# Patient Record
Sex: Female | Born: 1960 | Race: Black or African American | Hispanic: No | Marital: Married | Smoking: Never smoker
Health system: Southern US, Community
[De-identification: ages and names within clinical notes are randomized; demographics above are authoritative.]

## PROBLEM LIST (undated history)

## (undated) ENCOUNTER — Emergency Department (HOSPITAL_COMMUNITY): Disposition: A | Discharge status: Home / Self Care | Payer: 59

## (undated) DIAGNOSIS — I2699 Other pulmonary embolism without acute cor pulmonale: Secondary | ICD-10-CM

## (undated) DIAGNOSIS — I208 Other forms of angina pectoris: Secondary | ICD-10-CM

## (undated) DIAGNOSIS — R569 Unspecified convulsions: Secondary | ICD-10-CM

## (undated) DIAGNOSIS — K630 Abscess of intestine: Secondary | ICD-10-CM

## (undated) DIAGNOSIS — M5136 Other intervertebral disc degeneration, lumbar region: Secondary | ICD-10-CM

## (undated) DIAGNOSIS — Z531 Procedure and treatment not carried out because of patient's decision for reasons of belief and group pressure: Secondary | ICD-10-CM

## (undated) DIAGNOSIS — M4800 Spinal stenosis, site unspecified: Secondary | ICD-10-CM

## (undated) DIAGNOSIS — R319 Hematuria, unspecified: Secondary | ICD-10-CM

## (undated) DIAGNOSIS — R519 Headache, unspecified: Secondary | ICD-10-CM

## (undated) DIAGNOSIS — Z8719 Personal history of other diseases of the digestive system: Secondary | ICD-10-CM

## (undated) DIAGNOSIS — F419 Anxiety disorder, unspecified: Secondary | ICD-10-CM

## (undated) DIAGNOSIS — M533 Sacrococcygeal disorders, not elsewhere classified: Secondary | ICD-10-CM

## (undated) DIAGNOSIS — A4902 Methicillin resistant Staphylococcus aureus infection, unspecified site: Secondary | ICD-10-CM

## (undated) DIAGNOSIS — E119 Type 2 diabetes mellitus without complications: Secondary | ICD-10-CM

## (undated) DIAGNOSIS — I10 Essential (primary) hypertension: Secondary | ICD-10-CM

## (undated) DIAGNOSIS — R079 Chest pain, unspecified: Secondary | ICD-10-CM

## (undated) DIAGNOSIS — L4050 Arthropathic psoriasis, unspecified: Secondary | ICD-10-CM

## (undated) DIAGNOSIS — I722 Aneurysm of renal artery: Secondary | ICD-10-CM

## (undated) DIAGNOSIS — I2089 Other forms of angina pectoris: Secondary | ICD-10-CM

## (undated) DIAGNOSIS — R7303 Prediabetes: Secondary | ICD-10-CM

## (undated) DIAGNOSIS — K2970 Gastritis, unspecified, without bleeding: Secondary | ICD-10-CM

## (undated) DIAGNOSIS — M51369 Other intervertebral disc degeneration, lumbar region without mention of lumbar back pain or lower extremity pain: Secondary | ICD-10-CM

## (undated) DIAGNOSIS — R072 Precordial pain: Secondary | ICD-10-CM

## (undated) DIAGNOSIS — D649 Anemia, unspecified: Secondary | ICD-10-CM

## (undated) DIAGNOSIS — IMO0001 Reserved for inherently not codable concepts without codable children: Secondary | ICD-10-CM

## (undated) DIAGNOSIS — E041 Nontoxic single thyroid nodule: Secondary | ICD-10-CM

## (undated) HISTORY — DX: Other intervertebral disc degeneration, lumbar region without mention of lumbar back pain or lower extremity pain: M51.369

## (undated) HISTORY — PX: CARDIAC CATHETERIZATION: SHX172

## (undated) HISTORY — DX: Other forms of angina pectoris: I20.8

## (undated) HISTORY — DX: Other pulmonary embolism without acute cor pulmonale: I26.99

## (undated) HISTORY — DX: Other forms of angina pectoris: I20.89

## (undated) HISTORY — DX: Other intervertebral disc degeneration, lumbar region: M51.36

## (undated) HISTORY — DX: Spinal stenosis, site unspecified: M48.00

## (undated) HISTORY — DX: Aneurysm of renal artery: I72.2

## (undated) HISTORY — DX: Abscess of intestine: K63.0

## (undated) HISTORY — PX: JOINT REPLACEMENT: SHX530

## (undated) HISTORY — PX: TOTAL SHOULDER ARTHROPLASTY: SHX126

## (undated) HISTORY — PX: LAPAROSCOPIC OOPHERECTOMY: SHX6507

## (undated) HISTORY — PX: TUBAL LIGATION: SHX77

## (undated) HISTORY — PX: ABDOMINAL HYSTERECTOMY: SHX81

## (undated) HISTORY — PX: CHOLECYSTECTOMY: SHX55

---

## 2009-04-27 HISTORY — PX: LAPAROSCOPIC ENDOMETRIOSIS FULGURATION: SUR769

## 2012-04-27 HISTORY — PX: LAPAROSCOPIC HYSTERECTOMY: SHX1926

## 2016-04-27 HISTORY — PX: LAPAROSCOPIC GASTRIC SLEEVE RESECTION: SHX5895

## 2017-07-26 HISTORY — PX: CYSTECTOMY: SUR359

## 2017-07-27 ENCOUNTER — Encounter: Payer: Self-pay | Admitting: Rheumatology

## 2017-08-19 ENCOUNTER — Encounter: Payer: Self-pay | Admitting: Rheumatology

## 2017-10-01 DIAGNOSIS — E6609 Other obesity due to excess calories: Secondary | ICD-10-CM | POA: Diagnosis not present

## 2017-10-01 DIAGNOSIS — Z6832 Body mass index (BMI) 32.0-32.9, adult: Secondary | ICD-10-CM | POA: Diagnosis not present

## 2017-10-01 DIAGNOSIS — D473 Essential (hemorrhagic) thrombocythemia: Secondary | ICD-10-CM | POA: Diagnosis not present

## 2017-10-01 DIAGNOSIS — Z96652 Presence of left artificial knee joint: Secondary | ICD-10-CM | POA: Diagnosis not present

## 2017-10-01 DIAGNOSIS — Z13220 Encounter for screening for lipoid disorders: Secondary | ICD-10-CM | POA: Diagnosis not present

## 2017-10-01 DIAGNOSIS — L4050 Arthropathic psoriasis, unspecified: Secondary | ICD-10-CM | POA: Diagnosis not present

## 2017-10-01 DIAGNOSIS — Z9884 Bariatric surgery status: Secondary | ICD-10-CM | POA: Diagnosis not present

## 2017-10-01 DIAGNOSIS — G8929 Other chronic pain: Secondary | ICD-10-CM | POA: Diagnosis not present

## 2017-10-01 DIAGNOSIS — M25562 Pain in left knee: Secondary | ICD-10-CM | POA: Diagnosis not present

## 2017-10-13 DIAGNOSIS — I619 Nontraumatic intracerebral hemorrhage, unspecified: Secondary | ICD-10-CM | POA: Diagnosis not present

## 2017-10-13 DIAGNOSIS — R109 Unspecified abdominal pain: Secondary | ICD-10-CM | POA: Diagnosis not present

## 2017-10-13 DIAGNOSIS — R110 Nausea: Secondary | ICD-10-CM | POA: Diagnosis not present

## 2017-10-13 DIAGNOSIS — Y92410 Unspecified street and highway as the place of occurrence of the external cause: Secondary | ICD-10-CM | POA: Diagnosis not present

## 2017-10-13 DIAGNOSIS — S0083XA Contusion of other part of head, initial encounter: Secondary | ICD-10-CM | POA: Diagnosis not present

## 2017-10-13 DIAGNOSIS — Y999 Unspecified external cause status: Secondary | ICD-10-CM | POA: Diagnosis not present

## 2017-10-13 DIAGNOSIS — M25561 Pain in right knee: Secondary | ICD-10-CM | POA: Diagnosis not present

## 2017-10-13 DIAGNOSIS — R1010 Upper abdominal pain, unspecified: Secondary | ICD-10-CM | POA: Diagnosis not present

## 2017-10-13 DIAGNOSIS — R1084 Generalized abdominal pain: Secondary | ICD-10-CM | POA: Diagnosis not present

## 2017-10-13 DIAGNOSIS — R51 Headache: Secondary | ICD-10-CM | POA: Diagnosis not present

## 2017-10-13 DIAGNOSIS — R1030 Lower abdominal pain, unspecified: Secondary | ICD-10-CM | POA: Diagnosis not present

## 2017-10-13 DIAGNOSIS — I700 Atherosclerosis of aorta: Secondary | ICD-10-CM | POA: Diagnosis not present

## 2017-10-13 DIAGNOSIS — M545 Low back pain: Secondary | ICD-10-CM | POA: Diagnosis not present

## 2017-10-16 ENCOUNTER — Other Ambulatory Visit: Payer: Self-pay

## 2017-10-16 ENCOUNTER — Emergency Department (HOSPITAL_COMMUNITY): Payer: Self-pay

## 2017-10-16 ENCOUNTER — Emergency Department (HOSPITAL_COMMUNITY)
Admission: EM | Admit: 2017-10-16 | Discharge: 2017-10-16 | Disposition: A | Discharge status: Home / Self Care | Payer: Self-pay | Attending: Emergency Medicine | Admitting: Emergency Medicine

## 2017-10-16 ENCOUNTER — Encounter (HOSPITAL_COMMUNITY): Payer: Self-pay

## 2017-10-16 DIAGNOSIS — R0789 Other chest pain: Secondary | ICD-10-CM | POA: Diagnosis not present

## 2017-10-16 DIAGNOSIS — R079 Chest pain, unspecified: Secondary | ICD-10-CM | POA: Diagnosis not present

## 2017-10-16 HISTORY — DX: Arthropathic psoriasis, unspecified: L40.50

## 2017-10-16 LAB — BASIC METABOLIC PANEL
Anion gap: 7 (ref 5–15)
BUN: 13 mg/dL (ref 6–20)
CALCIUM: 9.6 mg/dL (ref 8.9–10.3)
CO2: 28 mmol/L (ref 22–32)
CREATININE: 0.76 mg/dL (ref 0.44–1.00)
Chloride: 110 mmol/L (ref 101–111)
GFR calc non Af Amer: >60 mL/min (ref >60)
Glucose, Bld: 119 mg/dL — ABNORMAL HIGH (ref 65–99)
Potassium: 4.2 mmol/L (ref 3.5–5.1)
SODIUM: 145 mmol/L (ref 135–145)

## 2017-10-16 LAB — CBC
HCT: 39.5 % (ref 36.0–46.0)
Hemoglobin: 12.2 g/dL (ref 12.0–15.0)
MCH: 28.9 pg (ref 26.0–34.0)
MCHC: 30.9 g/dL (ref 30.0–36.0)
MCV: 93.6 fL (ref 78.0–100.0)
Platelets: 320 10*3/uL (ref 150–400)
RBC: 4.22 MIL/uL (ref 3.87–5.11)
RDW: 12.9 % (ref 11.5–15.5)
WBC: 6.6 10*3/uL (ref 4.0–10.5)

## 2017-10-16 LAB — D-DIMER, QUANTITATIVE: D-Dimer, Quant: 0.79 ug/mL-FEU — ABNORMAL HIGH (ref 0.00–0.50)

## 2017-10-16 LAB — I-STAT TROPONIN, ED: TROPONIN I, POC: 0.01 ng/mL (ref 0.00–0.08)

## 2017-10-16 LAB — TROPONIN I

## 2017-10-16 IMAGING — CT CT ANGIO CHEST
3 of 9 series · 18 of 36 positions shown · IV contrast (OMNI)
Comparison: Chest x-ray [DATE], CT [DATE]

CLINICAL DATA: Left-sided chest pain with headache and dizziness

EXAM:
CT ANGIOGRAPHY CHEST WITH CONTRAST
TECHNIQUE: Multidetector CT imaging of the chest was performed using the
standard protocol during bolus administration of intravenous
contrast. Multiplanar CT image reconstructions and MIPs were
obtained to evaluate the vascular anatomy.
CONTRAST:  100mL [9J] IOPAMIDOL ([9J]) INJECTION 76%

[Series 6: thins · axial · 0.74mm/px · z∈[+1191,+1433]mm · 14 of 280 slices shown]
[im 19/280  lung]
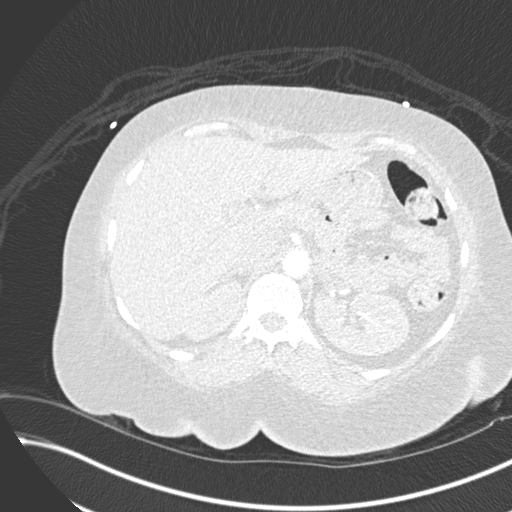
[im 38/280  mediastinal]
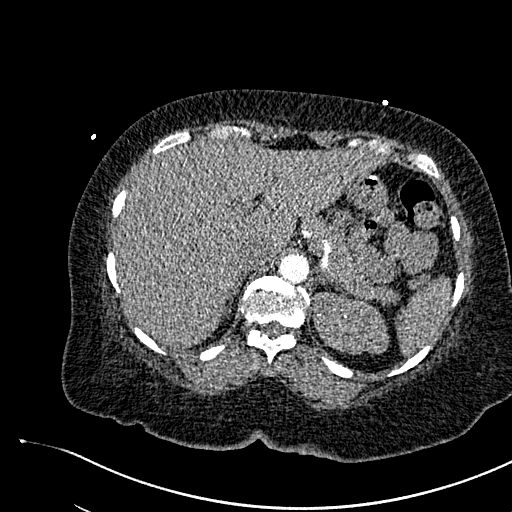
[im 56/280  lung]
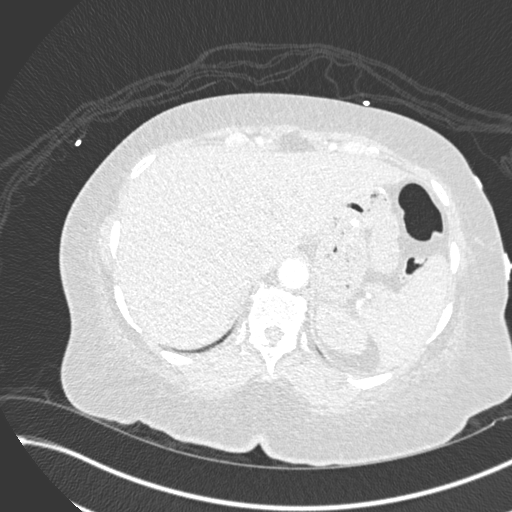
[im 75/280  mediastinal]
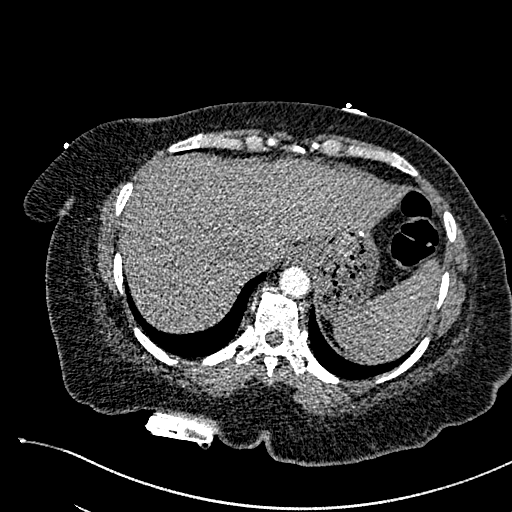
[im 94/280  lung]
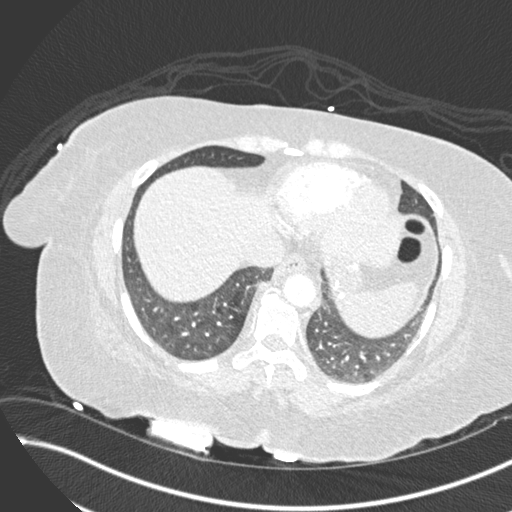
[im 112/280  mediastinal]
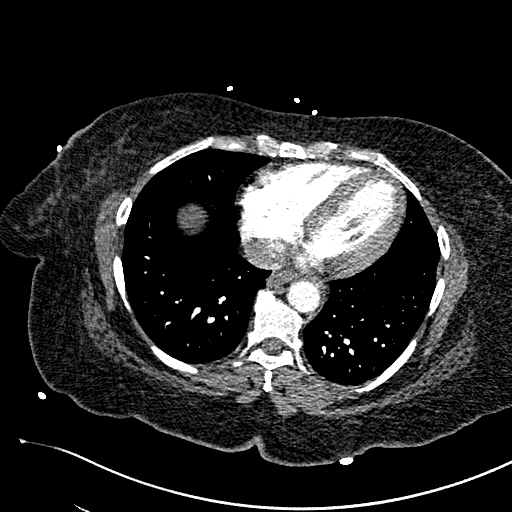
[im 131/280  lung]
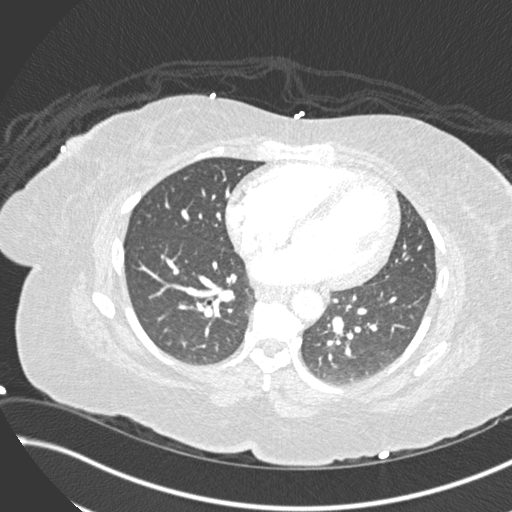
[im 149/280  mediastinal]
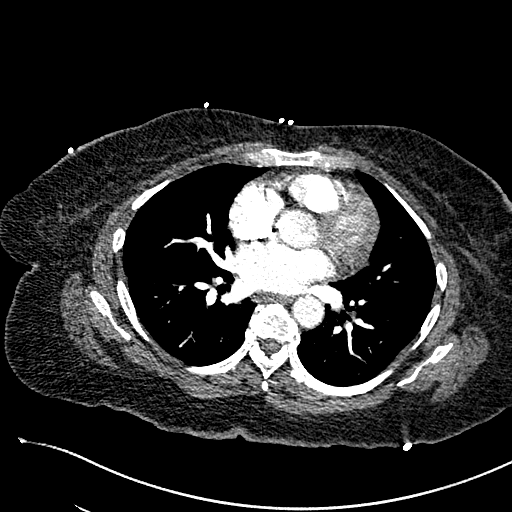
[im 168/280  lung]
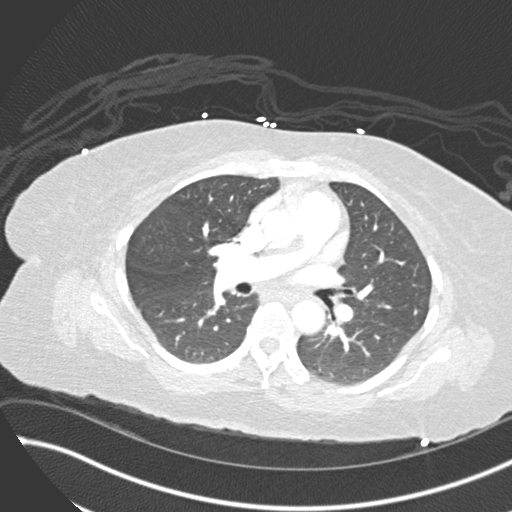
[im 187/280  mediastinal]
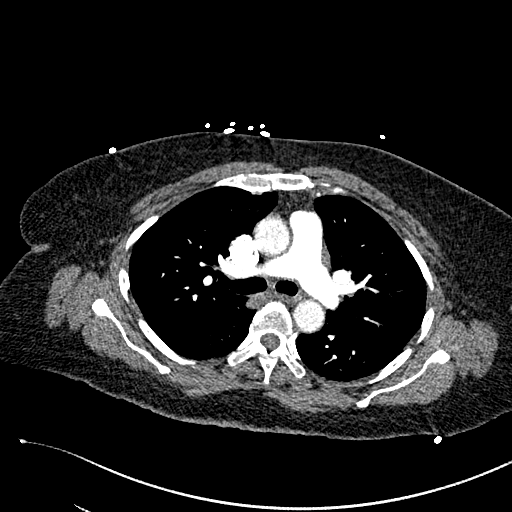
[im 205/280  lung]
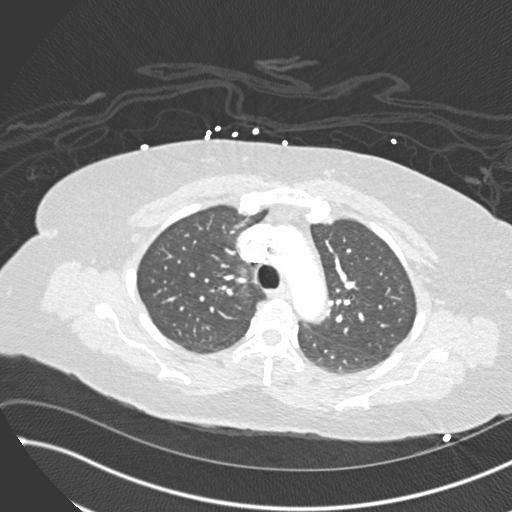
[im 224/280  mediastinal]
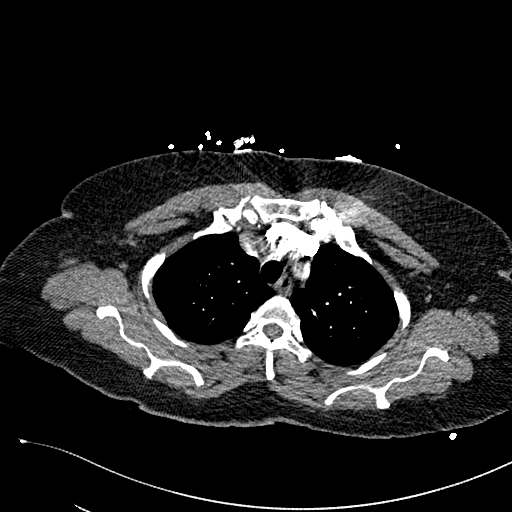
[im 242/280  lung]
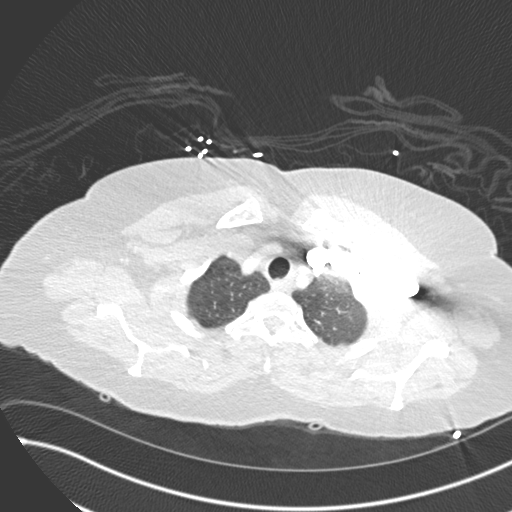
[im 261/280  mediastinal]
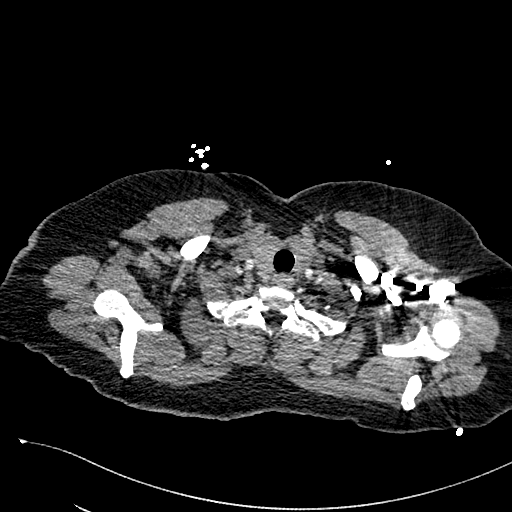

[Series 7: lung · axial · 0.74mm/px · z∈[+1254,+1386]mm · 3 of 88 slices shown]
[im 22/88  mediastinal]
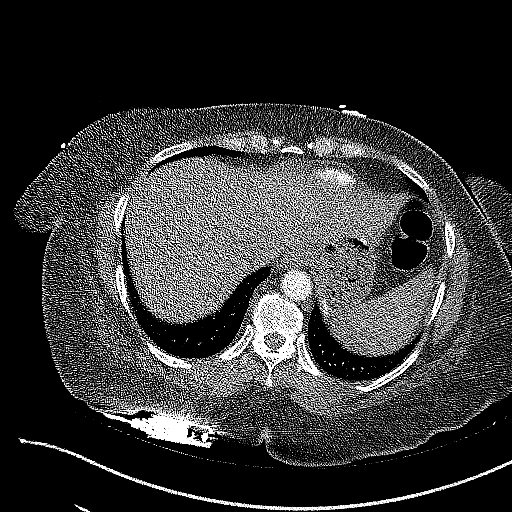
[im 44/88  mediastinal]
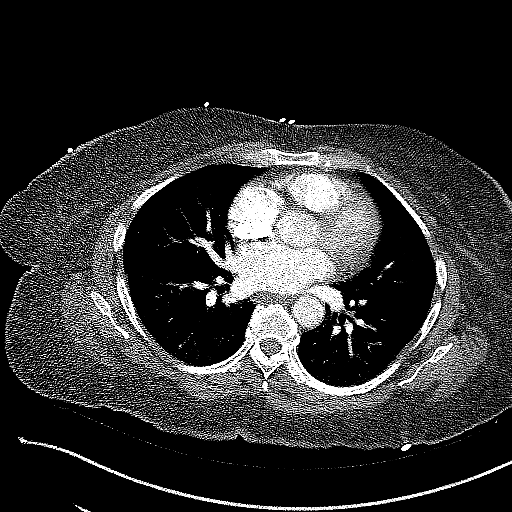
[im 66/88  mediastinal]
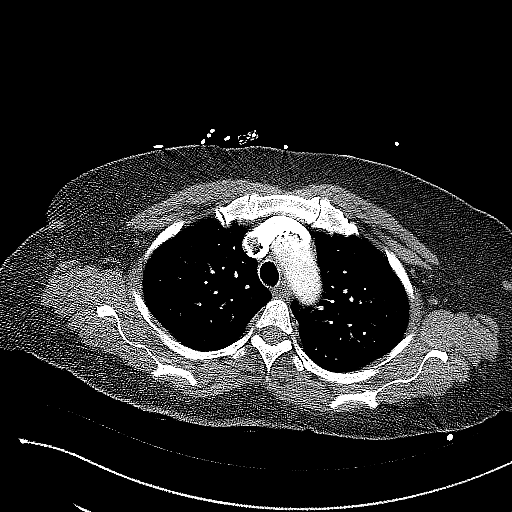

[Series 8: coronal mpr · coronal · 0.59mm/px · 1 of 146 slices shown]
[im 73/146  mediastinal]
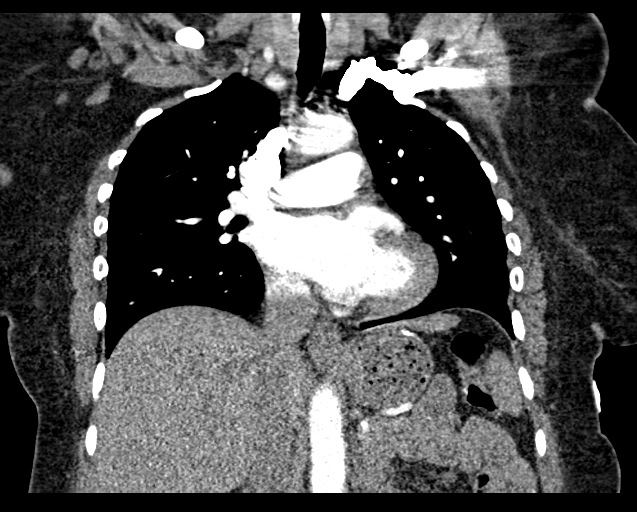

[18 of 36 positions shown; findings below may reference images not displayed]

FINDINGS: Cardiovascular: Satisfactory opacification of the pulmonary arteries
to the segmental level. No evidence of pulmonary embolism. Normal
heart size. No pericardial effusion. Nonaneurysmal aorta. No
dissection is seen.

Mediastinum/Nodes: No enlarged mediastinal, hilar, or axillary lymph
nodes. Thyroid gland, trachea, and esophagus demonstrate no
significant findings.

Lungs/Pleura: Lungs are clear. No pleural effusion or pneumothorax.
Small cyst or bleb in the left lower lobe

Upper Abdomen: Postsurgical changes of the stomach. No acute
abnormality.

Musculoskeletal: No chest wall abnormality. No acute or significant
osseous findings.

Review of the MIP images confirms the above findings.
IMPRESSION: 1. Negative for acute pulmonary embolism or aortic dissection.
2. Clear lung fields

## 2017-10-16 MED ORDER — FENTANYL CITRATE (PF) 100 MCG/2ML IJ SOLN
50.0000 ug | Freq: Once | INTRAMUSCULAR | Status: AC
  Administered 2017-10-16: 50 ug via INTRAVENOUS
  Filled 2017-10-16: qty 2

## 2017-10-16 MED ORDER — IOPAMIDOL (ISOVUE-370) INJECTION 76%
100.0000 mL | Freq: Once | INTRAVENOUS | Status: AC | PRN
  Administered 2017-10-16: 100 mL via INTRAVENOUS

## 2017-10-16 MED ORDER — KETOROLAC TROMETHAMINE 30 MG/ML IJ SOLN
15.0000 mg | Freq: Once | INTRAMUSCULAR | Status: AC
  Administered 2017-10-16: 15 mg via INTRAVENOUS
  Filled 2017-10-16: qty 1

## 2017-10-16 MED ORDER — IOHEXOL 300 MG/ML  SOLN: 100.0000 mL | Freq: Once | INTRAMUSCULAR | Status: DC | PRN

## 2017-10-16 MED ORDER — LACTATED RINGERS IV BOLUS
1000.0000 mL | Freq: Once | INTRAVENOUS | Status: AC
  Administered 2017-10-16: 1000 mL via INTRAVENOUS

## 2017-10-16 MED ORDER — IOPAMIDOL (ISOVUE-370) INJECTION 76%: 100.0000 mL | Freq: Once | INTRAVENOUS | Status: DC | PRN
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
## 2017-10-16 NOTE — ED Notes (Signed)
Patient walked to bathroom. Urine sample given.
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
## 2017-10-23 DIAGNOSIS — M47817 Spondylosis without myelopathy or radiculopathy, lumbosacral region: Secondary | ICD-10-CM | POA: Diagnosis not present

## 2017-10-23 DIAGNOSIS — M5127 Other intervertebral disc displacement, lumbosacral region: Secondary | ICD-10-CM | POA: Diagnosis not present

## 2017-10-23 DIAGNOSIS — M47816 Spondylosis without myelopathy or radiculopathy, lumbar region: Secondary | ICD-10-CM | POA: Diagnosis not present
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
## 2017-12-01 ENCOUNTER — Ambulatory Visit (INDEPENDENT_AMBULATORY_CARE_PROVIDER_SITE_OTHER): Payer: Self-pay

## 2017-12-01 ENCOUNTER — Ambulatory Visit (INDEPENDENT_AMBULATORY_CARE_PROVIDER_SITE_OTHER): Payer: Medicare HMO

## 2017-12-01 ENCOUNTER — Ambulatory Visit (INDEPENDENT_AMBULATORY_CARE_PROVIDER_SITE_OTHER): Payer: Medicare HMO | Admitting: Rheumatology

## 2017-12-01 ENCOUNTER — Encounter: Payer: Self-pay | Admitting: Rheumatology

## 2017-12-01 VITALS — BP 131/79 | HR 68 | Resp 15 | Ht 65.0 in | Wt 191.0 lb

## 2017-12-01 DIAGNOSIS — M79641 Pain in right hand: Secondary | ICD-10-CM

## 2017-12-01 DIAGNOSIS — M79672 Pain in left foot: Secondary | ICD-10-CM

## 2017-12-01 DIAGNOSIS — D75839 Thrombocytosis, unspecified: Secondary | ICD-10-CM | POA: Insufficient documentation

## 2017-12-01 DIAGNOSIS — M5136 Other intervertebral disc degeneration, lumbar region: Secondary | ICD-10-CM

## 2017-12-01 DIAGNOSIS — M79671 Pain in right foot: Secondary | ICD-10-CM

## 2017-12-01 DIAGNOSIS — Z96652 Presence of left artificial knee joint: Secondary | ICD-10-CM | POA: Insufficient documentation

## 2017-12-01 DIAGNOSIS — M79642 Pain in left hand: Secondary | ICD-10-CM

## 2017-12-01 DIAGNOSIS — L409 Psoriasis, unspecified: Secondary | ICD-10-CM | POA: Diagnosis not present

## 2017-12-01 DIAGNOSIS — L4050 Arthropathic psoriasis, unspecified: Secondary | ICD-10-CM | POA: Diagnosis not present

## 2017-12-01 DIAGNOSIS — Z79899 Other long term (current) drug therapy: Secondary | ICD-10-CM

## 2017-12-01 DIAGNOSIS — D473 Essential (hemorrhagic) thrombocythemia: Secondary | ICD-10-CM | POA: Insufficient documentation

## 2017-12-01 DIAGNOSIS — R5383 Other fatigue: Secondary | ICD-10-CM

## 2017-12-01 DIAGNOSIS — Z9884 Bariatric surgery status: Secondary | ICD-10-CM

## 2017-12-01 NOTE — Patient Instructions (Signed)
Methotrexate tablets What is this medicine? METHOTREXATE (METH oh TREX ate) is a chemotherapy drug used to treat cancer including breast cancer, leukemia, and lymphoma. This medicine can also be used to treat psoriasis and certain kinds of arthritis. This medicine may be used for other purposes; ask your health care provider or pharmacist if you have questions. COMMON BRAND NAME(S): Rheumatrex, Trexall What should I tell my health care provider before I take this medicine? They need to know if you have any of these conditions: -fluid in the stomach area or lungs -if you often drink alcohol -infection or immune system problems -kidney disease or on hemodialysis -liver disease -low blood counts, like low white cell, platelet, or red cell counts -lung disease -radiation therapy -stomach ulcers -ulcerative colitis -an unusual or allergic reaction to methotrexate, other medicines, foods, dyes, or preservatives -pregnant or trying to get pregnant -breast-feeding How should I use this medicine? Take this medicine by mouth with a glass of water. Follow the directions on the prescription label. Take your medicine at regular intervals. Do not take it more often than directed. Do not stop taking except on your doctor's advice. Make sure you know why you are taking this medicine and how often you should take it. If this medicine is used for a condition that is not cancer, like arthritis or psoriasis, it should be taken weekly, NOT daily. Taking this medicine more often than directed can cause serious side effects, even death. Talk to your healthcare provider about safe handling and disposal of this medicine. You may need to take special precautions. Talk to your pediatrician regarding the use of this medicine in children. While this drug may be prescribed for selected conditions, precautions do apply. Overdosage: If you think you have taken too much of this medicine contact a poison control center or  emergency room at once. NOTE: This medicine is only for you. Do not share this medicine with others. What if I miss a dose? If you miss a dose, talk with your doctor or health care professional. Do not take double or extra doses. What may interact with this medicine? This medicine may interact with the following medication: -acitretin -aspirin and aspirin-like medicines including salicylates -azathioprine -certain antibiotics like penicillins, tetracycline, and chloramphenicol -cyclosporine -gold -hydroxychloroquine -live virus vaccines -NSAIDs, medicines for pain and inflammation, like ibuprofen or naproxen -other cytotoxic agents -penicillamine -phenylbutazone -phenytoin -probenecid -retinoids such as isotretinoin and tretinoin -steroid medicines like prednisone or cortisone -sulfonamides like sulfasalazine and trimethoprim/sulfamethoxazole -theophylline This list may not describe all possible interactions. Give your health care provider a list of all the medicines, herbs, non-prescription drugs, or dietary supplements you use. Also tell them if you smoke, drink alcohol, or use illegal drugs. Some items may interact with your medicine. What should I watch for while using this medicine? Avoid alcoholic drinks. This medicine can make you more sensitive to the sun. Keep out of the sun. If you cannot avoid being in the sun, wear protective clothing and use sunscreen. Do not use sun lamps or tanning beds/booths. You may need blood work done while you are taking this medicine. Call your doctor or health care professional for advice if you get a fever, chills or sore throat, or other symptoms of a cold or flu. Do not treat yourself. This drug decreases your body's ability to fight infections. Try to avoid being around people who are sick. This medicine may increase your risk to bruise or bleed. Call your doctor or health care professional   if you notice any unusual bleeding. Check with your  doctor or health care professional if you get an attack of severe diarrhea, nausea and vomiting, or if you sweat a lot. The loss of too much body fluid can make it dangerous for you to take this medicine. Talk to your doctor about your risk of cancer. You may be more at risk for certain types of cancers if you take this medicine. Both men and women must use effective birth control with this medicine. Do not become pregnant while taking this medicine or until at least 1 normal menstrual cycle has occurred after stopping it. Women should inform their doctor if they wish to become pregnant or think they might be pregnant. Men should not father a child while taking this medicine and for 3 months after stopping it. There is a potential for serious side effects to an unborn child. Talk to your health care professional or pharmacist for more information. Do not breast-feed an infant while taking this medicine. What side effects may I notice from receiving this medicine? Side effects that you should report to your doctor or health care professional as soon as possible: -allergic reactions like skin rash, itching or hives, swelling of the face, lips, or tongue -breathing problems or shortness of breath -diarrhea -dry, nonproductive cough -low blood counts - this medicine may decrease the number of white blood cells, red blood cells and platelets. You may be at increased risk for infections and bleeding. -mouth sores -redness, blistering, peeling or loosening of the skin, including inside the mouth -signs of infection - fever or chills, cough, sore throat, pain or trouble passing urine -signs and symptoms of bleeding such as bloody or black, tarry stools; red or dark-brown urine; spitting up blood or brown material that looks like coffee grounds; red spots on the skin; unusual bruising or bleeding from the eye, gums, or nose -signs and symptoms of kidney injury like trouble passing urine or change in the amount  of urine -signs and symptoms of liver injury like dark yellow or brown urine; general ill feeling or flu-like symptoms; light-colored stools; loss of appetite; nausea; right upper belly pain; unusually weak or tired; yellowing of the eyes or skin Side effects that usually do not require medical attention (report to your doctor or health care professional if they continue or are bothersome): -dizziness -hair loss -tiredness -upset stomach -vomiting This list may not describe all possible side effects. Call your doctor for medical advice about side effects. You may report side effects to FDA at 1-800-FDA-1088. Where should I keep my medicine? Keep out of the reach of children. Store at room temperature between 20 and 25 degrees C (68 and 77 degrees F). Protect from light. Throw away any unused medicine after the expiration date. NOTE: This sheet is a summary. It may not cover all possible information. If you have questions about this medicine, talk to your doctor, pharmacist, or health care provider.  2018 Elsevier/Gold Standard (2014-12-17 05:39:22)  

## 2017-12-03 LAB — COMPLETE METABOLIC PANEL WITH GFR
AG RATIO: 1.8 (calc) (ref 1.0–2.5)
ALT: 50 U/L — AB (ref 6–29)
AST: 25 U/L (ref 10–35)
Albumin: 4.6 g/dL (ref 3.6–5.1)
Alkaline phosphatase (APISO): 93 U/L (ref 33–130)
BUN: 19 mg/dL (ref 7–25)
CO2: 29 mmol/L (ref 20–32)
Calcium: 9.6 mg/dL (ref 8.6–10.4)
Chloride: 106 mmol/L (ref 98–110)
Creat: 0.71 mg/dL (ref 0.50–1.05)
GFR, EST NON AFRICAN AMERICAN: 96 mL/min/{1.73_m2} (ref >=60)
GFR, Est African American: 111 mL/min/{1.73_m2} (ref >=60)
GLUCOSE: 99 mg/dL (ref 65–99)
Globulin: 2.5 g/dL (calc) (ref 1.9–3.7)
POTASSIUM: 4 mmol/L (ref 3.5–5.3)
Sodium: 144 mmol/L (ref 135–146)
Total Bilirubin: 0.4 mg/dL (ref 0.2–1.2)
Total Protein: 7.1 g/dL (ref 6.1–8.1)

## 2017-12-03 LAB — TSH: TSH: 0.92 m[IU]/L

## 2017-12-03 LAB — CBC WITH DIFFERENTIAL/PLATELET
Basophils Absolute: 29 cells/uL (ref 0–200)
Basophils Relative: 0.5 %
EOS PCT: 0.5 %
Eosinophils Absolute: 29 cells/uL (ref 15–500)
HCT: 37.8 % (ref 35.0–45.0)
Hemoglobin: 12.5 g/dL (ref 11.7–15.5)
Lymphs Abs: 1479 cells/uL (ref 850–3900)
MCH: 29.6 pg (ref 27.0–33.0)
MCHC: 33.1 g/dL (ref 32.0–36.0)
MCV: 89.6 fL (ref 80.0–100.0)
MONOS PCT: 8.1 %
MPV: 10 fL (ref 7.5–12.5)
NEUTROS PCT: 65.4 %
Neutro Abs: 3793 cells/uL (ref 1500–7800)
PLATELETS: 400 10*3/uL (ref 140–400)
RBC: 4.22 10*6/uL (ref 3.80–5.10)
RDW: 12.9 % (ref 11.0–15.0)
TOTAL LYMPHOCYTE: 25.5 %
WBC mixed population: 470 cells/uL (ref 200–950)
WBC: 5.8 10*3/uL (ref 3.8–10.8)

## 2017-12-03 LAB — URINALYSIS, ROUTINE W REFLEX MICROSCOPIC
Bilirubin Urine: NEGATIVE
Glucose, UA: NEGATIVE
HGB URINE DIPSTICK: NEGATIVE
Ketones, ur: NEGATIVE
LEUKOCYTES UA: NEGATIVE
NITRITE: NEGATIVE
Protein, ur: NEGATIVE
Specific Gravity, Urine: 1.031 (ref 1.001–1.03)

## 2017-12-03 LAB — HEPATITIS C ANTIBODY
Hepatitis C Ab: NONREACTIVE
SIGNAL TO CUT-OFF: 0.03 (ref <1.00)

## 2017-12-03 LAB — PROTEIN ELECTROPHORESIS, SERUM, WITH REFLEX
ALBUMIN ELP: 4.3 g/dL (ref 3.8–4.8)
Alpha 1: 0.4 g/dL — ABNORMAL HIGH (ref 0.2–0.3)
Alpha 2: 0.9 g/dL (ref 0.5–0.9)
Beta 2: 0.5 g/dL (ref 0.2–0.5)
Beta Globulin: 0.4 g/dL (ref 0.4–0.6)
GAMMA GLOBULIN: 0.9 g/dL (ref 0.8–1.7)
TOTAL PROTEIN: 7.3 g/dL (ref 6.1–8.1)

## 2017-12-03 LAB — IGG, IGA, IGM
IGM, SERUM: 57 mg/dL (ref 50–300)
IMMUNOGLOBULIN A: 291 mg/dL (ref 47–310)
IgG (Immunoglobin G), Serum: 952 mg/dL (ref 600–1640)

## 2017-12-03 LAB — HIV ANTIBODY (ROUTINE TESTING W REFLEX): HIV: NONREACTIVE

## 2017-12-03 LAB — CK: Total CK: 74 U/L (ref 29–143)

## 2017-12-03 LAB — HEPATITIS B CORE ANTIBODY, IGM: Hep B C IgM: NONREACTIVE

## 2017-12-03 LAB — HEPATITIS B SURFACE ANTIGEN: Hepatitis B Surface Ag: NONREACTIVE

## 2017-12-03 NOTE — Progress Notes (Signed)
Will discuss at the follow-up visit

## 2017-12-24 NOTE — Progress Notes (Deleted)
Office Visit Note  Patient: Connie Hoffman             Date of Birth: Oct 27, 1960           MRN: [id number]             PCP: Jenel Lucks, PA-C Referring: No ref. provider found Visit Date: 01/06/2018 Occupation: @GUAROCC @  Subjective:  No chief complaint on file.   History of Present Illness: Connie Hoffman is a 57 y.o. female ***   Activities of Daily Living:  Patient reports morning stiffness for *** {minute/hour:19697}.   Patient {ACTIONS;DENIES/REPORTS:21021675::"Denies"} nocturnal pain.  Difficulty dressing/grooming: {ACTIONS;DENIES/REPORTS:21021675::"Denies"} Difficulty climbing stairs: {ACTIONS;DENIES/REPORTS:21021675::"Denies"} Difficulty getting out of chair: {ACTIONS;DENIES/REPORTS:21021675::"Denies"} Difficulty using hands for taps, buttons, cutlery, and/or writing: {ACTIONS;DENIES/REPORTS:21021675::"Denies"}  No Rheumatology ROS completed.   PMFS History:  Patient Active Problem List   Diagnosis Date Noted  . Psoriasis 12/01/2017  . Psoriatic arthritis ([place]) 12/01/2017  . High risk medication use 12/01/2017  . 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 Thrombocytosis ([place]) 12/01/2017  . 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 Psoriatic arthritis ([place])     Family History  Problem Relation Age of Onset  . Diabetes Mother   . Hypertension Mother   . Heart Problems Mother   . Congestive Heart Failure Mother   . Diabetes Father   . Hypertension Father   . Stroke Sister   . Heart Problems Sister   . Prostate cancer Brother   . Diabetes Brother   . Asthma Daughter   . Healthy Daughter   . Healthy Son   . Healthy Son    Past Surgical History:  Procedure Laterality Date  . ABDOMINAL HYSTERECTOMY    . CHOLECYSTECTOMY    . CYSTECTOMY  07/2017  . JOINT REPLACEMENT Left    left knee  . [place] RESECTION  2018  . TOTAL SHOULDER ARTHROPLASTY Bilateral    Social  History   Social History Narrative  . Not on file    Objective: Vital Signs: There were no vitals taken for this visit.   Physical Exam   Musculoskeletal Exam: ***  CDAI Exam: CDAI Score: Not documented Patient Global Assessment: Not documented; Provider Global Assessment: Not documented Swollen: Not documented; Tender: Not documented Joint Exam   Not documented   There is currently no information documented on the homunculus. Go to the Rheumatology activity and complete the homunculus joint exam.  Investigation: No additional findings.  Imaging: Xr Foot 2 Views Left  Result Date: 12/01/2017 First MTP, all PIP and DIP narrowing was noted.  No erosive changes were noted.  No intertarsal joint space narrowing or tibiotalar joint space narrowing was noted.  A small calcaneal spur was noted. Impression: These findings are consistent with osteoarthritis of the foot.  Xr Foot 2 Views Right  Result Date: 12/01/2017 First MTP, all PIP and DIP narrowing was noted.  No erosive changes were noted.  No intertarsal joint space narrowing or tibiotalar joint space narrowing was noted.  A small calcaneal spur was noted. Impression: These findings are consistent with osteoarthritis of the foot.  Xr Hand 2 View Left  Result Date: 12/01/2017 Minimal PIP and DIP narrowing was noted.  No MCP, intercarpal or radiocarpal joint space narrowing was noted.  No erosive changes were noted. Impression: These findings are consistent with osteoarthritis of the hand.  Xr Hand 2 View Right  Result Date: 12/01/2017 [place], PIP and DIP narrowing  was noted.  No MCP, intercarpal or radiocarpal joint space narrowing was noted.  No erosive changes were noted. Impression: These findings are consistent with osteoarthritis of the hand.   Recent Labs: Lab Results  Component Value Date   WBC 5.8 12/01/2017   HGB 12.5 12/01/2017   PLT 400 12/01/2017   NA 144 12/01/2017   K 4.0 12/01/2017   CL 106 12/01/2017   CO2  29 12/01/2017   GLUCOSE 99 12/01/2017   BUN 19 12/01/2017   CREATININE 0.71 12/01/2017   BILITOT 0.4 12/01/2017   AST 25 12/01/2017   ALT 50 (H) 12/01/2017   PROT 7.1 12/01/2017   PROT 7.3 12/01/2017   CALCIUM 9.6 12/01/2017   GFRAA 111 12/01/2017  UA negative, SPEP nonspecific, immunoglobulins normal, hepatitis B-, hepatitis C negative, HIV negative, TSH normal, CK normal  Speciality Comments: No specialty comments available.  Procedures:  No procedures performed Allergies: Aspirin   Assessment / Plan:     Visit Diagnoses: No diagnosis found.   Orders: No orders of the defined types were placed in this encounter.  No orders of the defined types were placed in this encounter.   Face-to-face time spent with patient was *** minutes. Greater than 50% of time was spent in counseling and coordination of care.  Follow-Up Instructions: No follow-ups on file.   Bo Merino, MD  Note - This record has been created using Editor, commissioning.  Chart creation errors have been sought, but may not always  have been located. Such creation errors do not reflect on  the standard of medical care.

## 2017-12-26 HISTORY — PX: REVISION TOTAL KNEE ARTHROPLASTY: SHX767

## 2018-01-06 ENCOUNTER — Ambulatory Visit: Payer: Self-pay | Admitting: Rheumatology

## 2018-01-07 NOTE — Progress Notes (Signed)
Office Visit Note  Patient: Connie Hoffman             Date of Birth: 1960-08-18           MRN: [id number]             PCP: Jenel Lucks, PA-C Referring: Burna Cash* Visit Date: 01/21/2018 Occupation: @GUAROCC @  Subjective:  Pain in hands and left knee joint.  History of Present Illness: Connie Hoffman is a 57 y.o. female with history of psoriatic arthritis, psoriasis and osteoarthritis.  She states she underwent left knee joint revision in August 2019 and then manipulation of the knee joint last week.  She still having discomfort in her left knee joint.  She has been experiencing discomfort in her bilateral hands and wrist.  She had to come off methotrexate during the time of surgery for weeks..  She resumed methotrexate right after the surgery.  She started having a flare while she was off methotrexate.  Activities of Daily Living:  Patient reports morning stiffness for 20-30 minutes.   Patient Reports nocturnal pain.  Difficulty dressing/grooming: Denies Difficulty climbing stairs: Reports Difficulty getting out of chair: Reports Difficulty using hands for taps, buttons, cutlery, and/or writing: Denies  Review of Systems  Constitutional: Positive for fatigue. Negative for night sweats, weight gain and weight loss.  HENT: Positive for mouth dryness. Negative for mouth sores, trouble swallowing, trouble swallowing and nose dryness.   Eyes: Positive for dryness. Negative for pain, redness and visual disturbance.  Respiratory: Negative for cough, hemoptysis, shortness of breath and difficulty breathing.   Cardiovascular: Negative for chest pain, palpitations, hypertension, irregular heartbeat and swelling in legs/feet.  Gastrointestinal: Positive for constipation. Negative for blood in stool and diarrhea.  Endocrine: Negative for increased urination.  Genitourinary: Negative for painful urination and vaginal dryness.  Musculoskeletal: Positive for  arthralgias, joint pain, joint swelling and morning stiffness. Negative for myalgias, muscle weakness, muscle tenderness and myalgias.  Skin: Positive for rash. Negative for color change, pallor, hair loss, nodules/bumps, skin tightness, ulcers and sensitivity to sunlight.  Allergic/Immunologic: Negative for susceptible to infections.  Neurological: Negative for dizziness, numbness, headaches, memory loss, night sweats and weakness.  Hematological: Negative for swollen glands.  Psychiatric/Behavioral: Positive for sleep disturbance. Negative for depressed mood. The patient is not nervous/anxious.     PMFS History:  Patient Active Problem List   Diagnosis Date Noted  . DDD (degenerative disc disease), lumbar 01/21/2018  . Primary osteoarthritis of both hands 01/21/2018  . Primary osteoarthritis of both feet 01/21/2018  . Psoriasis 12/01/2017  . Psoriatic arthritis ([place]) 12/01/2017  . High risk medication use 12/01/2017  . 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 Thrombocytosis ([place]) 12/01/2017  . 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 Psoriatic arthritis ([place])     Family History  Problem Relation Age of Onset  . Diabetes Mother   . Hypertension Mother   . Heart Problems Mother   . Congestive Heart Failure Mother   . Diabetes Father   . Hypertension Father   . Stroke Sister   . Heart Problems Sister   . Prostate cancer Brother   . Diabetes Brother   . Asthma Daughter   . Healthy Daughter   . Healthy Son   . Healthy Son    Past Surgical History:  Procedure Laterality Date  . ABDOMINAL HYSTERECTOMY    . CHOLECYSTECTOMY    . CYSTECTOMY  07/2017  .  JOINT REPLACEMENT Left    left knee  . [place] RESECTION  2018  . REVISION TOTAL KNEE ARTHROPLASTY Left 12/2017  . TOTAL SHOULDER ARTHROPLASTY Bilateral    Social History   Social History Narrative  . Not on file    Objective: Vital Signs: BP (!)  147/74 (BP Location: Right Arm, Patient Position: Sitting, Cuff Size: Normal)   Pulse 68   Resp 15   Ht 5\' 5"  (1.651 m)   Wt 185 lb (83.9 kg)   BMI 30.79 kg/m    Physical Exam  Constitutional: She is oriented to person, place, and time. She appears well-developed and well-nourished.  HENT:  Head: Normocephalic and atraumatic.  Eyes: Conjunctivae and EOM are normal.  Neck: Normal range of motion.  Cardiovascular: Normal rate, regular rhythm, normal heart sounds and intact distal pulses.  Pulmonary/Chest: Effort normal and breath sounds normal.  Abdominal: Soft. Bowel sounds are normal.  Lymphadenopathy:    She has no cervical adenopathy.  Neurological: She is alert and oriented to person, place, and time.  Skin: Skin is warm and dry. Capillary refill takes less than 2 seconds.  Psychiatric: She has a normal mood and affect. Her behavior is normal.  Nursing note and vitals reviewed.    Musculoskeletal Exam:   CDAI Exam: CDAI Score: 3  Patient Global Assessment: 5 (mm); Provider Global Assessment: 5 (mm) Swollen: 1 ; Tender: 1  Joint Exam      Right  Left  Knee     Swollen Tender     Investigation: No additional findings.  Imaging: No results found.  Recent Labs: Lab Results  Component Value Date   WBC 5.8 12/01/2017   HGB 12.5 12/01/2017   PLT 400 12/01/2017   NA 144 12/01/2017   K 4.0 12/01/2017   CL 106 12/01/2017   CO2 29 12/01/2017   GLUCOSE 99 12/01/2017   BUN 19 12/01/2017   CREATININE 0.71 12/01/2017   BILITOT 0.4 12/01/2017   AST 25 12/01/2017   ALT 50 (H) 12/01/2017   PROT 7.1 12/01/2017   PROT 7.3 12/01/2017   CALCIUM 9.6 12/01/2017   GFRAA 111 12/01/2017  UA negative, SPEP nonspecific, immunoglobulins normal, hepatitis B-, hepatitis C negative, HIV negative, CK normal, TSH normal  Speciality Comments: No specialty comments available.  Procedures:  No procedures performed Allergies: Aspirin   Assessment / Plan:     Visit Diagnoses:  Psoriatic arthritis (HCC)-reports that she had a flare when she came off the methotrexate.  She still continues to have some arthralgias.  She had no synovitis on examination.  She has some swelling in her left knee joint where she is recovering from her recent surgery.  Psoriasis-she had mild erythema but no active psoriasis lesions.  High risk medication use - mtx6 tablets p.o. weekly per rheumatologist in [place].  Her LFTs are mildly elevated.  We discussed possible use of Humira in future.  Handout was given.  S/P total knee replacement, left - Left knee joint revision December 06, 2017, September 2019 manipulation.  She still has discomfort warmth and swelling.  DDD (degenerative disc disease), lumbar - with spinal stenosis-patient reports that she has appointment coming up with the back specialist.  Primary osteoarthritis of both hands-joint protection muscle strengthening was discussed.  Primary osteoarthritis of both feet-she is currently not having much discomfort.  Thrombocytosis ([place])  History of gastric bypass   Orders: No orders of the defined types were placed in this encounter.  No orders  of the defined types were placed in this encounter.   Face-to-face time spent with patient was 30 minutes. Greater than 50% of time was spent in counseling and coordination of care.  Follow-Up Instructions: Return in about 3 months (around 04/22/2018) for Psoriatic arthritis, Ps, OA.   Bo Merino, MD  Note - This record has been created using Editor, commissioning.  Chart creation errors have been sought, but may not always  have been located. Such creation errors do not reflect on  the standard of medical care.

## 2018-01-18 ENCOUNTER — Telehealth: Payer: Self-pay | Admitting: Pharmacy Technician

## 2018-01-18 NOTE — Telephone Encounter (Signed)
Received a Prior Authorization request from [employer] for Humira. Authorization has been submitted to patient's insurance via Cover My Meds. Will update once we receive a response.  2:40 PM Beatriz Chancellor, CPhT

## 2018-01-19 NOTE — Telephone Encounter (Signed)
Received a fax from [facility] regarding a prior authorization for Humira. Authorization has been APPROVED from 04/25/17 to 04/26/18.   Will send document to scan center.  Authorization #[id number] Phone # [phone]  8:17 AM Beatriz Chancellor, CPhT

## 2018-01-21 ENCOUNTER — Ambulatory Visit: Payer: Medicare HMO | Admitting: Rheumatology

## 2018-01-21 ENCOUNTER — Encounter: Payer: Self-pay | Admitting: Rheumatology

## 2018-01-21 VITALS — BP 147/74 | HR 68 | Resp 15 | Ht 65.0 in | Wt 185.0 lb

## 2018-01-21 DIAGNOSIS — D75839 Thrombocytosis, unspecified: Secondary | ICD-10-CM

## 2018-01-21 DIAGNOSIS — M19041 Primary osteoarthritis, right hand: Secondary | ICD-10-CM | POA: Insufficient documentation

## 2018-01-21 DIAGNOSIS — Z96652 Presence of left artificial knee joint: Secondary | ICD-10-CM | POA: Diagnosis not present

## 2018-01-21 DIAGNOSIS — L409 Psoriasis, unspecified: Secondary | ICD-10-CM

## 2018-01-21 DIAGNOSIS — Z79899 Other long term (current) drug therapy: Secondary | ICD-10-CM

## 2018-01-21 DIAGNOSIS — Z9884 Bariatric surgery status: Secondary | ICD-10-CM

## 2018-01-21 DIAGNOSIS — L4050 Arthropathic psoriasis, unspecified: Secondary | ICD-10-CM

## 2018-01-21 DIAGNOSIS — M19072 Primary osteoarthritis, left ankle and foot: Secondary | ICD-10-CM

## 2018-01-21 DIAGNOSIS — M51369 Other intervertebral disc degeneration, lumbar region without mention of lumbar back pain or lower extremity pain: Secondary | ICD-10-CM

## 2018-01-21 DIAGNOSIS — M19042 Primary osteoarthritis, left hand: Secondary | ICD-10-CM

## 2018-01-21 DIAGNOSIS — M5136 Other intervertebral disc degeneration, lumbar region: Secondary | ICD-10-CM

## 2018-01-21 DIAGNOSIS — M19071 Primary osteoarthritis, right ankle and foot: Secondary | ICD-10-CM

## 2018-01-21 DIAGNOSIS — D473 Essential (hemorrhagic) thrombocythemia: Secondary | ICD-10-CM

## 2018-01-21 NOTE — Patient Instructions (Addendum)
Standing Labs We placed an order today for your standing lab work.    Please come back and get your standing labs in November and every 3 months  We have open lab Monday through Friday from 8:30-11:30 AM and 1:30-4:00 PM  at the office of Dr. Bo Merino.   You may experience shorter wait times on Monday and Friday afternoons. The office is located at [address] [place], [place], [place] [address] No appointment is necessary.   Labs are drawn by [facility].  You may receive a bill from [facility] for your lab work. If you have any questions regarding directions or hours of operation,  please call [phone].   Just as a reminder please drink plenty of water prior to coming for your lab work. Thanks!    Adalimumab Injection What is this medicine? ADALIMUMAB (a dal AYE mu mab) is used to treat rheumatoid and psoriatic arthritis. It is also used to treat ankylosing spondylitis, Crohn's disease, ulcerative colitis, plaque psoriasis, hidradenitis suppurativa, and uveitis. This medicine may be used for other purposes; ask your health care provider or pharmacist if you have questions. COMMON BRAND NAME(S): CYLTEZO, Humira What should I tell my health care provider before I take this medicine? They need to know if you have any of these conditions: -diabetes -heart disease -hepatitis B or history of hepatitis B infection -immune system problems -infection or history of infections -multiple sclerosis -recently received or scheduled to receive a vaccine -scheduled to have surgery -tuberculosis, a positive skin test for tuberculosis or have recently been in close contact with someone who has tuberculosis -an unusual reaction to adalimumab, other medicines, mannitol, latex, rubber, foods, dyes, or preservatives -pregnant or trying to get pregnant -breast-feeding How should I use this medicine? This medicine is for injection under the skin. You will be taught how to prepare and give  this medicine. Use exactly as directed. Take your medicine at regular intervals. Do not take your medicine more often than directed. A special MedGuide will be given to you by the pharmacist with each prescription and refill. Be sure to read this information carefully each time. It is important that you put your used needles and syringes in a special sharps container. Do not put them in a trash can. If you do not have a sharps container, call your pharmacist or healthcare provider to get one. Talk to your pediatrician regarding the use of this medicine in children. While this drug may be prescribed for children as young as 2 years for selected conditions, precautions do apply. The manufacturer of the medicine offers free information to patients and their health care partners. Call 2143913181 for more information. Overdosage: If you think you have taken too much of this medicine contact a poison control center or emergency room at once. NOTE: This medicine is only for you. Do not share this medicine with others. What if I miss a dose? If you miss a dose, take it as soon as you can. If it is almost time for your next dose, take only that dose. Do not take double or extra doses. Give the next dose when your next scheduled dose is due. Call your doctor or health care professional if you are not sure how to handle a missed dose. What may interact with this medicine? Do not take this medicine with any of the following medications: -abatacept -anakinra -etanercept -infliximab -live virus vaccines -rilonacept This medicine may also interact with the following medications: -vaccines This list may not  describe all possible interactions. Give your health care provider a list of all the medicines, herbs, non-prescription drugs, or dietary supplements you use. Also tell them if you smoke, drink alcohol, or use illegal drugs. Some items may interact with your medicine. What should I watch for while using  this medicine? Visit your doctor or health care professional for regular checks on your progress. Tell your doctor or healthcare professional if your symptoms do not start to get better or if they get worse. You will be tested for tuberculosis (TB) before you start this medicine. If your doctor prescribes any medicine for TB, you should start taking the TB medicine before starting this medicine. Make sure to finish the full course of TB medicine. Call your doctor or health care professional if you get a cold or other infection while receiving this medicine. Do not treat yourself. This medicine may decrease your body's ability to fight infection. Talk to your doctor about your risk of cancer. You may be more at risk for certain types of cancers if you take this medicine. What side effects may I notice from receiving this medicine? Side effects that you should report to your doctor or health care professional as soon as possible: -allergic reactions like skin rash, itching or hives, swelling of the face, lips, or tongue -breathing problems -changes in vision -chest pain -fever, chills, or any other sign of infection -numbness or tingling -red, scaly patches or raised bumps on the skin -swelling of the ankles -swollen lymph nodes in the neck, underarm, or groin areas -unexplained weight loss -unusual bleeding or bruising -unusually weak or tired Side effects that usually do not require medical attention (report to your doctor or health care professional if they continue or are bothersome): -headache -nausea -redness, itching, swelling, or bruising at site where injected This list may not describe all possible side effects. Call your doctor for medical advice about side effects. You may report side effects to FDA at 1-800-FDA-1088. Where should I keep my medicine? Keep out of the reach of children. Store in the original container and in the refrigerator between 2 and 8 degrees C (36 and 46  degrees F). Do not freeze. The product may be stored in a cool carrier with an ice pack, if needed. Protect from light. Throw away any unused medicine after the expiration date. NOTE: This sheet is a summary. It may not cover all possible information. If you have questions about this medicine, talk to your doctor, pharmacist, or health care provider.  2018 Elsevier/Gold Standard (2014-10-31 11:11:43)

## 2018-02-15 ENCOUNTER — Other Ambulatory Visit: Payer: Self-pay | Admitting: Rheumatology

## 2018-02-15 NOTE — Telephone Encounter (Signed)
Last visit: 01/21/2018 Next visit: 04/22/2018 Labs: 12/01/2017 ALT 50.  Okay to refill MTX 6 tabs weekly?

## 2018-02-15 NOTE — Telephone Encounter (Signed)
ok 

## 2018-02-16 ENCOUNTER — Telehealth: Payer: Self-pay | Admitting: Rheumatology

## 2018-02-16 NOTE — Telephone Encounter (Signed)
Patient needs refill on MTX sent to [facility] [address] [place] in [facility]. Patient is due for dose today.

## 2018-02-16 NOTE — Telephone Encounter (Signed)
Patient advised prescription for MTX has been sent to the pharmacy. Patient states she has had a fever for the last 3 nights. Patient states she has woken up with sweats and chills the last 3 nights. Patient states she is having some pain in her hand and redness in her left eye. Patient states she is unsure if she should see [place] or her PCP. Patient advised to contact PCP for evaluation. Patient advised to contact us if pain in hand persists after seeing PCP. Patient verbalized understanding.

## 2018-03-04 ENCOUNTER — Telehealth: Payer: Self-pay | Admitting: Rheumatology

## 2018-03-04 NOTE — Telephone Encounter (Signed)
Patient called requesting prescription refill of Naproxen to be [facility] in [facility].

## 2018-03-07 ENCOUNTER — Telehealth: Payer: Self-pay | Admitting: Rheumatology

## 2018-03-07 NOTE — Telephone Encounter (Signed)
Patient has transferred care from [place]. Per Office note 01/21/18, High risk medication use - mtx6 tablets p.o. weekly per rheumatologist in [place].  Her LFTs are mildly elevated.  We discussed possible use of Humira in future.  Patient states she was receiving Naproxen from her rheumatologist in [place]. Patient is requesting a refill of Naproxen from [place].  Last Visit: 01/21/18 Next Visit: 04/22/18

## 2018-03-07 NOTE — Telephone Encounter (Signed)
Patient called requesting a return call to let her know if she should continue taking the Omeprazole.

## 2018-03-07 NOTE — Telephone Encounter (Signed)
Naproxen can cause further elevation of her LFTs.  We do not recommend use of naproxen with methotrexate.  You may discuss use of natural anti-inflammatories with her.

## 2018-03-07 NOTE — Telephone Encounter (Signed)
Spoke with patient and advised per Dr. Estanislado Pandy Naproxen can cause further elevation of her LFTs.  We do not recommend use of naproxen with methotrexate.  Discussed use of natural anti-inflammatories with her.

## 2018-03-08 ENCOUNTER — Telehealth: Payer: Self-pay | Admitting: Rheumatology

## 2018-03-08 NOTE — Telephone Encounter (Signed)
Patient called stating she was returning your call.   °

## 2018-03-08 NOTE — Telephone Encounter (Signed)
Attempted to contact the patient and left message for patient to call the office.  

## 2018-03-08 NOTE — Telephone Encounter (Signed)
Patient states she was also given a prescription for Omeprazole while on Naproxen from previous rheumatologist. Patient asked if she should continue the Omeprazole. Patient advised that she should not need to take the medication as we have discontinued the naproxen. Patient advised if she developed any symptoms of GERD she would need to contact her PCP.

## 2018-03-09 ENCOUNTER — Other Ambulatory Visit: Payer: Self-pay | Admitting: *Deleted

## 2018-03-09 DIAGNOSIS — Z79899 Other long term (current) drug therapy: Secondary | ICD-10-CM

## 2018-03-10 LAB — COMPLETE METABOLIC PANEL WITH GFR
AG Ratio: 1.6 (calc) (ref 1.0–2.5)
ALT: 19 U/L (ref 6–29)
AST: 15 U/L (ref 10–35)
Albumin: 4.1 g/dL (ref 3.6–5.1)
Alkaline phosphatase (APISO): 101 U/L (ref 33–130)
BUN: 14 mg/dL (ref 7–25)
CO2: 31 mmol/L (ref 20–32)
Calcium: 9.4 mg/dL (ref 8.6–10.4)
Chloride: 109 mmol/L (ref 98–110)
Creat: 0.64 mg/dL (ref 0.50–1.05)
GFR, EST AFRICAN AMERICAN: 116 mL/min/{1.73_m2} (ref >=60)
GFR, Est Non African American: 100 mL/min/{1.73_m2} (ref >=60)
Globulin: 2.6 g/dL (calc) (ref 1.9–3.7)
Glucose, Bld: 80 mg/dL (ref 65–99)
Potassium: 4.8 mmol/L (ref 3.5–5.3)
Sodium: 147 mmol/L — ABNORMAL HIGH (ref 135–146)
TOTAL PROTEIN: 6.7 g/dL (ref 6.1–8.1)
Total Bilirubin: 0.2 mg/dL (ref 0.2–1.2)

## 2018-03-10 LAB — CBC WITH DIFFERENTIAL/PLATELET
BASOS PCT: 0.6 %
Basophils Absolute: 28 cells/uL (ref 0–200)
Eosinophils Absolute: 87 cells/uL (ref 15–500)
Eosinophils Relative: 1.9 %
HCT: 35.4 % (ref 35.0–45.0)
HEMOGLOBIN: 11.8 g/dL (ref 11.7–15.5)
Lymphs Abs: 1458 cells/uL (ref 850–3900)
MCH: 29.5 pg (ref 27.0–33.0)
MCHC: 33.3 g/dL (ref 32.0–36.0)
MCV: 88.5 fL (ref 80.0–100.0)
MONOS PCT: 10.2 %
MPV: 10.6 fL (ref 7.5–12.5)
NEUTROS ABS: 2558 {cells}/uL (ref 1500–7800)
Neutrophils Relative %: 55.6 %
PLATELETS: 331 10*3/uL (ref 140–400)
RBC: 4 10*6/uL (ref 3.80–5.10)
RDW: 13.2 % (ref 11.0–15.0)
TOTAL LYMPHOCYTE: 31.7 %
WBC mixed population: 469 cells/uL (ref 200–950)
WBC: 4.6 10*3/uL (ref 3.8–10.8)

## 2018-04-08 NOTE — Progress Notes (Deleted)
Office Visit Note  Patient: Connie Hoffman             Date of Birth: June 18, 1960           MRN: [id number]             PCP: Jenel Lucks, PA-C Referring: Burna Cash* Visit Date: 04/22/2018 Occupation: @GUAROCC @  Subjective:  No chief complaint on file.   History of Present Illness: Connie Hoffman is a 57 y.o. female ***   Activities of Daily Living:  Patient reports morning stiffness for *** {minute/hour:19697}.   Patient {ACTIONS;DENIES/REPORTS:21021675::"Denies"} nocturnal pain.  Difficulty dressing/grooming: {ACTIONS;DENIES/REPORTS:21021675::"Denies"} Difficulty climbing stairs: {ACTIONS;DENIES/REPORTS:21021675::"Denies"} Difficulty getting out of chair: {ACTIONS;DENIES/REPORTS:21021675::"Denies"} Difficulty using hands for taps, buttons, cutlery, and/or writing: {ACTIONS;DENIES/REPORTS:21021675::"Denies"}  No Rheumatology ROS completed.   PMFS History:  Patient Active Problem List   Diagnosis Date Noted  . DDD (degenerative disc disease), lumbar 01/21/2018  . Primary osteoarthritis of both hands 01/21/2018  . Primary osteoarthritis of both feet 01/21/2018  . Psoriasis 12/01/2017  . Psoriatic arthritis ([place]) 12/01/2017  . High risk medication use 12/01/2017  . 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 Thrombocytosis ([place]) 12/01/2017  . 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 Psoriatic arthritis ([place])     Family History  Problem Relation Age of Onset  . Diabetes Mother   . Hypertension Mother   . Heart Problems Mother   . Congestive Heart Failure Mother   . Diabetes Father   . Hypertension Father   . Stroke Sister   . Heart Problems Sister   . Prostate cancer Brother   . Diabetes Brother   . Asthma Daughter   . Healthy Daughter   . Healthy Son   . Healthy Son    Past Surgical History:  Procedure Laterality Date  . ABDOMINAL HYSTERECTOMY    . CHOLECYSTECTOMY    .  CYSTECTOMY  07/2017  . JOINT REPLACEMENT Left    left knee  . [place] RESECTION  2018  . REVISION TOTAL KNEE ARTHROPLASTY Left 12/2017  . TOTAL SHOULDER ARTHROPLASTY Bilateral    Social History   Social History Narrative  . Not on file    Objective: Vital Signs: There were no vitals taken for this visit.   Physical Exam   Musculoskeletal Exam: ***  CDAI Exam: CDAI Score: Not documented Patient Global Assessment: Not documented; Provider Global Assessment: Not documented Swollen: Not documented; Tender: Not documented Joint Exam   Not documented   There is currently no information documented on the homunculus. Go to the Rheumatology activity and complete the homunculus joint exam.  Investigation: No additional findings.  Imaging: No results found.  Recent Labs: Lab Results  Component Value Date   WBC 4.6 03/09/2018   HGB 11.8 03/09/2018   PLT 331 03/09/2018   NA 147 (H) 03/09/2018   K 4.8 03/09/2018   CL 109 03/09/2018   CO2 31 03/09/2018   GLUCOSE 80 03/09/2018   BUN 14 03/09/2018   CREATININE 0.64 03/09/2018   BILITOT 0.2 03/09/2018   AST 15 03/09/2018   ALT 19 03/09/2018   PROT 6.7 03/09/2018   CALCIUM 9.4 03/09/2018   GFRAA 116 03/09/2018    Speciality Comments: No specialty comments available.  Procedures:  No procedures performed Allergies: Aspirin   Assessment / Plan:     Visit Diagnoses: Psoriatic arthritis ([place])  Psoriasis  High risk medication use -  mtx6 tablets p.o. weekly  per rheumatologist in [place].  Her LFTs are mildly elevated.  We discussed possible use of Humira in future  S/P total knee replacement, left  DDD (degenerative disc disease), lumbar  Primary osteoarthritis of both hands  Primary osteoarthritis of both feet  Thrombocytosis ([place])  History of gastric bypass   Orders: No orders of the defined types were placed in this encounter.  No orders of the defined types were placed in this  encounter.   Face-to-face time spent with patient was *** minutes. Greater than 50% of time was spent in counseling and coordination of care.  Follow-Up Instructions: No follow-ups on file.   Ofilia Neas, PA-C  Note - This record has been created using Dragon software.  Chart creation errors have been sought, but may not always  have been located. Such creation errors do not reflect on  the standard of medical care.

## 2018-04-13 HISTORY — PX: LUMBAR EPIDURAL INJECTION: SHX1980

## 2018-04-15 ENCOUNTER — Telehealth: Payer: Self-pay | Admitting: Rheumatology

## 2018-04-15 ENCOUNTER — Telehealth: Payer: Self-pay | Admitting: Pharmacist

## 2018-04-15 ENCOUNTER — Ambulatory Visit (INDEPENDENT_AMBULATORY_CARE_PROVIDER_SITE_OTHER): Payer: MEDICARE | Admitting: Rheumatology

## 2018-04-15 ENCOUNTER — Encounter: Payer: Self-pay | Admitting: Physician Assistant

## 2018-04-15 VITALS — BP 117/68 | HR 72 | Resp 13 | Ht 65.0 in | Wt 195.0 lb

## 2018-04-15 DIAGNOSIS — M5136 Other intervertebral disc degeneration, lumbar region: Secondary | ICD-10-CM

## 2018-04-15 DIAGNOSIS — M19071 Primary osteoarthritis, right ankle and foot: Secondary | ICD-10-CM

## 2018-04-15 DIAGNOSIS — L4050 Arthropathic psoriasis, unspecified: Secondary | ICD-10-CM | POA: Diagnosis not present

## 2018-04-15 DIAGNOSIS — D75839 Thrombocytosis, unspecified: Secondary | ICD-10-CM

## 2018-04-15 DIAGNOSIS — Z79899 Other long term (current) drug therapy: Secondary | ICD-10-CM

## 2018-04-15 DIAGNOSIS — M19072 Primary osteoarthritis, left ankle and foot: Secondary | ICD-10-CM

## 2018-04-15 DIAGNOSIS — Z96652 Presence of left artificial knee joint: Secondary | ICD-10-CM

## 2018-04-15 DIAGNOSIS — L409 Psoriasis, unspecified: Secondary | ICD-10-CM

## 2018-04-15 DIAGNOSIS — M19042 Primary osteoarthritis, left hand: Secondary | ICD-10-CM

## 2018-04-15 DIAGNOSIS — D473 Essential (hemorrhagic) thrombocythemia: Secondary | ICD-10-CM

## 2018-04-15 DIAGNOSIS — Z9884 Bariatric surgery status: Secondary | ICD-10-CM

## 2018-04-15 DIAGNOSIS — M19041 Primary osteoarthritis, right hand: Secondary | ICD-10-CM

## 2018-04-15 MED ORDER — PREDNISONE 5 MG PO TABS: ORAL_TABLET | ORAL | 0 refills | Status: DC

## 2018-04-15 NOTE — Telephone Encounter (Signed)
Prescription has been sent to the pharmacy, patient has been advised.

## 2018-04-15 NOTE — Patient Instructions (Signed)
Secukinumab injection What is this medicine? SECUKINUMAB (sek ue KIN ue mab) is used to treat psoriasis. It is also used to treat psoriatic arthritis and ankylosing spondylitis. This medicine may be used for other purposes; ask your health care provider or pharmacist if you have questions. COMMON BRAND NAME(S): Cosentyx What should I tell my health care provider before I take this medicine? They need to know if you have any of these conditions: -Crohn's disease, ulcerative colitis, or other inflammatory bowel disease -infection or history of infection -other conditions affecting the immune system -recently received or are scheduled to receive a vaccine -tuberculosis, a positive skin test for tuberculosis, or have recently been in close contact with someone who has tuberculosis -an unusual or allergic reaction to secukinumab, other medicines, latex, rubber, foods, dyes, or preservatives -pregnant or trying to get pregnant -breast-feeding How should I use this medicine? This medicine is for injection under the skin. It may be administered by a healthcare professional in a hospital or clinic setting or at home. If you get this medicine at home, you will be taught how to prepare and give this medicine. Use exactly as directed. Take your medicine at regular intervals. Do not take your medicine more often than directed. It is important that you put your used needles and syringes in a special sharps container. Do not put them in a trash can. If you do not have a sharps container, call your pharmacist or healthcare provider to get one. A special MedGuide will be given to you by the pharmacist with each prescription and refill. Be sure to read this information carefully each time. Talk to your pediatrician regarding the use of this medicine in children. Special care may be needed. Overdosage: If you think you have taken too much of this medicine contact a poison control center or emergency room at  once. NOTE: This medicine is only for you. Do not share this medicine with others. What if I miss a dose? It is important not to miss your dose. Call your doctor of health care professional if you are unable to keep an appointment. If you give yourself the medicine and you miss a dose, take it as soon as you can. If it is almost time for your next dose, take only that dose. Do not take double or extra doses. What may interact with this medicine? Do not take this medicine with any of the following medications: -live virus vaccines This medicine may also interact with the following medications: -cyclosporine -inactivated vaccines -warfarin This list may not describe all possible interactions. Give your health care provider a list of all the medicines, herbs, non-prescription drugs, or dietary supplements you use. Also tell them if you smoke, drink alcohol, or use illegal drugs. Some items may interact with your medicine. What should I watch for while using this medicine? Tell your doctor or healthcare professional if your symptoms do not start to get better or if they get worse. You will be tested for tuberculosis (TB) before you start this medicine. If your doctor prescribes any medicine for TB, you should start taking the TB medicine before starting this medicine. Make sure to finish the full course of TB medicine. Call your doctor or healthcare professional for advice if you get a fever, chills or sore throat, or other symptoms of a cold or flu. Do not treat yourself. This drug decreases your body's ability to fight infections. Try to avoid being around people who are sick. This medicine can decrease   the response to a vaccine. If you need to get vaccinated, tell your healthcare professional if you have received this medicine within the last 6 months. Extra booster doses may be needed. Talk to your doctor to see if a different vaccination schedule is needed. What side effects may I notice from  receiving this medicine? Side effects that you should report to your doctor or health care professional as soon as possible: -allergic reactions like skin rash, itching or hives, swelling of the face, lips, or tongue -signs and symptoms of infection like fever or chills; cough; sore throat; pain or trouble passing urine Side effects that usually do not require medical attention (report to your doctor or health care professional if they continue or are bothersome): -diarrhea This list may not describe all possible side effects. Call your doctor for medical advice about side effects. You may report side effects to FDA at 1-800-FDA-1088. Where should I keep my medicine? Keep out of the reach of children. Store the prefilled syringe or injection pen in a refrigerator between 2 to 8 degrees C (36 to 46 degrees F). Keep the syringe or the pen in the original carton until ready for use. Protect from light. Do not freeze. Do not shake. Prior to use, remove the syringe or pen from the refrigerator and use within 1 hour. Throw away any unused medicine after the expiration date on the label. NOTE: This sheet is a summary. It may not cover all possible information. If you have questions about this medicine, talk to your doctor, pharmacist, or health care provider.  2019 Elsevier/Gold Standard (2015-05-16 11:48:31)  Vaccines You are taking a medication(s) that can suppress your immune system.  The following immunizations are recommended: . Flu annually . Pneumonia (Pneumovax 23 and Prevnar 13 spaced at least 1 year apart) . Shingrix  Please check with your PCP to make sure you are up to date.

## 2018-04-15 NOTE — Telephone Encounter (Signed)
Please start benefits investigation for Cosentyx for psoriatic arthritis/plaque psoriasis.  She has had inadequate response to methotrexate and can not be increased due to liver function.  She will start with loading dose of 300 mg weekly for 5 weeks then 300 mg every 28 days.  Thanks!

## 2018-04-15 NOTE — Telephone Encounter (Signed)
Ok to send in prednisone taper starting at 20 mg and taper by 5 mg every 4 days.

## 2018-04-15 NOTE — Telephone Encounter (Signed)
Received a fax from [place] regarding a prior authorization for Cosentyx. Authorization has been APPROVED from 04/25/17 to 04/26/18.   Will send document to scan center.  Authorization # [id number] Phone # [phone]  Will call on Monday to get patient's 2020 benefit info to apply  3:48 PM Beatriz Chancellor, CPhT

## 2018-04-15 NOTE — Telephone Encounter (Signed)
Okay to send in prednisone taper? Patient was seen in the office today.

## 2018-04-15 NOTE — Progress Notes (Signed)
Office Visit Note  Patient: Connie Hoffman             Date of Birth: 04/07/1961           MRN: [id number]             PCP: Jenel Lucks, PA-C Referring: Burna Cash* Visit Date: 04/15/2018 Occupation: @GUAROCC @  Subjective:  Pain in right hand and lower back.   History of Present Illness: Connie Hoffman is a 57 y.o. female with history of psoriatic arthritis and psoriasis.  She states she has been having a lot of pain and discomfort.  She complains of pain and discomfort in her right hand.  Also having swelling in her right hand.  She complains of lower back pain.  Her psoriasis is flaring as well.  She believes that methotrexate is not working for her.  She has been taking 6 tablets p.o. weekly.  Dose of methotrexate was lowered due to elevated LFTs.  Activities of Daily Living:  Patient reports morning stiffness for 30 minutes.   Patient Reports nocturnal pain.  Difficulty dressing/grooming: Reports Difficulty climbing stairs: Reports Difficulty getting out of chair: Reports Difficulty using hands for taps, buttons, cutlery, and/or writing: Reports  Review of Systems  Constitutional: Positive for fatigue.  HENT: Positive for mouth dryness. Negative for mouth sores, trouble swallowing and trouble swallowing.   Eyes: Positive for dryness. Negative for pain, redness and itching.  Respiratory: Negative for shortness of breath, wheezing and difficulty breathing.   Cardiovascular: Negative for chest pain, palpitations and swelling in legs/feet.  Gastrointestinal: Negative for abdominal pain, blood in stool, constipation, diarrhea, nausea and vomiting.  Endocrine: Negative for increased urination.  Genitourinary: Negative for painful urination, nocturia and pelvic pain.  Musculoskeletal: Positive for arthralgias, joint pain, joint swelling and morning stiffness.  Skin: Positive for rash. Negative for hair loss.  Allergic/Immunologic: Negative for  susceptible to infections.  Neurological: Negative for dizziness, light-headedness, headaches, memory loss and weakness.  Hematological: Negative for bruising/bleeding tendency.  Psychiatric/Behavioral: Negative for confusion. The patient is not nervous/anxious.     PMFS History:  Patient Active Problem List   Diagnosis Date Noted  . DDD (degenerative disc disease), lumbar 01/21/2018  . Primary osteoarthritis of both hands 01/21/2018  . Primary osteoarthritis of both feet 01/21/2018  . Psoriasis 12/01/2017  . Psoriatic arthritis ([place]) 12/01/2017  . High risk medication use 12/01/2017  . 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 Thrombocytosis ([place]) 12/01/2017  . 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 Psoriatic arthritis ([place])     Family History  Problem Relation Age of Onset  . Diabetes Mother   . Hypertension Mother   . Heart Problems Mother   . Congestive Heart Failure Mother   . Diabetes Father   . Hypertension Father   . Stroke Sister   . Heart Problems Sister   . Prostate cancer Brother   . Diabetes Brother   . Asthma Daughter   . Healthy Daughter   . Healthy Son   . Healthy Son    Past Surgical History:  Procedure Laterality Date  . ABDOMINAL HYSTERECTOMY    . CHOLECYSTECTOMY    . CYSTECTOMY  07/2017  . JOINT REPLACEMENT Left    left knee  . [place] RESECTION  2018  . LUMBAR EPIDURAL INJECTION  04/13/2018  . REVISION TOTAL KNEE ARTHROPLASTY Left 12/2017  . TOTAL SHOULDER ARTHROPLASTY Bilateral  Social History   Social History Narrative  . Not on file    Objective: Vital Signs: BP 117/68 (BP Location: Left Arm, Patient Position: Sitting, Cuff Size: Normal)   Pulse 72   Resp 13   Ht 5\' 5"  (1.651 m)   Wt 195 lb (88.5 kg)   BMI 32.45 kg/m    Physical Exam Vitals signs and nursing note reviewed.  Constitutional:      Appearance: She is well-developed.  HENT:     Head:  Normocephalic and atraumatic.  Eyes:     Conjunctiva/sclera: Conjunctivae normal.  Neck:     Musculoskeletal: Normal range of motion.  Cardiovascular:     Rate and Rhythm: Normal rate and regular rhythm.     Heart sounds: Normal heart sounds.  Pulmonary:     Effort: Pulmonary effort is normal.     Breath sounds: Normal breath sounds.  Abdominal:     General: Bowel sounds are normal.     Palpations: Abdomen is soft.  Lymphadenopathy:     Cervical: No cervical adenopathy.  Skin:    General: Skin is warm and dry.     Capillary Refill: Capillary refill takes less than 2 seconds.     Findings: Rash present.     Comments: Psoriasis patches behind ears, on trunk and inguinal region.  Neurological:     Mental Status: She is alert and oriented to person, place, and time.  Psychiatric:        Behavior: Behavior normal.      Musculoskeletal Exam: C-spine thoracic lumbar spine good range of motion.  She has tenderness over SI joints.  Shoulder joints elbow joints wrist joints in good range of motion.  She has synovitis in multiple joints as described below.  She had painful range of motion of bilateral hip joints and right knee joint.  Right knee joint had warmth and swelling.  No dactylitis, plantar fasciitis was noted.  CDAI Exam: CDAI Score: 9.6  Patient Global Assessment: 8 (mm); Provider Global Assessment: 8 (mm) Swollen: 4 ; Tender: 6  Joint Exam      Right  Left  MCP 2  Swollen Tender  Swollen Tender  MCP 3  Swollen Tender     Sacroiliac   Tender   Tender  Knee  Swollen Tender        Investigation: No additional findings.  Imaging: No results found.  Recent Labs: Lab Results  Component Value Date   WBC 4.6 03/09/2018   HGB 11.8 03/09/2018   PLT 331 03/09/2018   NA 147 (H) 03/09/2018   K 4.8 03/09/2018   CL 109 03/09/2018   CO2 31 03/09/2018   GLUCOSE 80 03/09/2018   BUN 14 03/09/2018   CREATININE 0.64 03/09/2018   BILITOT 0.2 03/09/2018   AST 15 03/09/2018     ALT 19 03/09/2018   PROT 6.7 03/09/2018   CALCIUM 9.4 03/09/2018   GFRAA 116 03/09/2018    Speciality Comments: No specialty comments available.  Procedures:  No procedures performed Allergies: Aspirin   Assessment / Plan:     Visit Diagnoses: Psoriatic arthritis (HCC)-patient is having a flare with synovitis in multiple joints as described above.  She is aggressive psoriatic arthritis which is not controlled with methotrexate.  We could not increase methotrexate dose further due to elevation of LFTs.  Different treatment options and their side effects were discussed.  After reviewing indications side effects contraindications we applied for Cosentyx.  Handout was given and consent was taken.  Psoriasis-she also has plaque psoriasis which is flaring.  She has several lesions on her trunk, inguinal region and behind her years.  High risk medication use - MTX 6 tablets p.o. weekly, folic acid 2 mg p.o. daily.   S/P total knee replacement, left - Left knee joint revision December 06, 2017, September 2019 manipulation.  She is not having much discomfort in her left knee currently.  Although her right knee joint is warm and swollen.  DDD (degenerative disc disease), lumbar - with spinal stenosis-chronic pain  Primary osteoarthritis of both hands-some stiffness due to osteoarthritis most the symptoms are coming from psoriatic arthritis.  Primary osteoarthritis of both feet-she is currently not having much discomfort.  History of gastric bypass  Thrombocytosis ([place])   Orders: No orders of the defined types were placed in this encounter.  No orders of the defined types were placed in this encounter.   Face-to-face time spent with patient was 30 minutes. Greater than 50% of time was spent in counseling and coordination of care.  Follow-Up Instructions: Return in about 2 months (around 06/16/2018) for Psoriatic arthritis.   Bo Merino, MD  Note - This record has been created  using Editor, commissioning.  Chart creation errors have been sought, but may not always  have been located. Such creation errors do not reflect on  the standard of medical care.

## 2018-04-15 NOTE — Telephone Encounter (Signed)
Received a Prior Authorization request from [employer] for Cosentyx 300mg . Authorization has been submitted to patient's insurance via Cover My Meds. Will update once we receive a response.

## 2018-04-15 NOTE — Telephone Encounter (Signed)
Patient called stating she was able to get her flu and pneumonia vaccines today, but will have to wait until 05/02/18 for the Shingrix vaccine.  Patient is also checking if Dr. Estanislado Pandy is sending in a prescription of Prednisone to [facility] [address] in [place].

## 2018-04-15 NOTE — Progress Notes (Signed)
Pharmacy Note  Subjective:  Patient presents today to the [facility] to see Dr. Estanislado Pandy.  Patient was seen by the pharmacist for counseling on Cosentyx for psoriatic arthritis/plaque psoriasis.She is currently on methotrexate 15 mg weekly with inadequate response.  Her methotrexate dose can not be increased due to elevated LFT's.  Objective:  CBC    Component Value Date/Time   WBC 4.6 03/09/2018 0958   RBC 4.00 03/09/2018 0958   HGB 11.8 03/09/2018 0958   HCT 35.4 03/09/2018 0958   PLT 331 03/09/2018 0958   MCV 88.5 03/09/2018 0958   MCH 29.5 03/09/2018 0958   MCHC 33.3 03/09/2018 0958   RDW 13.2 03/09/2018 0958   LYMPHSABS 1,458 03/09/2018 0958   EOSABS 87 03/09/2018 0958   BASOSABS 28 03/09/2018 0958    CMP     Component Value Date/Time   NA 147 (H) 03/09/2018 0958   K 4.8 03/09/2018 0958   CL 109 03/09/2018 0958   CO2 31 03/09/2018 0958   GLUCOSE 80 03/09/2018 0958   BUN 14 03/09/2018 0958   CREATININE 0.64 03/09/2018 0958   CALCIUM 9.4 03/09/2018 0958   PROT 6.7 03/09/2018 0958   AST 15 03/09/2018 0958   ALT 19 03/09/2018 0958   BILITOT 0.2 03/09/2018 0958   GFRNONAA 100 03/09/2018 0958   GFRAA 116 03/09/2018 0958    Hepatitis Latest Ref Rng & Units 12/01/2017  Hep B Surface Ag NON-REACTI NON-REACTIVE  Hep B IgM NON-REACTI NON-REACTIVE  Hep C Ab NON-REACTI NON-REACTIVE  Hep C Ab NON-REACTI NON-REACTIVE    Lab Results  Component Value Date   HIV NON-REACTIVE 12/01/2017    Immunoglobulin Electrophoresis Latest Ref Rng & Units 12/01/2017  IgA  47 - 310 mg/dL 291  IgG 600 - 1,640 mg/dL 952  IgM 50 - 300 mg/dL 57    Serum Protein Electrophoresis Latest Ref Rng & Units 03/09/2018  Total Protein 6.1 - 8.1 g/dL 6.7  Albumin 3.8 - 4.8 g/dL -  Alpha-1 0.2 - 0.3 g/dL -  Alpha-2 0.5 - 0.9 g/dL -  Beta Globulin 0.4 - 0.6 g/dL -  Beta 2 0.2 - 0.5 g/dL -  Gamma Globulin 0.8 - 1.7 g/dL -    No results found for: G6PDH  No results found for:  TPMT  Does patient have a history of inflammatory bowel disease? No  Assessment/Plan:  Counseled patient that Cosentyx is a IL-17 inhibitor that works to reduce pain and inflammation associated with arthritis.  Counseled patient on purpose, proper use, and adverse effects of Cosentyx. Reviewed the most common adverse effects of infection, inflammatory bowel disease, and allergic reaction.  Reviewed the importance of regular labs while on Cosentyx.  Counseled patient that Cosentyx should be held prior to scheduled surgery.  Counseled patient to avoid live vaccines while on Cosentyx.  Advised patient to get annual influenza vaccine and the pneumococcal vaccine as indicated.  Patient states she will follow up with PCP.  Provided patient with medication education material and answered all questions.  Patient consented to Cosentyx.  Will upload consent into patient's chart.  Reviewed storage information for Cosentyx.  Advised initial injection must be administered in office.  Patient voiced understanding.      Will apply for Cosentyx through patient's insurance and update when we receive a response. Her dose will be 300 mg every 7 days for 5 weeks then 300 mg every 28 days.  Prescription will be sent pending insurance approval.  All questions encouraged and answered.  Instructed patient to call with any further questions or concerns.  Mariella Saa, PharmD, [facility] Rheumatology Clinical Pharmacist  04/15/2018 10:23 AM

## 2018-04-21 NOTE — Telephone Encounter (Signed)
Patient is switching to [place] for 2020 pharmacy benefits. Submitted Prior Authorization, awaiting response.  9:22 AM Beatriz Chancellor, CPhT

## 2018-04-21 NOTE — Telephone Encounter (Signed)
Received a fax from [place] regarding a prior authorization for Cosentyx. Authorization has been APPROVED from 04/21/18 to 04/22/2019. Patient's plan starts on 04/27/2018.  Will send document to scan center.  Authorization # [id number]  Phone # [phone]  Will run test claim on 1/2 to see patient's copay. Medicare patient- could apply for [employer] patient assistance if needed.  10:08 AM Beatriz Chancellor, CPhT  .

## 2018-04-22 ENCOUNTER — Ambulatory Visit: Payer: Medicare HMO | Admitting: Physician Assistant

## 2018-04-29 NOTE — Telephone Encounter (Signed)
Spoke to patient, she spoke to [place] yesterday to complete her enrollment. I will run a test claim on Monday to check her eligibility.

## 2018-04-29 NOTE — Telephone Encounter (Signed)
Ran test claim for patient, coverage is not active. [facility], they do not show patient as active. Said maybe patient did not complete enrollment. Rep advised that patient should call [employer] to see what is going on. Left message for patient advising to call [employer] (number on the back of her card) to see about her eligibility.

## 2018-05-02 NOTE — Telephone Encounter (Signed)
Ran test claim, Loading doses were $3.90, and Maintenance dose is $3.90 for 1 month supply. Patient must fill through [place].   Patient has Medicare, so she would be eligible to apply for [employer] PAP if her copays ever become unaffordable.  9:00 AM Connie Hoffman, CPhT

## 2018-05-03 NOTE — Progress Notes (Signed)
Pharmacy Note  Subjective:  Patient presents today to the [facility] to see Dr. Estanislado Pandy.  Patient was seen by the pharmacist for counseling on Cosentyx for psoriatic arthritis and plaque psoriasis. She is currently on methotrexate 15 mg weekly with inadequate response.  Her methotrexate dose can not be increased due to elevated LFT's.She reports having her flu shot and Pneumonia vaccine on 12/20.  Objective:  CBC    Component Value Date/Time   WBC 4.6 03/09/2018 0958   RBC 4.00 03/09/2018 0958   HGB 11.8 03/09/2018 0958   HCT 35.4 03/09/2018 0958   PLT 331 03/09/2018 0958   MCV 88.5 03/09/2018 0958   MCH 29.5 03/09/2018 0958   MCHC 33.3 03/09/2018 0958   RDW 13.2 03/09/2018 0958   LYMPHSABS 1,458 03/09/2018 0958   EOSABS 87 03/09/2018 0958   BASOSABS 28 03/09/2018 0958    CMP     Component Value Date/Time   NA 147 (H) 03/09/2018 0958   K 4.8 03/09/2018 0958   CL 109 03/09/2018 0958   CO2 31 03/09/2018 0958   GLUCOSE 80 03/09/2018 0958   BUN 14 03/09/2018 0958   CREATININE 0.64 03/09/2018 0958   CALCIUM 9.4 03/09/2018 0958   PROT 6.7 03/09/2018 0958   AST 15 03/09/2018 0958   ALT 19 03/09/2018 0958   BILITOT 0.2 03/09/2018 0958   GFRNONAA 100 03/09/2018 0958   GFRAA 116 03/09/2018 0958    Baseline Immunosuppressant Therapy Labs  TB gold: negative 06/24/17  Hepatitis Latest Ref Rng & Units 12/01/2017  Hep B Surface Ag NON-REACTI NON-REACTIVE  Hep B IgM NON-REACTI NON-REACTIVE  Hep C Ab NON-REACTI NON-REACTIVE  Hep C Ab NON-REACTI NON-REACTIVE    Lab Results  Component Value Date   HIV NON-REACTIVE 12/01/2017    Immunoglobulin Electrophoresis Latest Ref Rng & Units 12/01/2017  IgA  47 - 310 mg/dL 291  IgG 600 - 1,640 mg/dL 952  IgM 50 - 300 mg/dL 57    Serum Protein Electrophoresis Latest Ref Rng & Units 03/09/2018  Total Protein 6.1 - 8.1 g/dL 6.7  Albumin 3.8 - 4.8 g/dL -  Alpha-1 0.2 - 0.3 g/dL -  Alpha-2 0.5 - 0.9 g/dL -  Beta  Globulin 0.4 - 0.6 g/dL -  Beta 2 0.2 - 0.5 g/dL -  Gamma Globulin 0.8 - 1.7 g/dL -    No results found for: G6PDH  No results found for: TPMT   Does patient have a history of inflammatory bowel disease? No  Assessment/Plan:  Counseled patient that Cosentyx is a IL-17 inhibitor that works to reduce pain and inflammation associated with arthritis.  Counseled patient on purpose, proper use, and adverse effects of Cosentyx.  Reviewed the most common adverse effects of infection, inflammatory bowel disease, and allergic reaction.  Counseled patient that Cosentyx should be held prior to scheduled surgery.  Counseled patient to avoid live vaccines while on Cosentyx.  She will check with pharmacy to see which pneumonia vaccine she had and plans to get Shingrix vaccine when available.  Reviewed the importance of regular labs while on Cosentyx.Patient is to return in 1 month and then every 3 months for monitoring. Standing orders placed. Provided patient with medication education material and answered all questions.  Patient consented to Cosentyx.  Will upload consent into patient's chart.  Patient approved through insurance and was told per the company that her co-pay would be $0.  Informed patient that if there are any changes we can apply for [employer] patient  assistance program. Reviewed storage information for Cosentyx.  Advised initial injection must be administered in office.  Patient voiced understanding.    Patient dose will be for plaque psoriasis +/- psoriatic arthritis 300 mg every 7 days for 5 weeks then 300 mg every 28 days.   Demonstrated proper injection technique with demo pen.  Patient able to demonstrate proper injection technique using the teach back method.  Patient self injected in the lower abdomen with:  Sample Medication: Cosentyx 150 mg/ml (2 sensoready pens) NDC: 2080-2233-61 Lot: [id number] Expiration: 04/28/2019  Patient tolerated well.  Observed for 30 mins in office for adverse  reaction and none noted. Instructed patient to call with any questions/issues.    All questions encouraged and answered.  Instructed patient to call with any further questions or concerns.  Mariella Saa, PharmD, [facility] Rheumatology Clinical Pharmacist  05/06/2018 9:39 AM

## 2018-05-03 NOTE — Telephone Encounter (Signed)
Patient's New Start Visit has been scheduled for 05/06/18 @ 8:15am. She will start with a sample. Patient must use [facility].

## 2018-05-04 IMAGING — CR LEFT WRIST - COMPLETE 3+ VIEW
4 series · 4 of 4 positions shown · non-contrast
Comparison: None

CLINICAL DATA: Fell today at church, RIGHT shoulder pain, RIGHT
elbow and wrist pain, LEFT anterior wrist pain with bruising, RIGHT
hip pain, initial encounter

EXAM:
LEFT WRIST - COMPLETE 3+ VIEW

[wrist pa]
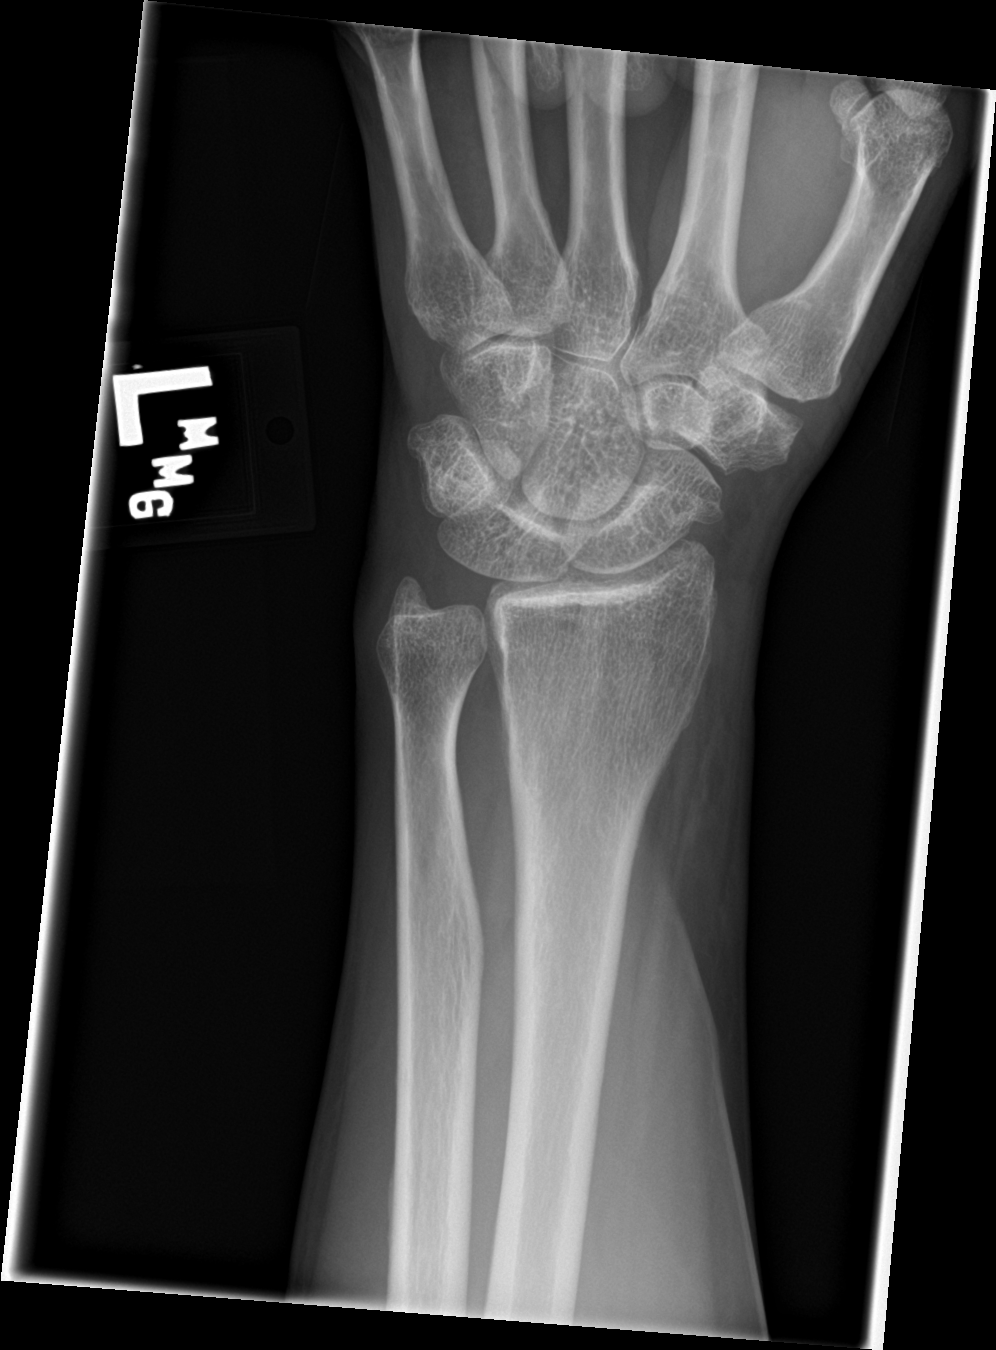

[wrist obl]
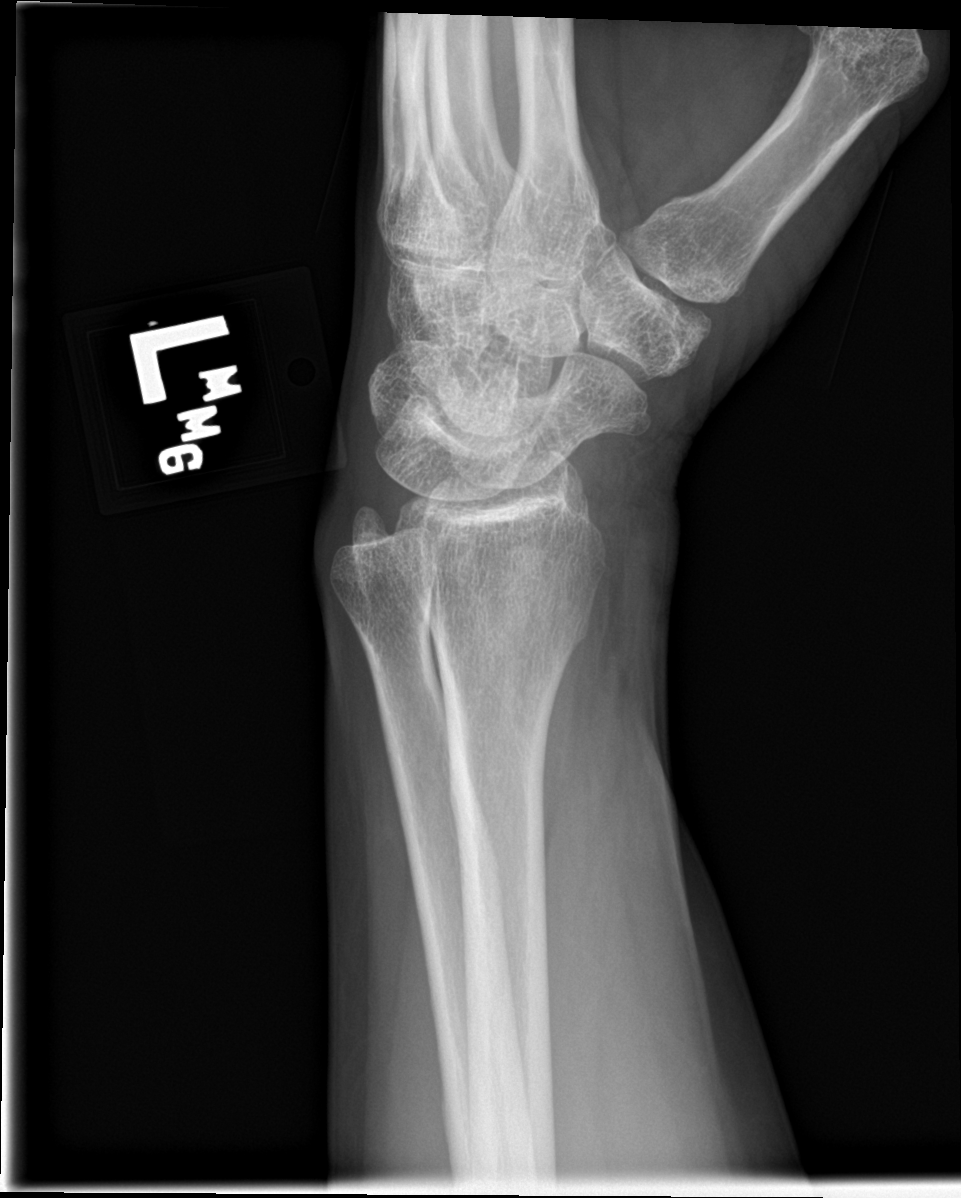

[wrist lat]
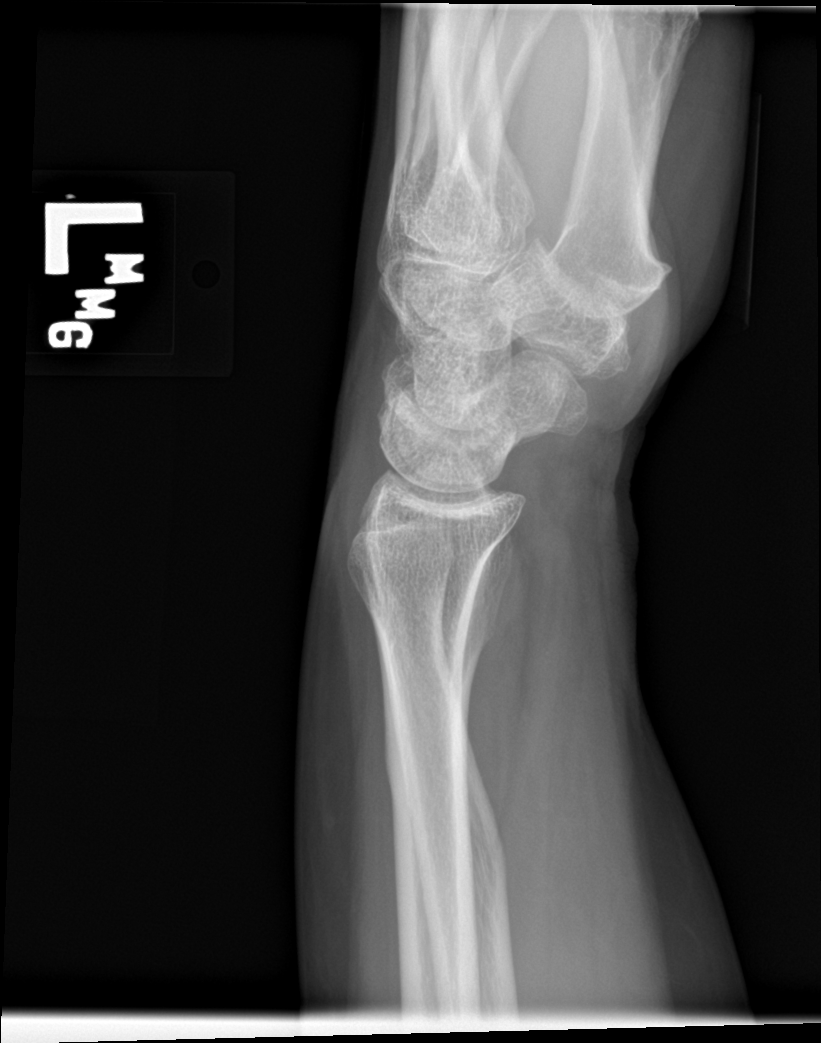

[wrist navicular]
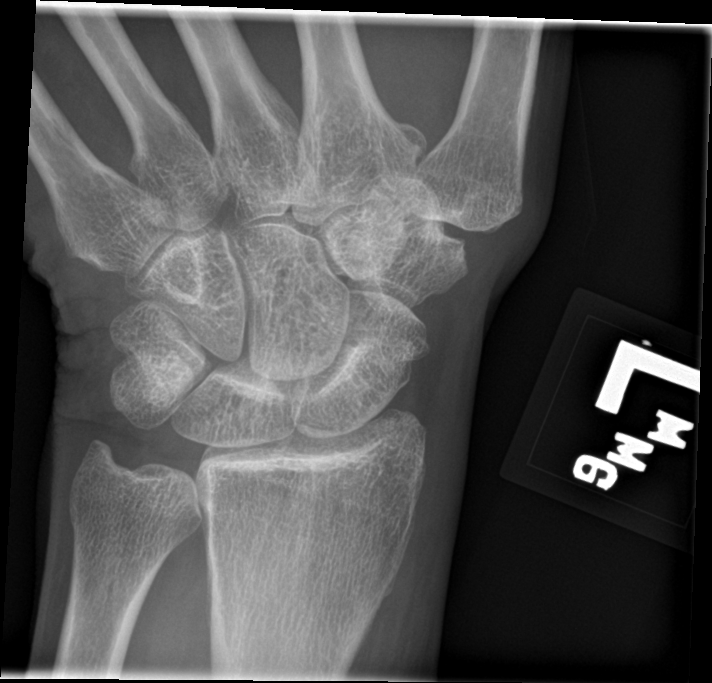

[4 of 4 positions shown; findings below may reference images not displayed]

FINDINGS: Osseous demineralization.

Joint spaces preserved.

No acute fracture, dislocation, or bone destruction.
IMPRESSION: No acute osseous abnormalities.

## 2018-05-05 ENCOUNTER — Other Ambulatory Visit: Payer: Self-pay | Admitting: Rheumatology

## 2018-05-05 NOTE — Telephone Encounter (Signed)
Last Visit: 04/15/18 Next Visit: 06/17/18 Labs: 03/09/18 Sodium borderline elevated-147. All other labs are WNL  Okay to refill per Dr. Estanislado Pandy

## 2018-05-06 ENCOUNTER — Ambulatory Visit (INDEPENDENT_AMBULATORY_CARE_PROVIDER_SITE_OTHER): Payer: MEDICARE | Admitting: Pharmacist

## 2018-05-06 VITALS — BP 108/67 | HR 77

## 2018-05-06 DIAGNOSIS — L4050 Arthropathic psoriasis, unspecified: Secondary | ICD-10-CM

## 2018-05-06 DIAGNOSIS — L409 Psoriasis, unspecified: Secondary | ICD-10-CM

## 2018-05-06 MED ORDER — SECUKINUMAB (300 MG DOSE) 150 MG/ML ~~LOC~~ SOAJ: 300.0000 mg | SUBCUTANEOUS | 0 refills | Status: DC

## 2018-05-06 MED ORDER — SECUKINUMAB (300 MG DOSE) 150 MG/ML ~~LOC~~ SOSY
300.0000 mg | PREFILLED_SYRINGE | Freq: Once | SUBCUTANEOUS | Status: AC
  Administered 2018-05-06: 300 mg via SUBCUTANEOUS

## 2018-05-06 NOTE — Patient Instructions (Addendum)
Cosentyx Dosing 300 mg (2 pens) every 7 days for 5 weeks then every 28 days (1/10, 1/17,1/24,1/31,2/7 for load then 3/6)  Standing Labs We placed an order today for your standing lab work.    Please come back and get your standing labs in 1 month and then every 3 months.  We have open lab Monday through Friday from 8:30-11:30 AM and 1:30-4:00 PM  at the office of Dr. Bo Merino.   You may experience shorter wait times on Monday and Friday afternoons. The office is located at [address], [place], [place], [place] [address] No appointment is necessary.   Labs are drawn by [facility].  You may receive a bill from [facility] for your lab work.  If you wish to have your labs drawn at another location, please call the office 24 hours in advance to send orders.  If you have any questions regarding directions or hours of operation,  please call [phone].   Just as a reminder please drink plenty of water prior to coming for your lab work. Thanks!  Vaccines You are taking a medication(s) that can suppress your immune system.  The following immunizations are recommended: . Flu annually . Pneumonia (Pneumovax 23 and Prevnar 13 spaced at least 1 year apart) . Shingrix  Please check with your PCP to make sure you are up to date.  Helpful Tips for Injecting To help alleviate pain and injection site reactions consider the following tips:  . Placing something cold (like and ice gel pack or cold water bottle) on the injection site just before cleansing with alcohol may help reduce pain . If you have a localized reaction (redness, mild swelling, warmth, and itching) you can use topical corticosteroids (hydrocortisone cream) or antihistamine (Claritin) to help minimize reaction the day of, the day before, and the day after injecting . Always inject this medication at room temperature (remove from refrigerator 15-20 minutes before injecting; this may help eliminate stinging). . Always rotate  your injection sites using inside/outside thigh of both legs, abdomen divided into 4 quadrants (stay away from waist line, and 2" away from navel). . Do not inject into areas where skin is tender, bruised, red, or hard or where there are scars or stretch marks. . Always let alcohol dry on the skin before injecting. . Place a cold, damp towel or small ice pack on the injection site for 10 or 15 minutes every 1 to 2 hours if it hurts or is swollen.

## 2018-05-12 ENCOUNTER — Telehealth: Payer: Self-pay | Admitting: *Deleted

## 2018-05-12 NOTE — Telephone Encounter (Signed)
Patient came by the office today for a sample of Cosentyx as she has not received the prescription from [facility]. Patient was provided with the phone number to contact [facility] to set up delivery.   Medication Samples have been provided to the patient.  Drug name: Cosentyx       Strength: 300 mg        Qty: 1  LOT: [id number]  Exp.Date: 05/2019  Dosing instructions: Inject two pens weekly for loading dose. Weeks 0,1,2,3,4   The patient has been instructed regarding the correct time, dose, and frequency of taking this medication, including desired effects and most common side effects.   Gwenlyn Perking 12:49 PM 05/12/2018

## 2018-06-01 ENCOUNTER — Other Ambulatory Visit: Payer: Self-pay

## 2018-06-01 DIAGNOSIS — Z79899 Other long term (current) drug therapy: Secondary | ICD-10-CM

## 2018-06-02 LAB — COMPLETE METABOLIC PANEL WITH GFR
AG Ratio: 1.5 (calc) (ref 1.0–2.5)
ALBUMIN MSPROF: 3.7 g/dL (ref 3.6–5.1)
ALT: 22 U/L (ref 6–29)
AST: 17 U/L (ref 10–35)
Alkaline phosphatase (APISO): 81 U/L (ref 37–153)
BUN: 17 mg/dL (ref 7–25)
CALCIUM: 8.7 mg/dL (ref 8.6–10.4)
CO2: 26 mmol/L (ref 20–32)
Chloride: 110 mmol/L (ref 98–110)
Creat: 0.66 mg/dL (ref 0.50–1.05)
GFR, EST NON AFRICAN AMERICAN: 99 mL/min/{1.73_m2} (ref >=60)
GFR, Est African American: 115 mL/min/{1.73_m2} (ref >=60)
GLOBULIN: 2.5 g/dL (ref 1.9–3.7)
Glucose, Bld: 97 mg/dL (ref 65–99)
Potassium: 4.3 mmol/L (ref 3.5–5.3)
Sodium: 145 mmol/L (ref 135–146)
TOTAL PROTEIN: 6.2 g/dL (ref 6.1–8.1)
Total Bilirubin: 0.2 mg/dL (ref 0.2–1.2)

## 2018-06-02 LAB — CBC WITH DIFFERENTIAL/PLATELET
Absolute Monocytes: 582 cells/uL (ref 200–950)
BASOS PCT: 0.6 %
Basophils Absolute: 31 cells/uL (ref 0–200)
EOS ABS: 260 {cells}/uL (ref 15–500)
Eosinophils Relative: 5 %
HEMATOCRIT: 35.2 % (ref 35.0–45.0)
Hemoglobin: 11.9 g/dL (ref 11.7–15.5)
Lymphs Abs: 1477 cells/uL (ref 850–3900)
MCH: 30.7 pg (ref 27.0–33.0)
MCHC: 33.8 g/dL (ref 32.0–36.0)
MCV: 90.7 fL (ref 80.0–100.0)
MPV: 10.6 fL (ref 7.5–12.5)
Monocytes Relative: 11.2 %
NEUTROS PCT: 54.8 %
Neutro Abs: 2850 cells/uL (ref 1500–7800)
PLATELETS: 337 10*3/uL (ref 140–400)
RBC: 3.88 10*6/uL (ref 3.80–5.10)
RDW: 13 % (ref 11.0–15.0)
TOTAL LYMPHOCYTE: 28.4 %
WBC: 5.2 10*3/uL (ref 3.8–10.8)

## 2018-06-02 NOTE — Progress Notes (Signed)
WNL

## 2018-06-03 NOTE — Progress Notes (Deleted)
Office Visit Note  Patient: Connie Hoffman             Date of Birth: Mar 12, 1961           MRN: [id number]             PCP: Jenel Lucks, PA-C Referring: Burna Cash* Visit Date: 06/17/2018 Occupation: @GUAROCC @  Subjective:  No chief complaint on file.   History of Present Illness: Connie Hoffman is a 58 y.o. female ***   Activities of Daily Living:  Patient reports morning stiffness for *** {minute/hour:19697}.   Patient {ACTIONS;DENIES/REPORTS:21021675::"Denies"} nocturnal pain.  Difficulty dressing/grooming: {ACTIONS;DENIES/REPORTS:21021675::"Denies"} Difficulty climbing stairs: {ACTIONS;DENIES/REPORTS:21021675::"Denies"} Difficulty getting out of chair: {ACTIONS;DENIES/REPORTS:21021675::"Denies"} Difficulty using hands for taps, buttons, cutlery, and/or writing: {ACTIONS;DENIES/REPORTS:21021675::"Denies"}  No Rheumatology ROS completed.   PMFS History:  Patient Active Problem List   Diagnosis Date Noted  . DDD (degenerative disc disease), lumbar 01/21/2018  . Primary osteoarthritis of both hands 01/21/2018  . Primary osteoarthritis of both feet 01/21/2018  . Psoriasis 12/01/2017  . Psoriatic arthritis ([place]) 12/01/2017  . High risk medication use 12/01/2017  . 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 Thrombocytosis ([place]) 12/01/2017  . 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 Psoriatic arthritis ([place])     Family History  Problem Relation Age of Onset  . Diabetes Mother   . Hypertension Mother   . Heart Problems Mother   . Congestive Heart Failure Mother   . Diabetes Father   . Hypertension Father   . Stroke Sister   . Heart Problems Sister   . Prostate cancer Brother   . Diabetes Brother   . Asthma Daughter   . Healthy Daughter   . Healthy Son   . Healthy Son    Past Surgical History:  Procedure Laterality Date  . ABDOMINAL HYSTERECTOMY    . CHOLECYSTECTOMY    .  CYSTECTOMY  07/2017  . JOINT REPLACEMENT Left    left knee  . [place] RESECTION  2018  . LUMBAR EPIDURAL INJECTION  04/13/2018  . REVISION TOTAL KNEE ARTHROPLASTY Left 12/2017  . TOTAL SHOULDER ARTHROPLASTY Bilateral    Social History   Social History Narrative  . Not on file    There is no immunization history on file for this patient.   Objective: Vital Signs: There were no vitals taken for this visit.   Physical Exam   Musculoskeletal Exam: ***  CDAI Exam: CDAI Score: Not documented Patient Global Assessment: Not documented; Provider Global Assessment: Not documented Swollen: Not documented; Tender: Not documented Joint Exam   Not documented   There is currently no information documented on the homunculus. Go to the Rheumatology activity and complete the homunculus joint exam.  Investigation: No additional findings.  Imaging: No results found.  Recent Labs: Lab Results  Component Value Date   WBC 5.2 06/01/2018   HGB 11.9 06/01/2018   PLT 337 06/01/2018   NA 145 06/01/2018   K 4.3 06/01/2018   CL 110 06/01/2018   CO2 26 06/01/2018   GLUCOSE 97 06/01/2018   BUN 17 06/01/2018   CREATININE 0.66 06/01/2018   BILITOT 0.2 06/01/2018   AST 17 06/01/2018   ALT 22 06/01/2018   PROT 6.2 06/01/2018   CALCIUM 8.7 06/01/2018   GFRAA 115 06/01/2018    Speciality Comments: No specialty comments available.  Procedures:  No procedures performed Allergies: Aspirin   Assessment / Plan:  Visit Diagnoses: No diagnosis found.   Orders: No orders of the defined types were placed in this encounter.  No orders of the defined types were placed in this encounter.   Face-to-face time spent with patient was *** minutes. Greater than 50% of time was spent in counseling and coordination of care.  Follow-Up Instructions: No follow-ups on file.   Earnestine Mealing, CMA  Note - This record has been created using Editor, commissioning.  Chart  creation errors have been sought, but may not always  have been located. Such creation errors do not reflect on  the standard of medical care.

## 2018-06-16 ENCOUNTER — Ambulatory Visit: Payer: MEDICARE | Admitting: Rheumatology

## 2018-06-16 NOTE — Progress Notes (Signed)
Office Visit Note  Patient: Connie Hoffman             Date of Birth: [birth year]           MRN: [id number]             PCP: Jenel Lucks, PA-C Referring: Burna Cash* Visit Date: 06/27/2018 Occupation: @GUAROCC @  Subjective:  Pain in bilateral hands    History of Present Illness: Batoul Hoffman is a 58 y.o. female with history of psoriatic arthritis, osteoarthritis, and DDD. She is on Cosentyx 300 mg sq every 28 days, MTX 6 tablets po once weekly, and folic acid 1 mg po daily.  She denies missing any doses recently.  She denies any recent infections.  She reports that on Friday she developed increased pain in the right hand.  She states that she has noticed some swelling.  She reports that on Friday she was lifting some heavy objects but is unsure if this had increased pain.  She reports she is been wearing copper gloves which has been providing some pain relief.  She is also been taking Tylenol for pain relief.  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  She denies any Achilles tendinitis or plantar fasciitis.  She denies any SI joint pain.  She has chronic lower back pain.  She reports overall her left knee replacement is doing well.  She reports that she had psoriasis on her abdomen and followed up with her dermatologist to prescribed to topical creams which have been improving the psoriasis.  She reports has been having muscle cramps in bilateral lower extremities at night.  Activities of Daily Living:  Patient reports morning stiffness for 15-20 minutes.   Patient Denies nocturnal pain.  Difficulty dressing/grooming: Denies Difficulty climbing stairs: Denies Difficulty getting out of chair: Denies Difficulty using hands for taps, buttons, cutlery, and/or writing: Reports  Review of Systems  Constitutional: Positive for fatigue.  HENT: Positive for mouth dryness. Negative for mouth sores and nose dryness.   Eyes: Positive for  dryness. Negative for pain and visual disturbance.  Respiratory: Negative for cough, hemoptysis, shortness of breath and difficulty breathing.   Cardiovascular: Negative for chest pain, palpitations, hypertension and swelling in legs/feet.  Gastrointestinal: Negative for blood in stool, constipation and diarrhea.  Endocrine: Negative for increased urination.  Genitourinary: Negative for painful urination.  Musculoskeletal: Positive for arthralgias, joint pain, joint swelling and morning stiffness. Negative for myalgias, muscle weakness, muscle tenderness and myalgias.  Skin: Positive for rash. Negative for color change, pallor, hair loss, nodules/bumps, skin tightness, ulcers and sensitivity to sunlight.  Allergic/Immunologic: Negative for susceptible to infections.  Neurological: Negative for dizziness, numbness, headaches and weakness.  Hematological: Negative for swollen glands.  Psychiatric/Behavioral: Positive for sleep disturbance. Negative for depressed mood. The patient is not nervous/anxious.     PMFS History:  Patient Active Problem List   Diagnosis Date Noted  . DDD (degenerative disc disease), lumbar 01/21/2018  . Primary osteoarthritis of both hands 01/21/2018  . Primary osteoarthritis of both feet 01/21/2018  . Psoriasis 12/01/2017  . Psoriatic arthritis ([place]) 12/01/2017  . High risk medication use 12/01/2017  . 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 Thrombocytosis ([place]) 12/01/2017  . 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 Psoriatic arthritis ([place])     Family History  Problem Relation Age of Onset  . Diabetes Mother   . Hypertension Mother   .  Heart Problems Mother   . Congestive Heart Failure Mother   . Diabetes Father   . Hypertension Father   . Stroke Sister   . Heart Problems Sister   . Prostate cancer Brother   . Diabetes Brother   . Asthma Daughter   . Healthy Daughter   . Healthy Son   . Healthy Son      Past Surgical History:  Procedure Laterality Date  . ABDOMINAL HYSTERECTOMY    . CHOLECYSTECTOMY    . CYSTECTOMY  07/2017  . JOINT REPLACEMENT Left    left knee  . [place] RESECTION  2018  . LUMBAR EPIDURAL INJECTION  04/13/2018  . REVISION TOTAL KNEE ARTHROPLASTY Left 12/2017  . TOTAL SHOULDER ARTHROPLASTY Bilateral    Social History   Social History Narrative  . Not on file    There is no immunization history on file for this patient.   Objective: Vital Signs: BP 117/74 (BP Location: Left Arm, Patient Position: Sitting, Cuff Size: Normal)   Pulse 80   Resp 14   Ht 5\' 5"  (1.651 m)   Wt 208 lb (94.3 kg)   BMI 34.61 kg/m    Physical Exam Vitals signs and nursing note reviewed.  Constitutional:      Appearance: She is well-developed.  HENT:     Head: Normocephalic and atraumatic.  Eyes:     Conjunctiva/sclera: Conjunctivae normal.  Neck:     Musculoskeletal: Normal range of motion.  Cardiovascular:     Rate and Rhythm: Normal rate and regular rhythm.     Heart sounds: Normal heart sounds.  Pulmonary:     Effort: Pulmonary effort is normal.     Breath sounds: Normal breath sounds.  Abdominal:     General: Bowel sounds are normal.     Palpations: Abdomen is soft.  Lymphadenopathy:     Cervical: No cervical adenopathy.  Skin:    General: Skin is warm and dry.     Capillary Refill: Capillary refill takes less than 2 seconds.  Neurological:     Mental Status: She is alert and oriented to person, place, and time.  Psychiatric:        Behavior: Behavior normal.      Musculoskeletal Exam: C-spine, thoracic spine, lumbar spine good range of motion.  She has midline spinal tenderness in the lumbar region.  No SI joint tenderness.  Shoulder joints painful ROM.  Elbow joints, wrist joints, MCPs, PIPs, and DIPs good ROM with no synovitis.  She has tenderness as described below.  Hip joints, knee joints, ankle joints, MTPs, PIPs, DIPs good range of  motion with no synovitis. Right knee replacement warmth and limited extension.  Left knee good ROM with no warmth or effusion. No tenderness swelling of ankle joints.  CDAI Exam: CDAI Score: Not documented Patient Global Assessment: Not documented; Provider Global Assessment: Not documented Swollen: 0 ; Tender: 6  Joint Exam      Right  Left  Wrist   Tender     MCP 3   Tender     MCP 4   Tender   Tender  MCP 5   Tender   Tender     Investigation: No additional findings.  Imaging: No results found.  Recent Labs: Lab Results  Component Value Date   WBC 5.2 06/01/2018   HGB 11.9 06/01/2018   PLT 337 06/01/2018   NA 145 06/01/2018   K 4.3 06/01/2018   CL 110 06/01/2018   CO2  26 06/01/2018   GLUCOSE 97 06/01/2018   BUN 17 06/01/2018   CREATININE 0.66 06/01/2018   BILITOT 0.2 06/01/2018   AST 17 06/01/2018   ALT 22 06/01/2018   PROT 6.2 06/01/2018   CALCIUM 8.7 06/01/2018   GFRAA 115 06/01/2018    Speciality Comments: No specialty comments available.  Procedures:  No procedures performed Allergies: Aspirin   Assessment / Plan:     Visit Diagnoses: Psoriatic arthritis ([place]): She has no synovitis or dactylitis on exam.  She has been experiencing increased pain in bilateral hands since Friday.  She has tenderness of the right wrist, right third, fourth, fifth MCPs and left fourth and fifth MCP joints but no synovitis was noted.  She has complete fist formation bilaterally.  She has no SI joint tenderness on exam.  No Achilles tendinitis or plantar fasciitis.  She will psoriasis on her abdomen and has been applying topical creams.  She has been injecting Cosentyx 300 mg subcutaneously once monthly, taking methotrexate 6 tablets by mouth once weekly, and folic acid 1 mg by mouth daily.  She has not missed any doses recently.  She has not had any recent infections.  She has not been experiencing frequent flares and feels that overall these medications are controlling her  arthritis.  She will continue on this current treatment regimen.  A refill of methotrexate was sent to the pharmacy today.  X-rays of both hands and feet were obtained on 12/01/2017 that revealed findings consistent with osteoarthritis.  No erosions were noted.  She was advised to notify us if she develops increased joint swelling.  She will follow-up in the office in 5 months.  Psoriasis: She has psoriasis on her abdomen.  She followed up with her dermatologist recently who prescribed to topical agents, which have been helping.   High risk medication use - Cosentyx 300 mg every 28 days, methotrexate 6 tablets every 7 days, and folic acid 1 mg daily.  Last TB gold negative on 06/24/2017.  Due for TB gold today and will monitor yearly.  Most recent CBC/CMP within normal limits on 06/01/2018.  Will monitor every 3 months and standing orders are in place. - Plan: QuantiFERON-TB Gold Plus  S/P total knee replacement, left - Left knee joint revision December 06, 2017, September 2019 manipulation.  She has warmth and limited extension of the left knee joint.  DDD (degenerative disc disease), lumbar - with spinal stenosis: She has discomfort with range of motion.  She has midline spinal tenderness in the lumbar region.  Primary osteoarthritis of both hands: She has PIP and DIP synovial thickening consistent with osteoarthritis of bilateral hands.  Joint protection and muscle strengthening were discussed.  Primary osteoarthritis of both feet: She has no discomfort in her feet at this time.  She wears proper fitting shoes.  Other medical conditions are listed as follows:  Thrombocytosis ([place])  History of gastric bypass   Orders: Orders Placed This Encounter  Procedures  . QuantiFERON-TB Gold Plus   Meds ordered this encounter  Medications  . methotrexate (RHEUMATREX) 2.5 MG tablet    Sig: Take 6 tablets (15 mg total) by mouth once a week. Caution:Chemotherapy. Protect from light.    Dispense:  72 tablet     Refill:  0      Follow-Up Instructions: Return in about 5 months (around 11/27/2018) for Psoriatic arthritis, Osteoarthritis, DDD.   Ofilia Neas, PA-C   I examined and evaluated the patient with Hazel Sams PA.  She complains of increased arthralgias in her hands.  She had no synovitis on my examination.  I believe most of the discomfort is coming from underlying osteoarthritis.  Use of diclofenac gel was discussed.  The plan of care was discussed as noted above.  Bo Merino, MD  Note - This record has been created using Editor, commissioning.  Chart creation errors have been sought, but may not always  have been located. Such creation errors do not reflect on  the standard of medical care.

## 2018-06-17 ENCOUNTER — Ambulatory Visit: Payer: MEDICARE | Admitting: Rheumatology

## 2018-06-27 ENCOUNTER — Encounter: Payer: Self-pay | Admitting: Rheumatology

## 2018-06-27 ENCOUNTER — Ambulatory Visit (INDEPENDENT_AMBULATORY_CARE_PROVIDER_SITE_OTHER): Payer: MEDICARE | Admitting: Rheumatology

## 2018-06-27 VITALS — BP 117/74 | HR 80 | Resp 14 | Ht 65.0 in | Wt 208.0 lb

## 2018-06-27 DIAGNOSIS — M19072 Primary osteoarthritis, left ankle and foot: Secondary | ICD-10-CM

## 2018-06-27 DIAGNOSIS — L4050 Arthropathic psoriasis, unspecified: Secondary | ICD-10-CM | POA: Diagnosis not present

## 2018-06-27 DIAGNOSIS — M51369 Other intervertebral disc degeneration, lumbar region without mention of lumbar back pain or lower extremity pain: Secondary | ICD-10-CM

## 2018-06-27 DIAGNOSIS — M19042 Primary osteoarthritis, left hand: Secondary | ICD-10-CM

## 2018-06-27 DIAGNOSIS — L409 Psoriasis, unspecified: Secondary | ICD-10-CM

## 2018-06-27 DIAGNOSIS — M19071 Primary osteoarthritis, right ankle and foot: Secondary | ICD-10-CM

## 2018-06-27 DIAGNOSIS — M5136 Other intervertebral disc degeneration, lumbar region: Secondary | ICD-10-CM

## 2018-06-27 DIAGNOSIS — Z79899 Other long term (current) drug therapy: Secondary | ICD-10-CM | POA: Diagnosis not present

## 2018-06-27 DIAGNOSIS — Z96652 Presence of left artificial knee joint: Secondary | ICD-10-CM | POA: Diagnosis not present

## 2018-06-27 DIAGNOSIS — Z9884 Bariatric surgery status: Secondary | ICD-10-CM

## 2018-06-27 DIAGNOSIS — D75839 Thrombocytosis, unspecified: Secondary | ICD-10-CM

## 2018-06-27 DIAGNOSIS — M19041 Primary osteoarthritis, right hand: Secondary | ICD-10-CM

## 2018-06-27 DIAGNOSIS — D473 Essential (hemorrhagic) thrombocythemia: Secondary | ICD-10-CM

## 2018-06-27 MED ORDER — METHOTREXATE 2.5 MG PO TABS: 15.0000 mg | ORAL_TABLET | ORAL | 0 refills | Status: DC

## 2018-06-29 LAB — QUANTIFERON-TB GOLD PLUS
MITOGEN-NIL: 8.18 [IU]/mL
NIL: 0.02 [IU]/mL
QUANTIFERON-TB GOLD PLUS: NEGATIVE
TB1-NIL: 0 [IU]/mL
TB2-NIL: 0 IU/mL

## 2018-06-29 NOTE — Progress Notes (Signed)
TB gold is negative.

## 2018-07-26 ENCOUNTER — Other Ambulatory Visit: Payer: Self-pay | Admitting: *Deleted

## 2018-07-26 DIAGNOSIS — L4050 Arthropathic psoriasis, unspecified: Secondary | ICD-10-CM

## 2018-07-26 DIAGNOSIS — L409 Psoriasis, unspecified: Secondary | ICD-10-CM

## 2018-07-26 MED ORDER — SECUKINUMAB (300 MG DOSE) 150 MG/ML ~~LOC~~ SOAJ: 300.0000 mg | SUBCUTANEOUS | 0 refills | Status: DC

## 2018-07-26 NOTE — Telephone Encounter (Signed)
Refill request received via fax  Last Visit: 06/27/18 Next visit due August 2020 Labs: 06/01/18 WNL TB Gold: 06/27/2018 Neg   Okay to refill per Dr. Estanislado Pandy

## 2018-08-25 ENCOUNTER — Ambulatory Visit: Payer: MEDICARE | Admitting: Rheumatology

## 2018-09-16 ENCOUNTER — Other Ambulatory Visit: Payer: Self-pay | Admitting: *Deleted

## 2018-09-16 DIAGNOSIS — Z79899 Other long term (current) drug therapy: Secondary | ICD-10-CM

## 2018-09-16 NOTE — Progress Notes (Deleted)
Office Visit Note  Patient: Connie Hoffman             Date of Birth: 1961-01-11           MRN: [id number]             PCP: Jenel Lucks, PA-C Referring: Burna Cash* Visit Date: 09/29/2018 Occupation: @GUAROCC @  Subjective:  No chief complaint on file.  She is on Cosentyx 300 mg every 28 days, methotrexate 4 tablets every 7 days, and folic acid 1 mg daily.  Most recent TB Gold negative on 06/27/2018 monitor yearly.  Most recent CBC/CMP within normal limits except for elevated LFTs on 09/16/2018.  She is to return in 1 month for repeat LFTs.  Will monitor CBC/CMP every 3 months and standing orders are in place.  She received her flu vaccine and Pneumovax 23 in December then 1 dose of Shingrix vaccine.  Recommend Prevnar 13 vaccine as indicated.  History of Present Illness: Connie Hoffman is a 58 y.o. female ***   Activities of Daily Living:  Patient reports morning stiffness for *** {minute/hour:19697}.   Patient {ACTIONS;DENIES/REPORTS:21021675::"Denies"} nocturnal pain.  Difficulty dressing/grooming: {ACTIONS;DENIES/REPORTS:21021675::"Denies"} Difficulty climbing stairs: {ACTIONS;DENIES/REPORTS:21021675::"Denies"} Difficulty getting out of chair: {ACTIONS;DENIES/REPORTS:21021675::"Denies"} Difficulty using hands for taps, buttons, cutlery, and/or writing: {ACTIONS;DENIES/REPORTS:21021675::"Denies"}  No Rheumatology ROS completed.   PMFS History:  Patient Active Problem List   Diagnosis Date Noted  . DDD (degenerative disc disease), lumbar 01/21/2018  . Primary osteoarthritis of both hands 01/21/2018  . Primary osteoarthritis of both feet 01/21/2018  . Psoriasis 12/01/2017  . Psoriatic arthritis ([place]) 12/01/2017  . High risk medication use 12/01/2017  . 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 Thrombocytosis ([place]) 12/01/2017  . 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 Psoriatic arthritis ([place])    Family History  Problem Relation Age of Onset  . Diabetes Mother   . Hypertension Mother   . Heart Problems Mother   . Congestive Heart Failure Mother   . Diabetes Father   . Hypertension Father   . Stroke Sister   . Heart Problems Sister   . Prostate cancer Brother   . Diabetes Brother   . Asthma Daughter   . Healthy Daughter   . Healthy Son   . Healthy Son    Past Surgical History:  Procedure Laterality Date  . ABDOMINAL HYSTERECTOMY    . CHOLECYSTECTOMY    . CYSTECTOMY  07/2017  . JOINT REPLACEMENT Left    left knee  . [place] RESECTION  2018  . LUMBAR EPIDURAL INJECTION  04/13/2018  . REVISION TOTAL KNEE ARTHROPLASTY Left 12/2017  . TOTAL SHOULDER ARTHROPLASTY Bilateral    Social History   Social History Narrative  . Not on file    There is no immunization history on file for this patient.   Objective: Vital Signs: There were no vitals taken for this visit.   Physical Exam   Musculoskeletal Exam: ***  CDAI Exam: CDAI Score: Not documented Patient Global Assessment: Not documented; Provider Global Assessment: Not documented Swollen: Not documented; Tender: Not documented Joint Exam   Not documented   There is currently no information documented on the homunculus. Go to the Rheumatology activity and complete the homunculus joint exam.  Investigation: No additional findings.  Imaging: No results found.  Recent Labs: Lab Results  Component Value Date   WBC 5.2 06/01/2018   HGB 11.9 06/01/2018   PLT 337 06/01/2018  NA 145 06/01/2018   K 4.3 06/01/2018   CL 110 06/01/2018   CO2 26 06/01/2018   GLUCOSE 97 06/01/2018   BUN 17 06/01/2018   CREATININE 0.66 06/01/2018   BILITOT 0.2 06/01/2018   AST 17 06/01/2018   ALT 22 06/01/2018   PROT 6.2 06/01/2018   CALCIUM 8.7 06/01/2018   GFRAA 115 06/01/2018   QFTBGOLDPLUS NEGATIVE 06/27/2018    Speciality Comments: No specialty comments available.  Procedures:  No procedures  performed Allergies: Aspirin   Assessment / Plan:     Visit Diagnoses: No diagnosis found.   Orders: No orders of the defined types were placed in this encounter.  No orders of the defined types were placed in this encounter.   Face-to-face time spent with patient was *** minutes. Greater than 50% of time was spent in counseling and coordination of care.  Follow-Up Instructions: No follow-ups on file.   Ofilia Neas, PA-C  Note - This record has been created using Dragon software.  Chart creation errors have been sought, but may not always  have been located. Such creation errors do not reflect on  the standard of medical care.

## 2018-09-17 LAB — COMPLETE METABOLIC PANEL WITH GFR
AG Ratio: 1.5 (calc) (ref 1.0–2.5)
ALT: 53 U/L — ABNORMAL HIGH (ref 6–29)
AST: 38 U/L — ABNORMAL HIGH (ref 10–35)
Albumin: 4 g/dL (ref 3.6–5.1)
Alkaline phosphatase (APISO): 84 U/L (ref 37–153)
BUN: 18 mg/dL (ref 7–25)
CO2: 28 mmol/L (ref 20–32)
Calcium: 9.4 mg/dL (ref 8.6–10.4)
Chloride: 107 mmol/L (ref 98–110)
Creat: 0.66 mg/dL (ref 0.50–1.05)
GFR, Est African American: 114 mL/min/{1.73_m2} (ref >=60)
GFR, Est Non African American: 99 mL/min/{1.73_m2} (ref >=60)
Globulin: 2.6 g/dL (calc) (ref 1.9–3.7)
Glucose, Bld: 105 mg/dL — ABNORMAL HIGH (ref 65–99)
Potassium: 4.5 mmol/L (ref 3.5–5.3)
Sodium: 143 mmol/L (ref 135–146)
Total Bilirubin: 0.4 mg/dL (ref 0.2–1.2)
Total Protein: 6.6 g/dL (ref 6.1–8.1)

## 2018-09-17 LAB — CBC WITH DIFFERENTIAL/PLATELET
Absolute Monocytes: 528 cells/uL (ref 200–950)
Basophils Absolute: 31 cells/uL (ref 0–200)
Basophils Relative: 0.7 %
Eosinophils Absolute: 101 cells/uL (ref 15–500)
Eosinophils Relative: 2.3 %
HCT: 38.7 % (ref 35.0–45.0)
Hemoglobin: 12.9 g/dL (ref 11.7–15.5)
Lymphs Abs: 1364 cells/uL (ref 850–3900)
MCH: 30.7 pg (ref 27.0–33.0)
MCHC: 33.3 g/dL (ref 32.0–36.0)
MCV: 92.1 fL (ref 80.0–100.0)
MPV: 10.9 fL (ref 7.5–12.5)
Monocytes Relative: 12 %
Neutro Abs: 2376 cells/uL (ref 1500–7800)
Neutrophils Relative %: 54 %
Platelets: 337 10*3/uL (ref 140–400)
RBC: 4.2 10*6/uL (ref 3.80–5.10)
RDW: 12.3 % (ref 11.0–15.0)
Total Lymphocyte: 31 %
WBC: 4.4 10*3/uL (ref 3.8–10.8)

## 2018-09-20 NOTE — Progress Notes (Signed)
Elevated LFTs.  Please advise patient to decrease methotrexate to 4 tablets/week.  Repeat LFTs in 1 month.

## 2018-09-21 ENCOUNTER — Telehealth: Payer: Self-pay | Admitting: *Deleted

## 2018-09-21 DIAGNOSIS — Z79899 Other long term (current) drug therapy: Secondary | ICD-10-CM

## 2018-09-21 MED ORDER — METHOTREXATE 2.5 MG PO TABS: 10.0000 mg | ORAL_TABLET | ORAL | 0 refills | Status: DC

## 2018-09-21 NOTE — Telephone Encounter (Signed)
-----   Message from Bo Merino, MD sent at 09/20/2018 12:49 PM EDT ----- Elevated LFTs.  Please advise patient to decrease methotrexate to 4 tablets/week.  Repeat LFTs in 1 month.

## 2018-09-28 ENCOUNTER — Other Ambulatory Visit: Payer: Self-pay

## 2018-09-28 ENCOUNTER — Ambulatory Visit (INDEPENDENT_AMBULATORY_CARE_PROVIDER_SITE_OTHER): Payer: 59 | Admitting: Rheumatology

## 2018-09-28 ENCOUNTER — Encounter: Payer: Self-pay | Admitting: Rheumatology

## 2018-09-28 VITALS — BP 121/70 | HR 63 | Resp 13 | Ht 65.0 in | Wt 225.6 lb

## 2018-09-28 DIAGNOSIS — M19042 Primary osteoarthritis, left hand: Secondary | ICD-10-CM

## 2018-09-28 DIAGNOSIS — L409 Psoriasis, unspecified: Secondary | ICD-10-CM

## 2018-09-28 DIAGNOSIS — D75839 Thrombocytosis, unspecified: Secondary | ICD-10-CM

## 2018-09-28 DIAGNOSIS — L4050 Arthropathic psoriasis, unspecified: Secondary | ICD-10-CM

## 2018-09-28 DIAGNOSIS — D473 Essential (hemorrhagic) thrombocythemia: Secondary | ICD-10-CM

## 2018-09-28 DIAGNOSIS — Z9884 Bariatric surgery status: Secondary | ICD-10-CM

## 2018-09-28 DIAGNOSIS — Z79899 Other long term (current) drug therapy: Secondary | ICD-10-CM | POA: Diagnosis not present

## 2018-09-28 DIAGNOSIS — M5136 Other intervertebral disc degeneration, lumbar region: Secondary | ICD-10-CM

## 2018-09-28 DIAGNOSIS — Z96652 Presence of left artificial knee joint: Secondary | ICD-10-CM

## 2018-09-28 DIAGNOSIS — M19071 Primary osteoarthritis, right ankle and foot: Secondary | ICD-10-CM

## 2018-09-28 DIAGNOSIS — M19072 Primary osteoarthritis, left ankle and foot: Secondary | ICD-10-CM

## 2018-09-28 DIAGNOSIS — M19041 Primary osteoarthritis, right hand: Secondary | ICD-10-CM

## 2018-09-28 MED ORDER — METHOTREXATE 2.5 MG PO TABS: 10.0000 mg | ORAL_TABLET | ORAL | 0 refills | Status: DC

## 2018-09-28 NOTE — Patient Instructions (Signed)
Standing Labs We placed an order today for your standing lab work.    Please come back and get your standing labs at the end of June and every 3 months   We have open lab daily Monday through Thursday from 8:30-12:30 PM and 1:30-4:30 PM and Friday from 8:30-12:30 PM and 1:30 -4:00 PM at the office of Dr. Bo Merino.   You may experience shorter wait times on Monday and Friday afternoons. The office is located at [address], [place], [place], [place] [address] No appointment is necessary.   Labs are drawn by [facility].  You may receive a bill from [facility] for your lab work.  If you wish to have your labs drawn at another location, please call the office 24 hours in advance to send orders.  If you have any questions regarding directions or hours of operation,  please call [phone].   Just as a reminder please drink plenty of water prior to coming for your lab work. Thanks!

## 2018-09-28 NOTE — Progress Notes (Signed)
Office Visit Note  Patient: Connie Hoffman             Date of Birth: 07-Apr-1961           MRN: [id number]             PCP: Jenel Lucks, PA-C Referring: Burna Cash* Visit Date: 09/28/2018 Occupation: @GUAROCC @  Subjective:  Pain in both hands   History of Present Illness: Connie Hoffman is a 58 y.o. female with history of psoriatic arthritis, osteoarthritis, and DDD.  She is currently on Cosentyx 300 mg subcutaneous injection every 28 days, methotrexate 4 tablets by mouth once weekly, and folic acid 1 mg by mouth daily.  She was advised to reduce her dose of methotrexate from 6 tablets by mouth once weekly to 4 tablets by mouth once weekly due to recent elevation in LFTs.   Today was the first dose of 4 tablets of methotrexate. She denies missing any doses of MTX or cosentyx recently.  She denies any recent infections. She reports that she had a flare in bilateral hands 5 days ago that resolved yesterday.  She states that she took Tylenol for pain relief.  She states she also soaked her hands in a warm Epson salt bath.  She has intermittent lower back pain and right SI joint pain.  She has occasional left ankle joint pain and swelling.  She states her psoriasis has cleared with using topical agents.   Activities of Daily Living:  Patient reports morning stiffness for 20 minutes.   Patient Reports nocturnal pain.  Difficulty dressing/grooming: Denies Difficulty climbing stairs: Denies Difficulty getting out of chair: Denies Difficulty using hands for taps, buttons, cutlery, and/or writing: Reports  Review of Systems  Constitutional: Positive for fatigue.  HENT: Negative for mouth sores, mouth dryness and nose dryness.   Eyes: Positive for itching and dryness. Negative for pain and visual disturbance.  Respiratory: Negative for cough, hemoptysis, shortness of breath and difficulty breathing.   Cardiovascular: Negative for chest pain, palpitations,  hypertension and swelling in legs/feet.  Gastrointestinal: Negative for blood in stool, constipation and diarrhea.  Endocrine: Negative for increased urination.  Genitourinary: Negative for painful urination.  Musculoskeletal: Positive for arthralgias, joint pain, joint swelling and morning stiffness. Negative for myalgias, muscle weakness, muscle tenderness and myalgias.  Skin: Negative for color change, pallor, rash, hair loss, nodules/bumps, skin tightness, ulcers and sensitivity to sunlight.  Allergic/Immunologic: Negative for susceptible to infections.  Neurological: Negative for dizziness, numbness, headaches and weakness.  Hematological: Negative for swollen glands.  Psychiatric/Behavioral: Positive for sleep disturbance. Negative for depressed mood. The patient is not nervous/anxious.     PMFS History:  Patient Active Problem List   Diagnosis Date Noted   DDD (degenerative disc disease), lumbar 01/21/2018   Primary osteoarthritis of both hands 01/21/2018   Primary osteoarthritis of both feet 01/21/2018   Psoriasis 12/01/2017   Psoriatic arthritis ([place]) 12/01/2017   High risk medication use 12/01/2017   S/P total knee replacement, 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   Thrombocytosis ([place]) 12/01/2017   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    Past Medical History:  Diagnosis Date   Psoriatic arthritis ([place])     Family History  Problem Relation Age of Onset   Diabetes Mother    Hypertension Mother    Heart Problems Mother    Congestive Heart Failure Mother    Diabetes Father    Hypertension Father    Stroke Sister    Heart Problems Sister  Prostate cancer Brother    Diabetes Brother    Asthma Daughter    Healthy Daughter    Healthy Son    Healthy Son    Past Surgical History:  Procedure Laterality Date   ABDOMINAL HYSTERECTOMY     CHOLECYSTECTOMY     CYSTECTOMY  07/2017   JOINT REPLACEMENT Left    left knee   LAPAROSCOPIC GASTRIC SLEEVE RESECTION   2018   LUMBAR EPIDURAL INJECTION  04/13/2018   REVISION TOTAL KNEE ARTHROPLASTY Left 12/2017   TOTAL SHOULDER ARTHROPLASTY Bilateral    Social History   Social History Narrative   Not on file   Immunization History  Administered Date(s) Administered   Influenza,inj,Quad PF,6+ Mos 04/15/2018   Pneumococcal Polysaccharide-23 04/15/2018   Zoster Recombinat (Shingrix) 05/13/2018     Objective: Vital Signs: BP 121/70 (BP Location: Right Arm, Patient Position: Sitting, Cuff Size: Normal)    Pulse 63    Resp 13    Ht 5\' 5"  (1.651 m)    Wt 225 lb 9.6 oz (102.3 kg)    BMI 37.54 kg/m    Physical Exam Vitals signs and nursing note reviewed.  Constitutional:      Appearance: She is well-developed.  HENT:     Head: Normocephalic and atraumatic.  Eyes:     Conjunctiva/sclera: Conjunctivae normal.  Neck:     Musculoskeletal: Normal range of motion.  Cardiovascular:     Rate and Rhythm: Normal rate and regular rhythm.     Heart sounds: Normal heart sounds.  Pulmonary:     Effort: Pulmonary effort is normal.     Breath sounds: Normal breath sounds.  Abdominal:     General: Bowel sounds are normal.     Palpations: Abdomen is soft.  Lymphadenopathy:     Cervical: No cervical adenopathy.  Skin:    General: Skin is warm and dry.     Capillary Refill: Capillary refill takes less than 2 seconds.     Comments: No psoriasis noted.  No nail dystrophy noted.  Neurological:     Mental Status: She is alert and oriented to person, place, and time.  Psychiatric:        Behavior: Behavior normal.      Musculoskeletal Exam: C-spine good ROM.  Limited extension of lumbar spine.  Mild midline spinal tenderness in lumbar region.  Right SI joint tenderness.  Shoulder joints, elbow joints, wrist joints, MCPs, PIPs, and DIPs good ROM with no synovitis.  Complete fist formation bilaterally.  Hip joints, knee joints, ankle joints, MTPs, PIPs, and DIPs good ROM with no synovitis.  Warmth of left  knee replacement noted. No tenderness or swelling of ankle joints.    CDAI Exam: CDAI Score: Not documented Patient Global Assessment: Not documented; Provider Global Assessment: Not documented Swollen: Not documented; Tender: Not documented Joint Exam   Not documented   There is currently no information documented on the homunculus. Go to the Rheumatology activity and complete the homunculus joint exam.  Investigation: No additional findings.  Imaging: No results found.  Recent Labs: Lab Results  Component Value Date   WBC 4.4 09/16/2018   HGB 12.9 09/16/2018   PLT 337 09/16/2018   NA 143 09/16/2018   K 4.5 09/16/2018   CL 107 09/16/2018   CO2 28 09/16/2018   GLUCOSE 105 (H) 09/16/2018   BUN 18 09/16/2018   CREATININE 0.66 09/16/2018   BILITOT 0.4 09/16/2018   AST 38 (H) 09/16/2018   ALT 53 (H) 09/16/2018  PROT 6.6 09/16/2018   CALCIUM 9.4 09/16/2018   GFRAA 114 09/16/2018   QFTBGOLDPLUS NEGATIVE 06/27/2018    Speciality Comments: No specialty comments available.  Procedures:  No procedures performed Allergies: Aspirin   Assessment / Plan:     Visit Diagnoses: Psoriatic arthritis ([place]): She has no synovitis or dactylitis on exam.  She had a psoriatic arthritis flare 5 days ago that resolved yesterday.  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  She has no tenderness or synovitis on exam today.  She has complete fist formation bilaterally.  She has no achilles tendonitis or plantar fasciitis.  She has mild right SI joint tenderness.  She continues to inject Cosentyx 300 mg subcutaneously every 28 days and takes methotrexate 4 tabs by mouth once weekly.  She had lab work on 09/16/2018 revealed elevated LFTs and she was advised to reduce her methotrexate from 6 tablets by mouth once a day to 4 tablets by mouth once weekly.  Today was the first day she took 4 tablets of  methotrexate.  She has been taking Tylenol for pain relief.  She was advised to avoid NSAIDs, alcohol, and Tylenol.  She will return at the end of the month to recheck LFTs.  Future orders are in place.  She will continue on the current treatment regimen.  A refill of methotrexate was sent to the pharmacy today.  She was advised to notify us if develops increased joint pain or joint swelling.  She will follow-up in the office in 5 months.  Psoriasis: She has no active psoriasis at this time.  Her psoriasis cleared using topical agents. No nail dystrophy or nail pitting was noted.   High risk medication use - Cosentyx 300 mg sq injections every 28 days, MTX 4 tablets by mouth once weekly, folic acid 1 mg po daily.  CBC was within normal notes on 09/16/2018.  AST and ALT were elevated on 09/16/2018 patient was advised to reduce methotrexate from 6 tablets by mouth once weekly to 4 tablets by mouth once weekly.  A future order for AST and ALT were placed and she will return at the end of this month to recheck lab work.  TB gold was negative on 06/27/2018.  She was advised to hold Cosentyx and methotrexate if she develops signs or symptoms of infection and to resume once infection has completely cleared.  Discussed importance of social distancing and following a standard precautions recommended by the CDC.  S/P total knee replacement, left - Left knee joint revision December 06, 2017, September 2019 manipulation: She has warmth and limited extension of the left knee joint.  She has intermittent discomfort in the left knee.  DDD (degenerative disc disease), lumbar - with spinal stenosis:  She has slightly limited extension of the lumbar spine.  She has midline spinal tenderness in the lumbar region.  She has no symptoms of radiculopathy at this time.   Primary osteoarthritis of both hands: She has no tenderness or synovitis at this time.  She has complete fist formation bilaterally.  Joint protection and muscle  strengthening were discussed.   Primary osteoarthritis of both feet: She has no feet pain or joint swelling at this time.  She wears proper fitting shoes.   Other medical conditions are listed as follows:   History of gastric bypass  Thrombocytosis ([place])   Orders: No orders of the defined types were  placed in this encounter.  Meds ordered this encounter  Medications   methotrexate (RHEUMATREX) 2.5 MG tablet    Sig: Take 4 tablets (10 mg total) by mouth once a week. Caution:Chemotherapy. Protect from light.    Dispense:  48 tablet    Refill:  0    Face-to-face time spent with patient was 30 minutes. Greater than 50% of time was spent in counseling and coordination of care.  Follow-Up Instructions: Return in about 5 months (around 02/28/2019) for Psoriatic arthritis, Osteoarthritis, DDD.   Connie Neas, PA-C   I examined and evaluated the patient with Connie Sams PA.  Symptoms have resolved now.  She had no synovitis on examination today.  She wants to continue on the current regimen for now.  The plan of care was discussed as noted above.  Bo Merino, MD  Note - This record has been created using Editor, commissioning.  Chart creation errors have been sought, but may not always  have been located. Such creation errors do not reflect on  the standard of medical care.

## 2018-09-29 ENCOUNTER — Ambulatory Visit: Payer: Self-pay | Admitting: Rheumatology

## 2018-10-17 ENCOUNTER — Other Ambulatory Visit: Payer: Self-pay

## 2018-10-17 DIAGNOSIS — Z79899 Other long term (current) drug therapy: Secondary | ICD-10-CM

## 2018-10-18 LAB — AST: AST: 21 U/L (ref 10–35)

## 2018-10-18 LAB — ALT: ALT: 27 U/L (ref 6–29)

## 2018-10-18 NOTE — Progress Notes (Signed)
LFTs are normal. Stay on reduced dose of MTX.

## 2018-10-20 ENCOUNTER — Other Ambulatory Visit: Payer: Self-pay | Admitting: *Deleted

## 2018-10-20 DIAGNOSIS — L409 Psoriasis, unspecified: Secondary | ICD-10-CM

## 2018-10-20 DIAGNOSIS — L4050 Arthropathic psoriasis, unspecified: Secondary | ICD-10-CM

## 2018-10-20 MED ORDER — COSENTYX SENSOREADY (300 MG) 150 MG/ML ~~LOC~~ SOAJ: 300.0000 mg | SUBCUTANEOUS | 0 refills | Status: DC

## 2018-10-20 NOTE — Telephone Encounter (Signed)
Refill request received via fax  Last Visit: 09/28/18 Next Visit: 02/28/19 Labs: 09/16/18 Elevated LFTs Tb Gold: 06/27/18 Neg   Okay to refill per Dr. Estanislado Pandy

## 2018-10-22 ENCOUNTER — Encounter (HOSPITAL_COMMUNITY): Payer: Self-pay | Admitting: Emergency Medicine

## 2018-10-22 ENCOUNTER — Other Ambulatory Visit: Payer: Self-pay

## 2018-10-22 ENCOUNTER — Emergency Department (HOSPITAL_COMMUNITY)
Admission: EM | Admit: 2018-10-22 | Discharge: 2018-10-22 | Disposition: A | Discharge status: Home / Self Care | Payer: MEDICARE | Attending: Emergency Medicine | Admitting: Emergency Medicine

## 2018-10-22 ENCOUNTER — Emergency Department (HOSPITAL_COMMUNITY): Payer: MEDICARE

## 2018-10-22 DIAGNOSIS — Z79899 Other long term (current) drug therapy: Secondary | ICD-10-CM | POA: Insufficient documentation

## 2018-10-22 DIAGNOSIS — M25551 Pain in right hip: Secondary | ICD-10-CM | POA: Diagnosis not present

## 2018-10-22 DIAGNOSIS — L4050 Arthropathic psoriasis, unspecified: Secondary | ICD-10-CM | POA: Insufficient documentation

## 2018-10-22 DIAGNOSIS — Y939 Activity, unspecified: Secondary | ICD-10-CM | POA: Diagnosis not present

## 2018-10-22 DIAGNOSIS — W109XXA Fall (on) (from) unspecified stairs and steps, initial encounter: Secondary | ICD-10-CM | POA: Insufficient documentation

## 2018-10-22 DIAGNOSIS — Y929 Unspecified place or not applicable: Secondary | ICD-10-CM | POA: Insufficient documentation

## 2018-10-22 DIAGNOSIS — Y999 Unspecified external cause status: Secondary | ICD-10-CM | POA: Diagnosis not present

## 2018-10-22 DIAGNOSIS — M25531 Pain in right wrist: Secondary | ICD-10-CM

## 2018-10-22 DIAGNOSIS — M25511 Pain in right shoulder: Secondary | ICD-10-CM | POA: Diagnosis not present

## 2018-10-22 DIAGNOSIS — M25532 Pain in left wrist: Secondary | ICD-10-CM | POA: Diagnosis not present

## 2018-10-22 DIAGNOSIS — M25521 Pain in right elbow: Secondary | ICD-10-CM | POA: Insufficient documentation

## 2018-10-22 DIAGNOSIS — W19XXXA Unspecified fall, initial encounter: Secondary | ICD-10-CM

## 2018-10-22 DIAGNOSIS — T07XXXA Unspecified multiple injuries, initial encounter: Secondary | ICD-10-CM | POA: Diagnosis present

## 2018-10-22 IMAGING — CR DG HIP (WITH OR WITHOUT PELVIS) 2-3V RIGHT
3 series · 3 of 3 positions shown · non-contrast
Comparison: None

CLINICAL DATA: Fell today at church, RIGHT shoulder pain, RIGHT
elbow and wrist pain, LEFT anterior wrist pain with bruising, RIGHT
hip pain, initial encounter

EXAM:
DG HIP (WITH OR WITHOUT PELVIS) 2-3V RIGHT

[pelvis ap]
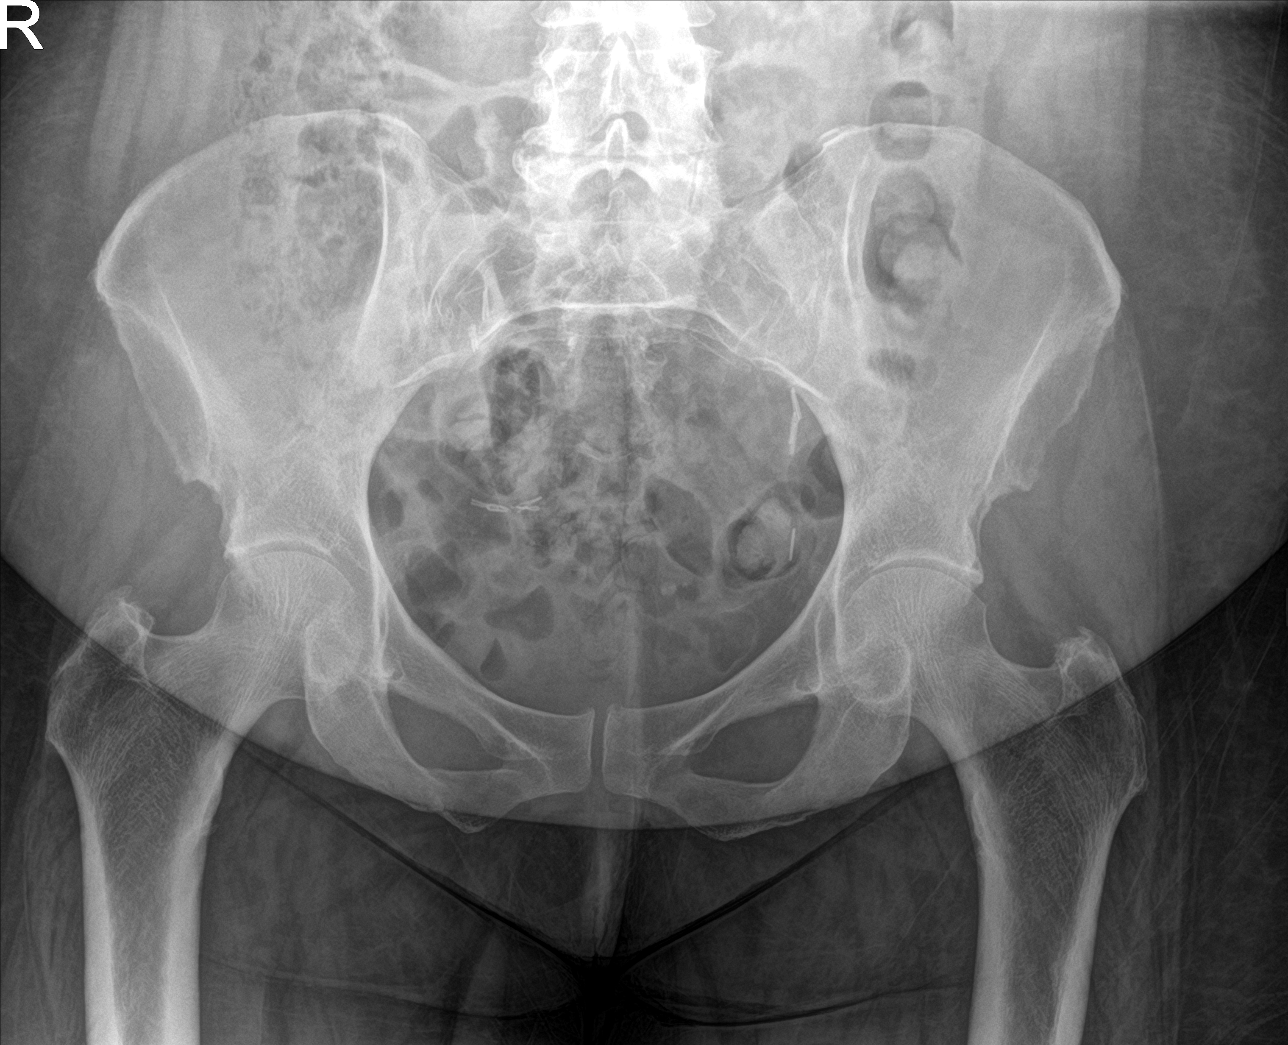

[hip ap]
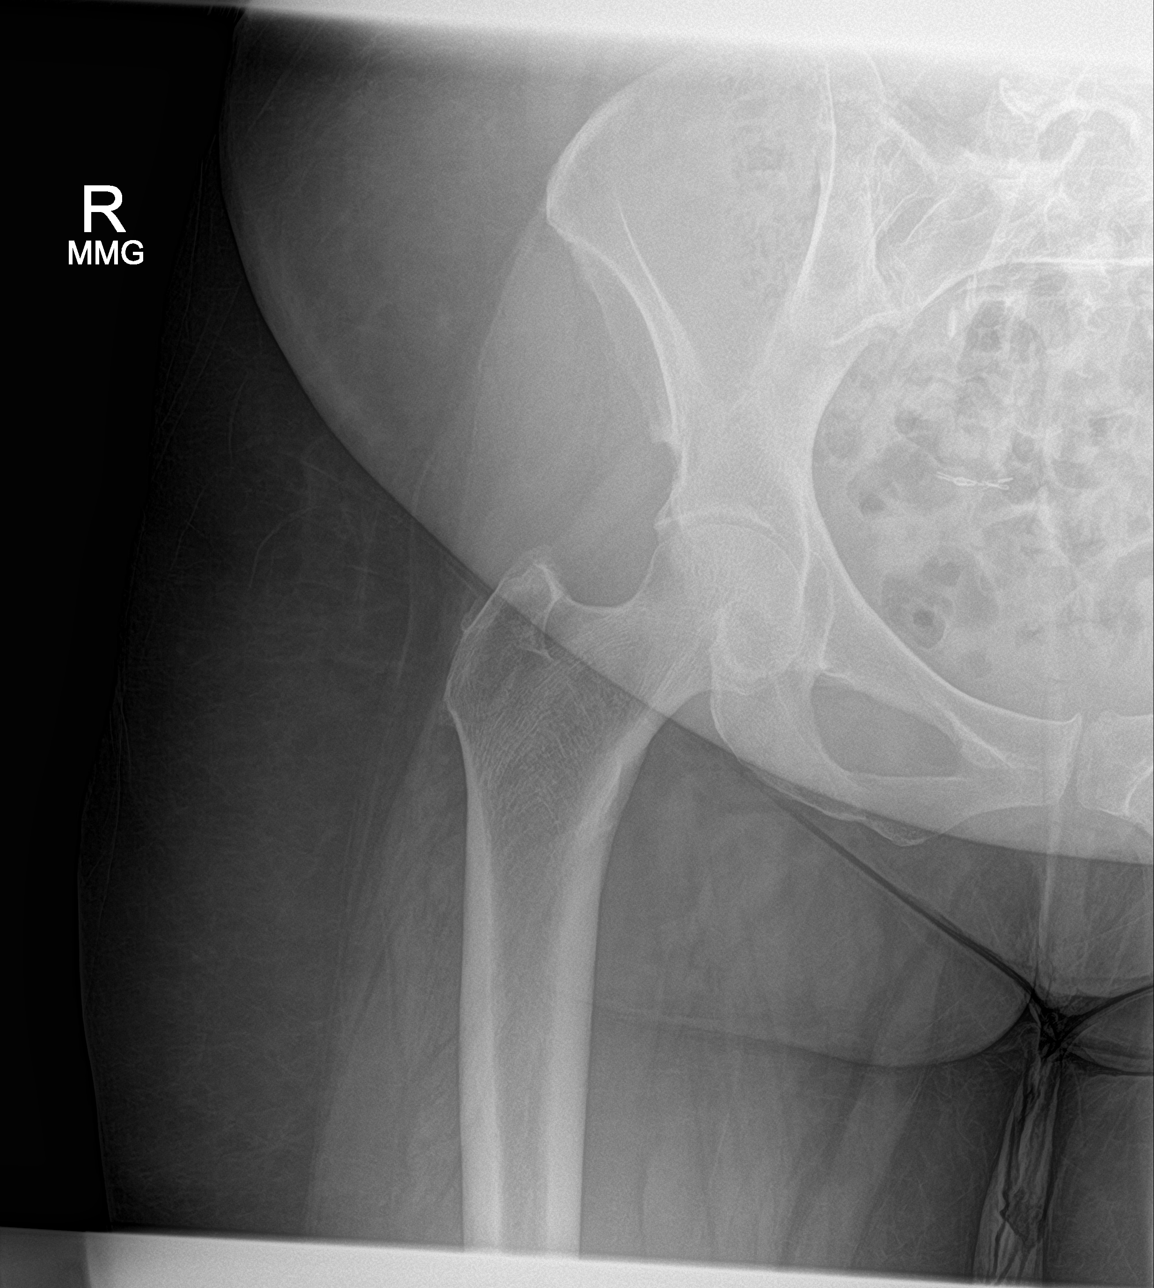

[hip lat]
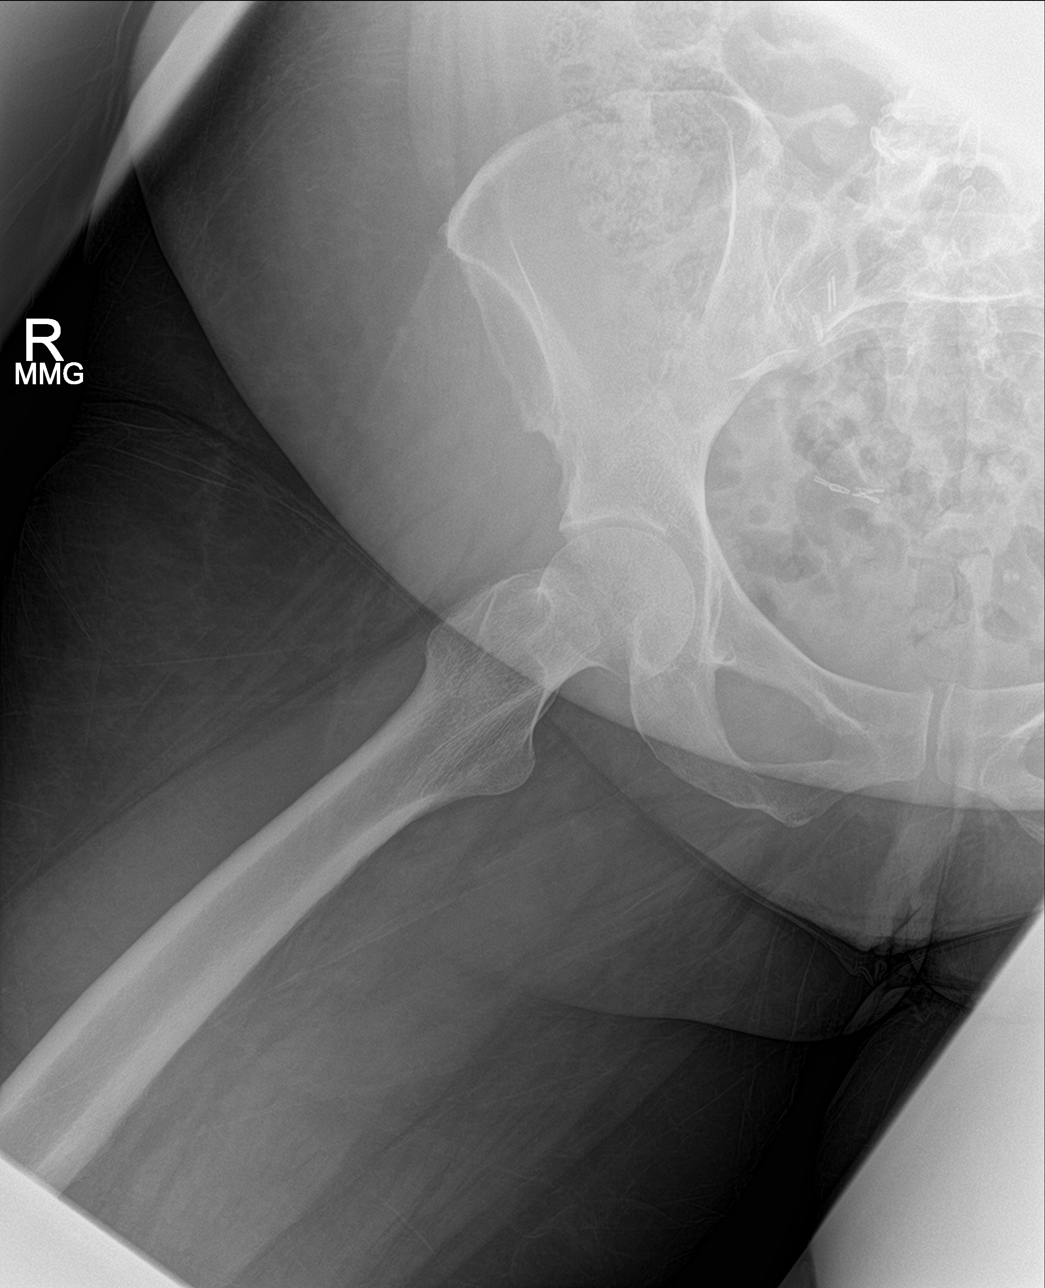

[3 of 3 positions shown; findings below may reference images not displayed]

FINDINGS: Osseous mineralization appears diffusely decreased.

Minimal narrowing of hip joints bilaterally.

SI joints grossly preserved.

No acute fracture, dislocation, or bone destruction.
IMPRESSION: No acute osseous abnormalities.

## 2018-10-22 IMAGING — CR RIGHT WRIST - COMPLETE 3+ VIEW
4 series · 4 of 4 positions shown · non-contrast
Comparison: None

CLINICAL DATA: Fell today at church, RIGHT shoulder pain, RIGHT
elbow and wrist pain, LEFT anterior wrist pain with bruising, RIGHT
hip pain, initial encounter

EXAM:
RIGHT WRIST - COMPLETE 3+ VIEW

[wrist pa]
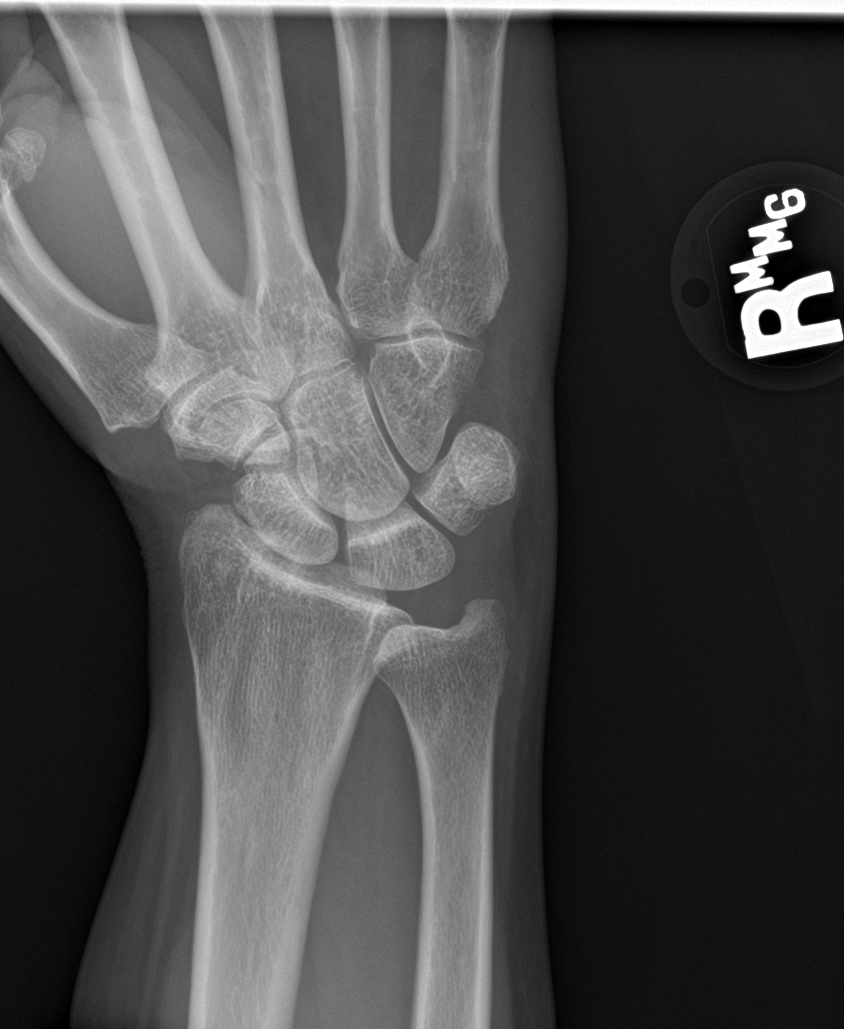

[wrist obl]
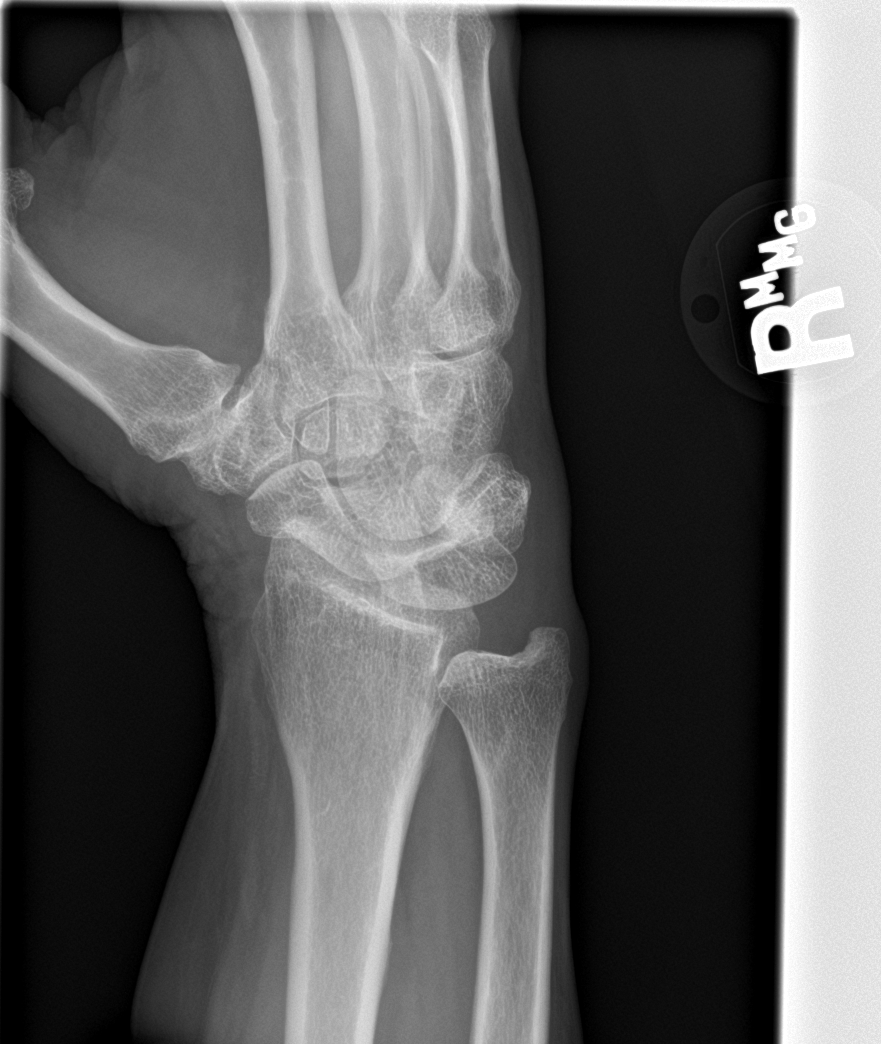

[wrist lat]
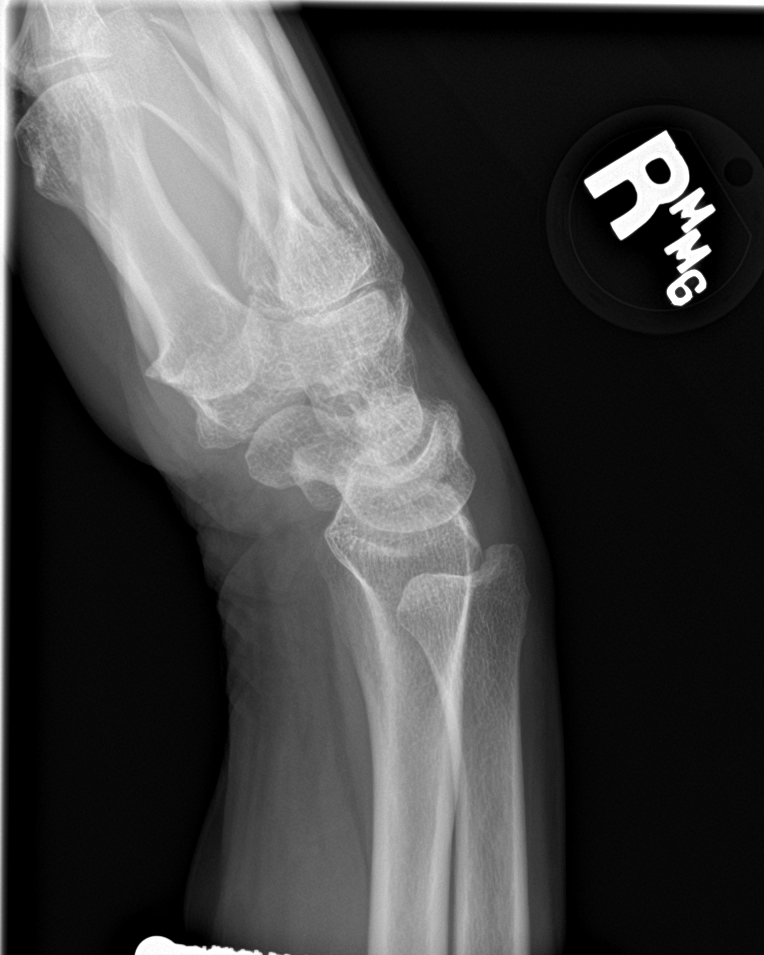

[wrist navicular]
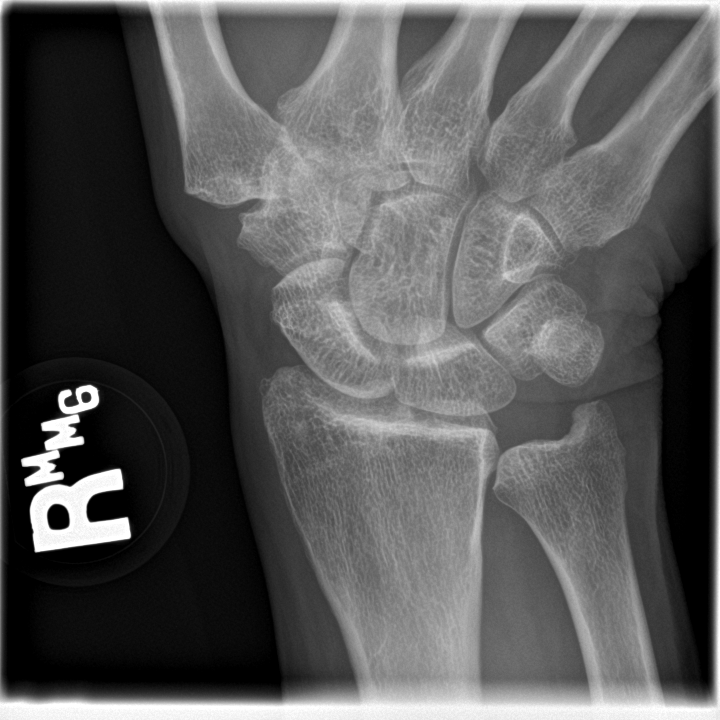

[4 of 4 positions shown; findings below may reference images not displayed]

FINDINGS: Osseous demineralization.

Minimal radiocarpal joint space narrowing.

No acute fracture, dislocation, or bone destruction.
IMPRESSION: No acute osseous abnormalities.

## 2018-10-22 IMAGING — CR RIGHT ELBOW - COMPLETE 3+ VIEW
4 series · 4 of 4 positions shown · non-contrast
Comparison: None

CLINICAL DATA: Fell today at church, RIGHT shoulder pain, RIGHT
elbow and wrist pain, LEFT anterior wrist pain with bruising, RIGHT
hip pain, initial encounter

EXAM:
RIGHT ELBOW - COMPLETE 3+ VIEW

[elbow ap]
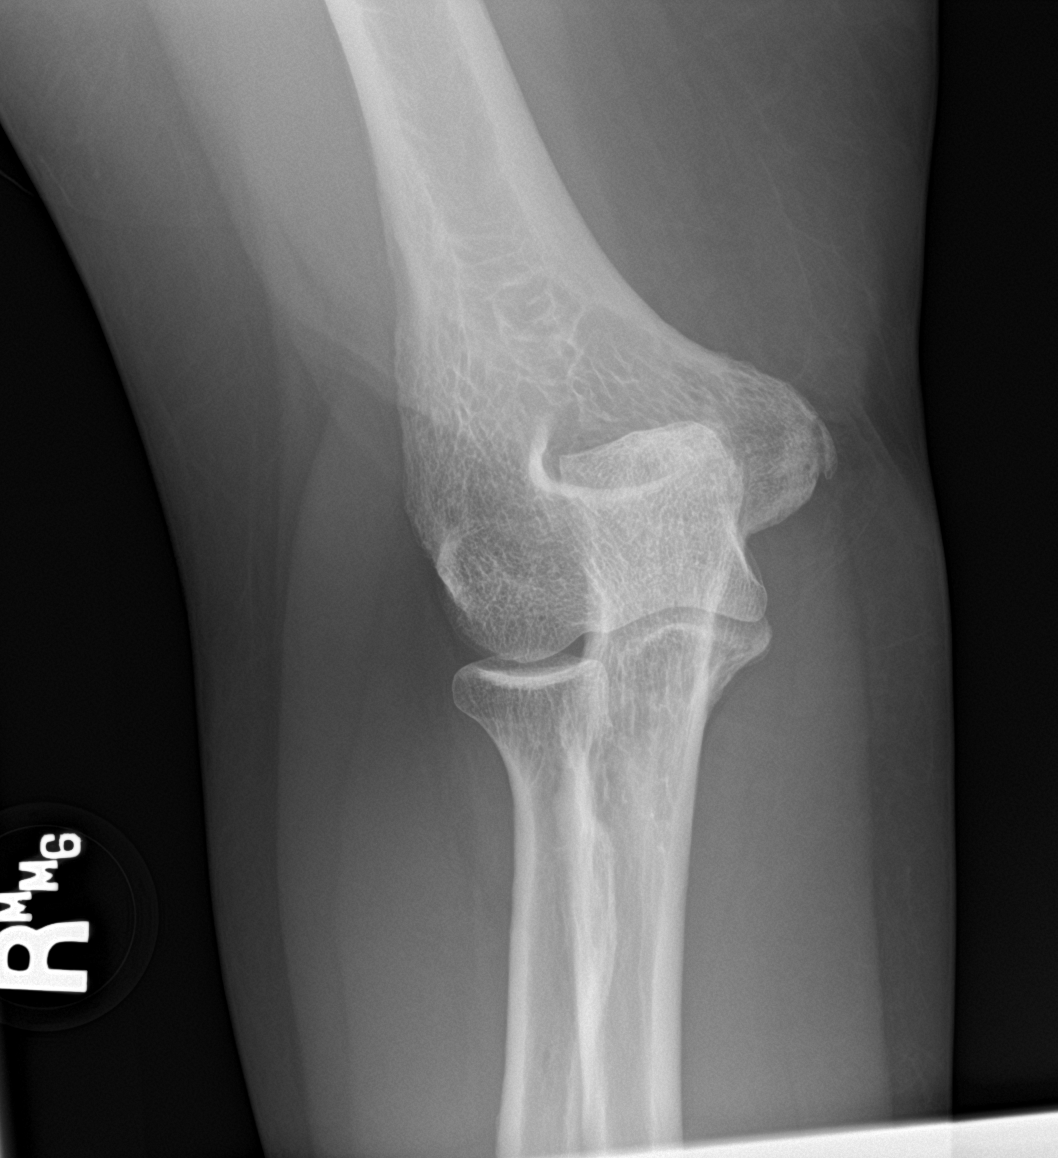

[elbow obl (1 of 2)]
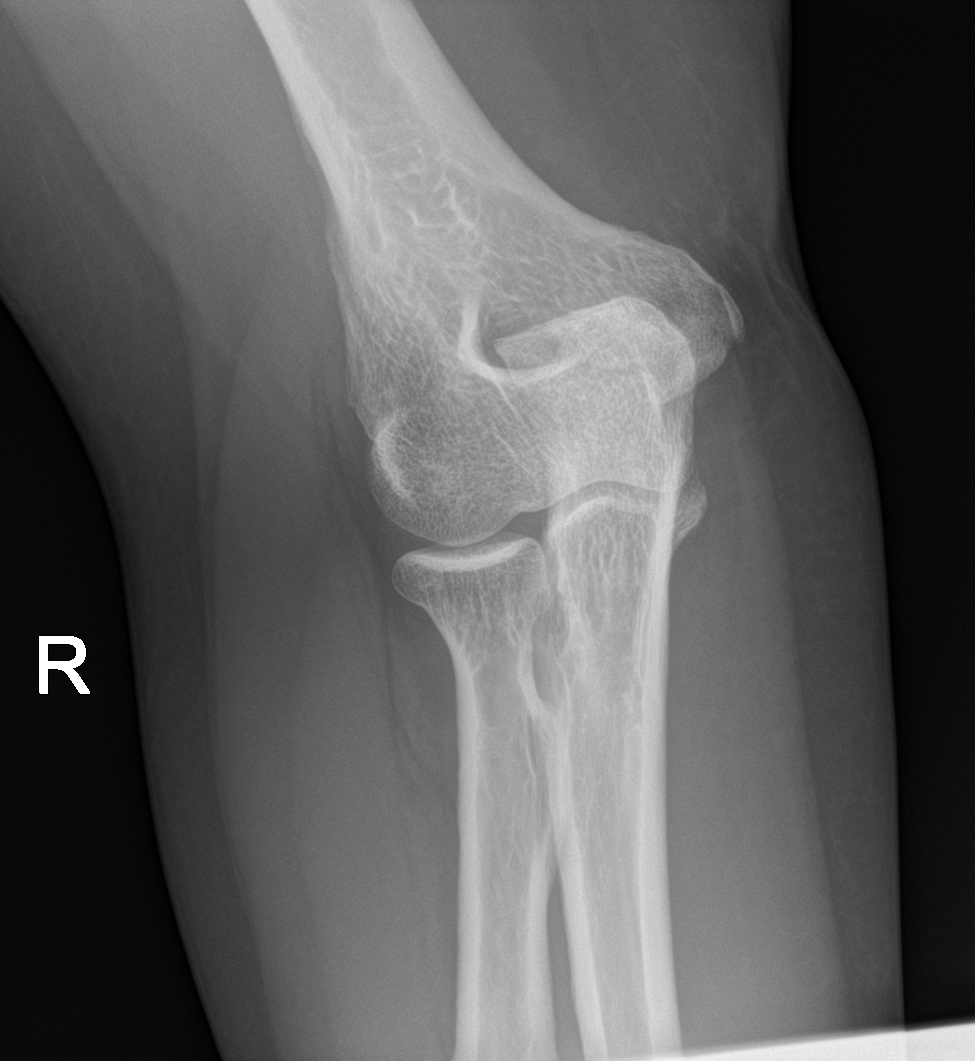

[elbow obl (2 of 2)]
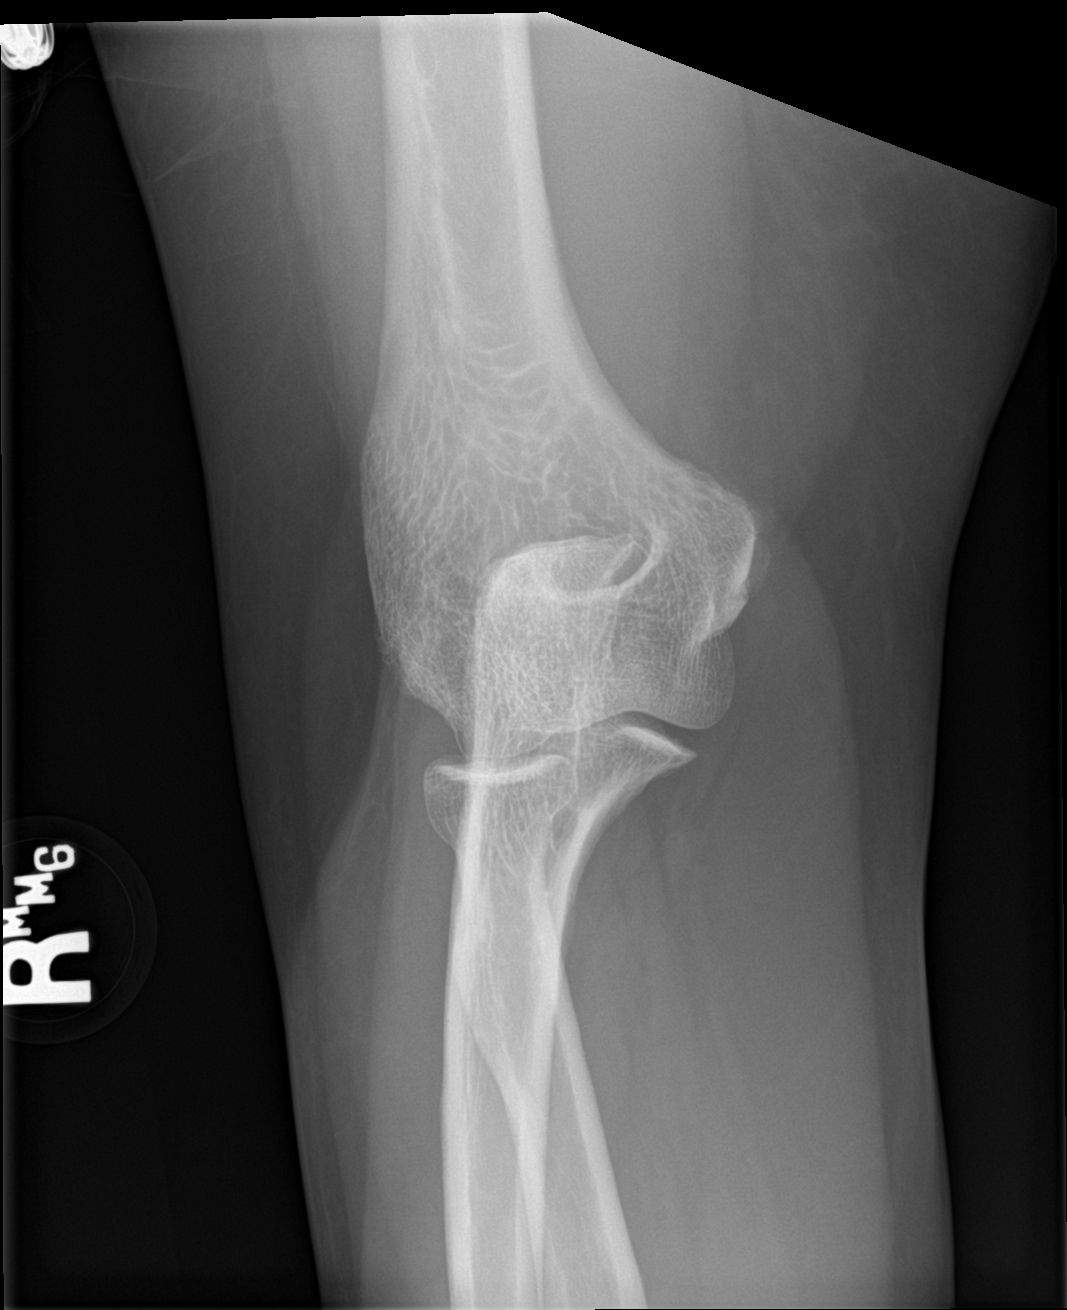

[elbow lat]
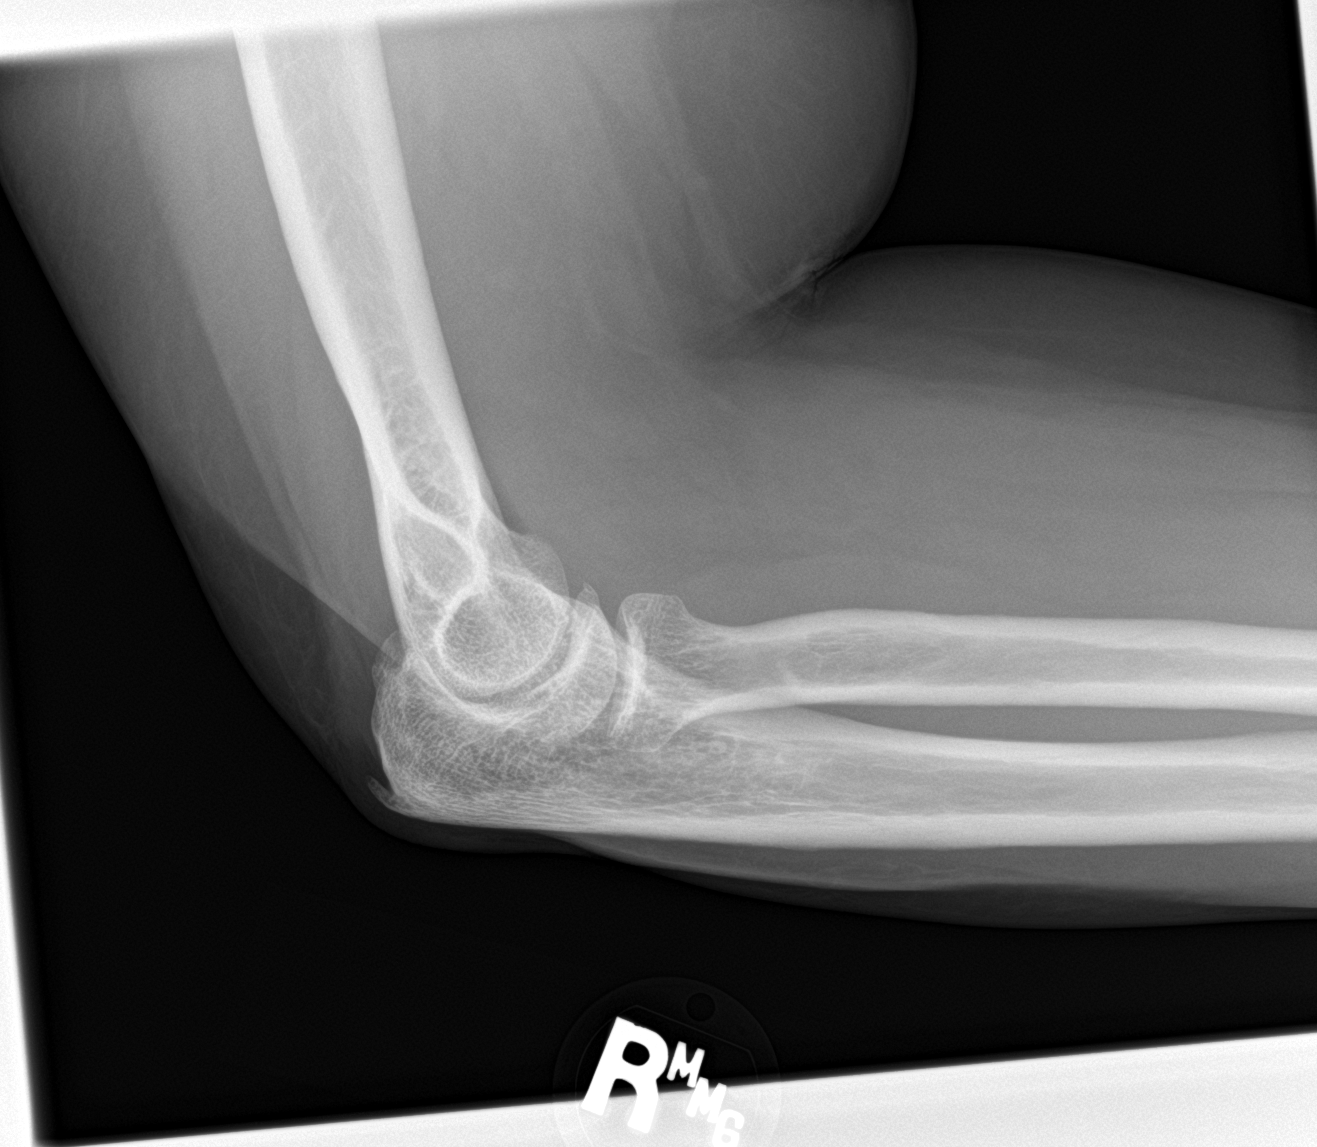

[4 of 4 positions shown; findings below may reference images not displayed]

FINDINGS: Osseous mineralization low normal.

Joint spaces preserved.

No acute fracture, dislocation, or bone destruction.

Tiny olecranon spur.

No joint effusion.
IMPRESSION: No acute osseous abnormalities.

## 2018-10-22 IMAGING — CR RIGHT SHOULDER - 2+ VIEW
2 series · 2 of 2 positions shown · non-contrast
Comparison: None

CLINICAL DATA: Fell today at church, RIGHT shoulder pain, RIGHT
elbow and wrist pain, LEFT anterior wrist pain with bruising, RIGHT
hip pain, initial encounter

EXAM:
RIGHT SHOULDER - 2+ VIEW

[shoulder grashey]
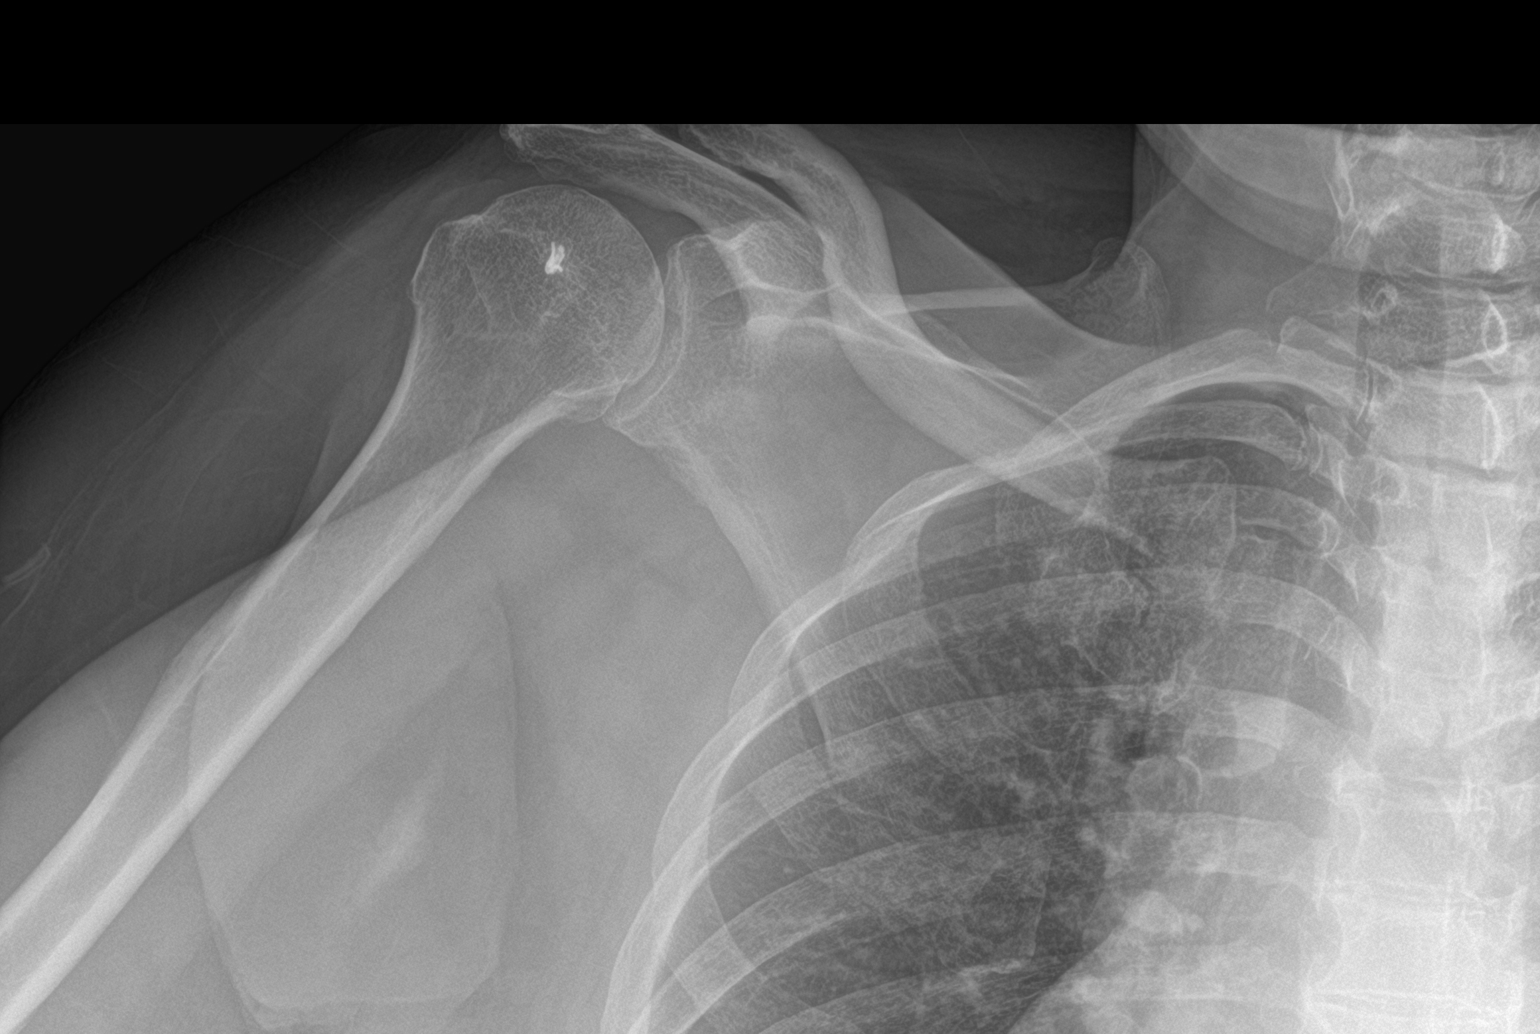

[shoulder y view]
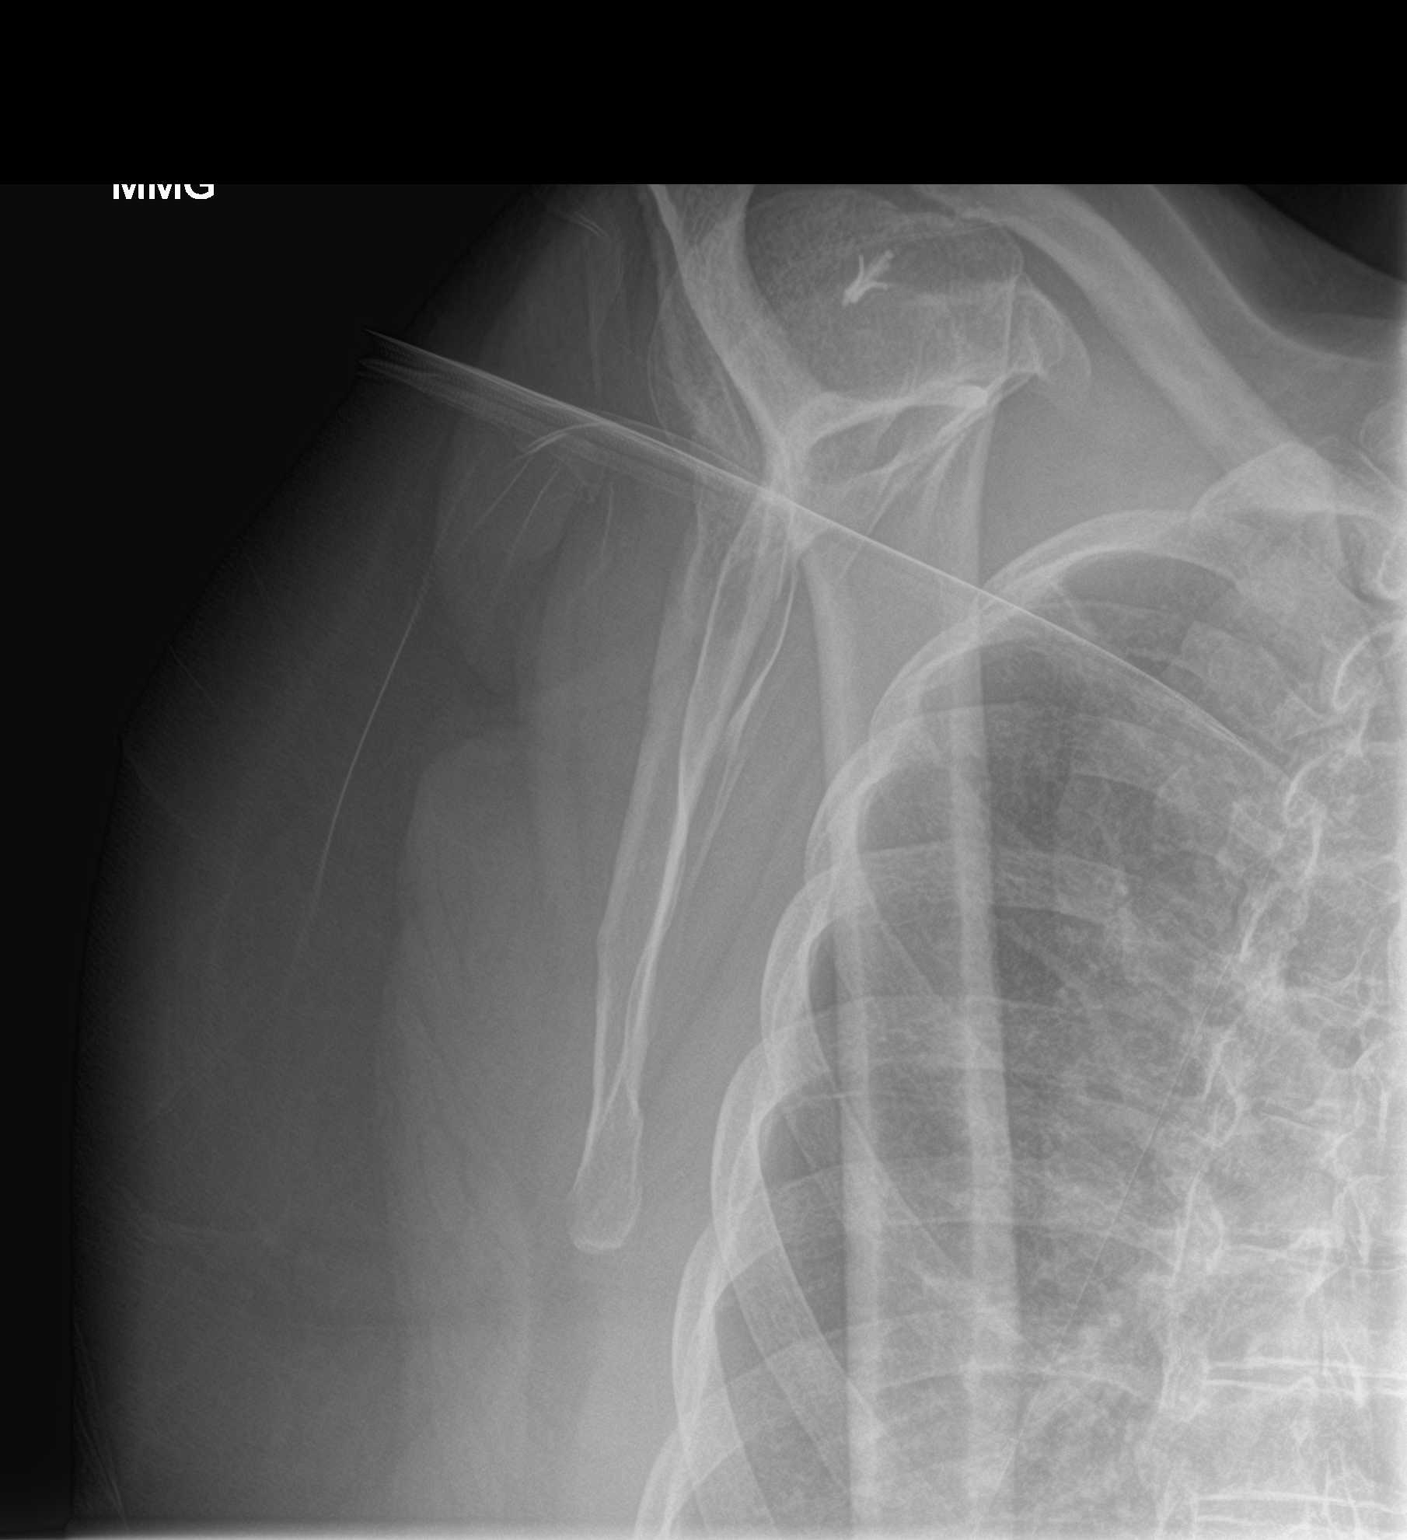

[2 of 2 positions shown; findings below may reference images not displayed]

FINDINGS: Osseous demineralization.

AC joint alignment normal.

Anchor at greater tuberosity suggesting prior rotator cuff repair.

Visualized ribs intact.

No acute fracture, dislocation, or bone destruction.
IMPRESSION: No acute osseous abnormalities.

## 2018-10-22 MED ORDER — ACETAMINOPHEN 500 MG PO TABS
1000.0000 mg | ORAL_TABLET | Freq: Once | ORAL | Status: AC
  Administered 2018-10-22: 1000 mg via ORAL
  Filled 2018-10-22: qty 2

## 2018-10-22 MED ORDER — OXYCODONE HCL 5 MG PO TABS
5.0000 mg | ORAL_TABLET | Freq: Once | ORAL | Status: AC
  Administered 2018-10-22: 5 mg via ORAL
  Filled 2018-10-22: qty 1

## 2018-10-22 NOTE — ED Triage Notes (Signed)
Pt here for mechanical fall on a step, has pain to left and right wrist, right knee, right shoulder.

## 2018-10-22 NOTE — ED Notes (Signed)
Connie Hoffman (husband) [phone]

## 2018-10-22 NOTE — ED Provider Notes (Signed)
[place] EMERGENCY DEPARTMENT Provider Note   CSN: [id number] Arrival date & time: 10/22/18  1749     History   Chief Complaint Chief Complaint  Patient presents with   Fall   Shoulder Injury   Wrist Pain    HPI Connie Hoffman is a 58 y.o. female.     The history is provided by the patient and medical records. No language interpreter was used.  Fall Pertinent negatives include no chest pain, no abdominal pain, no headaches and no shortness of breath.  Shoulder Injury Pertinent negatives include no chest pain, no abdominal pain, no headaches and no shortness of breath.  Wrist Pain Pertinent negatives include no chest pain, no abdominal pain, no headaches and no shortness of breath.   Connie Hoffman is a 58 y.o. female  with a PMH as listed below who presents to the Emergency Department for evaluation after mechanical fall just prior to arrival.  Patient states that she was going down the stairs, but did not realize there was 1 more step when she became unsteady on her feet and fell to her right side.  She caught herself with her left hand on a cabinet, but then fell onto her right hip and shoulder.  Pain is worse with certain movements and all the affected joints.  Denies any numbness or weakness.  No medications taken prior to arrival for symptoms.  She is followed by orthopedic clinic in [facility], but cannot remember the doctors name.  She does have all the information to follow-up with them at home and folder if needed.  She did not hit her head.  She is not having any neck or back pain.  No nausea or vomiting.  Past Medical History:  Diagnosis Date   Psoriatic arthritis [facility])     Patient Active Problem List   Diagnosis Date Noted   DDD (degenerative disc disease), lumbar 01/21/2018   Primary osteoarthritis of both hands 01/21/2018   Primary osteoarthritis of both feet 01/21/2018   Psoriasis 12/01/2017   Psoriatic arthritis ([place])  12/01/2017   High risk medication use 12/01/2017   S/P total knee replacement, 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   Thrombocytosis ([place]) 12/01/2017   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    Past Surgical History:  Procedure Laterality Date   ABDOMINAL HYSTERECTOMY     CHOLECYSTECTOMY     CYSTECTOMY  07/2017   JOINT REPLACEMENT Left    left knee   LAPAROSCOPIC GASTRIC SLEEVE RESECTION  2018   LUMBAR EPIDURAL INJECTION  04/13/2018   REVISION TOTAL KNEE ARTHROPLASTY Left 12/2017   TOTAL SHOULDER ARTHROPLASTY Bilateral      OB History   No obstetric history on file.      Home Medications    Prior to Admission medications   Medication Sig Start Date End Date Taking? Authorizing Provider  acetaminophen (TYLENOL) 650 MG CR tablet Take 650 mg by mouth every 8 (eight) hours as needed (for pain or headaches).     [provider]  folic acid (FOLVITE) 1 MG tablet Take 1 mg by mouth daily.    [provider]  Menthol, Topical Analgesic, (BIOFREEZE) 4 % GEL Apply 1 application topically See admin instructions. Apply to painful sites as directed    [provider]  methocarbamol (ROBAXIN) 500 MG tablet TAKE 1 TABLET BY MOUTH 4 TIMES A DAY AS NEEDED 12/30/17   [provider]  methotrexate (RHEUMATREX) 2.5 MG tablet Take 4 tablets (10 mg total) by mouth once  a week. Caution:Chemotherapy. Protect from light. 09/28/18   Ofilia Neas, PA-C  Multiple Vitamin (MULTIVITAMIN WITH MINERALS) TABS tablet Take 1 tablet by mouth daily.    [provider]  Secukinumab, 300 MG Dose, (COSENTYX SENSOREADY, 300 MG,) 150 MG/ML SOAJ Inject 300 mg into the skin every 28 (twenty-eight) days. 10/20/18   Bo Merino, MD  triamcinolone ointment (KENALOG) 0.1 % Apply 1 application topically 2 (two) times daily as needed (for itching). Prn  10/05/17   [provider]    Family History Family History  Problem Relation Age of Onset   Diabetes Mother    Hypertension  Mother    Heart Problems Mother    Congestive Heart Failure Mother    Diabetes Father    Hypertension Father    Stroke Sister    Heart Problems Sister    Prostate cancer Brother    Diabetes Brother    Asthma Daughter    Healthy Daughter    Healthy Son    Healthy Son     Social History Social History   Tobacco Use   Smoking status: Never Smoker   Smokeless tobacco: Never Used  Substance Use Topics   Alcohol use: Never    Frequency: Never   Drug use: Never     Allergies   Aspirin   Review of Systems Review of Systems  Respiratory: Negative for shortness of breath.   Cardiovascular: Negative for chest pain.  Gastrointestinal: Negative for abdominal pain, nausea and vomiting.  Musculoskeletal: Positive for arthralgias and myalgias.  Skin: Negative for color change and wound.  Neurological: Negative for dizziness, syncope, weakness, numbness and headaches.     Physical Exam Updated Vital Signs There were no vitals taken for this visit.  Physical Exam Vitals signs and nursing note reviewed.  Constitutional:      General: She is not in acute distress.    Appearance: She is well-developed.  HENT:     Head: Normocephalic and atraumatic.  Neck:     Musculoskeletal: Neck supple.     Comments: No midline or paraspinal tenderness.  Full range of motion without any difficulty. Cardiovascular:     Rate and Rhythm: Normal rate and regular rhythm.     Heart sounds: Normal heart sounds. No murmur.  Pulmonary:     Effort: Pulmonary effort is normal. No respiratory distress.     Breath sounds: Normal breath sounds.  Abdominal:     General: There is no distension.     Palpations: Abdomen is soft.     Tenderness: There is no abdominal tenderness.  Musculoskeletal:     Comments: No midline T/L-spine tenderness.  Tenderness to the right trapezius musculature and anterior shoulder.  She does have decreased range of motion to the shoulder likely secondary to  her pain.  No crepitus or step-off.  No obvious deformity of any extremity.  She does have diffuse tenderness to the forearm from the right elbow down to the wrist.  All compartments are soft.  There is no overlying skin change or obvious joint swelling.  She also has tenderness to the left radial aspect of her wrist.  Neither hand has any anatomical snuffbox tenderness.  She has good grip strength and pincer grasp bilaterally.  Tenderness to palpation of the right lateral hip and groin without overlying skin changes.  Full range of motion to bilateral lower extremities.  All 4 extremities with intact sensation and 2+ distal pulses.  Skin:    General: Skin is warm  and dry.  Neurological:     Mental Status: She is alert and oriented to person, place, and time.      ED Treatments / Results  Labs (all labs ordered are listed, but only abnormal results are displayed) Labs Reviewed - No data to display  EKG    Radiology Dg Shoulder Right  Result Date: 10/22/2018 CLINICAL DATA:  [place] today at church, RIGHT shoulder pain, RIGHT elbow and wrist pain, LEFT anterior wrist pain with bruising, RIGHT hip pain, initial encounter EXAM: RIGHT SHOULDER - 2+ VIEW COMPARISON:  None FINDINGS: Osseous demineralization. AC joint alignment normal. Anchor at greater tuberosity suggesting prior rotator cuff repair. Visualized ribs intact. No acute fracture, dislocation, or bone destruction. IMPRESSION: No acute osseous abnormalities. Electronically Signed   By: Lavonia Dana M.D.   On: 10/22/2018 19:00   Dg Elbow Complete Right  Result Date: 10/22/2018 CLINICAL DATA:  [place] today at church, RIGHT shoulder pain, RIGHT elbow and wrist pain, LEFT anterior wrist pain with bruising, RIGHT hip pain, initial encounter EXAM: RIGHT ELBOW - COMPLETE 3+ VIEW COMPARISON:  None FINDINGS: Osseous mineralization low normal. Joint spaces preserved. No acute fracture, dislocation, or bone destruction. Tiny olecranon spur. No joint  effusion. IMPRESSION: No acute osseous abnormalities. Electronically Signed   By: Lavonia Dana M.D.   On: 10/22/2018 19:00   Dg Wrist Complete Left  Result Date: 10/22/2018 CLINICAL DATA:  [place] today at church, RIGHT shoulder pain, RIGHT elbow and wrist pain, LEFT anterior wrist pain with bruising, RIGHT hip pain, initial encounter EXAM: LEFT WRIST - COMPLETE 3+ VIEW COMPARISON:  None FINDINGS: Osseous demineralization. Joint spaces preserved. No acute fracture, dislocation, or bone destruction. IMPRESSION: No acute osseous abnormalities. Electronically Signed   By: Lavonia Dana M.D.   On: 10/22/2018 18:57   Dg Wrist Complete Right  Result Date: 10/22/2018 CLINICAL DATA:  [place] today at church, RIGHT shoulder pain, RIGHT elbow and wrist pain, LEFT anterior wrist pain with bruising, RIGHT hip pain, initial encounter EXAM: RIGHT WRIST - COMPLETE 3+ VIEW COMPARISON:  None FINDINGS: Osseous demineralization. Minimal radiocarpal joint space narrowing. No acute fracture, dislocation, or bone destruction. IMPRESSION: No acute osseous abnormalities. Electronically Signed   By: Lavonia Dana M.D.   On: 10/22/2018 19:01   Dg Hip Unilat W Or Wo Pelvis 2-3 Views Right  Result Date: 10/22/2018 CLINICAL DATA:  [place] today at church, RIGHT shoulder pain, RIGHT elbow and wrist pain, LEFT anterior wrist pain with bruising, RIGHT hip pain, initial encounter EXAM: DG HIP (WITH OR WITHOUT PELVIS) 2-3V RIGHT COMPARISON:  None FINDINGS: Osseous mineralization appears diffusely decreased. Minimal narrowing of hip joints bilaterally. SI joints grossly preserved. No acute fracture, dislocation, or bone destruction. IMPRESSION: No acute osseous abnormalities. Electronically Signed   By: Lavonia Dana M.D.   On: 10/22/2018 18:58    Procedures Procedures (including critical care time)  Medications Ordered in ED Medications  acetaminophen (TYLENOL) tablet 1,000 mg (1,000 mg Oral Given 10/22/18 1821)  oxyCODONE (Oxy IR/ROXICODONE)  immediate release tablet 5 mg (5 mg Oral Given 10/22/18 1821)     Initial Impression / Assessment and Plan / ED Course  I have reviewed the triage vital signs and the nursing notes.  Pertinent labs & imaging results that were available during my care of the patient were reviewed by me and considered in my medical decision making (see chart for details).       Connie Hoffman is a 58 y.o. female who presents to ED for evaluation  after mechanical fall.  No head injury.  Complaining of right upper extremity pain, left wrist pain and right hip pain.  All extremities are neurovascularly intact.  X-rays reviewed without any acute findings.  Likely soreness from the fall.  She does have an orthopedist whom she can follow-up with if she is not feeling better.  She has muscle relaxants at home.  Encouraged Tylenol and ibuprofen as needed.  Reasons to return to the emergency department were discussed and all questions answered.  Final Clinical Impressions(s) / ED Diagnoses   Final diagnoses:  Acute pain of right shoulder  Bilateral wrist pain  Fall, initial encounter    ED Discharge Orders    None       Dillard Pascal, Ozella Almond, PA-C 10/22/18 1924    Lajean Saver, MD 10/23/18 [phone]

## 2018-10-22 NOTE — Discharge Instructions (Signed)
It was my pleasure taking care of you today!   Call your orthopedic doctor if you are not starting to feel better by Monday or Tuesday.   Alternate between Tylenol and Ibuprofen as needed for pain.   Sling as needed for comfort. Don't wear longer than three days.   Return to ER for new or worsening symptoms, any additional concerns.

## 2018-12-29 NOTE — Progress Notes (Signed)
Office Visit Note  Patient: Connie Hoffman             Date of Birth: 31-Jul-1960           MRN: [id number]             PCP: Jenel Lucks, PA-C Referring: Burna Cash* Visit Date: 12/30/2018 Occupation: @GUAROCC @  Subjective:  Pain in both hands   History of Present Illness: Rylynne Sanzari is a 58 y.o. female with history of psoriatic arthritis, osteoarthritis, and DDD.  She is on Cosentyx 300 mg sq every 28 days and takes Methotrexate 4 tablets by mouth once weekly.  She has not missed any doses recently.  She is on low dose MTX due to elevated LFTs.  She reports that she takes Tylenol as needed for pain relief.  She presents today with pain in both hands, right knee, and left ankle joint.  She has intermittent swelling in both hands.  She has warmth in the right knee joint.  She has limited ROM and chronic pain in both shoulder joints, which have been operated on in the past. She finished a medrol dosepak about 2 weeks ago prescribed by her orthopedist.      Activities of Daily Living:  Patient reports morning stiffness for 30 minutes.   Patient Reports nocturnal pain.  Difficulty dressing/grooming: Reports Difficulty climbing stairs: Reports Difficulty getting out of chair: Reports Difficulty using hands for taps, buttons, cutlery, and/or writing: Reports  Review of Systems  Constitutional: Positive for fatigue.  HENT: Negative for mouth sores, mouth dryness and nose dryness.   Eyes: Positive for itching and dryness.  Respiratory: Negative for shortness of breath, wheezing and difficulty breathing.   Cardiovascular: Negative for chest pain and palpitations.  Gastrointestinal: Negative for blood in stool, constipation and diarrhea.  Endocrine: Negative for increased urination.  Genitourinary: Negative for difficulty urinating and painful urination.  Musculoskeletal: Positive for arthralgias, joint pain, joint swelling and morning stiffness.  Skin:  Negative for rash.  Allergic/Immunologic: Negative for susceptible to infections.  Neurological: Positive for headaches and weakness. Negative for dizziness and memory loss.  Hematological: Negative for swollen glands.  Psychiatric/Behavioral: Positive for sleep disturbance. Negative for confusion.    PMFS History:  Patient Active Problem List   Diagnosis Date Noted  . DDD (degenerative disc disease), lumbar 01/21/2018  . Primary osteoarthritis of both hands 01/21/2018  . Primary osteoarthritis of both feet 01/21/2018  . Psoriasis 12/01/2017  . Psoriatic arthritis ([place]) 12/01/2017  . High risk medication use 12/01/2017  . 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 Thrombocytosis ([place]) 12/01/2017  . 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 Psoriatic arthritis ([place])     Family History  Problem Relation Age of Onset  . Diabetes Mother   . Hypertension Mother   . Heart Problems Mother   . Congestive Heart Failure Mother   . Diabetes Father   . Hypertension Father   . Stroke Sister   . Heart Problems Sister   . Prostate cancer Brother   . Diabetes Brother   . Asthma Daughter   . Healthy Daughter   . Healthy Son   . Healthy Son    Past Surgical History:  Procedure Laterality Date  . ABDOMINAL HYSTERECTOMY    . CHOLECYSTECTOMY    . CYSTECTOMY  07/2017  . JOINT REPLACEMENT Left    left knee  . [place] RESECTION  2018  .  LUMBAR EPIDURAL INJECTION  04/13/2018  . REVISION TOTAL KNEE ARTHROPLASTY Left 12/2017  . TOTAL SHOULDER ARTHROPLASTY Bilateral    Social History   Social History Narrative  . Not on file   Immunization History  Administered Date(s) Administered  . Influenza,inj,Quad PF,6+ Mos 04/15/2018  . Pneumococcal Polysaccharide-23 04/15/2018  . Zoster Recombinat (Shingrix) 05/13/2018     Objective: Vital Signs: BP 124/77 (BP Location: Left Wrist, Patient Position: Sitting, Cuff Size:  Normal)   Pulse 71   Resp 15   Ht 5\' 5"  (1.651 m)   Wt 240 lb 6.4 oz (109 kg)   BMI 40.00 kg/m    Physical Exam Vitals signs and nursing note reviewed.  Constitutional:      Appearance: She is well-developed.  HENT:     Head: Normocephalic and atraumatic.  Eyes:     Conjunctiva/sclera: Conjunctivae normal.  Neck:     Musculoskeletal: Normal range of motion.  Cardiovascular:     Rate and Rhythm: Normal rate and regular rhythm.     Heart sounds: Normal heart sounds.  Pulmonary:     Effort: Pulmonary effort is normal.     Breath sounds: Normal breath sounds.  Abdominal:     General: Bowel sounds are normal.     Palpations: Abdomen is soft.  Lymphadenopathy:     Cervical: No cervical adenopathy.  Skin:    General: Skin is warm and dry.     Capillary Refill: Capillary refill takes less than 2 seconds.  Neurological:     Mental Status: She is alert and oriented to person, place, and time.  Psychiatric:        Behavior: Behavior normal.      Musculoskeletal Exam: C-spine, thoracic spine, and lumbar spine good ROM.  No midline spinal tenderness.  No SI joint tenderness.  Limited abduction of both shoulder joints to 90 degrees.  Elbow joints, wrist joints, MCPs, PIPs, and DIPs good ROM with no synovitis.  Right 2nd MCP.  PIP joint synovial thickening. Right knee warmth but no effusion.  Left knee replacement good ROM.  Tenderness of the left ankle joint.    CDAI Exam: CDAI Score: - Patient Global: -; Provider Global: - Swollen: -; Tender: - Joint Exam   No joint exam has been documented for this visit   There is currently no information documented on the homunculus. Go to the Rheumatology activity and complete the homunculus joint exam.  Investigation: No additional findings.  Imaging: No results found.  Recent Labs: Lab Results  Component Value Date   WBC 4.4 09/16/2018   HGB 12.9 09/16/2018   PLT 337 09/16/2018   NA 143 09/16/2018   K 4.5 09/16/2018   CL 107  09/16/2018   CO2 28 09/16/2018   GLUCOSE 105 (H) 09/16/2018   BUN 18 09/16/2018   CREATININE 0.66 09/16/2018   BILITOT 0.4 09/16/2018   AST 21 10/17/2018   ALT 27 10/17/2018   PROT 6.6 09/16/2018   CALCIUM 9.4 09/16/2018   GFRAA 114 09/16/2018   QFTBGOLDPLUS NEGATIVE 06/27/2018    Speciality Comments: No specialty comments available.  Procedures:  No procedures performed Allergies: Aspirin   Assessment / Plan:     Visit Diagnoses: Psoriatic arthritis [facility]): She presents today with right knee joint warmth but no effusion noted.  She has been having increased pain in bilateral hands, the right knee joint, and left ankle joint.  She has no synovitis in her hands or left ankle joint.  She has no Achilles  tendinitis or plantar fasciitis at this time.  No SI joint tenderness.  She has no active psoriasis.   She completed a Medrol Dosepak about 2 weeks ago which was prescribed by her orthopedist.She has been injecting Cosentyx 300 mg subcutaneously every 28 days and taking methotrexate 4 tablets by mouth once weekly.  She is on low-dose methotrexate due to history of elevated LFTs.  LFTs were within normal limits on 10/17/2018.  We discussed increasing the dose of methotrexate to 6 tablets by mouth once weekly due to today's dose being effective in the past.  She was advised to avoid taking Tylenol which she has been taking as needed.  We also discussed avoiding NSAIDs and alcohol.  We will recheck lab work in 1 month.  She was advised to notify us if she develops increased joint pain or joint swelling.  She will follow-up in the office in 1 month.  Psoriasis: She has no psoriasis at this time.   High risk medication use - Cosentyx 300 mg sq injections every 28 days, MTX 4 tablets by mouth once weekly, folic acid 1 mg po daily.  She has been on low-dose methotrexate due to history of elevated LFTs.  LFTs were within normal limits on 10/17/2018.  She will increase methotrexate to 6 tablets by mouth  once weekly.  She was advised to avoid Tylenol, NSAIDs, and alcohol.  We will recheck lab work in 1 month.  Standing orders are in place.  S/P total knee replacement, left - Left knee joint revision December 06, 2017, September 2019 manipulation: She has no discomfort at this time.  DDD (degenerative disc disease), lumbar - with spinal stenosis: She has no increased discomfort at this time.   Primary osteoarthritis of both hands: She has PIP and DIP synovial thickening consistent with osteoarthritis of bilateral hands.  She has no synovitis on exam.  She has complete this formation bilaterally.  She has chronic pain in both hands and takes Tylenol as needed for pain relief.  She was advised to avoid taking Tylenol due to history of elevated LFTs.  Primary osteoarthritis of both feet: She has no pain in her feet at this time.  She does have chronic left ankle joint pain but no tenderness or inflammation was noted on exam today.  Discussed importance of wearing proper fitting shoes.  Other medical conditions are listed as follows:   Thrombocytosis ([place])  History of gastric bypass  Orders: No orders of the defined types were placed in this encounter.  No orders of the defined types were placed in this encounter.    Follow-Up Instructions: Return in about 4 weeks (around 01/27/2019) for Psoriatic arthritis, Osteoarthritis, DDD.   Ofilia Neas, PA-C   I examined and evaluated the patient with Hazel Sams PA.  Patient complains of increased joint pain and discomfort.  She had some warmth on palpation of her right knee.  She was also complaining of discomfort in her hand but no synovitis was noted.  I believe most of the discomfort is coming from underlying osteoarthritis.  She complained of pain in her left ankle joint but no swelling was noted.  At this point we plan to continue on Cosentyx.  In the past methotrexate dose was decreased due to elevation of LFTs.  We will try to increase  methotrexate back to 6 tablets p.o. weekly.  I offered cortisone injection to her right knee joint which she declined.  The plan of care was discussed as noted above.  Bo Merino, MD  Note - This record has been created using Editor, commissioning.  Chart creation errors have been sought, but may not always  have been located. Such creation errors do not reflect on  the standard of medical care.

## 2018-12-30 ENCOUNTER — Ambulatory Visit (INDEPENDENT_AMBULATORY_CARE_PROVIDER_SITE_OTHER): Payer: Medicare Other | Admitting: Rheumatology

## 2018-12-30 ENCOUNTER — Encounter: Payer: Self-pay | Admitting: Rheumatology

## 2018-12-30 ENCOUNTER — Other Ambulatory Visit: Payer: Self-pay

## 2018-12-30 VITALS — BP 124/77 | HR 71 | Resp 15 | Ht 65.0 in | Wt 240.4 lb

## 2018-12-30 DIAGNOSIS — M19042 Primary osteoarthritis, left hand: Secondary | ICD-10-CM

## 2018-12-30 DIAGNOSIS — L409 Psoriasis, unspecified: Secondary | ICD-10-CM

## 2018-12-30 DIAGNOSIS — L4050 Arthropathic psoriasis, unspecified: Secondary | ICD-10-CM

## 2018-12-30 DIAGNOSIS — D473 Essential (hemorrhagic) thrombocythemia: Secondary | ICD-10-CM

## 2018-12-30 DIAGNOSIS — D75839 Thrombocytosis, unspecified: Secondary | ICD-10-CM

## 2018-12-30 DIAGNOSIS — Z96652 Presence of left artificial knee joint: Secondary | ICD-10-CM

## 2018-12-30 DIAGNOSIS — Z79899 Other long term (current) drug therapy: Secondary | ICD-10-CM | POA: Diagnosis not present

## 2018-12-30 DIAGNOSIS — M19041 Primary osteoarthritis, right hand: Secondary | ICD-10-CM

## 2018-12-30 DIAGNOSIS — M19072 Primary osteoarthritis, left ankle and foot: Secondary | ICD-10-CM

## 2018-12-30 DIAGNOSIS — Z9884 Bariatric surgery status: Secondary | ICD-10-CM

## 2018-12-30 DIAGNOSIS — M5136 Other intervertebral disc degeneration, lumbar region: Secondary | ICD-10-CM

## 2018-12-30 DIAGNOSIS — M19071 Primary osteoarthritis, right ankle and foot: Secondary | ICD-10-CM

## 2018-12-30 MED ORDER — METHOTREXATE 2.5 MG PO TABS: ORAL_TABLET | ORAL | 0 refills | Status: DC

## 2019-01-13 NOTE — Progress Notes (Deleted)
Office Visit Note  Patient: Connie Hoffman             Date of Birth: 1960-09-07           MRN: [id number]             PCP: Jenel Lucks, PA-C Referring: Burna Cash* Visit Date: 01/27/2019 Occupation: @GUAROCC @  Subjective:  No chief complaint on file.  Cosentyx 300 mg every 28 days, methotrexate 6 tablets every 7 days, and folic acid 1 mg daily.  Last TB gold negative on 06/27/2018 and will monitor yearly.  Most recent CBC/CMP within normal limits except for elevated LFT on 01/17/2019.  History of Present Illness: Connie Hoffman is a 58 y.o. female ***   Activities of Daily Living:  Patient reports morning stiffness for *** {minute/hour:19697}.   Patient {ACTIONS;DENIES/REPORTS:21021675::"Denies"} nocturnal pain.  Difficulty dressing/grooming: {ACTIONS;DENIES/REPORTS:21021675::"Denies"} Difficulty climbing stairs: {ACTIONS;DENIES/REPORTS:21021675::"Denies"} Difficulty getting out of chair: {ACTIONS;DENIES/REPORTS:21021675::"Denies"} Difficulty using hands for taps, buttons, cutlery, and/or writing: {ACTIONS;DENIES/REPORTS:21021675::"Denies"}  No Rheumatology ROS completed.   PMFS History:  Patient Active Problem List   Diagnosis Date Noted  . DDD (degenerative disc disease), lumbar 01/21/2018  . Primary osteoarthritis of both hands 01/21/2018  . Primary osteoarthritis of both feet 01/21/2018  . Psoriasis 12/01/2017  . Psoriatic arthritis ([place]) 12/01/2017  . High risk medication use 12/01/2017  . 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 Thrombocytosis ([place]) 12/01/2017  . 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 Psoriatic arthritis ([place])     Family History  Problem Relation Age of Onset  . Diabetes Mother   . Hypertension Mother   . Heart Problems Mother   . Congestive Heart Failure Mother   . Diabetes Father   . Hypertension Father   . Stroke Sister   . Heart Problems Sister   .  Prostate cancer Brother   . Diabetes Brother   . Asthma Daughter   . Healthy Daughter   . Healthy Son   . Healthy Son    Past Surgical History:  Procedure Laterality Date  . ABDOMINAL HYSTERECTOMY    . CHOLECYSTECTOMY    . CYSTECTOMY  07/2017  . JOINT REPLACEMENT Left    left knee  . [place] RESECTION  2018  . LUMBAR EPIDURAL INJECTION  04/13/2018  . REVISION TOTAL KNEE ARTHROPLASTY Left 12/2017  . TOTAL SHOULDER ARTHROPLASTY Bilateral    Social History   Social History Narrative  . Not on file   Immunization History  Administered Date(s) Administered  . Influenza,inj,Quad PF,6+ Mos 04/15/2018  . Pneumococcal Polysaccharide-23 04/15/2018  . Zoster Recombinat (Shingrix) 05/13/2018     Objective: Vital Signs: There were no vitals taken for this visit.   Physical Exam   Musculoskeletal Exam: ***  CDAI Exam: CDAI Score: - Patient Global: -; Provider Global: - Swollen: -; Tender: - Joint Exam   No joint exam has been documented for this visit   There is currently no information documented on the homunculus. Go to the Rheumatology activity and complete the homunculus joint exam.  Investigation: No additional findings.  Imaging: No results found.  Recent Labs: Lab Results  Component Value Date   WBC 4.4 09/16/2018   HGB 12.9 09/16/2018   PLT 337 09/16/2018   NA 143 09/16/2018   K 4.5 09/16/2018   CL 107 09/16/2018   CO2 28 09/16/2018   GLUCOSE 105 (H) 09/16/2018   BUN 18 09/16/2018   CREATININE 0.66  09/16/2018   BILITOT 0.4 09/16/2018   AST 21 10/17/2018   ALT 27 10/17/2018   PROT 6.6 09/16/2018   CALCIUM 9.4 09/16/2018   GFRAA 114 09/16/2018   QFTBGOLDPLUS NEGATIVE 06/27/2018    Speciality Comments: No specialty comments available.  Procedures:  No procedures performed Allergies: Aspirin   Assessment / Plan:     Visit Diagnoses: No diagnosis found.  Orders: No orders of the defined types were placed in this encounter.   No orders of the defined types were placed in this encounter.   Face-to-face time spent with patient was *** minutes. Greater than 50% of time was spent in counseling and coordination of care.  Follow-Up Instructions: No follow-ups on file.   Earnestine Mealing, CMA  Note - This record has been created using Editor, commissioning.  Chart creation errors have been sought, but may not always  have been located. Such creation errors do not reflect on  the standard of medical care.

## 2019-01-16 ENCOUNTER — Telehealth: Payer: Self-pay | Admitting: Rheumatology

## 2019-01-16 DIAGNOSIS — L409 Psoriasis, unspecified: Secondary | ICD-10-CM

## 2019-01-16 DIAGNOSIS — L4050 Arthropathic psoriasis, unspecified: Secondary | ICD-10-CM

## 2019-01-16 MED ORDER — COSENTYX SENSOREADY (300 MG) 150 MG/ML ~~LOC~~ SOAJ: 300.0000 mg | SUBCUTANEOUS | 0 refills | Status: DC

## 2019-01-16 NOTE — Telephone Encounter (Signed)
Last Visit: 12/30/18 Next Visit: 01/27/19  Labs: 09/16/18 Elevated LFTs.  TB Gold: 06/27/18 Neg   Patient advised she is due for labs. Patient states she will be in to update 01/17/19.  Okay to refill 30 day supply per Dr. Estanislado Pandy

## 2019-01-16 NOTE — Telephone Encounter (Signed)
Patient called requesting prescription refill of Cosentyx to be sent to [facility].

## 2019-01-17 ENCOUNTER — Other Ambulatory Visit: Payer: Self-pay

## 2019-01-17 DIAGNOSIS — Z79899 Other long term (current) drug therapy: Secondary | ICD-10-CM

## 2019-01-18 ENCOUNTER — Telehealth: Payer: Self-pay | Admitting: *Deleted

## 2019-01-18 DIAGNOSIS — R7989 Other specified abnormal findings of blood chemistry: Secondary | ICD-10-CM

## 2019-01-18 LAB — CBC WITH DIFFERENTIAL/PLATELET
Absolute Monocytes: 535 cells/uL (ref 200–950)
Basophils Absolute: 38 cells/uL (ref 0–200)
Basophils Relative: 0.7 %
Eosinophils Absolute: 70 cells/uL (ref 15–500)
Eosinophils Relative: 1.3 %
HCT: 37.2 % (ref 35.0–45.0)
Hemoglobin: 12.5 g/dL (ref 11.7–15.5)
Lymphs Abs: 1312 cells/uL (ref 850–3900)
MCH: 30.7 pg (ref 27.0–33.0)
MCHC: 33.6 g/dL (ref 32.0–36.0)
MCV: 91.4 fL (ref 80.0–100.0)
MPV: 10.5 fL (ref 7.5–12.5)
Monocytes Relative: 9.9 %
Neutro Abs: 3445 cells/uL (ref 1500–7800)
Neutrophils Relative %: 63.8 %
Platelets: 414 10*3/uL — ABNORMAL HIGH (ref 140–400)
RBC: 4.07 10*6/uL (ref 3.80–5.10)
RDW: 12.9 % (ref 11.0–15.0)
Total Lymphocyte: 24.3 %
WBC: 5.4 10*3/uL (ref 3.8–10.8)

## 2019-01-18 LAB — COMPLETE METABOLIC PANEL WITH GFR
AG Ratio: 1.4 (calc) (ref 1.0–2.5)
ALT: 49 U/L — ABNORMAL HIGH (ref 6–29)
AST: 28 U/L (ref 10–35)
Albumin: 3.9 g/dL (ref 3.6–5.1)
Alkaline phosphatase (APISO): 98 U/L (ref 37–153)
BUN: 13 mg/dL (ref 7–25)
CO2: 26 mmol/L (ref 20–32)
Calcium: 9.3 mg/dL (ref 8.6–10.4)
Chloride: 106 mmol/L (ref 98–110)
Creat: 0.73 mg/dL (ref 0.50–1.05)
GFR, Est African American: 107 mL/min/{1.73_m2} (ref >=60)
GFR, Est Non African American: 92 mL/min/{1.73_m2} (ref >=60)
Globulin: 2.7 g/dL (calc) (ref 1.9–3.7)
Glucose, Bld: 127 mg/dL — ABNORMAL HIGH (ref 65–99)
Potassium: 4.6 mmol/L (ref 3.5–5.3)
Sodium: 144 mmol/L (ref 135–146)
Total Bilirubin: 0.3 mg/dL (ref 0.2–1.2)
Total Protein: 6.6 g/dL (ref 6.1–8.1)

## 2019-01-18 NOTE — Telephone Encounter (Signed)
-----   Message from Ofilia Neas, PA-C sent at 01/18/2019 10:32 AM EDT ----- Glucose is elevated-127.  ALT is elevated-49.  Please advise patient to reduce MTX to 5 tablets by mouth once weekly.  Recheck LFTs in 1 month.   plts are mildly elevated.  We will continue to monitor.  Rest of CBC WNL

## 2019-01-19 ENCOUNTER — Emergency Department (HOSPITAL_COMMUNITY): Payer: MEDICARE

## 2019-01-19 ENCOUNTER — Emergency Department (HOSPITAL_COMMUNITY)
Admission: EM | Admit: 2019-01-19 | Discharge: 2019-01-19 | Discharge status: Against Medical Advice | Payer: MEDICARE | Attending: Emergency Medicine | Admitting: Emergency Medicine

## 2019-01-19 DIAGNOSIS — R0789 Other chest pain: Secondary | ICD-10-CM | POA: Diagnosis present

## 2019-01-19 DIAGNOSIS — Z5321 Procedure and treatment not carried out due to patient leaving prior to being seen by health care provider: Secondary | ICD-10-CM | POA: Diagnosis not present

## 2019-01-19 LAB — CBC
HCT: 40.4 % (ref 36.0–46.0)
Hemoglobin: 13.2 g/dL (ref 12.0–15.0)
MCH: 31.4 pg (ref 26.0–34.0)
MCHC: 32.7 g/dL (ref 30.0–36.0)
MCV: 96 fL (ref 80.0–100.0)
Platelets: 385 10*3/uL (ref 150–400)
RBC: 4.21 MIL/uL (ref 3.87–5.11)
RDW: 12.8 % (ref 11.5–15.5)
WBC: 6.6 10*3/uL (ref 4.0–10.5)
nRBC: 0 % (ref 0.0–0.2)

## 2019-01-19 LAB — BASIC METABOLIC PANEL
Anion gap: 10 (ref 5–15)
BUN: 15 mg/dL (ref 6–20)
CO2: 27 mmol/L (ref 22–32)
Calcium: 9.3 mg/dL (ref 8.9–10.3)
Chloride: 105 mmol/L (ref 98–111)
Creatinine, Ser: 0.8 mg/dL (ref 0.44–1.00)
GFR calc Af Amer: >60 mL/min (ref >60)
GFR calc non Af Amer: >60 mL/min (ref >60)
Glucose, Bld: 96 mg/dL (ref 70–99)
Potassium: 5.2 mmol/L — ABNORMAL HIGH (ref 3.5–5.1)
Sodium: 142 mmol/L (ref 135–145)

## 2019-01-19 LAB — I-STAT BETA HCG BLOOD, ED (MC, WL, AP ONLY): I-stat hCG, quantitative: <5 m[IU]/mL (ref <5)

## 2019-01-19 LAB — TROPONIN I (HIGH SENSITIVITY): Troponin I (High Sensitivity): 5 ng/L (ref <18)

## 2019-01-19 IMAGING — CR DG CHEST 2V
2 series · 2 of 2 positions shown · non-contrast
Comparison: [DATE]

CLINICAL DATA: Chest pain

EXAM:
CHEST - 2 VIEW

[chest pa]
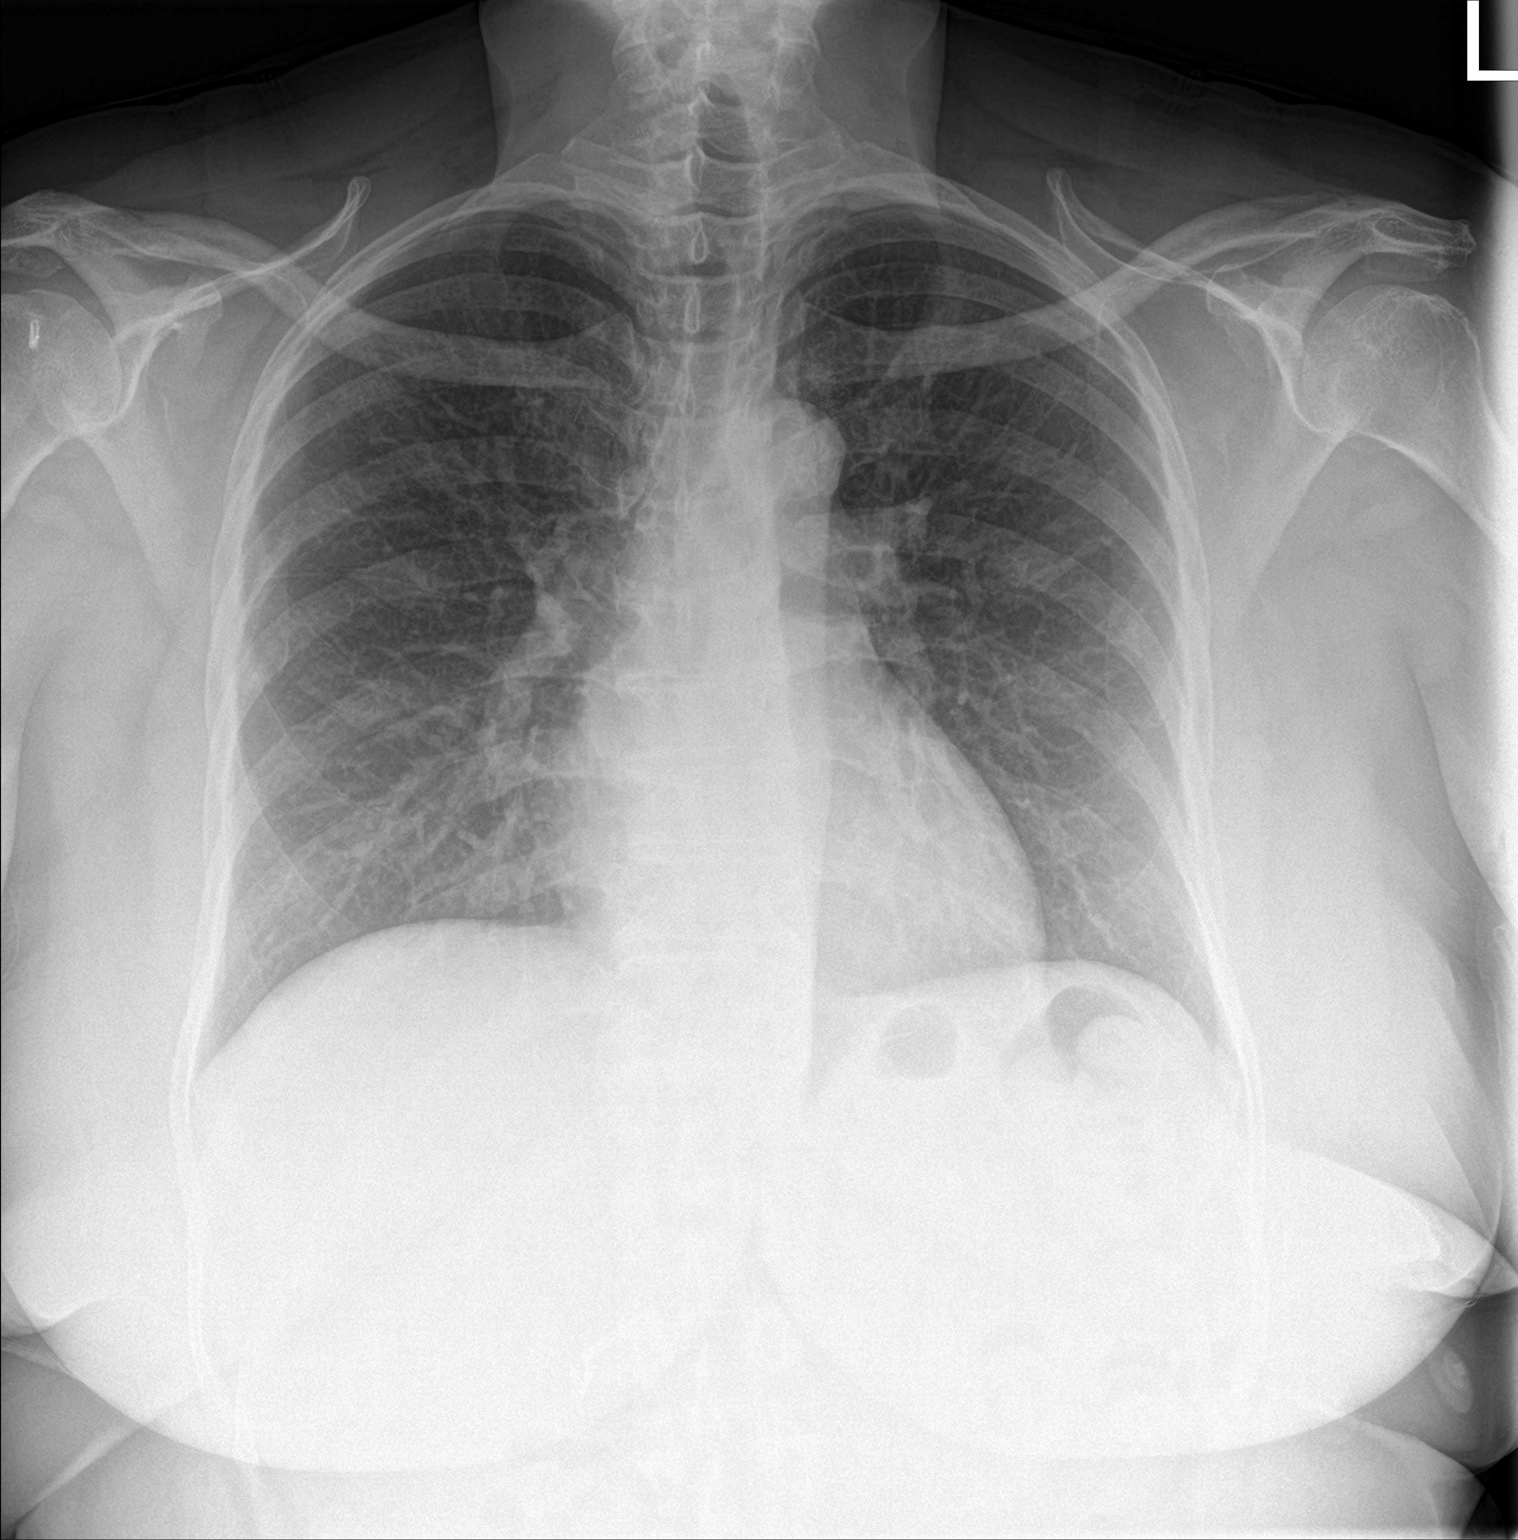

[chest lat]
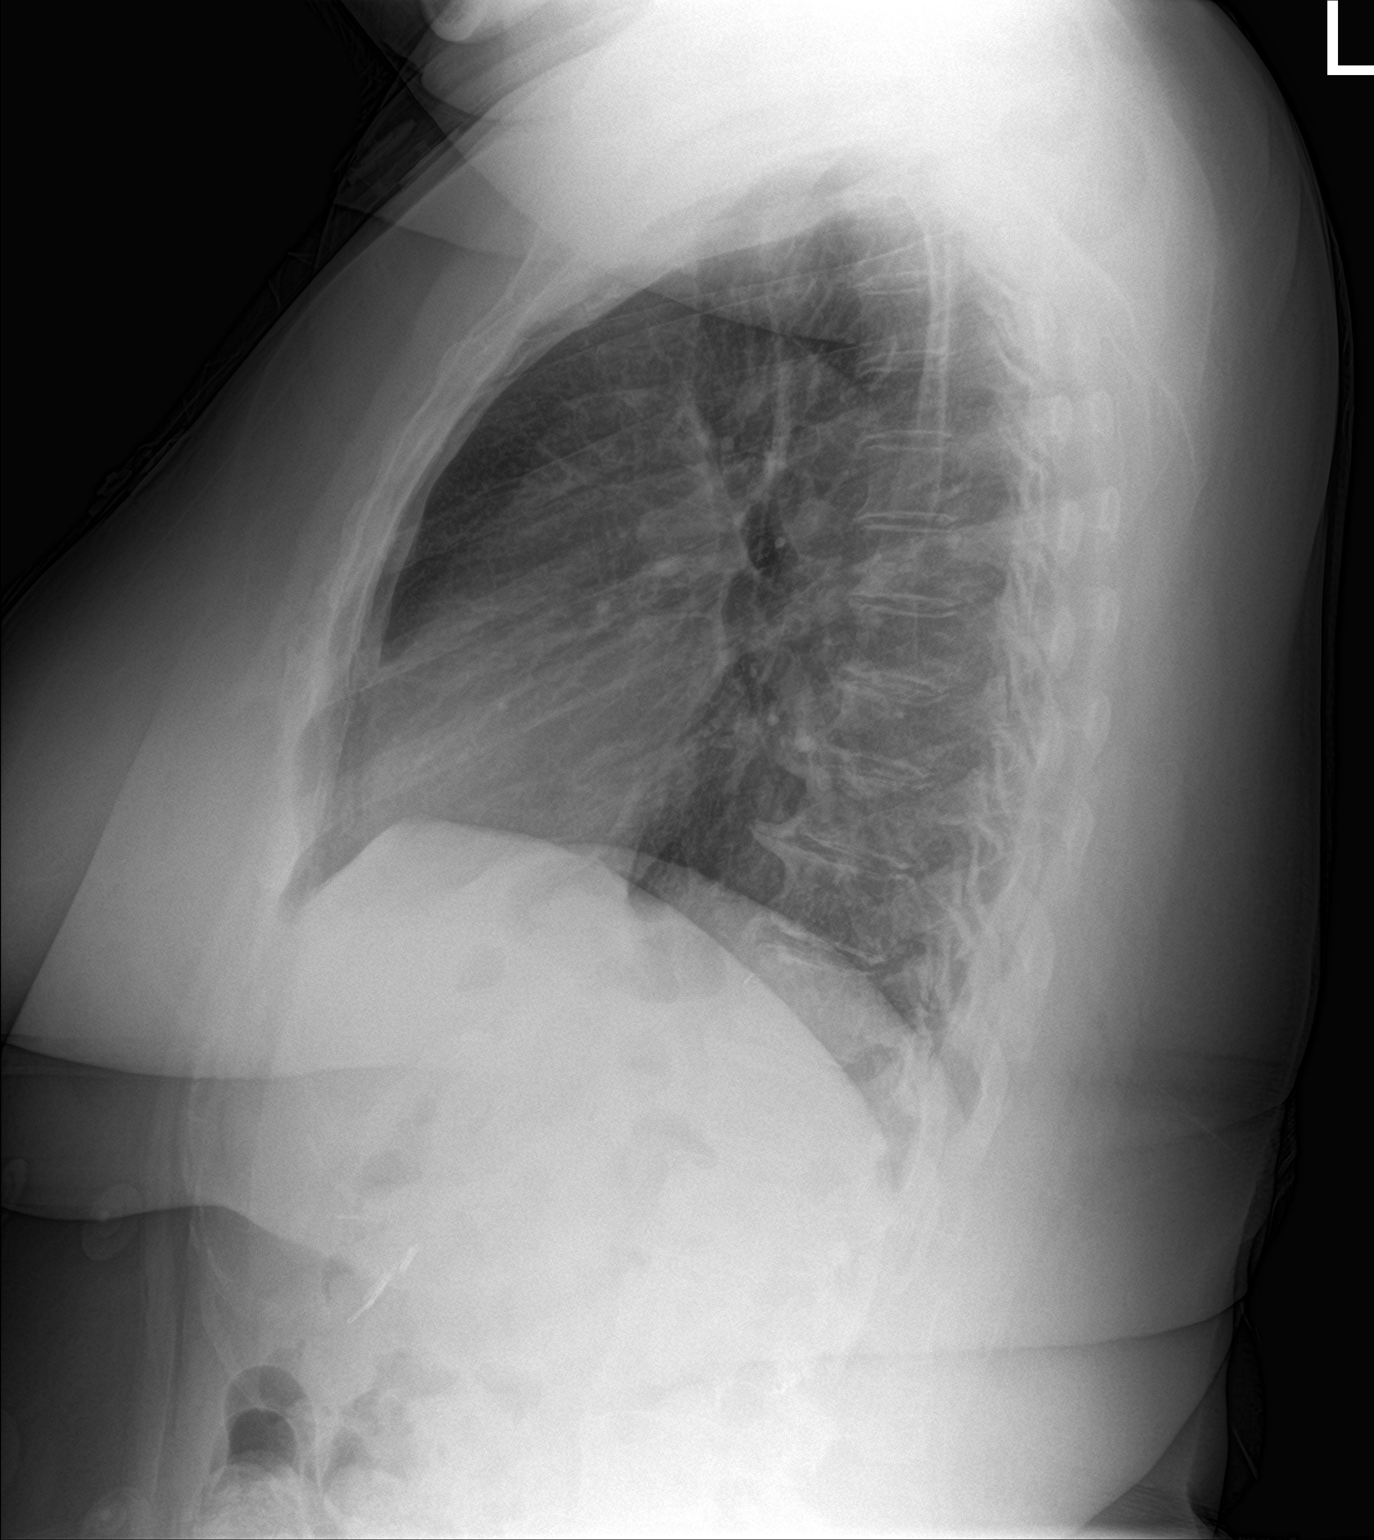

[2 of 2 positions shown; findings below may reference images not displayed]

FINDINGS: The heart size and mediastinal contours are within normal limits.
Both lungs are clear. Disc degenerative disease of the thoracic
spine.
IMPRESSION: No acute abnormality of the lungs.

## 2019-01-19 MED ORDER — SODIUM CHLORIDE 0.9% FLUSH: 3.0000 mL | Freq: Once | INTRAVENOUS | Status: DC

## 2019-01-19 NOTE — ED Triage Notes (Signed)
Pt states she was just sitting down at home when suddenly she began to have pain in the center of her chest that never went away. Pt states she thought this was heartburn but then woke up feeling dizziness and lightheaded.

## 2019-01-19 NOTE — ED Notes (Signed)
Patient stated that she had kids at home that she could not wait any longer so she left

## 2019-01-20 ENCOUNTER — Other Ambulatory Visit: Payer: Self-pay

## 2019-01-20 ENCOUNTER — Emergency Department (HOSPITAL_COMMUNITY)
Admission: EM | Admit: 2019-01-20 | Discharge: 2019-01-20 | Disposition: A | Discharge status: Home / Self Care | Payer: Medicare Other | Attending: Emergency Medicine | Admitting: Emergency Medicine

## 2019-01-20 DIAGNOSIS — Z79899 Other long term (current) drug therapy: Secondary | ICD-10-CM | POA: Insufficient documentation

## 2019-01-20 DIAGNOSIS — R519 Headache, unspecified: Secondary | ICD-10-CM

## 2019-01-20 DIAGNOSIS — Z9884 Bariatric surgery status: Secondary | ICD-10-CM | POA: Insufficient documentation

## 2019-01-20 DIAGNOSIS — R51 Headache: Secondary | ICD-10-CM | POA: Insufficient documentation

## 2019-01-20 MED ORDER — DEXAMETHASONE 4 MG PO TABS
10.0000 mg | ORAL_TABLET | Freq: Once | ORAL | Status: AC
  Administered 2019-01-20: 10 mg via ORAL
  Filled 2019-01-20: qty 3

## 2019-01-20 MED ORDER — DIPHENHYDRAMINE HCL 25 MG PO CAPS
25.0000 mg | ORAL_CAPSULE | Freq: Once | ORAL | Status: AC
  Administered 2019-01-20: 25 mg via ORAL
  Filled 2019-01-20: qty 1

## 2019-01-20 MED ORDER — SODIUM CHLORIDE 0.9% FLUSH: 3.0000 mL | Freq: Once | INTRAVENOUS | Status: DC

## 2019-01-20 MED ORDER — PROCHLORPERAZINE MALEATE 5 MG PO TABS
10.0000 mg | ORAL_TABLET | Freq: Once | ORAL | Status: AC
  Administered 2019-01-20: 10 mg via ORAL
  Filled 2019-01-20: qty 2

## 2019-01-20 NOTE — ED Triage Notes (Signed)
Pt c.o headache that started this morning along with dizziness and chest discomfort that started yesterday. Pt was triaged yesterday but left after waiting a while. Pt States her dizziness and chest discomfort has improved but now she having a headache. Pt a.o, ambulatory.

## 2019-01-20 NOTE — ED Provider Notes (Signed)
[facility] EMERGENCY DEPARTMENT Provider Note   CSN: [id number] Arrival date & time: 01/20/19  1145     History   Chief Complaint Chief Complaint  Patient presents with   Headache    HPI Connie Hoffman is a 58 y.o. female.     The history is provided by the patient.  Headache Pain location:  Occipital Quality:  Dull Severity currently:  4/10 Severity at highest:  4/10 Onset quality:  Gradual Timing:  Constant Progression:  Unchanged Chronicity:  Recurrent Similar to prior headaches: yes   Context: not activity   Relieved by:  Nothing Worsened by:  Nothing Associated symptoms: no abdominal pain, no back pain, no blurred vision, no congestion, no cough, no diarrhea, no dizziness, no drainage, no ear pain, no eye pain, no fatigue, no fever, no nausea, no neck pain, no neck stiffness, no numbness, no seizures, no sore throat, no vomiting and no weakness     Past Medical History:  Diagnosis Date   Psoriatic arthritis ([place])     Patient Active Problem List   Diagnosis Date Noted   DDD (degenerative disc disease), lumbar 01/21/2018   Primary osteoarthritis of both hands 01/21/2018   Primary osteoarthritis of both feet 01/21/2018   Psoriasis 12/01/2017   Psoriatic arthritis ([place]) 12/01/2017   High risk medication use 12/01/2017   S/P total knee replacement, 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   Thrombocytosis ([place]) 12/01/2017   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    Past Surgical History:  Procedure Laterality Date   ABDOMINAL HYSTERECTOMY     CHOLECYSTECTOMY     CYSTECTOMY  07/2017   JOINT REPLACEMENT Left    left knee   LAPAROSCOPIC GASTRIC SLEEVE RESECTION  2018   LUMBAR EPIDURAL INJECTION  04/13/2018   REVISION TOTAL KNEE ARTHROPLASTY Left 12/2017   TOTAL SHOULDER ARTHROPLASTY Bilateral      OB History   No obstetric history on file.      Home Medications    Prior to Admission medications   Medication Sig Start Date End  Date Taking? Authorizing Provider  acetaminophen (TYLENOL) 650 MG CR tablet Take 650 mg by mouth every 8 (eight) hours as needed (for pain or headaches).     [provider]  folic acid (FOLVITE) 1 MG tablet Take 1 mg by mouth daily.    [provider]  Menthol, Topical Analgesic, (BIOFREEZE) 4 % GEL Apply 1 application topically See admin instructions. Apply to painful sites as directed    [provider]  methocarbamol (ROBAXIN) 500 MG tablet TAKE 1 TABLET BY MOUTH 4 TIMES A DAY AS NEEDED 12/30/17   [provider]  methotrexate (RHEUMATREX) 2.5 MG tablet Take 6 tablets by mouth once weekly. Caution:Chemotherapy. Protect from light. 12/30/18   Ofilia Neas, PA-C  Multiple Vitamin (MULTIVITAMIN WITH MINERALS) TABS tablet Take 1 tablet by mouth daily.    [provider]  Secukinumab, 300 MG Dose, (COSENTYX SENSOREADY, 300 MG,) 150 MG/ML SOAJ Inject 300 mg into the skin every 28 (twenty-eight) days. 01/16/19   Bo Merino, MD  triamcinolone ointment (KENALOG) 0.1 % Apply 1 application topically 2 (two) times daily as needed (for itching). Prn  10/05/17   [provider]    Family History Family History  Problem Relation Age of Onset   Diabetes Mother    Hypertension Mother    Heart Problems Mother    Congestive Heart Failure Mother    Diabetes Father    Hypertension Father    Stroke  Sister    Heart Problems Sister    Prostate cancer Brother    Diabetes Brother    Asthma Daughter    Healthy Daughter    Healthy Son    Healthy Son     Social History Social History   Tobacco Use   Smoking status: Never Smoker   Smokeless tobacco: Never Used  Substance Use Topics   Alcohol use: Never    Frequency: Never   Drug use: Never     Allergies   Aspirin   Review of Systems Review of Systems  Constitutional: Negative for chills, fatigue and fever.  HENT: Negative for congestion, ear pain, postnasal drip and  sore throat.   Eyes: Negative for blurred vision, pain and visual disturbance.  Respiratory: Negative for cough and shortness of breath.   Cardiovascular: Negative for chest pain and palpitations.  Gastrointestinal: Negative for abdominal pain, diarrhea, nausea and vomiting.  Genitourinary: Negative for dysuria and hematuria.  Musculoskeletal: Negative for arthralgias, back pain, neck pain and neck stiffness.  Skin: Negative for color change and rash.  Neurological: Positive for headaches. Negative for dizziness, tremors, seizures, syncope, facial asymmetry, speech difficulty, weakness, light-headedness and numbness.  All other systems reviewed and are negative.    Physical Exam Updated Vital Signs  ED Triage Vitals  Enc Vitals Group     BP 01/20/19 1217 122/64     Pulse Rate 01/20/19 1217 68     Resp 01/20/19 1217 16     Temp 01/20/19 1217 97.8 F (36.6 C)     Temp Source 01/20/19 1217 Oral     SpO2 01/20/19 1217 100 %     Weight --      Height --      Head Circumference --      Peak Flow --      Pain Score 01/20/19 1231 5     Pain Loc --      Pain Edu? --      Excl. in [place]? --     Physical Exam Vitals signs and nursing note reviewed.  Constitutional:      General: She is not in acute distress.    Appearance: She is well-developed.  HENT:     Head: Normocephalic and atraumatic.  Eyes:     General: No visual field deficit.    Extraocular Movements: Extraocular movements intact.     Right eye: Normal extraocular motion and no nystagmus.     Left eye: Normal extraocular motion and no nystagmus.     Conjunctiva/sclera: Conjunctivae normal.     Pupils: Pupils are equal, round, and reactive to light.     Right eye: Pupil is reactive.     Left eye: Pupil is reactive.  Neck:     Musculoskeletal: Normal range of motion and neck supple. No neck rigidity.  Cardiovascular:     Rate and Rhythm: Normal rate and regular rhythm.     Heart sounds: No murmur.  Pulmonary:      Effort: Pulmonary effort is normal. No respiratory distress.     Breath sounds: Normal breath sounds.  Abdominal:     Palpations: Abdomen is soft.     Tenderness: There is no abdominal tenderness.  Skin:    General: Skin is warm and dry.     Capillary Refill: Capillary refill takes less than 2 seconds.  Neurological:     Mental Status: She is alert and oriented to person, place, and time.     Cranial Nerves: No  cranial nerve deficit, dysarthria or facial asymmetry.     Sensory: No sensory deficit.     Motor: No weakness.     Coordination: Coordination normal.  Psychiatric:        Mood and Affect: Mood normal.      ED Treatments / Results  Labs (all labs ordered are listed, but only abnormal results are displayed) Labs Reviewed - No data to display  EKG EKG Interpretation  Date/Time:  Friday January 20 2019 11:50:42 EDT Ventricular Rate:  67 PR Interval:  156 QRS Duration: 90 QT Interval:  372 QTC Calculation: 393 R Axis:   30 Text Interpretation:  Normal sinus rhythm Normal ECG Confirmed by Lennice Sites [phone]) on 01/20/2019 12:21:43 PM   Radiology Dg Chest 2 View  Result Date: 01/19/2019 CLINICAL DATA:  Chest pain EXAM: CHEST - 2 VIEW COMPARISON:  10/16/2017 FINDINGS: The heart size and mediastinal contours are within normal limits. Both lungs are clear. Disc degenerative disease of the thoracic spine. IMPRESSION: No acute abnormality of the lungs. Electronically Signed   By: Eddie Candle M.D.   On: 01/19/2019 13:14    Procedures Procedures (including critical care time)  Medications Ordered in ED Medications  sodium chloride flush (NS) 0.9 % injection 3 mL (3 mLs Intravenous Not Given 01/20/19 1250)  diphenhydrAMINE (BENADRYL) capsule 25 mg (25 mg Oral Given 01/20/19 1247)  prochlorperazine (COMPAZINE) tablet 10 mg (10 mg Oral Given 01/20/19 1247)  dexamethasone (DECADRON) tablet 10 mg (10 mg Oral Given 01/20/19 1246)     Initial Impression / Assessment and  Plan / ED Course  I have reviewed the triage vital signs and the nursing notes.  Pertinent labs & imaging results that were available during my care of the patient were reviewed by me and considered in my medical decision making (see chart for details).     Connie Hoffman is a 58 year old female with history of psoriatic arthritis who presents to the ED with headache.  Patient with normal vitals.  No fever.  Patient came to the emergency department yesterday for burning sensation in her chest.  Felt like acid reflux.  Left without being seen.  Had lab work that was unremarkable.  Normal troponin.  No significant anemia, electrolyte abnormality, kidney injury.  No shortness of breath.  No risk factors for PE.  Currently does not have any shortness of breath or chest pain.  Reflux symptoms have resolved.  Woke up this morning with a mild headache which she has at times but she states that is difficult for her to treat her headaches as she does not take Tylenol Motrin due to being on methotrexate.  Patient neurologically intact.  No concern for stroke.  No concern for infectious process.  Repeat EKG shows sinus rhythm.  No ischemic changes.  No cardiac risk factors except for hypertension.  Overall patient likely with mild tension headache.  Likely has some reflux.  Will start Protonix.  Given headache cocktail here in the ED.  Recommend follow-up with primary care doctor and discharged from ED in good condition.  This chart was dictated using voice recognition software.  Despite best efforts to proofread,  errors can occur which can change the documentation meaning.    Final Clinical Impressions(s) / ED Diagnoses   Final diagnoses:  Nonintractable headache, unspecified chronicity pattern, unspecified headache type    ED Discharge Orders    None       Lennice Sites, DO 01/20/19 1412

## 2019-01-27 ENCOUNTER — Ambulatory Visit: Payer: 59 | Admitting: Rheumatology

## 2019-01-31 ENCOUNTER — Telehealth: Payer: Self-pay | Admitting: Rheumatology

## 2019-01-31 NOTE — Telephone Encounter (Signed)
Attempted to contact patient and left message for patient to call the office.  

## 2019-01-31 NOTE — Telephone Encounter (Signed)
Patient states she has had a new cough type symptom for two weeks now. No fever, or chills, no aches other than normal aches. Patient does not know whom to see for this. Please call to advise.

## 2019-02-09 ENCOUNTER — Other Ambulatory Visit: Payer: Self-pay

## 2019-02-09 DIAGNOSIS — R7989 Other specified abnormal findings of blood chemistry: Secondary | ICD-10-CM

## 2019-02-10 ENCOUNTER — Other Ambulatory Visit: Payer: Self-pay | Admitting: *Deleted

## 2019-02-10 DIAGNOSIS — L4050 Arthropathic psoriasis, unspecified: Secondary | ICD-10-CM

## 2019-02-10 DIAGNOSIS — L409 Psoriasis, unspecified: Secondary | ICD-10-CM

## 2019-02-10 LAB — AST: AST: 13 U/L (ref 10–35)

## 2019-02-10 LAB — ALT: ALT: 20 U/L (ref 6–29)

## 2019-02-10 MED ORDER — COSENTYX SENSOREADY (300 MG) 150 MG/ML ~~LOC~~ SOAJ: 300.0000 mg | SUBCUTANEOUS | 0 refills | Status: DC

## 2019-02-10 NOTE — Telephone Encounter (Signed)
Patient states she needs a refill on Cosentyx.   Last Visit: 12/30/18  Next Visit: 02/28/19 Labs: 01/17/19 Glucose is elevated-127. ALT is elevated-49 LFTs WNL on 02/10/19. TB Gold: 06/27/18 Neg   Okay to refill per Dr. Estanislado Pandy

## 2019-02-14 NOTE — Progress Notes (Signed)
Office Visit Note  Patient: Connie Hoffman             Date of Birth: August 19, 1960           MRN: [id number]             PCP: Jenel Lucks, PA-C Referring: Burna Cash* Visit Date: 02/28/2019 Occupation: @GUAROCC @  Subjective:  Pain in both hands   History of Present Illness: Connie Hoffman is a 58 y.o. female with history of psoriatic arthritis, osteoarthritis, and DDD.  She is on Cosentyx 300 mg sq injections every 28 days, MTX 6 tablets po once weekly, and folic acid 2 mg po daily. She reports she is having severe pain in both hands and both feet.  She states she has had intermittent swelling in both hands over the past 3 days.  She has difficulty opening jars and performing some of her daily activities. She is wearing arthritis gloves, which provide minimal relief. She has chronic lower back pain, and she was recently referred by Dr. Wyona Almas to discuss surgery since she has had an inadequate response to steroid injections in the past.  Activities of Daily Living:  Patient reports morning stiffness for 45 minutes.   Patient Reports nocturnal pain.  Difficulty dressing/grooming: Denies Difficulty climbing stairs: Reports Difficulty getting out of chair: Reports Difficulty using hands for taps, buttons, cutlery, and/or writing: Reports  Review of Systems  Constitutional: Positive for fatigue.  HENT: Negative for mouth sores, mouth dryness and nose dryness.   Eyes: Positive for dryness. Negative for pain and visual disturbance.  Respiratory: Negative for cough, hemoptysis, shortness of breath and difficulty breathing.   Cardiovascular: Negative for chest pain, palpitations, hypertension and swelling in legs/feet.  Gastrointestinal: Negative for blood in stool, constipation and diarrhea.  Endocrine: Negative for increased urination.  Genitourinary: Negative for difficulty urinating and painful urination.  Musculoskeletal: Positive for arthralgias,  joint pain, joint swelling and morning stiffness. Negative for myalgias, muscle weakness, muscle tenderness and myalgias.  Skin: Positive for rash. Negative for color change, pallor, hair loss, nodules/bumps, skin tightness, ulcers and sensitivity to sunlight.  Allergic/Immunologic: Negative for susceptible to infections.  Neurological: Negative for dizziness, headaches and weakness.  Hematological: Negative for swollen glands.  Psychiatric/Behavioral: Positive for sleep disturbance. Negative for depressed mood. The patient is not nervous/anxious.     PMFS History:  Patient Active Problem List   Diagnosis Date Noted   DDD (degenerative disc disease), lumbar 01/21/2018   Primary osteoarthritis of both hands 01/21/2018   Primary osteoarthritis of both feet 01/21/2018   Psoriasis 12/01/2017   Psoriatic arthritis ([place]) 12/01/2017   High risk medication use 12/01/2017   S/P total knee replacement, 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   Thrombocytosis ([place]) 12/01/2017   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    Past Medical History:  Diagnosis Date   DDD (degenerative disc disease), lumbar    Psoriatic arthritis ([place])    Spinal stenosis     Family History  Problem Relation Age of Onset   Diabetes Mother    Hypertension Mother    Heart Problems Mother    Congestive Heart Failure Mother    Diabetes Father    Hypertension Father    Stroke Sister    Heart Problems Sister    Prostate cancer Brother    Diabetes Brother    Asthma Daughter    Healthy Daughter    Healthy Son    Healthy Son    Past Surgical History:  Procedure Laterality Date  ABDOMINAL HYSTERECTOMY     CHOLECYSTECTOMY     CYSTECTOMY  07/2017   JOINT REPLACEMENT Left    left knee   LAPAROSCOPIC GASTRIC SLEEVE RESECTION  2018   LUMBAR EPIDURAL INJECTION  04/13/2018   REVISION TOTAL KNEE ARTHROPLASTY Left 12/2017   TOTAL SHOULDER ARTHROPLASTY Bilateral    Social History   Social History Narrative     Not on file   Immunization History  Administered Date(s) Administered   Influenza,inj,Quad PF,6+ Mos 04/15/2018   Pneumococcal Polysaccharide-23 04/15/2018   Zoster Recombinat (Shingrix) 05/13/2018     Objective: Vital Signs: BP 124/68 (BP Location: Left Arm, Patient Position: Sitting, Cuff Size: Normal)    Pulse 69    Resp 18    Ht 5\' 5"  (1.651 m)    Wt 250 lb 9.6 oz (113.7 kg)    BMI 41.70 kg/m    Physical Exam Vitals signs and nursing note reviewed.  Constitutional:      Appearance: She is well-developed.  HENT:     Head: Normocephalic and atraumatic.  Eyes:     Conjunctiva/sclera: Conjunctivae normal.  Neck:     Musculoskeletal: Normal range of motion.  Cardiovascular:     Rate and Rhythm: Normal rate and regular rhythm.     Heart sounds: Normal heart sounds.  Pulmonary:     Effort: Pulmonary effort is normal.     Breath sounds: Normal breath sounds.  Abdominal:     General: Bowel sounds are normal.     Palpations: Abdomen is soft.  Lymphadenopathy:     Cervical: No cervical adenopathy.  Skin:    General: Skin is warm and dry.     Capillary Refill: Capillary refill takes less than 2 seconds.  Neurological:     Mental Status: She is alert and oriented to person, place, and time.  Psychiatric:        Behavior: Behavior normal.      Musculoskeletal Exam: C-spine good ROM. Limited and painful ROM of lumbar spine. Bilateral shoulder abduction to about 90 degrees.  Elbow joints good ROM with no tenderness or inflammation.  Wrist joints, MCPs, PIPs, and DIPs good ROM with no synovitis. PIP and DIP synovial thickening consistent with osteoarthritis of both hands. Hip joints good ROM with no discomfort.  Right knee replacement warmth and limited flexion to about 40 degrees.  Left knee has good ROM with no warmth or effusion.  Ankle joints, MTPs, PIPs, and DIPs good ROM with no synovitis.  No tenderness or swelling of ankle joints.    CDAI Exam: CDAI Score: -- Patient  Global: --; Provider Global: -- Swollen: --; Tender: -- Joint Exam   No joint exam has been documented for this visit   There is currently no information documented on the homunculus. Go to the Rheumatology activity and complete the homunculus joint exam.  Investigation: No additional findings.  Imaging: No results found.  Recent Labs: Lab Results  Component Value Date   WBC 6.6 01/19/2019   HGB 13.2 01/19/2019   PLT 385 01/19/2019   NA 142 01/19/2019   K 5.2 (H) 01/19/2019   CL 105 01/19/2019   CO2 27 01/19/2019   GLUCOSE 96 01/19/2019   BUN 15 01/19/2019   CREATININE 0.80 01/19/2019   BILITOT 0.3 01/17/2019   AST 13 02/09/2019   ALT 20 02/09/2019   PROT 6.6 01/17/2019   CALCIUM 9.3 01/19/2019   GFRAA >60 01/19/2019   QFTBGOLDPLUS NEGATIVE 06/27/2018    Speciality Comments: No specialty comments  available.  Procedures:  No procedures performed Allergies: Aspirin     Assessment / Plan:     Visit Diagnoses: Psoriatic arthritis ([place]): She has no synovitis or dactylitis on exam.  She is clinically doing well on Cosentyx 300 mg sq injections every 28 days, MTX 6 tablets by mouth once weekly, and folic acid 2 mg po daily. She has chronic pain in both hands and both feet but no inflammation was noted on exam.  She was encouraged to wear arthritis gloves as needed.  Joint protection and muscle strengthening were discussed. She has no achilles tendonitis or plantar fasciitis.  No SI joint tenderness on exam.  She will continue on the current treatment regimen.  She does not need a refill at this time.  She was advised to notify us if she develops increased joint pain or joint swelling.  She will follow up in 5 months.   Psoriasis: She has no psoriasis at this time. She uses triamcinolone ointment topically as needed.   High risk medication use - Cosentyx 300 mg every 28 days, methotrexate 6 tablets every 7 days, and folic acid 2 mg daily.  Last TB gold negative on 06/27/2018  and will monitor yearly.  Most recent CBC/CMP within normal limits on 01/19/2019 and will monitor every 3 months.  Standing orders are in place.   S/P total knee replacement, left - Left knee joint revision December 06, 2017, September 2019 manipulation: She has limited flexion to about 40 degrees with discomfort.  Warmth but no effusion noted.   DDD (degenerative disc disease), lumbar - with spinal stenosis: Chronic pain.  She has limited and painful ROM.  She was recently referred to a neurosurgeon to discuss surgery.   Primary osteoarthritis of both hands: She has PIP and DIP synovial thickening.  No synovitis noted. Complete fist formation bilaterally.  She has been having difficulty opening jars.  She wears arthritis gloves, which provide some pain relief. Joint protection and muscle strengthening were discussed.    Primary osteoarthritis of both feet: She has pain in both feet.  She has no joint swelling.  She wears proper fitting shoes.   Other medical conditions are listed as follows:   Thrombocytosis ([place])  History of gastric bypass  Orders: No orders of the defined types were placed in this encounter.  No orders of the defined types were placed in this encounter.     Follow-Up Instructions: Return in about 5 months (around 07/29/2019) for Psoriatic arthritis, Osteoarthritis, DDD.   Ofilia Neas, PA-C   I examined and evaluated the patient with Hazel Sams PA.  Patient had no active synovitis on examination.  We will continue current treatment.  The plan of care was discussed as noted above.  Bo Merino, MD  Note - This record has been created using Editor, commissioning.  Chart creation errors have been sought, but may not always  have been located. Such creation errors do not reflect on  the standard of medical care.

## 2019-02-28 ENCOUNTER — Encounter: Payer: Self-pay | Admitting: Physician Assistant

## 2019-02-28 ENCOUNTER — Ambulatory Visit (INDEPENDENT_AMBULATORY_CARE_PROVIDER_SITE_OTHER): Payer: Medicare Other | Admitting: Rheumatology

## 2019-02-28 ENCOUNTER — Other Ambulatory Visit: Payer: Self-pay

## 2019-02-28 VITALS — BP 124/68 | HR 69 | Resp 18 | Ht 65.0 in | Wt 250.6 lb

## 2019-02-28 DIAGNOSIS — Z96652 Presence of left artificial knee joint: Secondary | ICD-10-CM | POA: Diagnosis not present

## 2019-02-28 DIAGNOSIS — D75839 Thrombocytosis, unspecified: Secondary | ICD-10-CM

## 2019-02-28 DIAGNOSIS — L4050 Arthropathic psoriasis, unspecified: Secondary | ICD-10-CM | POA: Diagnosis not present

## 2019-02-28 DIAGNOSIS — M19042 Primary osteoarthritis, left hand: Secondary | ICD-10-CM

## 2019-02-28 DIAGNOSIS — M19072 Primary osteoarthritis, left ankle and foot: Secondary | ICD-10-CM

## 2019-02-28 DIAGNOSIS — L409 Psoriasis, unspecified: Secondary | ICD-10-CM

## 2019-02-28 DIAGNOSIS — Z79899 Other long term (current) drug therapy: Secondary | ICD-10-CM | POA: Diagnosis not present

## 2019-02-28 DIAGNOSIS — M19071 Primary osteoarthritis, right ankle and foot: Secondary | ICD-10-CM

## 2019-02-28 DIAGNOSIS — M5136 Other intervertebral disc degeneration, lumbar region: Secondary | ICD-10-CM

## 2019-02-28 DIAGNOSIS — Z9884 Bariatric surgery status: Secondary | ICD-10-CM

## 2019-02-28 DIAGNOSIS — M19041 Primary osteoarthritis, right hand: Secondary | ICD-10-CM

## 2019-02-28 DIAGNOSIS — M51369 Other intervertebral disc degeneration, lumbar region without mention of lumbar back pain or lower extremity pain: Secondary | ICD-10-CM

## 2019-02-28 DIAGNOSIS — D473 Essential (hemorrhagic) thrombocythemia: Secondary | ICD-10-CM

## 2019-02-28 NOTE — Patient Instructions (Signed)
Standing Labs We placed an order today for your standing lab work.    Please come back and get your standing labs in December and every 3 months   We have open lab daily Monday through Thursday from 8:30-12:30 PM and 1:30-4:30 PM and Friday from 8:30-12:30 PM and 1:30-4:00 PM at the office of Dr. Shaili Deveshwar.   You may experience shorter wait times on Monday and Friday afternoons. The office is located at [address], [address], [place], [place] [address] No appointment is necessary.   Labs are drawn by [facility].  You may receive a bill from [facility] for your lab work.  If you wish to have your labs drawn at another location, please call the office 24 hours in advance to send orders.  If you have any questions regarding directions or hours of operation,  please call [phone].   Just as a reminder please drink plenty of water prior to coming for your lab work. Thanks!   

## 2019-04-14 ENCOUNTER — Other Ambulatory Visit: Payer: Self-pay | Admitting: Physician Assistant

## 2019-04-14 NOTE — Telephone Encounter (Addendum)
Last Visit: 02/28/2019 Next Visit: 07/27/2019 Labs: 01/17/2019 Glucose is elevated-127. ALT is elevated-49. Please advise patient to reduce MTX to 5 tablets by mouth once weekly. Recheck LFTs in 1 month.  plts are mildly elevated. We will continue to monitor. Rest of CBC WNL 02/09/2019 LFTs are WNL  Attempted to contact patient and left message on machine to advise patient to call the office. Need to clarify MTX dose. Per patient, she is still taking MTX 5 tablets once weekly.   Okay to refill per Dr. Estanislado Pandy.

## 2019-05-02 ENCOUNTER — Telehealth: Payer: Self-pay | Admitting: Rheumatology

## 2019-05-02 NOTE — Telephone Encounter (Signed)
Patient is scheduled for a bilateral laminectomy on 05/18/2019.   How long should patient hold medications?   Patient is due for cosentyx injection on 05/05/2019 and methotrexate on 05/03/2019.

## 2019-05-02 NOTE — Telephone Encounter (Signed)
Patient left a voicemail stating she was returning Marissa's call to let her know that her surgery is scheduled for 05/18/2019.

## 2019-05-03 NOTE — Telephone Encounter (Signed)
Attempted to contact patient and left message on machine to advise patient to hold both MTX and Cosentyx doses prior to surgery.  She should not take MTX today or cosentyx on on 05/05/19.  She will need to be cleared by her surgeon prior to restarting on these medications during the postoperative period.  advised patient to call the office with any questions.

## 2019-05-03 NOTE — Telephone Encounter (Signed)
Please advise the patient to hold both MTX and Cosentyx doses prior to surgery.  She should not take MTX today or cosentyx on on 05/05/19.  She will need to be cleared by her surgeon prior to restarting on these medications during the postoperative period.

## 2019-05-18 HISTORY — PX: BACK SURGERY: SHX140

## 2019-05-29 ENCOUNTER — Other Ambulatory Visit: Payer: Self-pay | Admitting: Rheumatology

## 2019-05-29 DIAGNOSIS — L409 Psoriasis, unspecified: Secondary | ICD-10-CM

## 2019-05-29 DIAGNOSIS — L4050 Arthropathic psoriasis, unspecified: Secondary | ICD-10-CM

## 2019-05-29 MED ORDER — COSENTYX SENSOREADY (300 MG) 150 MG/ML ~~LOC~~ SOAJ: 300.0000 mg | SUBCUTANEOUS | 0 refills | Status: DC

## 2019-05-29 NOTE — Telephone Encounter (Signed)
CBC and CMP were WNL on 05/10/19.  Ok to refill 90 supply of Cosentyx.   Please advise patient to update TB gold in March 2021.

## 2019-05-29 NOTE — Telephone Encounter (Signed)
Patient called requesting prescription refill of Cosentyx to be sent to [facility].

## 2019-05-29 NOTE — Telephone Encounter (Signed)
Last Visit: 02/28/2019 Next Visit: 07/27/2019 Labs: 01/17/2019 Glucose is elevated-127. ALT is elevated-49. Please advise patient to reduce MTX to 5 tablets by mouth once weekly. Recheck LFTs in 1 month.  plts are mildly elevated. We will continue to monitor. Rest of CBC WNL 02/09/2019 LFTs are WNL TB Gold: 06/27/18 Neg   Left message to advise patient she is due to update labs.   Okay to refill 30 day supply Cosentyx?

## 2019-05-30 ENCOUNTER — Other Ambulatory Visit: Payer: Self-pay

## 2019-05-30 DIAGNOSIS — Z79899 Other long term (current) drug therapy: Secondary | ICD-10-CM

## 2019-05-30 DIAGNOSIS — Z111 Encounter for screening for respiratory tuberculosis: Secondary | ICD-10-CM

## 2019-05-31 ENCOUNTER — Telehealth: Payer: Self-pay | Admitting: Rheumatology

## 2019-05-31 NOTE — Telephone Encounter (Signed)
Advised patient of lab results and recommendations, see lab note for details.

## 2019-05-31 NOTE — Progress Notes (Addendum)
Glucose is mildly elevated.  Most likely the sample was obtained while  she was not fasting.  ALT is mildly elevated most likely due to methotrexate use.  We can continue to monitor ALT.Mild anemia noted.  Please advise patient to take a Multivitamin with iron.If patient is having any blood in her stool or darker stools she should contact her PCP.  Please forward labs to her PCP.

## 2019-05-31 NOTE — Telephone Encounter (Signed)
Patient called stating she was returning your call regarding her labwork results. 

## 2019-06-02 LAB — CBC WITH DIFFERENTIAL/PLATELET
Absolute Monocytes: 540 cells/uL (ref 200–950)
Basophils Absolute: 39 cells/uL (ref 0–200)
Basophils Relative: 0.6 %
Eosinophils Absolute: 189 cells/uL (ref 15–500)
Eosinophils Relative: 2.9 %
HCT: 34.7 % — ABNORMAL LOW (ref 35.0–45.0)
Hemoglobin: 11.2 g/dL — ABNORMAL LOW (ref 11.7–15.5)
Lymphs Abs: 1528 cells/uL (ref 850–3900)
MCH: 29.1 pg (ref 27.0–33.0)
MCHC: 32.3 g/dL (ref 32.0–36.0)
MCV: 90.1 fL (ref 80.0–100.0)
MPV: 9.5 fL (ref 7.5–12.5)
Monocytes Relative: 8.3 %
Neutro Abs: 4206 cells/uL (ref 1500–7800)
Neutrophils Relative %: 64.7 %
Platelets: 459 10*3/uL — ABNORMAL HIGH (ref 140–400)
RBC: 3.85 10*6/uL (ref 3.80–5.10)
RDW: 12.1 % (ref 11.0–15.0)
Total Lymphocyte: 23.5 %
WBC: 6.5 10*3/uL (ref 3.8–10.8)

## 2019-06-02 LAB — COMPLETE METABOLIC PANEL WITH GFR
AG Ratio: 1.2 (calc) (ref 1.0–2.5)
ALT: 38 U/L — ABNORMAL HIGH (ref 6–29)
AST: 18 U/L (ref 10–35)
Albumin: 3.5 g/dL — ABNORMAL LOW (ref 3.6–5.1)
Alkaline phosphatase (APISO): 149 U/L (ref 37–153)
BUN: 10 mg/dL (ref 7–25)
CO2: 29 mmol/L (ref 20–32)
Calcium: 9 mg/dL (ref 8.6–10.4)
Chloride: 104 mmol/L (ref 98–110)
Creat: 0.72 mg/dL (ref 0.50–1.05)
GFR, Est African American: 108 mL/min/{1.73_m2} (ref >=60)
GFR, Est Non African American: 94 mL/min/{1.73_m2} (ref >=60)
Globulin: 3 g/dL (calc) (ref 1.9–3.7)
Glucose, Bld: 123 mg/dL — ABNORMAL HIGH (ref 65–99)
Potassium: 4.6 mmol/L (ref 3.5–5.3)
Sodium: 141 mmol/L (ref 135–146)
Total Bilirubin: 0.3 mg/dL (ref 0.2–1.2)
Total Protein: 6.5 g/dL (ref 6.1–8.1)

## 2019-06-02 LAB — QUANTIFERON-TB GOLD PLUS
Mitogen-NIL: >10 IU/mL
NIL: 0.02 IU/mL
QuantiFERON-TB Gold Plus: NEGATIVE
TB1-NIL: 0.03 IU/mL
TB2-NIL: 0.03 IU/mL

## 2019-06-08 ENCOUNTER — Telehealth: Payer: Self-pay | Admitting: *Deleted

## 2019-06-08 DIAGNOSIS — L4050 Arthropathic psoriasis, unspecified: Secondary | ICD-10-CM

## 2019-06-08 DIAGNOSIS — L409 Psoriasis, unspecified: Secondary | ICD-10-CM

## 2019-06-08 MED ORDER — COSENTYX SENSOREADY (300 MG) 150 MG/ML ~~LOC~~ SOAJ: 300.0000 mg | SUBCUTANEOUS | 0 refills | Status: DC

## 2019-06-08 NOTE — Telephone Encounter (Signed)
Received fax from [facility] stating patient must fill Cosentyx with [facility]  Last Visit:02/28/2019 Next Visit:07/27/2019 Labs: 05/10/19 CBC and CMP were WNL  TB Gold: 06/27/18 Neg   Okay to refill per Dr. Estanislado Pandy

## 2019-06-13 ENCOUNTER — Telehealth: Payer: Self-pay | Admitting: Rheumatology

## 2019-06-13 NOTE — Telephone Encounter (Signed)
cosentyx prescription was sent to [facility] on 06/08/2019. I attempted to contact patient and left message on machine to advise and provided pharmacy phone number. Also advised patient she could pick up a sample.

## 2019-06-13 NOTE — Telephone Encounter (Signed)
Medication Samples have been provided to the patient.  Drug name: cosentyx Strength: 300mg      Qty: 1 box (2 pens) LOT: [id number] Exp.Date: 08/2020  Dosing instructions: Inject 300mg  into the skin every 28 days.   The patient has been instructed regarding the correct time, dose, and frequency of taking this medication, including desired effects and most common side effects.   Connie Hoffman 11:37 AM 06/13/2019

## 2019-06-13 NOTE — Telephone Encounter (Signed)
Patient called stating this morning she called [facility] to check the status of her prescription refill of Cosentyx.  Patient states [facility] told her due to her insurance change her prescription needs to be sent to [facility].  Patient states she was unaware that her prescription needs to be sent to another pharmacy and is due to take her injection on Monday, 06/19/19.  Patient is requesting a return call.

## 2019-06-23 ENCOUNTER — Telehealth: Payer: Self-pay | Admitting: Rheumatology

## 2019-06-23 NOTE — Telephone Encounter (Signed)
Patient called stating she received a letter from [facility] stating her prescription of Cosentyx is not covered.  Patient states they need documentation showing why she needs the medication.  Patient is requesting a return call to let her know what she should do next.

## 2019-06-23 NOTE — Telephone Encounter (Signed)
Called patient had to leave message to advise that we will be working on insurance coverage for her Cosentyx.

## 2019-06-26 ENCOUNTER — Telehealth: Payer: Self-pay | Admitting: Pharmacy Technician

## 2019-06-26 NOTE — Telephone Encounter (Signed)
Received notification from [facility] regarding a prior authorization for [place]. Authorization has been APPROVED from 06/26/19 to 04/26/20.   Authorization # [id number] Phone # [phone]

## 2019-06-26 NOTE — Telephone Encounter (Signed)
Submitted a Prior Authorization request to [facility] for COSENTYX via Cover My Meds. Will update once we receive a response.    

## 2019-07-06 ENCOUNTER — Other Ambulatory Visit: Payer: Self-pay | Admitting: Rheumatology

## 2019-07-06 NOTE — Telephone Encounter (Signed)
Last Visit:02/28/2019 Next Visit:07/27/2019 Labs:05/30/19 glucose mildly elevated. ALT is mildly elevated. Mild anemia noted  Okay to refill per Dr. Estanislado Pandy

## 2019-07-20 NOTE — Progress Notes (Signed)
Office Visit Note  Patient: Connie Hoffman             Date of Birth: 1960/05/29           MRN: [id number]             PCP: Jenel Lucks, PA-C Referring: Burna Cash* Visit Date: 07/27/2019 Occupation: @GUAROCC @  Subjective:  Right wrist joint pain   History of Present Illness: Connie Hoffman is a 59 y.o. female with history of psoriatic arthritis, osteoarthritis, and DDD. She is on cosentyx 300 mg sq injections every 28 days, MTX 5 tablets po once weekly, and folic acid 2 mg po daily. She has not missed any doses recently. She denies any recent infections.  She has received her first covid-19 vaccination. She states she has ongoing pain and intermittent inflammation in the right wrist joint and hand.  She states the right wrist pain was exacerbated by painting yesterday.  She reports intermittent pain and swelling in the right knee and right ankle joint.    Activities of Daily Living:  Patient reports morning stiffness for 45-60 minutes.   Patient Denies nocturnal pain.  Difficulty dressing/grooming: Denies Difficulty climbing stairs: Denies Difficulty getting out of chair: Denies Difficulty using hands for taps, buttons, cutlery, and/or writing: Reports  Review of Systems  Constitutional: Positive for fatigue.  HENT: Negative for mouth sores, mouth dryness and nose dryness.   Eyes: Positive for dryness. Negative for pain, itching and visual disturbance.  Respiratory: Negative for cough, hemoptysis, shortness of breath and difficulty breathing.   Cardiovascular: Negative for chest pain, palpitations, hypertension and swelling in legs/feet.  Gastrointestinal: Negative for blood in stool, constipation and diarrhea.  Endocrine: Negative for increased urination.  Genitourinary: Negative for difficulty urinating and painful urination.  Musculoskeletal: Positive for arthralgias, joint pain, joint swelling and morning stiffness. Negative for myalgias,  muscle weakness, muscle tenderness and myalgias.  Skin: Positive for rash. Negative for color change, pallor, hair loss, nodules/bumps, redness, skin tightness, ulcers and sensitivity to sunlight.  Allergic/Immunologic: Negative for susceptible to infections.  Neurological: Negative for dizziness, numbness, headaches, memory loss and weakness.  Hematological: Negative for swollen glands.  Psychiatric/Behavioral: Negative for depressed mood, confusion and sleep disturbance. The patient is not nervous/anxious.     PMFS History:  Patient Active Problem List   Diagnosis Date Noted  . DDD (degenerative disc disease), lumbar 01/21/2018  . Primary osteoarthritis of both hands 01/21/2018  . Primary osteoarthritis of both feet 01/21/2018  . Psoriasis 12/01/2017  . Psoriatic arthritis ([place]) 12/01/2017  . High risk medication use 12/01/2017  . 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 Thrombocytosis ([place]) 12/01/2017  . 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 DDD (degenerative disc disease), lumbar   . Psoriatic arthritis ([place])   . Spinal stenosis     Family History  Problem Relation Age of Onset  . Diabetes Mother   . Hypertension Mother   . Heart Problems Mother   . Congestive Heart Failure Mother   . Diabetes Father   . Hypertension Father   . Stroke Sister   . Heart Problems Sister   . Prostate cancer Brother   . Diabetes Brother   . Asthma Daughter   . Healthy Daughter   . Healthy Son   . Healthy Son    Past Surgical History:  Procedure Laterality Date  . ABDOMINAL HYSTERECTOMY    . BACK SURGERY  05/18/2019  L4 and L5  . CHOLECYSTECTOMY    . CYSTECTOMY  07/2017  . JOINT REPLACEMENT Left    left knee  . [place] RESECTION  2018  . LUMBAR EPIDURAL INJECTION  04/13/2018  . REVISION TOTAL KNEE ARTHROPLASTY Left 12/2017  . TOTAL SHOULDER ARTHROPLASTY Bilateral    Social History   Social History Narrative   . Not on file   Immunization History  Administered Date(s) Administered  . Influenza,inj,Quad PF,6+ Mos 04/15/2018  . Pneumococcal Polysaccharide-23 04/15/2018  . Zoster Recombinat (Shingrix) 05/13/2018     Objective: Vital Signs: BP (!) 141/81 (BP Location: Left Wrist, Patient Position: Sitting, Cuff Size: Normal)   Pulse 64   Resp 14   Ht 5\' 5"  (1.651 m)   Wt 261 lb (118.4 kg)   BMI 43.43 kg/m    Physical Exam Vitals and nursing note reviewed.  Constitutional:      Appearance: She is well-developed.  HENT:     Head: Normocephalic and atraumatic.  Eyes:     Conjunctiva/sclera: Conjunctivae normal.  Pulmonary:     Effort: Pulmonary effort is normal.  Abdominal:     General: Bowel sounds are normal.     Palpations: Abdomen is soft.  Musculoskeletal:     Cervical back: Normal range of motion.  Lymphadenopathy:     Cervical: No cervical adenopathy.  Skin:    General: Skin is warm and dry.     Capillary Refill: Capillary refill takes less than 2 seconds.  Neurological:     Mental Status: She is alert and oriented to person, place, and time.  Psychiatric:        Behavior: Behavior normal.      Musculoskeletal Exam:  C-spine, thoracic spine, and lumbar spine good ROM.  Shoulder joints, elbow joints, wrist joints, MCPs, PIPs, and DIPs good ROM.  Tenderness of the right 1st, 2nd, and 3rd MCP joints.  Synovitis of right 3rd MCP.  Complete fist formation bilaterally.  Hip joints good ROM with no discomfort.  Left knee replacement good ROM with no discomfort.  Right knee joint, ankle joints, MTPs, PIPs, and DIPs good ROM with no synovitis.  No warmth or effusion of knee joints.  No tenderness or swelling of ankle joints.    CDAI Exam: CDAI Score: -- Patient Global: --; Provider Global: -- Swollen: 1 ; Tender: 3  Joint Exam 07/27/2019      Right  Left  MCP 1   Tender     MCP 2   Tender     MCP 3  Swollen Tender        Investigation: No additional  findings.  Imaging: No results found.  Recent Labs: Lab Results  Component Value Date   WBC 6.5 05/30/2019   HGB 11.2 (L) 05/30/2019   PLT 459 (H) 05/30/2019   NA 141 05/30/2019   K 4.6 05/30/2019   CL 104 05/30/2019   CO2 29 05/30/2019   GLUCOSE 123 (H) 05/30/2019   BUN 10 05/30/2019   CREATININE 0.72 05/30/2019   BILITOT 0.3 05/30/2019   AST 18 05/30/2019   ALT 38 (H) 05/30/2019   PROT 6.5 05/30/2019   CALCIUM 9.0 05/30/2019   GFRAA 108 05/30/2019   QFTBGOLDPLUS NEGATIVE 05/30/2019    Speciality Comments: No specialty comments available.  Procedures:  No procedures performed Allergies: Aspirin   Assessment / Plan:     Visit Diagnoses: Psoriatic arthritis ([place]): She has tenderness of the right 1st, 2nd, and 3rd MCPs and synovitis of  the right 3rd MCP joint.  She has complete fist formation bilaterally.  She has intermittent pain and inflammation in the right wrist joint and right hand, which is exacerbated by painting on a regular basis.  She is overall clinically doing well on Cosentyx 300 mg sq injections every 28 days, MTX 5 tablets po once weekly, and folic acid 2 mg po daily.  She declined a prednisone taper.  She will continue on the current treatment regimen.  She was advised to notify us if she develops increased joint pain or joint swelling.  She will follow up in 5 months.  Psoriasis: She has on psoriasis at this time.   High risk medication use - Cosentyx 300 mg every 28 days, methotrexate 5 tablets every 7 days, and folic acid 2 mg daily. CBC and CMP were drawn on 05/30/19. She will return for CBC and CMP in May and every 3 months.  Standing orders are in place. TB gold negative on 05/30/19.  She will be receiving her second covid-19 vaccination next week.   S/P total knee replacement, left - Left knee joint revision December 06, 2017, September 2019 manipulation: She has good ROM with no discomfort at this time.  Warmth but no effusion noted.  She is not having any  discomfort at this time.   DDD (degenerative disc disease), lumbar - with spinal stenosis: She experiences intermittent lower back pain.  She has no symptoms of radiculopathy.   Primary osteoarthritis of both hands:Joint protection and muscle strengthening were discussed.   Primary osteoarthritis of both feet: She is not experiencing any discomfort in her feet at this time.  Ankle joints have good ROM with no tenderness or synovitis.   Candidal intertrigo: She has red-brown homogeneous patches within skin folds in inframammary area and axillary area. A prescription for ketoconazole cream was sent to the pharmacy.  She was advised to apply once daily to cover the affected area and immediate surrounding area for 2 weeks.  She plans on scheduling a follow-up visit with her dermatologist for further evaluation and treatment if the rash persists or worsens.  Other medical conditions are listed as follows:    History of gastric bypass  Thrombocytosis ([place])  Orders: No orders of the defined types were placed in this encounter.  Meds ordered this encounter  Medications  . ketoconazole (NIZORAL) 2 % cream    Sig: Apply 1 application topically daily.    Dispense:  15 g    Refill:  0     Follow-Up Instructions: Return in about 5 months (around 12/27/2019) for Psoriatic arthritis.   Ofilia Neas, PA-C  Note - This record has been created using Dragon software.  Chart creation errors have been sought, but may not always  have been located. Such creation errors do not reflect on  the standard of medical care.

## 2019-07-27 ENCOUNTER — Ambulatory Visit (INDEPENDENT_AMBULATORY_CARE_PROVIDER_SITE_OTHER): Payer: 59 | Admitting: Physician Assistant

## 2019-07-27 ENCOUNTER — Encounter: Payer: Self-pay | Admitting: Physician Assistant

## 2019-07-27 ENCOUNTER — Other Ambulatory Visit: Payer: Self-pay

## 2019-07-27 VITALS — BP 141/81 | HR 64 | Resp 14 | Ht 65.0 in | Wt 261.0 lb

## 2019-07-27 DIAGNOSIS — D473 Essential (hemorrhagic) thrombocythemia: Secondary | ICD-10-CM

## 2019-07-27 DIAGNOSIS — L409 Psoriasis, unspecified: Secondary | ICD-10-CM

## 2019-07-27 DIAGNOSIS — Z79899 Other long term (current) drug therapy: Secondary | ICD-10-CM

## 2019-07-27 DIAGNOSIS — L4050 Arthropathic psoriasis, unspecified: Secondary | ICD-10-CM | POA: Diagnosis not present

## 2019-07-27 DIAGNOSIS — Z96652 Presence of left artificial knee joint: Secondary | ICD-10-CM | POA: Diagnosis not present

## 2019-07-27 DIAGNOSIS — M19041 Primary osteoarthritis, right hand: Secondary | ICD-10-CM

## 2019-07-27 DIAGNOSIS — M19071 Primary osteoarthritis, right ankle and foot: Secondary | ICD-10-CM

## 2019-07-27 DIAGNOSIS — M5136 Other intervertebral disc degeneration, lumbar region: Secondary | ICD-10-CM

## 2019-07-27 DIAGNOSIS — B372 Candidiasis of skin and nail: Secondary | ICD-10-CM

## 2019-07-27 DIAGNOSIS — M19042 Primary osteoarthritis, left hand: Secondary | ICD-10-CM

## 2019-07-27 DIAGNOSIS — Z9884 Bariatric surgery status: Secondary | ICD-10-CM

## 2019-07-27 DIAGNOSIS — D75839 Thrombocytosis, unspecified: Secondary | ICD-10-CM

## 2019-07-27 DIAGNOSIS — M19072 Primary osteoarthritis, left ankle and foot: Secondary | ICD-10-CM

## 2019-07-27 MED ORDER — KETOCONAZOLE 2 % EX CREA: 1.0000 "application " | TOPICAL_CREAM | Freq: Every day | CUTANEOUS | 0 refills | Status: DC

## 2019-07-27 NOTE — Patient Instructions (Signed)
Standing Labs We placed an order today for your standing lab work.    Please come back and get your standing labs in May and every 3 months   We have open lab daily Monday through Thursday from 8:30-12:30 PM and 1:30-4:30 PM and Friday from 8:30-12:30 PM and 1:30-4:00 PM at the office of Dr. Shaili Deveshwar.   You may experience shorter wait times on Monday and Friday afternoons. The office is located at [address], [address], [place], [place] [address] No appointment is necessary.   Labs are drawn by [facility].  You may receive a bill from [facility] for your lab work.  If you wish to have your labs drawn at another location, please call the office 24 hours in advance to send orders.  If you have any questions regarding directions or hours of operation,  please call [phone].   Just as a reminder please drink plenty of water prior to coming for your lab work. Thanks!   

## 2019-08-13 ENCOUNTER — Other Ambulatory Visit: Payer: Self-pay | Admitting: Rheumatology

## 2019-08-13 DIAGNOSIS — L4050 Arthropathic psoriasis, unspecified: Secondary | ICD-10-CM

## 2019-08-13 DIAGNOSIS — L409 Psoriasis, unspecified: Secondary | ICD-10-CM

## 2019-08-17 ENCOUNTER — Telehealth: Payer: Self-pay | Admitting: Rheumatology

## 2019-08-17 DIAGNOSIS — L4050 Arthropathic psoriasis, unspecified: Secondary | ICD-10-CM

## 2019-08-17 DIAGNOSIS — L409 Psoriasis, unspecified: Secondary | ICD-10-CM

## 2019-08-17 MED ORDER — COSENTYX SENSOREADY (300 MG) 150 MG/ML ~~LOC~~ SOAJ: 300.0000 mg | SUBCUTANEOUS | 0 refills | Status: DC

## 2019-08-17 NOTE — Telephone Encounter (Signed)
Last Visit: 07/27/2019 Next Visit: 01/05/2020 Labs: 05/30/2019 Leukosis mildly elevated. Most likely the sample was obtained now she was not fasting. ALT is mildly elevated most likely due to methotrexate use. We can continue to monitor it.Mild anemia noted.  TB Gold: 05/30/2019 negative   Okay to refill per Dr. Estanislado Pandy.   Advised patient that refill has been sent.

## 2019-08-17 NOTE — Telephone Encounter (Signed)
Patient requesting a refill on Cosentyx sent to [facility].

## 2019-09-07 ENCOUNTER — Other Ambulatory Visit: Payer: Self-pay

## 2019-09-07 DIAGNOSIS — Z79899 Other long term (current) drug therapy: Secondary | ICD-10-CM

## 2019-09-08 LAB — CBC WITH DIFFERENTIAL/PLATELET
Absolute Monocytes: 413 cells/uL (ref 200–950)
Basophils Absolute: 38 cells/uL (ref 0–200)
Basophils Relative: 0.8 %
Eosinophils Absolute: 163 cells/uL (ref 15–500)
Eosinophils Relative: 3.4 %
HCT: 37.3 % (ref 35.0–45.0)
Hemoglobin: 12.2 g/dL (ref 11.7–15.5)
Lymphs Abs: 1118 cells/uL (ref 850–3900)
MCH: 29.5 pg (ref 27.0–33.0)
MCHC: 32.7 g/dL (ref 32.0–36.0)
MCV: 90.1 fL (ref 80.0–100.0)
MPV: 10.5 fL (ref 7.5–12.5)
Monocytes Relative: 8.6 %
Neutro Abs: 3067 cells/uL (ref 1500–7800)
Neutrophils Relative %: 63.9 %
Platelets: 361 10*3/uL (ref 140–400)
RBC: 4.14 10*6/uL (ref 3.80–5.10)
RDW: 13.1 % (ref 11.0–15.0)
Total Lymphocyte: 23.3 %
WBC: 4.8 10*3/uL (ref 3.8–10.8)

## 2019-09-08 LAB — COMPLETE METABOLIC PANEL WITH GFR
AG Ratio: 1.4 (calc) (ref 1.0–2.5)
ALT: 11 U/L (ref 6–29)
AST: 13 U/L (ref 10–35)
Albumin: 4 g/dL (ref 3.6–5.1)
Alkaline phosphatase (APISO): 91 U/L (ref 37–153)
BUN: 11 mg/dL (ref 7–25)
CO2: 26 mmol/L (ref 20–32)
Calcium: 9.3 mg/dL (ref 8.6–10.4)
Chloride: 109 mmol/L (ref 98–110)
Creat: 0.67 mg/dL (ref 0.50–1.05)
GFR, Est African American: 113 mL/min/{1.73_m2} (ref >=60)
GFR, Est Non African American: 98 mL/min/{1.73_m2} (ref >=60)
Globulin: 2.9 g/dL (calc) (ref 1.9–3.7)
Glucose, Bld: 107 mg/dL — ABNORMAL HIGH (ref 65–99)
Potassium: 4.6 mmol/L (ref 3.5–5.3)
Sodium: 144 mmol/L (ref 135–146)
Total Bilirubin: 0.3 mg/dL (ref 0.2–1.2)
Total Protein: 6.9 g/dL (ref 6.1–8.1)

## 2019-09-08 NOTE — Progress Notes (Signed)
CBC and CMP are normal.

## 2019-10-10 ENCOUNTER — Encounter (HOSPITAL_COMMUNITY): Payer: Self-pay | Admitting: Emergency Medicine

## 2019-10-10 ENCOUNTER — Other Ambulatory Visit: Payer: Self-pay

## 2019-10-10 ENCOUNTER — Emergency Department (HOSPITAL_COMMUNITY)
Admission: EM | Admit: 2019-10-10 | Discharge: 2019-10-10 | Disposition: A | Discharge status: Home / Self Care | Payer: 59 | Attending: Emergency Medicine | Admitting: Emergency Medicine

## 2019-10-10 ENCOUNTER — Emergency Department (HOSPITAL_COMMUNITY): Payer: 59

## 2019-10-10 DIAGNOSIS — W1830XA Fall on same level, unspecified, initial encounter: Secondary | ICD-10-CM | POA: Diagnosis not present

## 2019-10-10 DIAGNOSIS — W19XXXA Unspecified fall, initial encounter: Secondary | ICD-10-CM

## 2019-10-10 DIAGNOSIS — Y92014 Private driveway to single-family (private) house as the place of occurrence of the external cause: Secondary | ICD-10-CM | POA: Insufficient documentation

## 2019-10-10 DIAGNOSIS — Y9301 Activity, walking, marching and hiking: Secondary | ICD-10-CM | POA: Insufficient documentation

## 2019-10-10 DIAGNOSIS — S39012A Strain of muscle, fascia and tendon of lower back, initial encounter: Secondary | ICD-10-CM | POA: Diagnosis not present

## 2019-10-10 DIAGNOSIS — M545 Low back pain: Secondary | ICD-10-CM | POA: Diagnosis present

## 2019-10-10 DIAGNOSIS — Z79899 Other long term (current) drug therapy: Secondary | ICD-10-CM | POA: Diagnosis not present

## 2019-10-10 DIAGNOSIS — Y999 Unspecified external cause status: Secondary | ICD-10-CM | POA: Insufficient documentation

## 2019-10-10 DIAGNOSIS — Z96652 Presence of left artificial knee joint: Secondary | ICD-10-CM | POA: Insufficient documentation

## 2019-10-10 IMAGING — DX DG LUMBAR SPINE COMPLETE 4+V
5 series · 5 of 5 positions shown · non-contrast
Comparison: Plain films lumbar spine [DATE].

CLINICAL DATA: Low back pain after a fall today Initial encounter.

EXAM:
LUMBAR SPINE - COMPLETE 4+ VIEW

[l-spine ap]
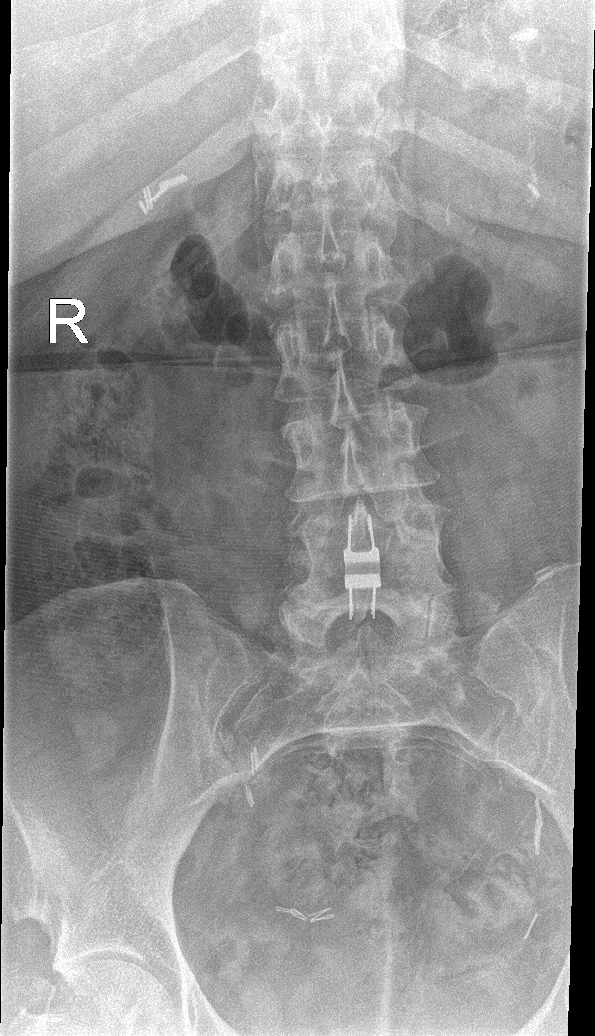

[l-spine obl (1 of 2)]
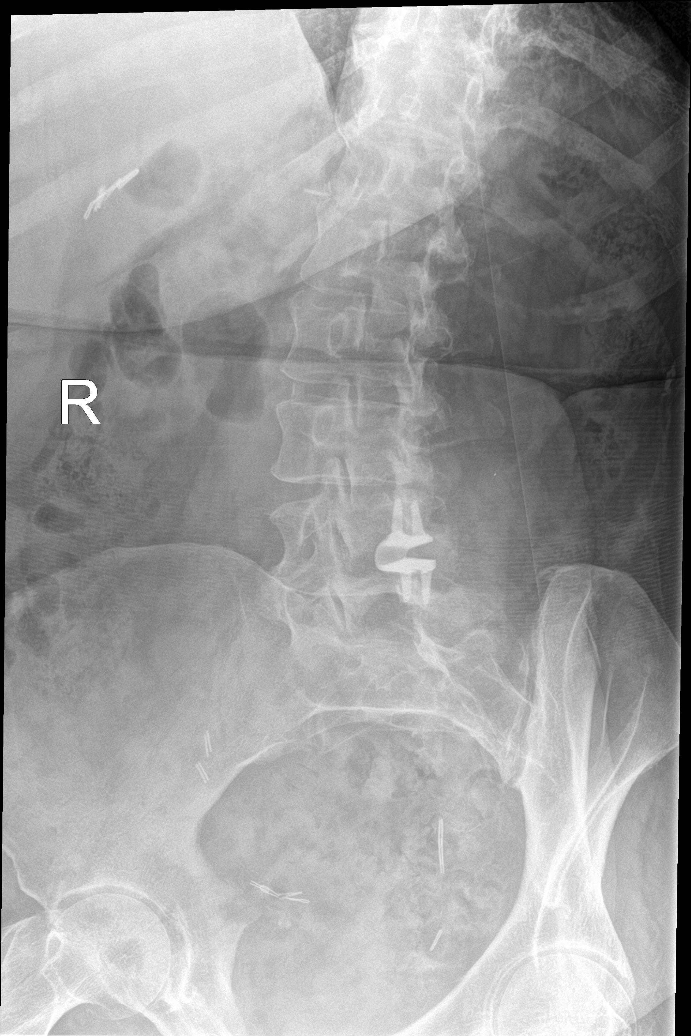

[l-spine obl (2 of 2)]
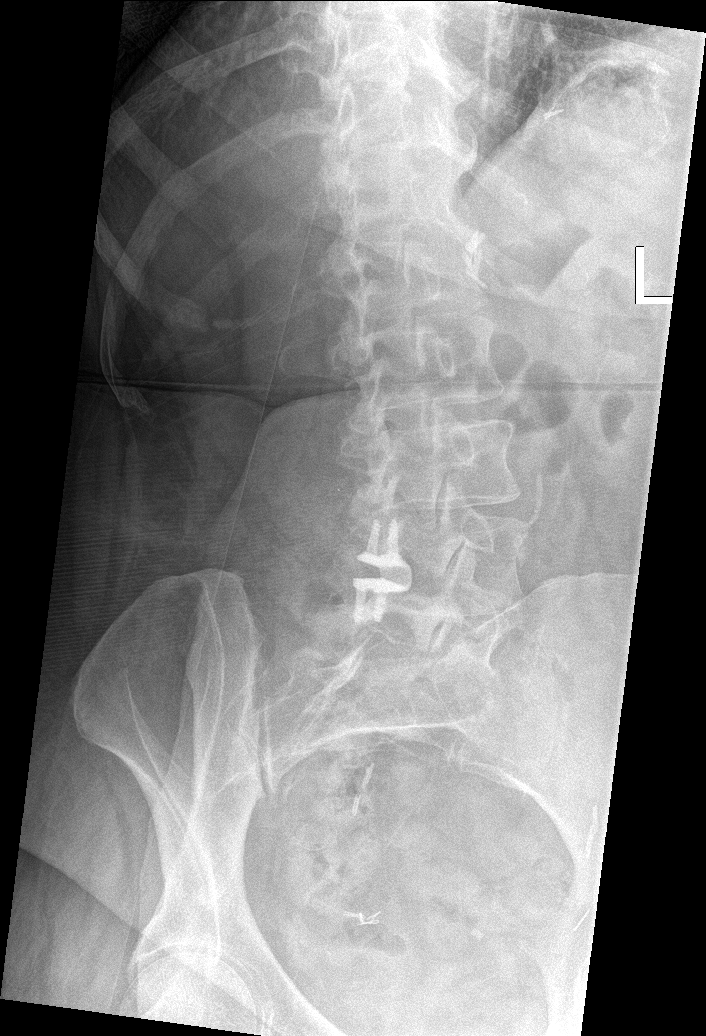

[l-spine lat]
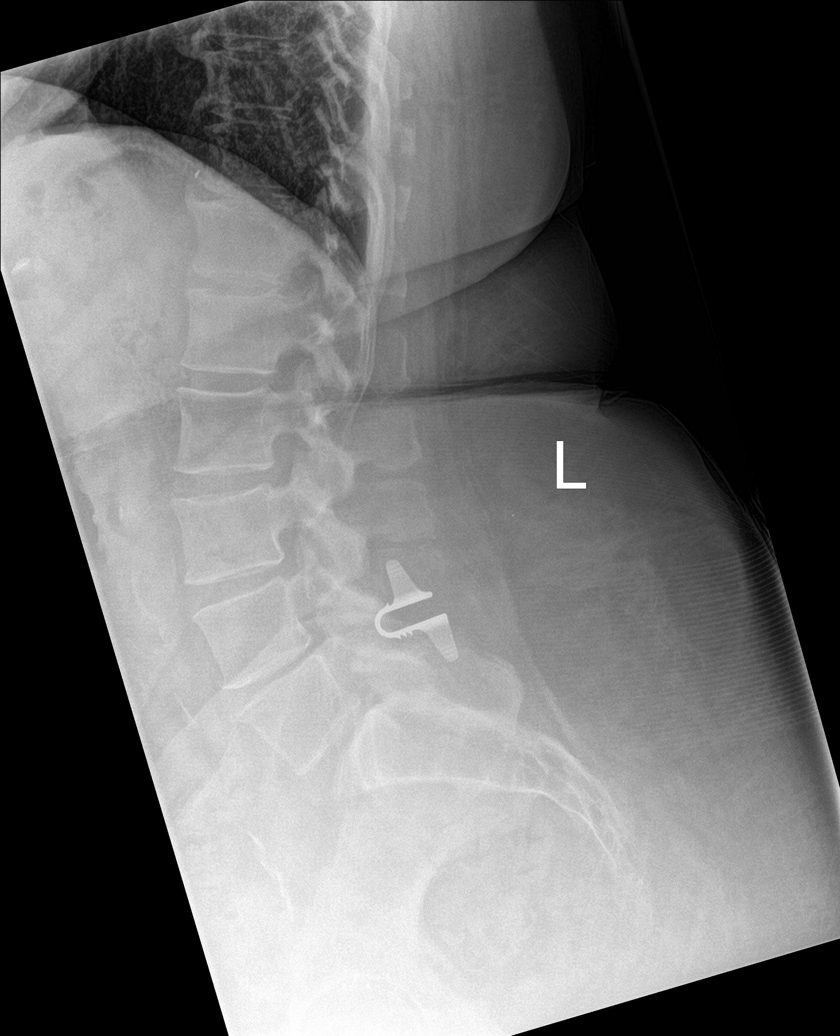

[l-spine spot]
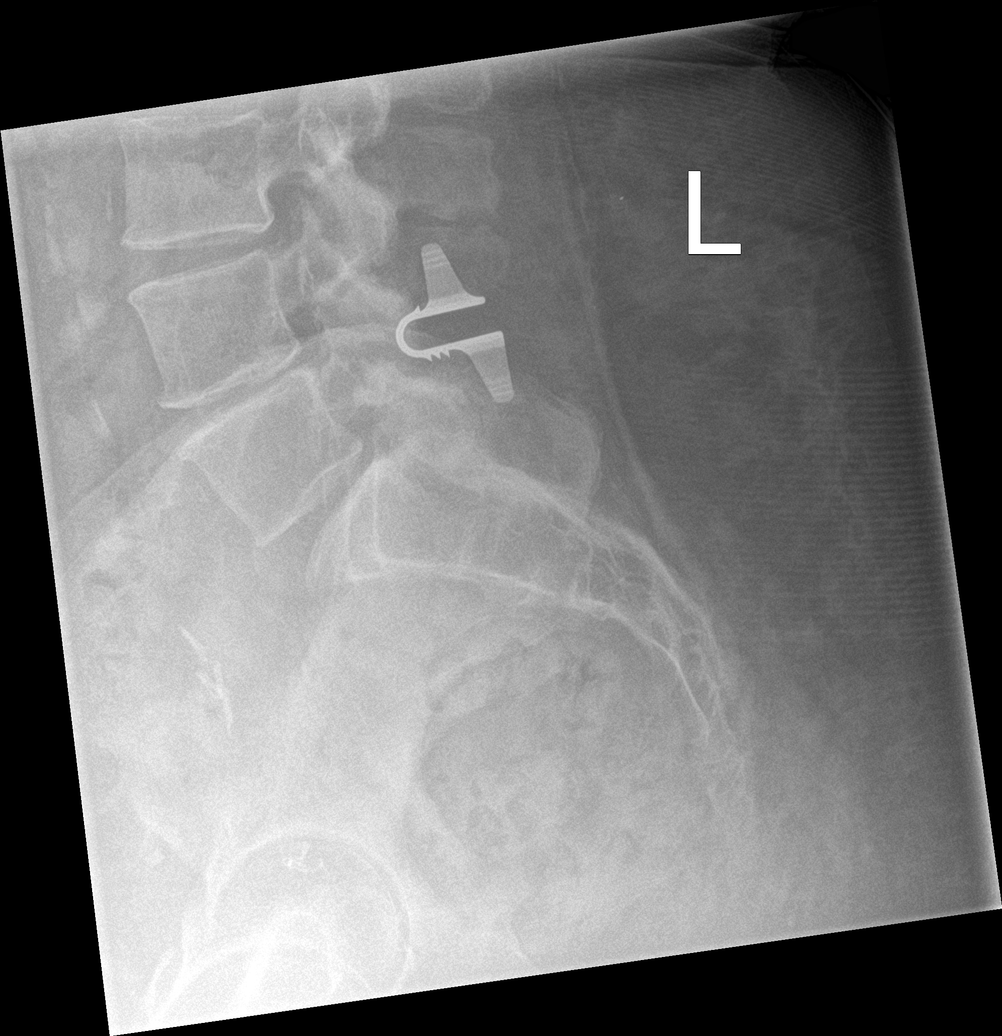

[5 of 5 positions shown; findings below may reference images not displayed]

FINDINGS: No fracture or malalignment. Posterior fusion hardware at L4-5 is
new since the prior exam. Loss of disc space height L4-5 noted.
IMPRESSION: No acute abnormality.

## 2019-10-10 NOTE — ED Provider Notes (Signed)
[facility] EMERGENCY DEPARTMENT Provider Note   CSN: [id number] Arrival date & time: 10/10/19  1342     History Chief Complaint  Patient presents with  . Back Pain  . Fall    Connie Hoffman is a 59 y.o. female.  59 y.o female with a PMH of Spinal stenosis, degenerative disc disease presents to the ED status post fall.  Patient reports she was ambulating down her driveway, she then thought to be lifting her right foot higher than her left after her toe injury, reports falling to the ground mainly landing on the right side of her body.  Reports pain along the lumbar spine, this has now improved since the incident.  She recently had a device placed in [place] for her degenerative disc disease.  She shows concern for the device being in place.  Patient has not taken any medication for improvement in her symptoms.  She denies any other injury or trauma.    The history is provided by the patient.       Past Medical History:  Diagnosis Date  . DDD (degenerative disc disease), lumbar   . Psoriatic arthritis ([place])   . Spinal stenosis     Patient Active Problem List   Diagnosis Date Noted  . DDD (degenerative disc disease), lumbar 01/21/2018  . Primary osteoarthritis of both hands 01/21/2018  . Primary osteoarthritis of both feet 01/21/2018  . Psoriasis 12/01/2017  . Psoriatic arthritis ([place]) 12/01/2017  . High risk medication use 12/01/2017  . 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 Thrombocytosis ([place]) 12/01/2017  . 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    Past Surgical History:  Procedure Laterality Date  . ABDOMINAL HYSTERECTOMY    . BACK SURGERY  05/18/2019   L4 and L5  . CHOLECYSTECTOMY    . CYSTECTOMY  07/2017  . JOINT REPLACEMENT Left    left knee  . [place] RESECTION  2018  . LUMBAR EPIDURAL INJECTION  04/13/2018  . REVISION TOTAL KNEE ARTHROPLASTY Left 12/2017  . TOTAL SHOULDER ARTHROPLASTY Bilateral       OB History   No obstetric history on file.     Family History  Problem Relation Age of Onset  . Diabetes Mother   . Hypertension Mother   . Heart Problems Mother   . Congestive Heart Failure Mother   . Diabetes Father   . Hypertension Father   . Stroke Sister   . Heart Problems Sister   . Prostate cancer Brother   . Diabetes Brother   . Asthma Daughter   . Healthy Daughter   . Healthy Son   . Healthy Son     Social History   Tobacco Use  . Smoking status: Never Smoker  . Smokeless tobacco: Never Used  Vaping Use  . Vaping Use: Never used  Substance Use Topics  . Alcohol use: Never  . Drug use: Never    Home Medications Prior to Admission medications   Medication Sig Start Date End Date Taking? Authorizing Provider  diphenhydramine-acetaminophen (TYLENOL PM) 25-500 MG TABS tablet Take 1 tablet by mouth at bedtime as needed.   Yes [provider]  folic acid (FOLVITE) 1 MG tablet Take 1 mg by mouth daily.   Yes [provider]  ketoconazole (NIZORAL) 2 % cream Apply 1 application topically daily. 07/27/19  Yes Ofilia Neas, PA-C  Menthol, Topical Analgesic, (BIOFREEZE) 4 % GEL Apply 1 application topically See admin instructions. Apply to painful sites  as directed   Yes [provider]  methotrexate 2.5 MG tablet TAKE 5 TABLETS BY MOUTH ONCE A WEEK. PROTECT FROM LIGHT. Patient taking differently: Take 12.5 mg by mouth once a week.  07/06/19  Yes Deveshwar, Abel Presto, MD  Multiple Vitamin (MULTIVITAMIN WITH MINERALS) TABS tablet Take 1 tablet by mouth daily.   Yes [provider]  Secukinumab, 300 MG Dose, (COSENTYX SENSOREADY, 300 MG,) 150 MG/ML SOAJ Inject 300 mg into the skin every 28 (twenty-eight) days. 08/17/19  Yes Deveshwar, Abel Presto, MD  triamcinolone ointment (KENALOG) 0.1 % Apply 1 application topically 2 (two) times daily as needed (for itching). Prn  10/05/17  Yes [provider]    Allergies    Aspirin  Review  of Systems   Review of Systems  Constitutional: Negative for fever.  Musculoskeletal: Positive for back pain and myalgias.    Physical Exam Updated Vital Signs BP 117/70   Pulse 71   Temp 98.1 F (36.7 C) (Oral)   Resp 18   Ht 5\' 5"  (1.651 m)   Wt 111.1 kg   SpO2 98%   BMI 40.77 kg/m   Physical Exam Vitals and nursing note reviewed.  Constitutional:      Appearance: Normal appearance.  HENT:     Head: Normocephalic and atraumatic.     Nose: Nose normal.  Cardiovascular:     Rate and Rhythm: Normal rate.  Pulmonary:     Effort: Pulmonary effort is normal.  Abdominal:     General: Abdomen is flat.  Musculoskeletal:        General: Tenderness present.     Cervical back: Normal range of motion and neck supple.     Lumbar back: Tenderness present. No bony tenderness.     Comments: Mild tenderness to palpation along the lumbar spine, no visible spasms.  Strength is preserved.  She is ambulatory with steady gait. RLE- KF,KE 5/5 strength LLE- HF, HE 5/5 strength Normal gait. No pronator drift. No leg drop.  CN I, II and VIII not tested. CN II-XII grossly intact bilaterally.     Skin:    General: Skin is warm and dry.  Neurological:     Mental Status: She is alert and oriented to person, place, and time.     ED Results / Procedures / Treatments   Labs (all labs ordered are listed, but only abnormal results are displayed) Labs Reviewed - No data to display  EKG None  Radiology DG Lumbar Spine Complete  Result Date: 10/10/2019 CLINICAL DATA:  Low back pain after a fall today Initial encounter. EXAM: LUMBAR SPINE - COMPLETE 4+ VIEW COMPARISON:  Plain films lumbar spine 02/08/2018. FINDINGS: No fracture or malalignment. Posterior fusion hardware at L4-5 is new since the prior exam. Loss of disc space height L4-5 noted. IMPRESSION: No acute abnormality. Electronically Signed   By: Inge Rise M.D.   On: 10/10/2019 15:01    Procedures Procedures (including  critical care time)  Medications Ordered in ED Medications - No data to display  ED Course  I have reviewed the triage vital signs and the nursing notes.  Pertinent labs & imaging results that were available during my care of the patient were reviewed by me and considered in my medical decision making (see chart for details).    MDM Rules/Calculators/A&P   With a past medical history of degenerative disc disease presents to the ED with complaints of back pain status post fall.  Reports this is mechanical in nature, she  is currently wearing a boot to her left foot after she has broken her left toe.  Reports she recently had an implant placed along her back, wanted to make sure the implant was in place.  No other injury, no other red flags such as saddle anesthesia, bowel or bladder complaints.  During evaluation she is overall nonill, nontoxic appearing.  Vitals are within normal limits, she is able to stand in the ED with a steady gait.  Good strength to bilateral lower extremities.  Mild tenderness to palpation along the lumbar spine but no bony tenderness noted.  X-ray of her back showed: Posterior fusion hardware at L4-5 is  new since the prior exam. Loss of disc space height L4-5 noted.   No other acute pathology.  She was provided with a copy of the xray for her records. These results were discussed with patient at length.  She has taken muscle relaxers for her back pain in the past.  Does not have a current prescription.  We discussed treatment with rice therapy along with over-the-counter medication to help with symptoms.  Patient understands and agrees with management.   Portions of this note were generated with [employer]. Dictation errors may occur despite best attempts at proofreading. ] Final Clinical Impression(s) / ED Diagnoses Final diagnoses:  Fall, initial encounter  Strain of lumbar region, initial encounter    Rx / DC Orders ED Discharge Orders    None        Janeece Fitting, PA-C 10/10/19 1542    Carmin Muskrat, MD 10/11/19 [phone]

## 2019-10-10 NOTE — ED Triage Notes (Signed)
Patient arrives to ED with complaints of a fall this afternoon. Patient was walking and tripped over her feet and fell on a concrete driveway. Patient left foot was broken back in May that caused fall. Patient complaints of lower back pain.

## 2019-10-10 NOTE — Discharge Instructions (Addendum)
Your x-ray today was within normal limits.  I have provided a copy of this image for you.  You may apply ice or heat to the area, for symptomatic control.  If you experience any saddle anesthesia, worsening symptoms please return to the emergency room.

## 2019-10-22 ENCOUNTER — Emergency Department (HOSPITAL_COMMUNITY)
Admission: EM | Admit: 2019-10-22 | Discharge: 2019-10-22 | Disposition: A | Discharge status: Home / Self Care | Payer: 59 | Attending: Emergency Medicine | Admitting: Emergency Medicine

## 2019-10-22 ENCOUNTER — Emergency Department (HOSPITAL_COMMUNITY): Payer: 59

## 2019-10-22 ENCOUNTER — Other Ambulatory Visit: Payer: Self-pay

## 2019-10-22 ENCOUNTER — Encounter (HOSPITAL_COMMUNITY): Payer: Self-pay | Admitting: Emergency Medicine

## 2019-10-22 DIAGNOSIS — D259 Leiomyoma of uterus, unspecified: Secondary | ICD-10-CM | POA: Insufficient documentation

## 2019-10-22 DIAGNOSIS — Z96612 Presence of left artificial shoulder joint: Secondary | ICD-10-CM | POA: Diagnosis not present

## 2019-10-22 DIAGNOSIS — R102 Pelvic and perineal pain: Secondary | ICD-10-CM

## 2019-10-22 DIAGNOSIS — Z96611 Presence of right artificial shoulder joint: Secondary | ICD-10-CM | POA: Insufficient documentation

## 2019-10-22 DIAGNOSIS — Z96652 Presence of left artificial knee joint: Secondary | ICD-10-CM | POA: Insufficient documentation

## 2019-10-22 DIAGNOSIS — Z79899 Other long term (current) drug therapy: Secondary | ICD-10-CM | POA: Insufficient documentation

## 2019-10-22 LAB — COMPREHENSIVE METABOLIC PANEL
ALT: 16 U/L (ref 0–44)
AST: 16 U/L (ref 15–41)
Albumin: 3.4 g/dL — ABNORMAL LOW (ref 3.5–5.0)
Alkaline Phosphatase: 100 U/L (ref 38–126)
Anion gap: 8 (ref 5–15)
BUN: 11 mg/dL (ref 6–20)
CO2: 27 mmol/L (ref 22–32)
Calcium: 9 mg/dL (ref 8.9–10.3)
Chloride: 107 mmol/L (ref 98–111)
Creatinine, Ser: 0.7 mg/dL (ref 0.44–1.00)
GFR calc Af Amer: >60 mL/min (ref >60)
GFR calc non Af Amer: >60 mL/min (ref >60)
Glucose, Bld: 111 mg/dL — ABNORMAL HIGH (ref 70–99)
Potassium: 4.3 mmol/L (ref 3.5–5.1)
Sodium: 142 mmol/L (ref 135–145)
Total Bilirubin: 0.5 mg/dL (ref 0.3–1.2)
Total Protein: 7 g/dL (ref 6.5–8.1)

## 2019-10-22 LAB — URINALYSIS, ROUTINE W REFLEX MICROSCOPIC
Bilirubin Urine: NEGATIVE
Glucose, UA: NEGATIVE mg/dL
Hgb urine dipstick: NEGATIVE
Ketones, ur: NEGATIVE mg/dL
Leukocytes,Ua: NEGATIVE
Nitrite: NEGATIVE
Protein, ur: NEGATIVE mg/dL
Specific Gravity, Urine: 1.031 — ABNORMAL HIGH (ref 1.005–1.030)
pH: 5 (ref 5.0–8.0)

## 2019-10-22 LAB — CBC
HCT: 40.7 % (ref 36.0–46.0)
Hemoglobin: 12.4 g/dL (ref 12.0–15.0)
MCH: 28.7 pg (ref 26.0–34.0)
MCHC: 30.5 g/dL (ref 30.0–36.0)
MCV: 94.2 fL (ref 80.0–100.0)
Platelets: 354 10*3/uL (ref 150–400)
RBC: 4.32 MIL/uL (ref 3.87–5.11)
RDW: 13.3 % (ref 11.5–15.5)
WBC: 8.5 10*3/uL (ref 4.0–10.5)
nRBC: 0 % (ref 0.0–0.2)

## 2019-10-22 LAB — LIPASE, BLOOD: Lipase: 21 U/L (ref 11–51)

## 2019-10-22 IMAGING — CT CT ABD-PELV W/ CM
2 of 5 series · 15 of 46 positions shown, 17 images · IV contrast (APPLIED)
Comparison: [DATE]
COMPARISON: [DATE]

Addendum:
CLINICAL DATA: Abdominal and pelvic pain for 1 week

EXAM:
CT ABDOMEN AND PELVIS WITH CONTRAST
TECHNIQUE: Multidetector CT imaging of the abdomen and pelvis was performed
using the standard protocol following bolus administration of
intravenous contrast. Sagittal and coronal MPR images reconstructed
from axial data set.
CONTRAST:  100mL OMNIPAQUE IOHEXOL 300 MG/ML SOLN IV. No oral
contrast administered.

[Series 3: abdomen 5.0 · axial · 0.94mm/px · z∈[-502,-102]mm · 12 of 94 slices shown, 14 images]
[im 7/94  soft-tissue]
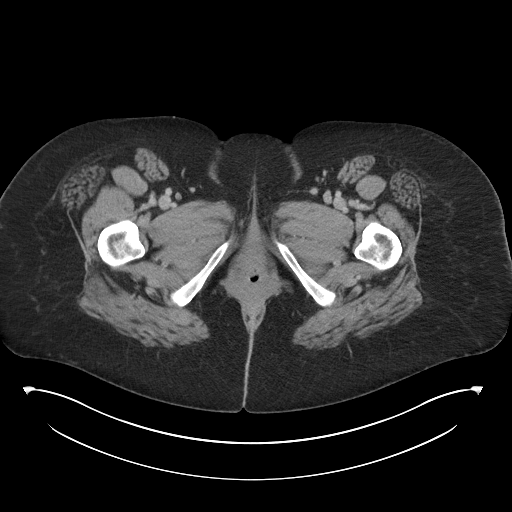
[im 7/94  bone]
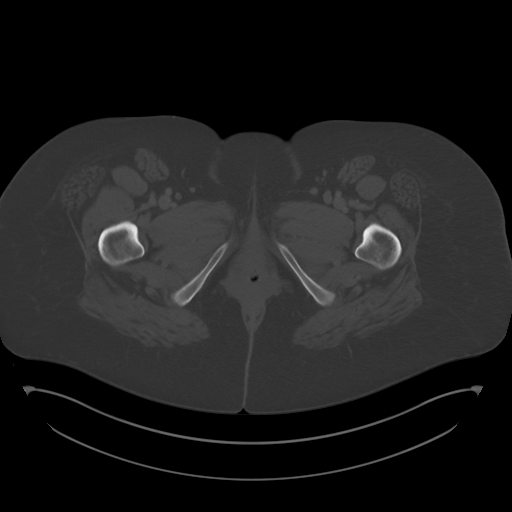
[im 13/94  soft-tissue]
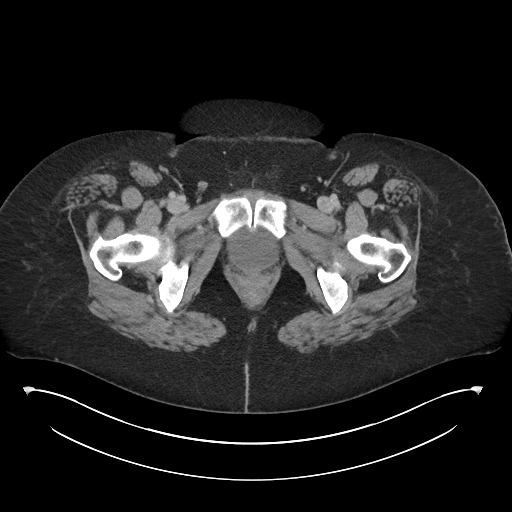
[im 19/94  soft-tissue]
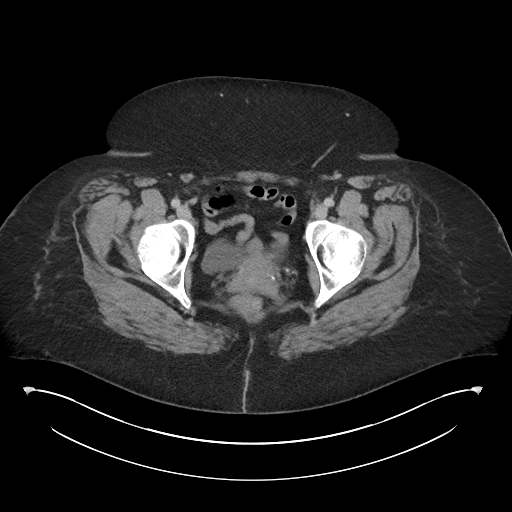
[im 32/94  soft-tissue]
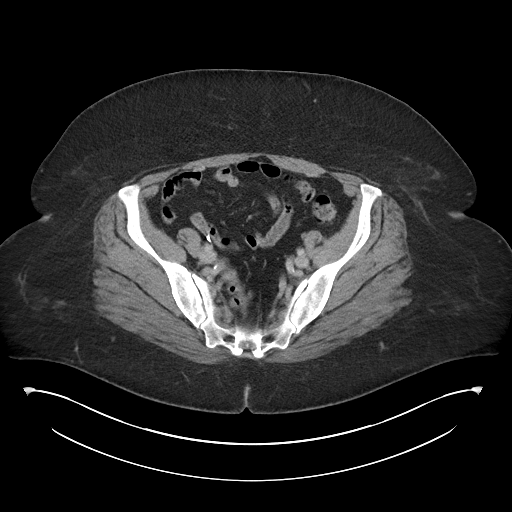
[im 38/94  soft-tissue]
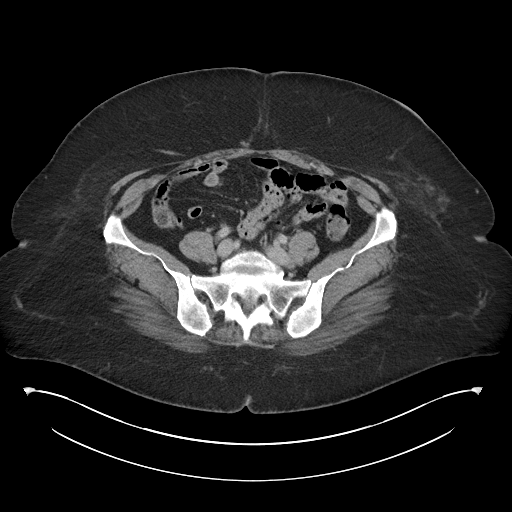
[im 44/94  soft-tissue]
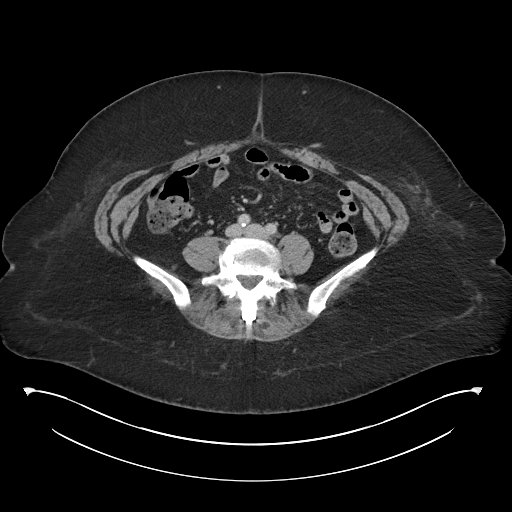
[im 50/94  soft-tissue]
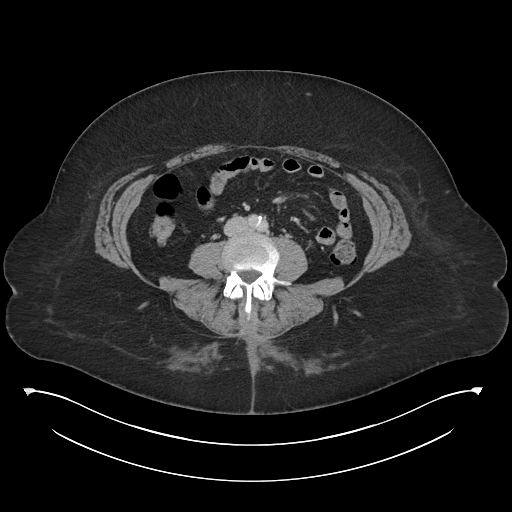
[im 56/94  soft-tissue]
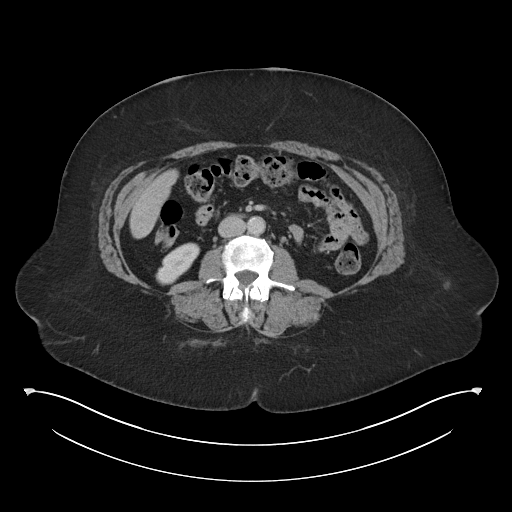
[im 63/94  soft-tissue]
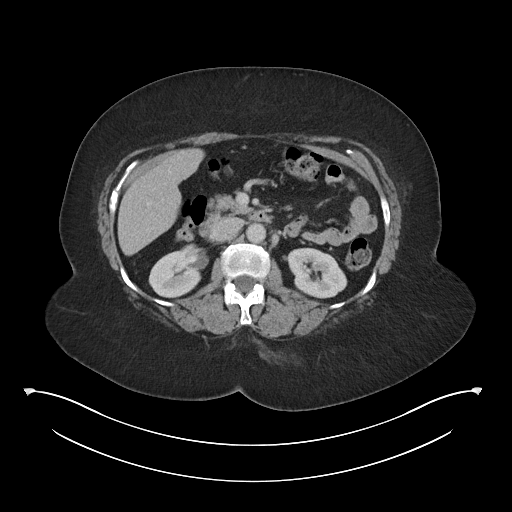
[im 63/94  bone]
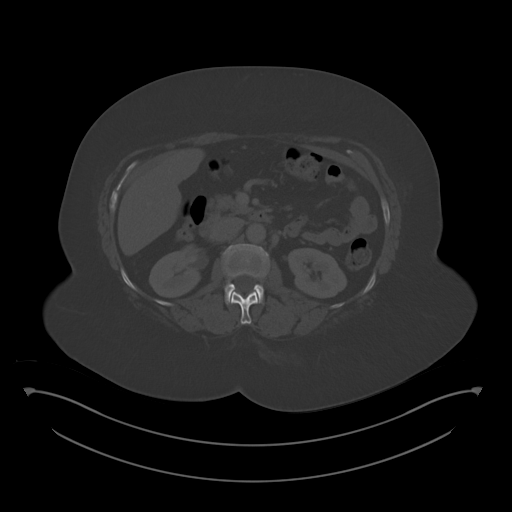
[im 75/94  soft-tissue]
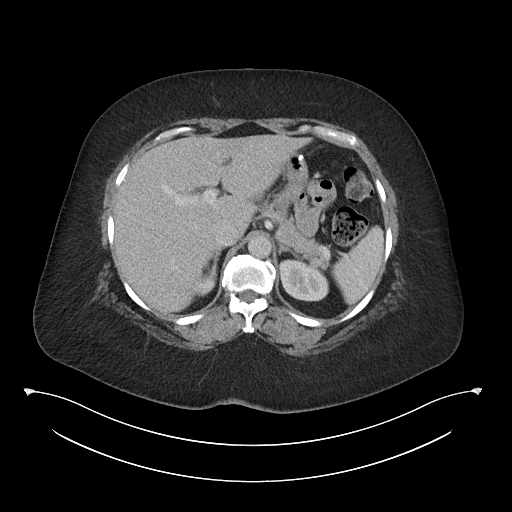
[im 81/94  soft-tissue]
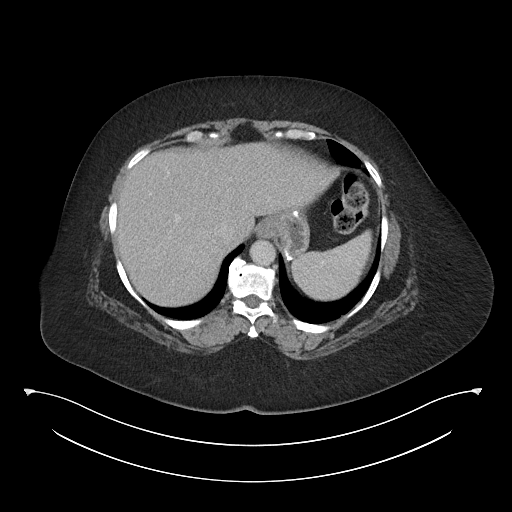
[im 87/94  soft-tissue]
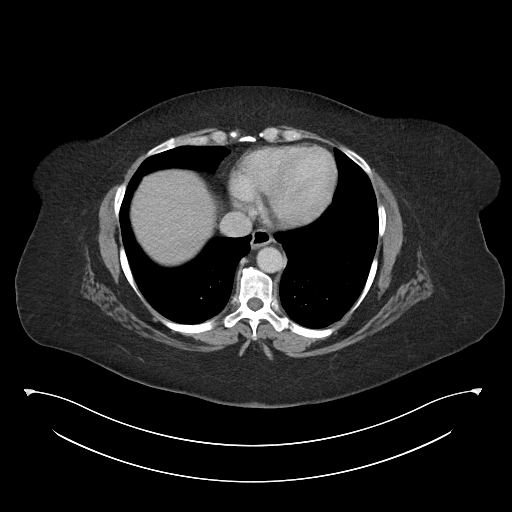

[Series 6: abdomen 3.0 mpr cor · coronal · 0.77mm/px · 3 of 110 slices shown]
[im 37/110  soft-tissue]
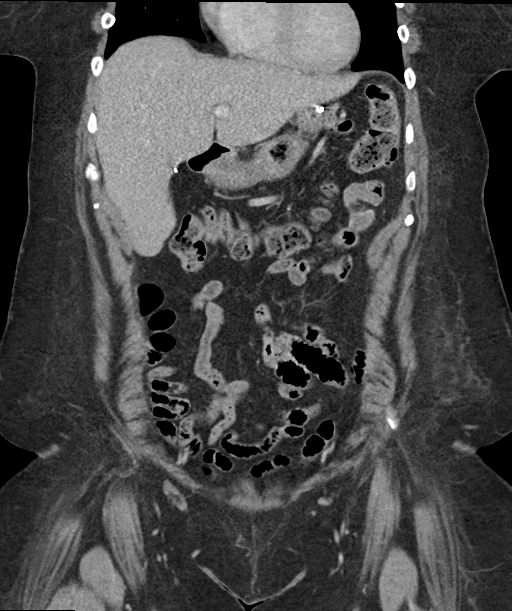
[im 49/110  soft-tissue]
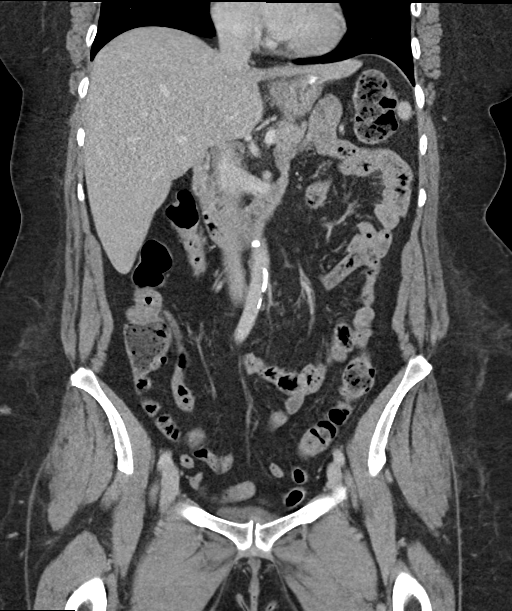
[im 61/110  soft-tissue]
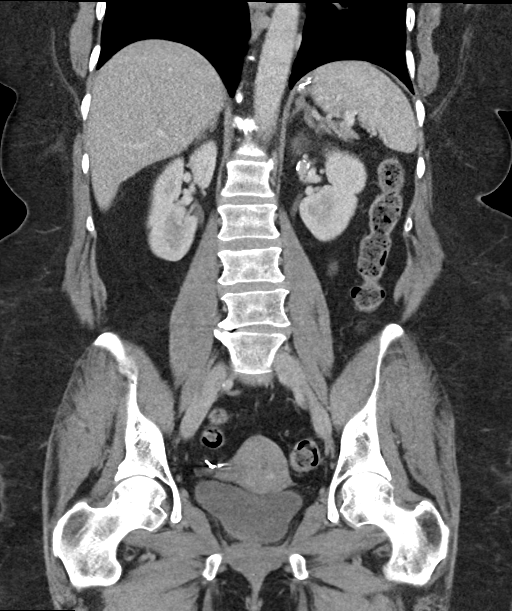

[15 of 46 positions shown; findings below may reference images not displayed]

FINDINGS: Lower chest: Minimal dependent subsegmental atelectasis LEFT lower
lobe

Hepatobiliary: Gallbladder surgically absent. Liver normal
appearance.

Pancreas: Normal appearance

Spleen: Normal appearance

Adrenals/Urinary Tract: Adrenal glands, kidneys, ureters, and
bladder normal appearance

Stomach/Bowel: Normal appendix. Minimal distal colonic
diverticulosis without evidence of diverticulitis. Prior gastric
stapling. Bowel loops otherwise unremarkable.

Vascular/Lymphatic: Atherosclerotic calcifications aorta and iliac
arteries without aneurysm. No adenopathy.

Reproductive: Nodular appearance of the uterus consistent with small
leiomyomata 1.9 x 1.5 cm anteriorly and 1.8 x 1.3 cm posteriorly.
Unremarkable adnexa.

Other: No free air free fluid. Tiny umbilical hernia containing fat.
No inflammatory process.

Musculoskeletal: Interspinous fusion posteriorly L4-L5. Bones
demineralized.
IMPRESSION: Minimal distal colonic diverticulosis without evidence of
diverticulitis.

Small uterine leiomyomata.

Tiny umbilical hernia containing fat.

No acute intra-abdominal or intrapelvic abnormalities.

Aortic Atherosclerosis ([07]-[07]).

ADDENDUM:
Discussed with Dr. JIM.

Patient indicates she has had a prior hysterectomy.

The soft tissue structure identified superior to the vaginal cuff
has the appearance of a uterus.

If patient has had a hysterectomy, then this could represent either
a retained cervix containing several nodules such as complex
nabothian cysts or cervical leiomyomata, or could represent an
ovarian mass.

This lesion appears separate from the bladder and bowel.

Further characterization of this lesion by transvaginal ultrasound
imaging recommended.

*** End of Addendum ***
FINDINGS: Lower chest: Minimal dependent subsegmental atelectasis LEFT lower
lobe

Hepatobiliary: Gallbladder surgically absent. Liver normal
appearance.

Pancreas: Normal appearance

Spleen: Normal appearance

Adrenals/Urinary Tract: Adrenal glands, kidneys, ureters, and
bladder normal appearance

Stomach/Bowel: Normal appendix. Minimal distal colonic
diverticulosis without evidence of diverticulitis. Prior gastric
stapling. Bowel loops otherwise unremarkable.

Vascular/Lymphatic: Atherosclerotic calcifications aorta and iliac
arteries without aneurysm. No adenopathy.

Reproductive: Nodular appearance of the uterus consistent with small
leiomyomata 1.9 x 1.5 cm anteriorly and 1.8 x 1.3 cm posteriorly.
Unremarkable adnexa.

Other: No free air free fluid. Tiny umbilical hernia containing fat.
No inflammatory process.

Musculoskeletal: Interspinous fusion posteriorly L4-L5. Bones
demineralized.
IMPRESSION: Minimal distal colonic diverticulosis without evidence of
diverticulitis.

Small uterine leiomyomata.

Tiny umbilical hernia containing fat.

No acute intra-abdominal or intrapelvic abnormalities.

Aortic Atherosclerosis ([07]-[07]).

## 2019-10-22 IMAGING — US US TRANSVAGINAL NON-OB
1 series · 14 of 25 positions shown · non-contrast
Comparison: None.

CLINICAL DATA: Mass seen on CT.  History of a hysterectomy.

EXAM:
ULTRASOUND PELVIS TRANSVAGINAL
TECHNIQUE: Transvaginal ultrasound examination of the pelvis was performed
including evaluation of the uterus, ovaries, adnexal regions, and
pelvic cul-de-sac.

[Series 1: us pelvis transvaginal non-ob (tv only) · 40 acquisitions, 14 frames shown]
[im 1/40]
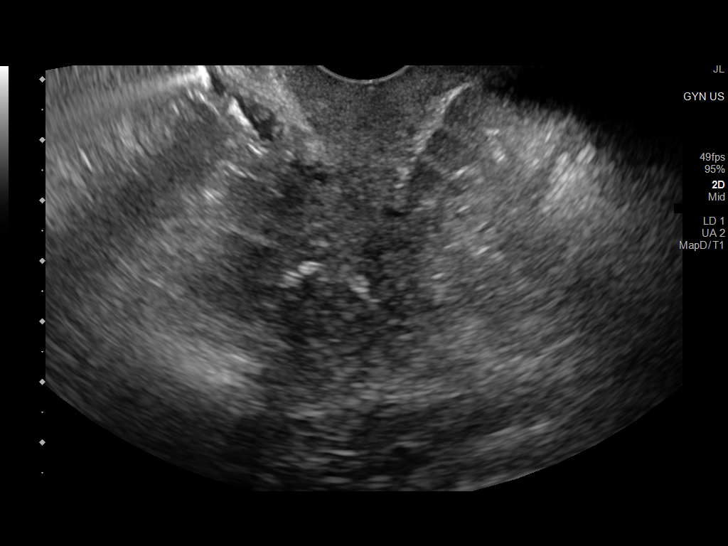
[im 4/40]
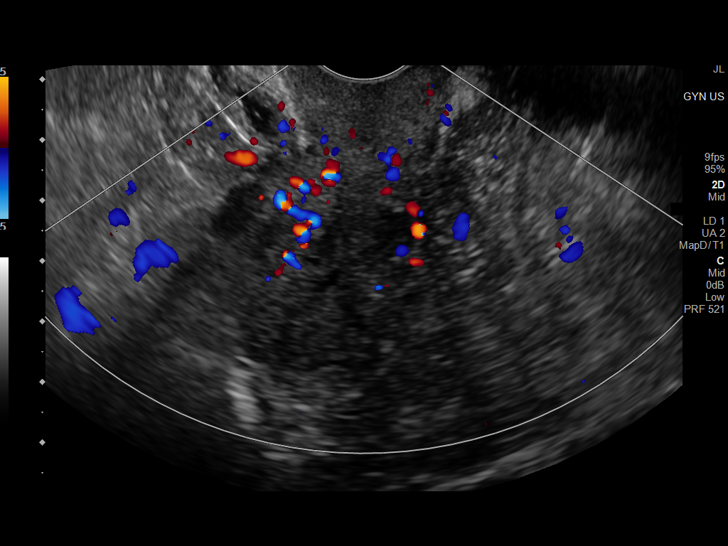
[im 7/40]
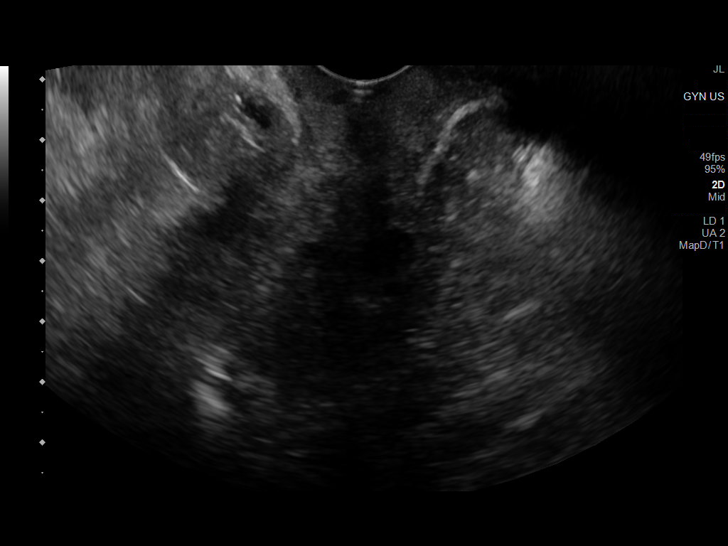
[im 10/40]
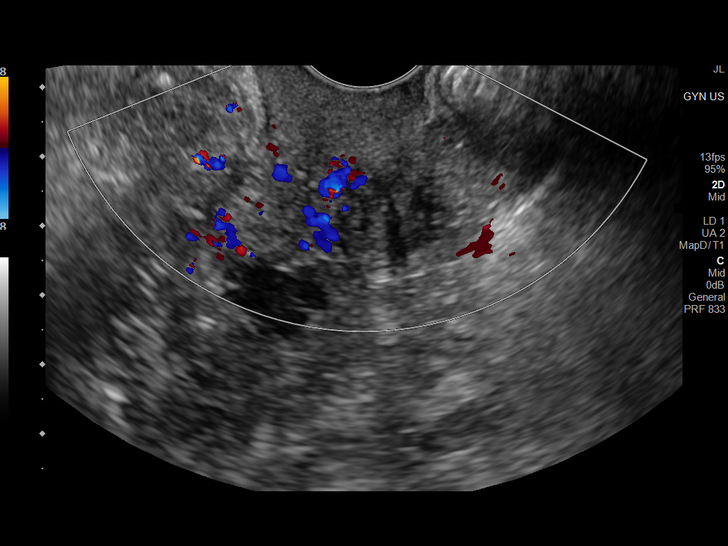
[im 14/40]
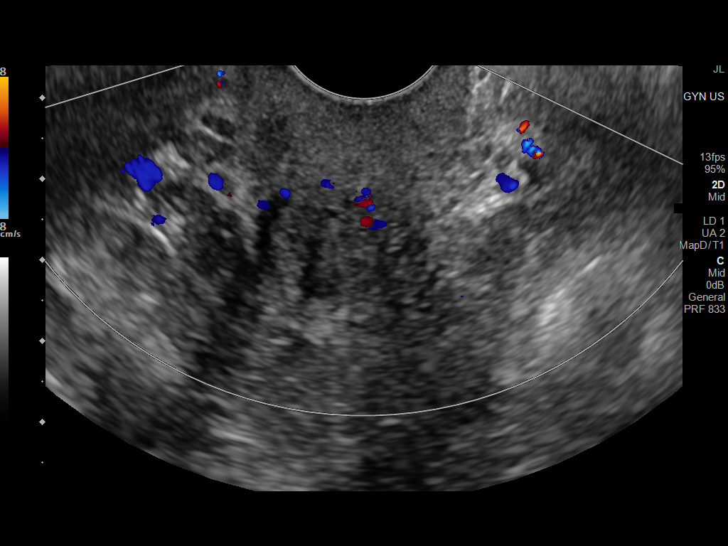
[im 15/40]
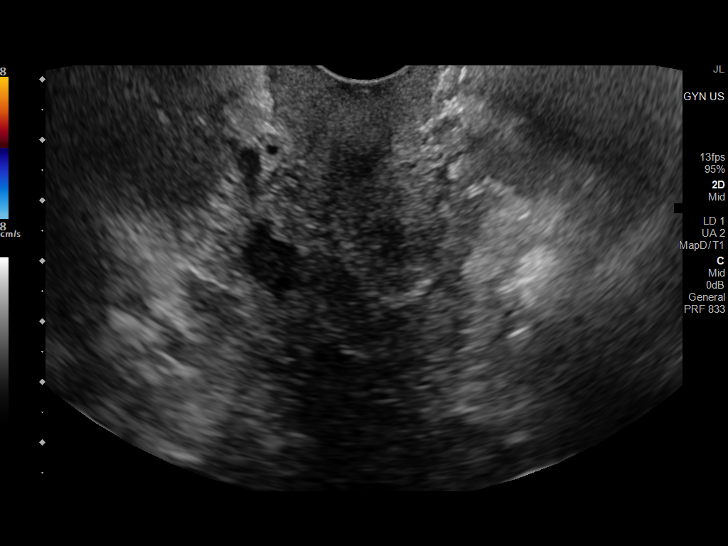
[im 18/40]
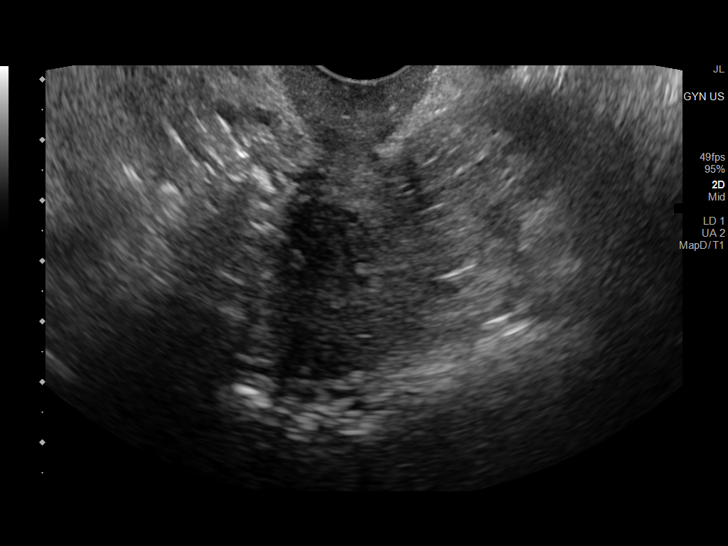
[im 22/40]
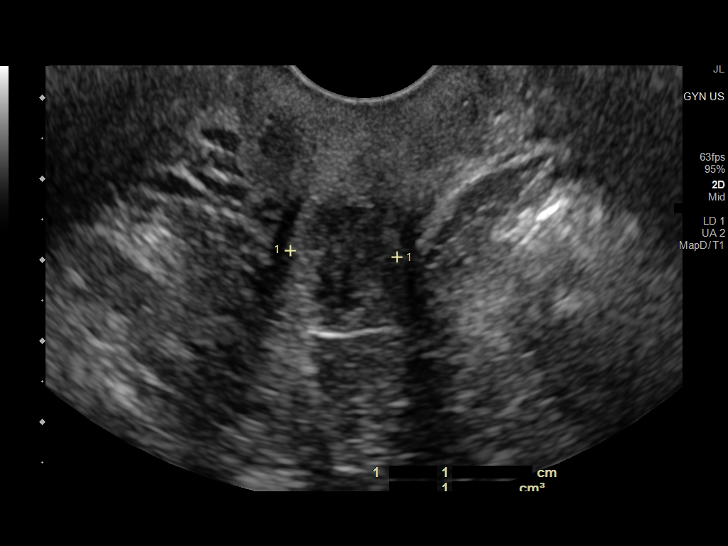
[im 25/40]
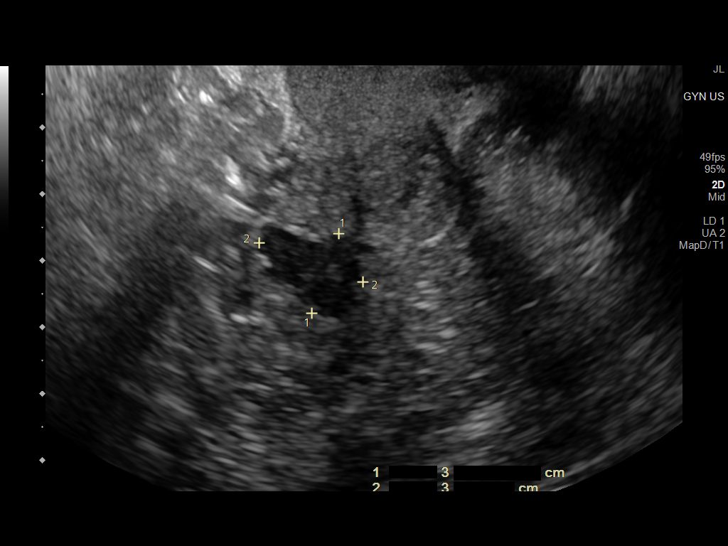
[im 27/40]
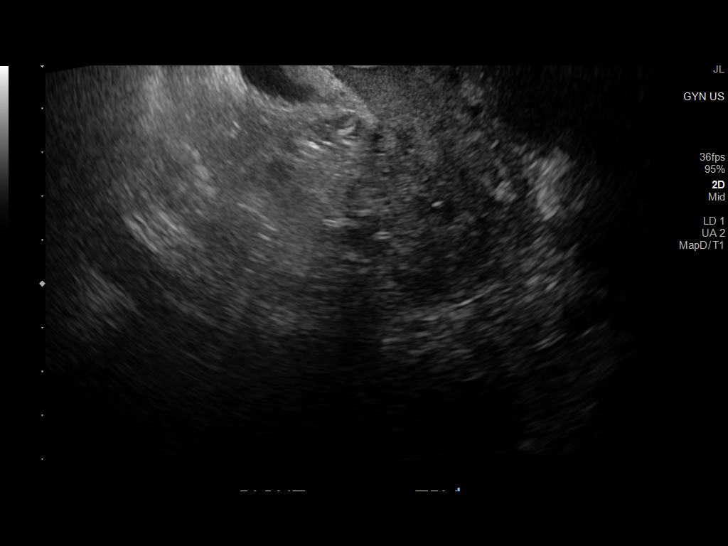
[im 30/40]
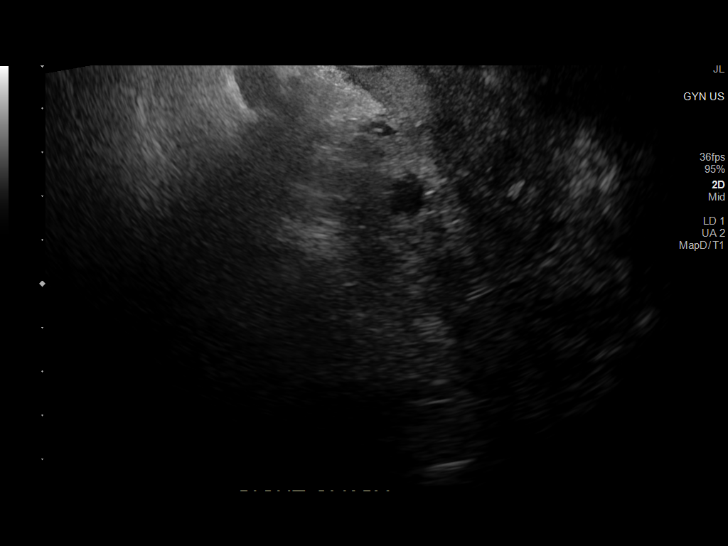
[im 33/40]
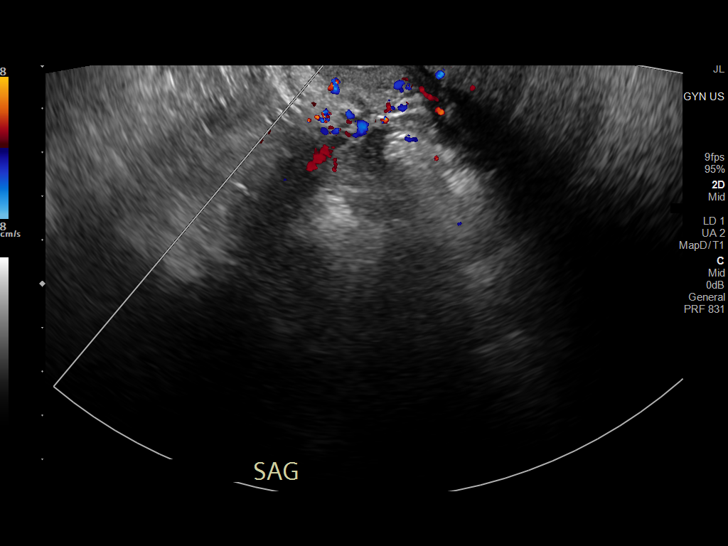
[im 36/40]
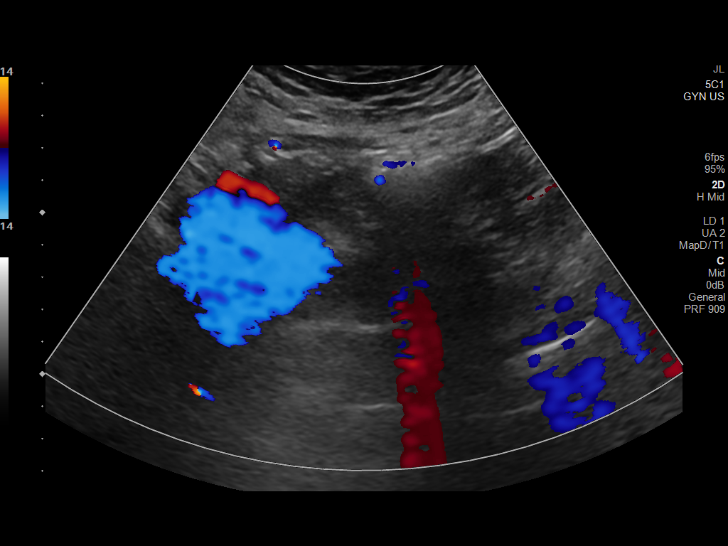
[im 40/40]
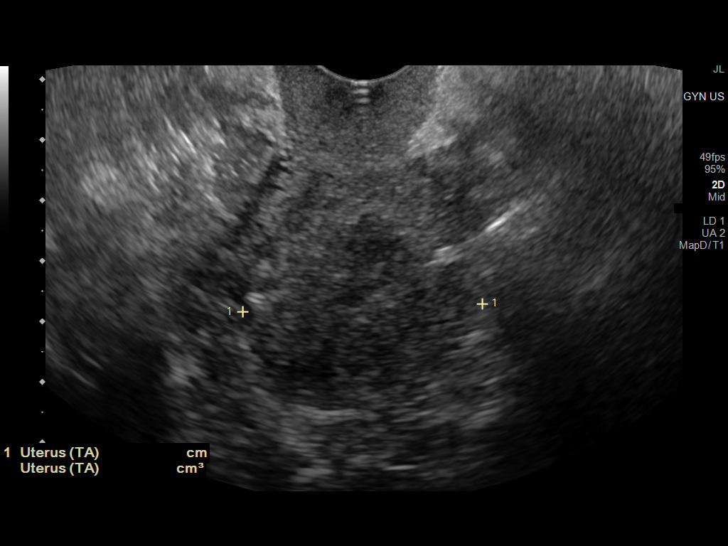

[14 of 25 positions shown; findings below may reference images not displayed]

FINDINGS: Uterus

Measurements: 5.1 x 3.2 x 4.0 cm = volume: There is 33.9 mL.
Heterogeneous echogenicity. Evidence of fibroids, 1 arising from the
anterior uterine segment, subserosal, 1.7 x 1.4 x 1.7 cm, 1
suggested from the cervix, lower uterine segment, 1.5 x 1.3 x
cm. Another from the right uterine body, mural, 1.3 x 1.7 x 1.3 cm.

Endometrium

Not well visualized.  No evidence of thickening.

Right ovary

Not visualized.  No adnexal masses.

Left ovary

Not visualized.  No adnexal masses

Other findings:  No abnormal free fluid
IMPRESSION: 1. Although history was provided stating a hysterectomy, there is a
uterus, as was noted on the current CT. There are small uterine
fibroids as detailed, also evident on the earlier CT.
2. No acute findings. Neither ovary visualized. No adnexal masses or
pelvic free fluid.

## 2019-10-22 MED ORDER — IOHEXOL 300 MG/ML  SOLN
100.0000 mL | Freq: Once | INTRAMUSCULAR | Status: AC | PRN
  Administered 2019-10-22: 100 mL via INTRAVENOUS

## 2019-10-22 MED ORDER — SODIUM CHLORIDE 0.9% FLUSH: 3.0000 mL | Freq: Once | INTRAVENOUS | Status: DC

## 2019-10-22 NOTE — ED Notes (Signed)
Patient verbalizes understanding of discharge instructions . Opportunity for questions and answers were provided . Armband removed by staff ,Pt discharged from ED. W/C  offered at D/C  and Declined W/C at D/C and was escorted to lobby by RN.  

## 2019-10-22 NOTE — ED Provider Notes (Signed)
[facility] DEPART[facility] Provider Note   CSN: [id number] Arrival date & time: 10/22/19  1006     History Chief Complaint  Patient presents with  . Pelvic Pain    Connie Hoffman is a 59 y.o. female.  HPI 59 yo female ho psoriatic arthritis presents with one week of suprapubic pain described as sharp and was coming and going now constant.  No uti sxs- nor fruquency, pain or inability to void.  BM normal.  Had similar sxs in the past but related to uterus and ovaries since removed- 2007, 2016.  No nausea, vomiting, diarrhea, fever, chills.  Pain is worse with movement, better with rest and heat. Tylenol 650 without relief. PMD Signe Colt, internal medicine    Past Medical History:  Diagnosis Date  . DDD (degenerative disc disease), lumbar   . Psoriatic arthritis ([place])   . Spinal stenosis     Patient Active Problem List   Diagnosis Date Noted  . DDD (degenerative disc disease), lumbar 01/21/2018  . Primary osteoarthritis of both hands 01/21/2018  . Primary osteoarthritis of both feet 01/21/2018  . Psoriasis 12/01/2017  . Psoriatic arthritis ([place]) 12/01/2017  . High risk medication use 12/01/2017  . 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 Thrombocytosis ([place]) 12/01/2017  . 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    Past Surgical History:  Procedure Laterality Date  . ABDOMINAL HYSTERECTOMY    . BACK SURGERY  05/18/2019   L4 and L5  . CHOLECYSTECTOMY    . CYSTECTOMY  07/2017  . JOINT REPLACEMENT Left    left knee  . [place] RESECTION  2018  . LUMBAR EPIDURAL INJECTION  04/13/2018  . REVISION TOTAL KNEE ARTHROPLASTY Left 12/2017  . TOTAL SHOULDER ARTHROPLASTY Bilateral      OB History   No obstetric history on file.     Family History  Problem Relation Age of Onset  . Diabetes Mother   . Hypertension Mother   . Heart Problems Mother   . Congestive Heart Failure Mother   . Diabetes Father     . Hypertension Father   . Stroke Sister   . Heart Problems Sister   . Prostate cancer Brother   . Diabetes Brother   . Asthma Daughter   . Healthy Daughter   . Healthy Son   . Healthy Son     Social History   Tobacco Use  . Smoking status: Never Smoker  . Smokeless tobacco: Never Used  Vaping Use  . Vaping Use: Never used  Substance Use Topics  . Alcohol use: Never  . Drug use: Never    Home Medications Prior to Admission medications   Medication Sig Start Date End Date Taking? Authorizing Provider  diphenhydramine-acetaminophen (TYLENOL PM) 25-500 MG TABS tablet Take 1 tablet by mouth at bedtime as needed.    [provider]  folic acid (FOLVITE) 1 MG tablet Take 1 mg by mouth daily.    [provider]  ketoconazole (NIZORAL) 2 % cream Apply 1 application topically daily. 07/27/19   Ofilia Neas, PA-C  Menthol, Topical Analgesic, (BIOFREEZE) 4 % GEL Apply 1 application topically See admin instructions. Apply to painful sites as directed    [provider]  methotrexate 2.5 MG tablet TAKE 5 TABLETS BY MOUTH ONCE A WEEK. PROTECT FROM LIGHT. Patient taking differently: Take 12.5 mg by mouth once a week.  07/06/19   Bo Merino, MD  Multiple Vitamin (MULTIVITAMIN WITH MINERALS) TABS  tablet Take 1 tablet by mouth daily.    [provider]  Secukinumab, 300 MG Dose, (COSENTYX SENSOREADY, 300 MG,) 150 MG/ML SOAJ Inject 300 mg into the skin every 28 (twenty-eight) days. 08/17/19   Bo Merino, MD  triamcinolone ointment (KENALOG) 0.1 % Apply 1 application topically 2 (two) times daily as needed (for itching). Prn  10/05/17   [provider]    Allergies    Aspirin  Review of Systems   Review of Systems  Constitutional: Positive for activity change, appetite change, chills, diaphoresis, fatigue, fever and unexpected weight change.  HENT: Negative.   Eyes: Negative.   Respiratory: Positive for cough.   Cardiovascular:  Negative.   Gastrointestinal: Negative.   Endocrine: Negative.   Genitourinary: Negative.   Musculoskeletal: Positive for back pain.  Skin: Positive for rash.       Followed by derm no changes  Allergic/Immunologic: Positive for immunocompromised state.  Neurological: Negative.   Hematological: Negative.   Psychiatric/Behavioral: Negative.   All other systems reviewed and are negative.   Physical Exam Updated Vital Signs BP (!) 142/66 (BP Location: Right Arm)   Pulse 71   Temp 98.2 F (36.8 C) (Oral)   Resp 16   Ht 1.651 m (5\' 5" )   Wt 111.1 kg   SpO2 100%   BMI 40.77 kg/m   Physical Exam Vitals and nursing note reviewed.  Constitutional:      Appearance: She is well-developed.  HENT:     Head: Normocephalic and atraumatic.     Right Ear: External ear normal.     Left Ear: External ear normal.     Nose: Nose normal.  Eyes:     Conjunctiva/sclera: Conjunctivae normal.     Pupils: Pupils are equal, round, and reactive to light.  Neck:     Thyroid: No thyromegaly.     Vascular: No JVD.     Trachea: No tracheal deviation.  Cardiovascular:     Rate and Rhythm: Normal rate and regular rhythm.     Heart sounds: Normal heart sounds.  Pulmonary:     Effort: Pulmonary effort is normal.     Breath sounds: Normal breath sounds. No wheezing.  Abdominal:     General: Bowel sounds are normal.     Palpations: Abdomen is soft. There is no mass.     Tenderness: There is abdominal tenderness. There is no guarding.     Comments: Suprapubic tenderness noted on palpation no left lower quadrant or right lower quadrant tenderness. No definitive mass palpated  Musculoskeletal:        General: Normal range of motion.     Cervical back: Normal range of motion and neck supple.  Lymphadenopathy:     Cervical: No cervical adenopathy.  Skin:    General: Skin is warm and dry.  Neurological:     Mental Status: She is alert and oriented to person, place, and time.     GCS: GCS eye  subscore is 4. GCS verbal subscore is 5. GCS motor subscore is 6.     Cranial Nerves: No cranial nerve deficit.     Sensory: No sensory deficit.     Gait: Gait normal.     Deep Tendon Reflexes: Reflexes are normal and symmetric. Babinski sign absent on the right side. Babinski sign absent on the left side.     Reflex Scores:      Bicep reflexes are 2+ on the right side and 2+ on the left side.  Patellar reflexes are 2+ on the right side and 2+ on the left side.    Comments: Strength is normal and equal throughout. Cranial nerves grossly intact. Patient fluent. No gross ataxia and patient able to ambulate without difficulty.  Psychiatric:        Behavior: Behavior normal.        Thought Content: Thought content normal.        Judgment: Judgment normal.     ED Results / Procedures / Treatments   Labs (all labs ordered are listed, but only abnormal results are displayed) Labs Reviewed  CBC  LIPASE, BLOOD  COMPREHENSIVE METABOLIC PANEL  URINALYSIS, ROUTINE W REFLEX MICROSCOPIC    EKG None  Radiology US PELVIS TRANSVAGINAL NON-OB (TV ONLY)  Result Date: 10/22/2019 CLINICAL DATA:  Mass seen on CT.  History of a hysterectomy. EXAM: ULTRASOUND PELVIS TRANSVAGINAL TECHNIQUE: Transvaginal ultrasound examination of the pelvis was performed including evaluation of the uterus, ovaries, adnexal regions, and pelvic cul-de-sac. COMPARISON:  None. FINDINGS: Uterus Measurements: 5.1 x 3.2 x 4.0 cm = volume: There is 33.9 mL. Heterogeneous echogenicity. Evidence of fibroids, 1 arising from the anterior uterine segment, subserosal, 1.7 x 1.4 x 1.7 cm, 1 suggested from the cervix, lower uterine segment, 1.5 x 1.3 x 1.3 cm. Another from the right uterine body, mural, 1.3 x 1.7 x 1.3 cm. Endometrium Not well visualized.  No evidence of thickening. Right ovary Not visualized.  No adnexal masses. Left ovary Not visualized.  No adnexal masses Other findings:  No abnormal free fluid IMPRESSION: 1.  Although history was provided stating a hysterectomy, there is a uterus, as was noted on the current CT. There are small uterine fibroids as detailed, also evident on the earlier CT. 2. No acute findings. Neither ovary visualized. No adnexal masses or pelvic free fluid. Electronically Signed   By: Lajean Manes M.D.   On: 10/22/2019 15:24   CT ABDOMEN PELVIS W CONTRAST  Addendum Date: 10/22/2019   ADDENDUM REPORT: 10/22/2019 14:14 ADDENDUM: Discussed with Dr. Jeanell Sparrow. Patient indicates she has had a prior hysterectomy. The soft tissue structure identified superior to the vaginal cuff has the appearance of a uterus. If patient has had a hysterectomy, then this could represent either a retained cervix containing several nodules such as complex nabothian cysts or cervical leiomyomata, or could represent an ovarian mass. This lesion appears separate from the bladder and bowel. Further characterization of this lesion by transvaginal ultrasound imaging recommended. Electronically Signed   By: Lavonia Dana M.D.   On: 10/22/2019 14:14   Result Date: 10/22/2019 CLINICAL DATA:  Abdominal and pelvic pain for 1 week EXAM: CT ABDOMEN AND PELVIS WITH CONTRAST TECHNIQUE: Multidetector CT imaging of the abdomen and pelvis was performed using the standard protocol following bolus administration of intravenous contrast. Sagittal and coronal MPR images reconstructed from axial data set. CONTRAST:  113mL OMNIPAQUE IOHEXOL 300 MG/ML SOLN IV. No oral contrast administered. COMPARISON:  10/13/2017 FINDINGS: Lower chest: Minimal dependent subsegmental atelectasis LEFT lower lobe Hepatobiliary: Gallbladder surgically absent. Liver normal appearance. Pancreas: Normal appearance Spleen: Normal appearance Adrenals/Urinary Tract: Adrenal glands, kidneys, ureters, and bladder normal appearance Stomach/Bowel: Normal appendix. Minimal distal colonic diverticulosis without evidence of diverticulitis. Prior gastric stapling. Bowel loops otherwise  unremarkable. Vascular/Lymphatic: Atherosclerotic calcifications aorta and iliac arteries without aneurysm. No adenopathy. Reproductive: Nodular appearance of the uterus consistent with small leiomyomata 1.9 x 1.5 cm anteriorly and 1.8 x 1.3 cm posteriorly. Unremarkable adnexa. Other: No free air free fluid. Tiny umbilical hernia  containing fat. No inflammatory process. Musculoskeletal: Interspinous fusion posteriorly L4-L5. Bones demineralized. IMPRESSION: Minimal distal colonic diverticulosis without evidence of diverticulitis. Small uterine leiomyomata. Tiny umbilical hernia containing fat. No acute intra-abdominal or intrapelvic abnormalities. Aortic Atherosclerosis (ICD10-I70.0). Electronically Signed: By: Lavonia Dana M.D. On: 10/22/2019 13:46    Procedures Procedures (including critical care time)  Medications Ordered in ED Medications  sodium chloride flush (NS) 0.9 % injection 3 mL (has no administration in time range)    ED Course  I have reviewed the triage vital signs and the nursing notes.  Pertinent labs & imaging results that were available during my care of the patient were reviewed by me and considered in my medical decision making (see chart for details).   CT results and images reviewed.  Discussed with Dr. Thornton Papas.  Patient with history of hysterectomy.  He has identified uterus on the CT scan.  Patient had abdominal CT and may have had cervix remaining.  This could be mass from cervix or adnexal.  In discussion with Dr. Thornton Papas, plan ultrasound I discussed with patient and she voices understanding Discussed with Dr. Harolyn Rutherford and plan referral to [facility] [place] [facility]                         Patient with report of hysterectomy presents today for pain.  CT obtained and shows uterine fibroids.  Subsequently, ultrasound obtained.  This too is consistent with fibroid uterus.  Plan referral to GYN.  Final Clinical Impression(s) / ED  Diagnoses Final diagnoses:  Pelvic pain in female  Uterine leiomyoma, unspecified location    Rx / DC Orders ED Discharge Orders    None       Pattricia Boss, MD 10/22/19 1616

## 2019-10-22 NOTE — ED Notes (Signed)
Pt transported to US

## 2019-10-22 NOTE — ED Triage Notes (Signed)
Reports pelvic pain x 1 week.  States it felt like something was going to "fall out" while using the bathroom this morning.

## 2019-10-22 NOTE — Discharge Instructions (Addendum)
Please call hospital about hysterectomy order form and aspirin to [facility]  Their fax number should be on your phone. Please call tomorrow to have follow-up appointment made.

## 2019-11-08 ENCOUNTER — Other Ambulatory Visit: Payer: Self-pay | Admitting: Rheumatology

## 2019-11-08 NOTE — Telephone Encounter (Signed)
Last Visit: 07/27/2019 Next Visit: 01/05/2020 Labs: 10/22/2019 Glucose 111, Albumin 3.4  Current Dose per office note 07/27/2019: methotrexate 5 tablets every 7 days DX:  Psoriatic arthritis   Okay to refill MTX?

## 2019-11-16 ENCOUNTER — Other Ambulatory Visit: Payer: Self-pay | Admitting: Rheumatology

## 2019-11-16 DIAGNOSIS — L409 Psoriasis, unspecified: Secondary | ICD-10-CM

## 2019-11-16 DIAGNOSIS — L4050 Arthropathic psoriasis, unspecified: Secondary | ICD-10-CM

## 2019-11-16 NOTE — Telephone Encounter (Signed)
Last Visit: 07/27/2019 Next Visit: 01/05/2020 Labs: 10/22/2019 Glucose 111, Albumin 3.4 TB Gold: 05/30/2019 Negative  Current Dose per office note on 07/27/2019: Cosentyx 300 mg every 28 days Dx:  Psoriatic arthritis   Okay to refill Cosentyx?

## 2019-12-04 ENCOUNTER — Other Ambulatory Visit: Payer: Self-pay

## 2019-12-04 ENCOUNTER — Other Ambulatory Visit (HOSPITAL_COMMUNITY)
Admission: RE | Admit: 2019-12-04 | Discharge: 2019-12-04 | Disposition: A | Discharge status: Home / Self Care | Payer: 59 | Source: Ambulatory Visit | Attending: Obstetrics and Gynecology | Admitting: Obstetrics and Gynecology

## 2019-12-04 ENCOUNTER — Encounter: Payer: Self-pay | Admitting: Obstetrics and Gynecology

## 2019-12-04 ENCOUNTER — Ambulatory Visit (INDEPENDENT_AMBULATORY_CARE_PROVIDER_SITE_OTHER): Payer: 59 | Admitting: Obstetrics and Gynecology

## 2019-12-04 VITALS — BP 112/76 | HR 86 | Ht 65.0 in | Wt 259.3 lb

## 2019-12-04 DIAGNOSIS — R1900 Intra-abdominal and pelvic swelling, mass and lump, unspecified site: Secondary | ICD-10-CM | POA: Diagnosis not present

## 2019-12-04 DIAGNOSIS — Z1151 Encounter for screening for human papillomavirus (HPV): Secondary | ICD-10-CM | POA: Diagnosis not present

## 2019-12-04 DIAGNOSIS — Z124 Encounter for screening for malignant neoplasm of cervix: Secondary | ICD-10-CM | POA: Diagnosis not present

## 2019-12-04 NOTE — Progress Notes (Signed)
Obstetrics and Gynecology New Patient Evaluation  Appointment Date: 12/04/2019  OBGYN Clinic: [facility]  Primary Care Provider: Jenel Lucks  Referring Provider: Burna Cash*  Chief Complaint: abdominal pain, h/o hysterectomy, pelvic masses seen on imaging  History of Present Illness: Connie Hoffman is a 59 y.o. African-American G3P3 (No LMP recorded. Patient has had a hysterectomy.), seen for the above chief complaint. Her past medical history is significant for BMI 40s, h/o hysterectomy, h/o endometriosis, h/o gastric bypass.   Patient went to ED on 6/27 for several week h/o lower belly pain. She states it feels similar to prior pains she had before her hysterectomy in [place] in 2014. In the ED, CT showed what looked to be 2cm small fibroids in what appeared to be a uterus. F/u u/s showed similar findings or from the cervix; see reports below. Cmp, u/a, cbc, lipase were negative. GYN consulted and recommended outpatient follow up  Surgical history is as follows: patient had a ppBTL. She then had endometriosis seen on laparoscopy and what sounds like fulguration in 2011, then she had pain similar to current pain now and had l/s to remove her right ovary because she states it was very large, but this didn't help so she had a l/s hysterectomy which she thought they removed her cervix too. She believes her left ovary was left and she doesn't recall being told she had to have pap smears in the future. This and her right oophorectomy were done in 2014 at [facility] in [place], [place]  Patient states s/s are similar. Never any vaginal bleeding or discharge. No SUI s/s.   Review of Systems: Pertinent items are noted in HPI.    Past Medical History:  Past Medical History:  Diagnosis Date  . DDD (degenerative disc disease), lumbar   . Psoriatic arthritis ([place])   . Spinal stenosis     Past Surgical History:  Past Surgical  History:  Procedure Laterality Date  . BACK SURGERY  05/18/2019   L4 and L5  . CHOLECYSTECTOMY    . CYSTECTOMY  07/2017  . JOINT REPLACEMENT Left    left knee  . LAPAROSCOPIC ENDOMETRIOSIS FULGURATION  2011  . [place] RESECTION  2018  . LAPAROSCOPIC HYSTERECTOMY  2014  . LAPAROSCOPIC OOPHERECTOMY Right   . LUMBAR EPIDURAL INJECTION  04/13/2018  . REVISION TOTAL KNEE ARTHROPLASTY Left 12/2017  . TOTAL SHOULDER ARTHROPLASTY Bilateral   . TUBAL LIGATION     postpartum    Past Obstetrical History:  OB History  Gravida Para Term Preterm AB Living  3         3  SAB TAB Ectopic Multiple Live Births          3    # Outcome Date GA Lbr Len/2nd Weight Sex Delivery Anes PTL Lv  3 Gravida           2 Gravida           1 Gravida             Obstetric Comments  Vaginal deliveries x 3.     Past Gynecological History: As per HPI. History of Pap Smear(s): unknown History of HRT use: No.  Social History:  Social History   Socioeconomic History  . Marital status: Married    Spouse name: Not on file  . Number of children: Not on file  . Years of education: Not on file  . Highest education level: Not on  file  Occupational History  . Not on file  Tobacco Use  . Smoking status: Never Smoker  . Smokeless tobacco: Never Used  Vaping Use  . Vaping Use: Never used  Substance and Sexual Activity  . Alcohol use: Never  . Drug use: Never  . Sexual activity: Not on file  Other Topics Concern  . Not on file  Social History Narrative  . Not on file   Social Determinants of Health   Financial Resource Strain:   . Difficulty of Paying Living Expenses:   Food Insecurity: No Food Insecurity  . Worried About Charity fundraiser in the Last Year: Never true  . Ran Out of Food in the Last Year: Never true  Transportation Needs: No Transportation Needs  . Lack of Transportation (Medical): No  . Lack of Transportation (Non-Medical): No  Physical Activity:   . Days  of Exercise per Week:   . Minutes of Exercise per Session:   Stress:   . Feeling of Stress :   Social Connections:   . Frequency of Communication with Friends and Family:   . Frequency of Social Gatherings with Friends and Family:   . Attends Religious Services:   . Active Member of Clubs or Organizations:   . Attends Archivist Meetings:   Marland Kitchen Marital Status:   Intimate Partner Violence:   . Fear of Current or Ex-Partner:   . Emotionally Abused:   Marland Kitchen Physically Abused:   . Sexually Abused:     Family History:  Family History  Problem Relation Age of Onset  . Diabetes Mother   . Hypertension Mother   . Heart Problems Mother   . Congestive Heart Failure Mother   . Diabetes Father   . Hypertension Father   . Stroke Sister   . Heart Problems Sister   . Prostate cancer Brother   . Diabetes Brother   . Asthma Daughter   . Healthy Daughter   . Healthy Son   . Healthy Son     Medications Neddie Kutter had no medications administered during this visit. Current Outpatient Medications  Medication Sig Dispense Refill  . COSENTYX SENSOREADY, 300 MG, 150 MG/ML SOAJ INJECT 300MG  SUBCUTANEOUSLY EVERY 4 WEEKS 6 mL 0  . diphenhydramine-acetaminophen (TYLENOL PM) 25-500 MG TABS tablet Take 1 tablet by mouth at bedtime as needed.    . DULoxetine (CYMBALTA) 30 MG capsule Take 30 mg by mouth daily.    . folic acid (FOLVITE) 1 MG tablet Take 1 mg by mouth daily.    Marland Kitchen gabapentin (NEURONTIN) 100 MG capsule Take 100 mg by mouth 3 (three) times daily.    Marland Kitchen ketoconazole (NIZORAL) 2 % cream Apply 1 application topically daily. 15 g 0  . Menthol, Topical Analgesic, (BIOFREEZE) 4 % GEL Apply 1 application topically See admin instructions. Apply to painful sites as directed    . methotrexate (RHEUMATREX) 2.5 MG tablet TAKE 5 TABLETS BY MOUTH ONCE A WEEK. PROTECT FROM LIGHT. 60 tablet 0  . Multiple Vitamin (MULTIVITAMIN WITH MINERALS) TABS tablet Take 1 tablet by mouth daily.    Marland Kitchen  triamcinolone ointment (KENALOG) 0.1 % Apply 1 application topically 2 (two) times daily as needed (for itching). Prn   0   No current facility-administered medications for this visit.    Allergies Aspirin   Physical Exam:  BP 112/76   Pulse 86   Ht 5\' 5"  (1.651 m)   Wt 259 lb 4.8 oz (117.6 kg)   BMI 43.15  kg/m  Body mass index is 43.15 kg/m. General appearance: Well nourished, well developed female in no acute distress.  Cardiovascular: normal s1 and s2.  No murmurs, rubs or gallops. Respiratory:  Clear to auscultation bilateral. Normal respiratory effort Abdomen: obese, nttp, nd. Multiple l/s well healed port sites on her abdomen. No low transverse incisions seen below pannus Neuro/Psych:  Normal mood and affect.  Skin:  Warm and dry.  Lymphatic:  No inguinal lymphadenopathy.   Pelvic exam: is limited by body habitus EGBUS: within normal limits Vagina: within normal limits and with no blood or discharge in the vault. Mild to moderate anterior vag wall prolapse with valsalva (o -2) Cervix: SEEN. normal appearing cervix without tenderness, discharge or lesions.  No suprapubic tenderness or masses felt Adnexa:  normal adnexa and no mass, fullness, tenderness Rectovaginal: deferred  Laboratory: as per HPI.   Radiology:  Narrative & Impression  CLINICAL DATA:  Mass seen on CT.  History of a hysterectomy.  EXAM: ULTRASOUND PELVIS TRANSVAGINAL  TECHNIQUE: Transvaginal ultrasound examination of the pelvis was performed including evaluation of the uterus, ovaries, adnexal regions, and pelvic cul-de-sac.  COMPARISON:  None.  FINDINGS: Uterus  Measurements: 5.1 x 3.2 x 4.0 cm = volume: There is 33.9 mL. Heterogeneous echogenicity. Evidence of fibroids, 1 arising from the anterior uterine segment, subserosal, 1.7 x 1.4 x 1.7 cm, 1 suggested from the cervix, lower uterine segment, 1.5 x 1.3 x 1.3 cm. Another from the right uterine body, mural, 1.3 x 1.7 x 1.3  cm.  Endometrium  Not well visualized.  No evidence of thickening.  Right ovary  Not visualized.  No adnexal masses.  Left ovary  Not visualized.  No adnexal masses  Other findings:  No abnormal free fluid  IMPRESSION: 1. Although history was provided stating a hysterectomy, there is a uterus, as was noted on the current CT. There are small uterine fibroids as detailed, also evident on the earlier CT. 2. No acute findings. Neither ovary visualized. No adnexal masses or pelvic free fluid.   Electronically Signed   By: Lajean Manes M.D.   On: 10/22/2019 15:24   ADDENDUM REPORT: 10/22/2019 14:14  ADDENDUM: Discussed with Dr. Jeanell Sparrow.  Patient indicates she has had a prior hysterectomy.  The soft tissue structure identified superior to the vaginal cuff has the appearance of a uterus.  If patient has had a hysterectomy, then this could represent either a retained cervix containing several nodules such as complex nabothian cysts or cervical leiomyomata, or could represent an ovarian mass.  This lesion appears separate from the bladder and bowel.  Further characterization of this lesion by transvaginal ultrasound imaging recommended.   Electronically Signed   By: Lavonia Dana M.D.   On: 10/22/2019 14:14  Addenda  ADDENDUM REPORT: 10/22/2019 14:14  ADDENDUM: Discussed with Dr. Jeanell Sparrow.  Patient indicates she has had a prior hysterectomy.  The soft tissue structure identified superior to the vaginal cuff has the appearance of a uterus.  If patient has had a hysterectomy, then this could represent either a retained cervix containing several nodules such as complex nabothian cysts or cervical leiomyomata, or could represent an ovarian mass.  This lesion appears separate from the bladder and bowel.  Further characterization of this lesion by transvaginal ultrasound imaging recommended.   Electronically Signed   By: Lavonia Dana M.D.    On: 10/22/2019 14:14   Signed by Lavonia Dana, MD on 10/22/2019 2:16 PM  Narrative & Impression  CLINICAL DATA:  Abdominal and pelvic pain for 1 week  EXAM: CT ABDOMEN AND PELVIS WITH CONTRAST  TECHNIQUE: Multidetector CT imaging of the abdomen and pelvis was performed using the standard protocol following bolus administration of intravenous contrast. Sagittal and coronal MPR images reconstructed from axial data set.  CONTRAST:  152mL OMNIPAQUE IOHEXOL 300 MG/ML SOLN IV. No oral contrast administered.  COMPARISON:  10/13/2017  FINDINGS: Lower chest: Minimal dependent subsegmental atelectasis LEFT lower lobe  Hepatobiliary: Gallbladder surgically absent. Liver normal appearance.  Pancreas: Normal appearance  Spleen: Normal appearance  Adrenals/Urinary Tract: Adrenal glands, kidneys, ureters, and bladder normal appearance  Stomach/Bowel: Normal appendix. Minimal distal colonic diverticulosis without evidence of diverticulitis. Prior gastric stapling. Bowel loops otherwise unremarkable.  Vascular/Lymphatic: Atherosclerotic calcifications aorta and iliac arteries without aneurysm. No adenopathy.  Reproductive: Nodular appearance of the uterus consistent with small leiomyomata 1.9 x 1.5 cm anteriorly and 1.8 x 1.3 cm posteriorly. Unremarkable adnexa.  Other: No free air free fluid. Tiny umbilical hernia containing fat. No inflammatory process.  Musculoskeletal: Interspinous fusion posteriorly L4-L5. Bones demineralized.  IMPRESSION: Minimal distal colonic diverticulosis without evidence of diverticulitis.  Small uterine leiomyomata.  Tiny umbilical hernia containing fat.  No acute intra-abdominal or intrapelvic abnormalities.  Aortic Atherosclerosis (ICD10-I70.0).  Electronically Signed: By: Lavonia Dana M.D. On: 10/22/2019 13:46    Assessment: pt stable  Plan:  1. Cervical cancer screening - Cytology - PAP( [facility])  2.  Pelvic mass Nothing in care everywhere so ROI filled out and sent off. Will get pelvic MRI. I told her it sounds like she probably had a l/s [facility] but op note will clarify this and MRI needed in order to delineate exactly what is going on and where but it sounds like she may have some fibroids that likely came about after her hyst - [place]; Future  Orders Placed This Encounter  Procedures  . MR PELVIS W CONTRAST    RTC after imaging.   Durene Romans MD Attending [facility] for [employer] farm manager)

## 2019-12-05 LAB — CYTOLOGY - PAP
Chlamydia: NEGATIVE
Comment: NEGATIVE
Comment: NEGATIVE
Comment: NEGATIVE
Comment: NORMAL
Diagnosis: NEGATIVE
High risk HPV: NEGATIVE
Neisseria Gonorrhea: NEGATIVE
Trichomonas: NEGATIVE

## 2019-12-14 ENCOUNTER — Emergency Department (HOSPITAL_COMMUNITY)
Admission: EM | Admit: 2019-12-14 | Discharge: 2019-12-15 | Disposition: A | Discharge status: Home / Self Care | Payer: 59 | Attending: Emergency Medicine | Admitting: Emergency Medicine

## 2019-12-14 ENCOUNTER — Other Ambulatory Visit: Payer: Self-pay

## 2019-12-14 DIAGNOSIS — L02214 Cutaneous abscess of groin: Secondary | ICD-10-CM | POA: Insufficient documentation

## 2019-12-14 DIAGNOSIS — Z5321 Procedure and treatment not carried out due to patient leaving prior to being seen by health care provider: Secondary | ICD-10-CM | POA: Insufficient documentation

## 2019-12-14 NOTE — ED Notes (Signed)
Patient states she will go to urgent care in the morning d/t wait time.

## 2019-12-14 NOTE — ED Triage Notes (Signed)
Pt reports abscess to R groin x 2 weeks. Hx of same. Denies fevers or chills.

## 2019-12-15 ENCOUNTER — Other Ambulatory Visit: Payer: Self-pay

## 2019-12-15 ENCOUNTER — Emergency Department (HOSPITAL_COMMUNITY)
Admission: EM | Admit: 2019-12-15 | Discharge: 2019-12-15 | Disposition: A | Discharge status: Home / Self Care | Payer: 59 | Attending: Emergency Medicine | Admitting: Emergency Medicine

## 2019-12-15 ENCOUNTER — Ambulatory Visit (HOSPITAL_COMMUNITY)
Admission: RE | Admit: 2019-12-15 | Discharge: 2019-12-15 | Disposition: A | Discharge status: Home / Self Care | Payer: 59 | Source: Ambulatory Visit | Attending: Obstetrics and Gynecology | Admitting: Obstetrics and Gynecology

## 2019-12-15 DIAGNOSIS — R1900 Intra-abdominal and pelvic swelling, mass and lump, unspecified site: Secondary | ICD-10-CM | POA: Diagnosis not present

## 2019-12-15 DIAGNOSIS — Z5321 Procedure and treatment not carried out due to patient leaving prior to being seen by health care provider: Secondary | ICD-10-CM | POA: Insufficient documentation

## 2019-12-15 DIAGNOSIS — K651 Peritoneal abscess: Secondary | ICD-10-CM | POA: Diagnosis not present

## 2019-12-15 IMAGING — MR MR PELVIS WO/W CM
11 of 16 series · 19 of 40 positions shown · IV contrast (Yes GAD)
Comparison: CT abdomen/pelvis dated [DATE] and [DATE].

CLINICAL DATA: Pelvic mass on CT in a patient that is reportedly
status post laparoscopic hysterectomy in [[place]].

EXAM:
MRI PELVIS WITHOUT AND WITH CONTRAST
TECHNIQUE: Multiplanar multisequence MR imaging of the pelvis was performed
both before and after administration of intravenous contrast.
CONTRAST:  10mL GADAVIST GADOBUTROL 1 MMOL/ML IV SOLN

[Series 3: T2 · coronal · 5.0mm · 0.59mm/px · 2 of 35 slices shown (1 of 4)]
[im 1/35]
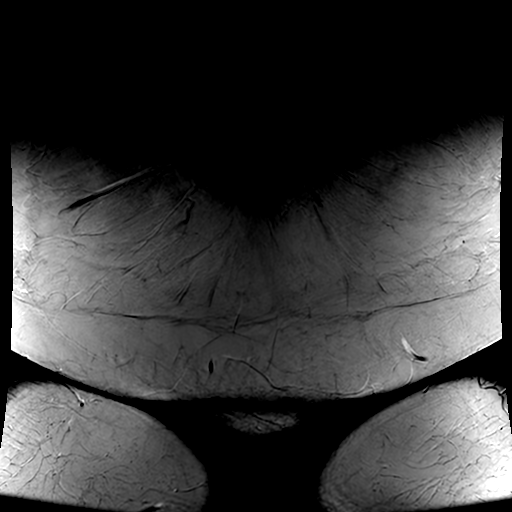
[im 35/35]
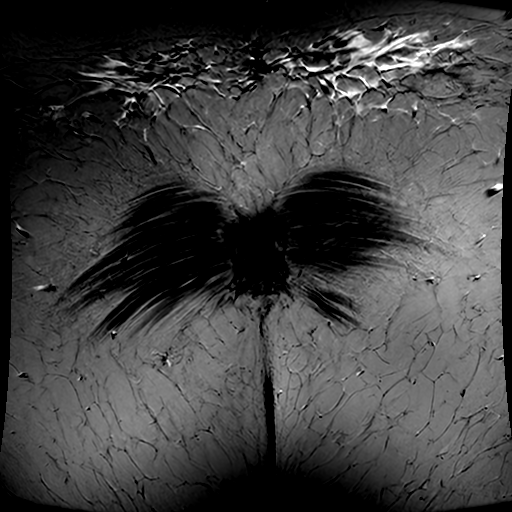

[Series 4: T2 · sagittal · 5.0mm · 0.51mm/px · 1 of 32 slices shown (2 of 4)]
[im 1/32]
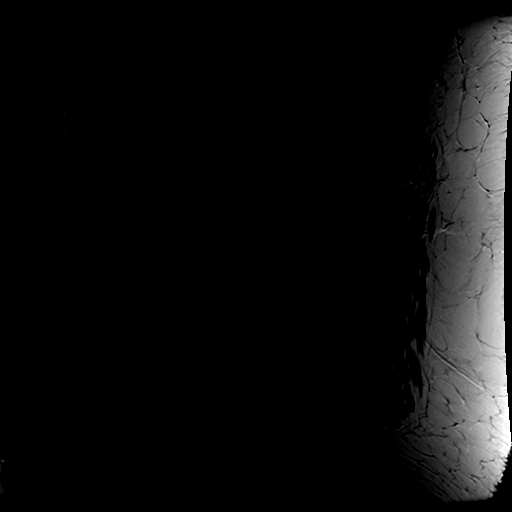

[Series 5: T2 · axial · 5.0mm · 0.51mm/px · 1 of 34 slices shown (3 of 4)]
[im 1/34]
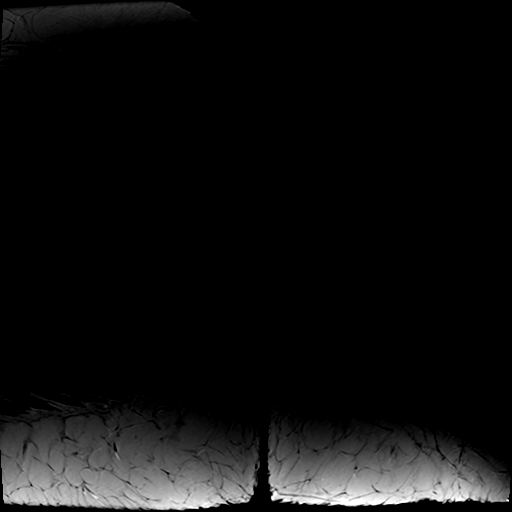

[Series 6: T2 fat-sat · axial · 5.0mm · 0.55mm/px · 1 of 34 slices shown]
[im 1/34]
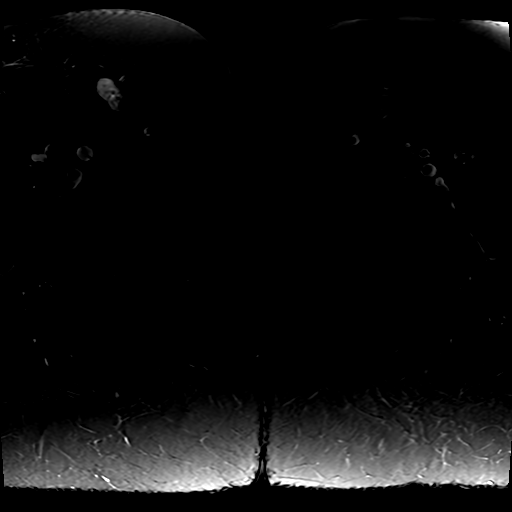

[Series 7: T1 fat-sat · axial · 5.0mm · 0.55mm/px · 1 of 34 slices shown (1 of 5)]
[im 1/34]
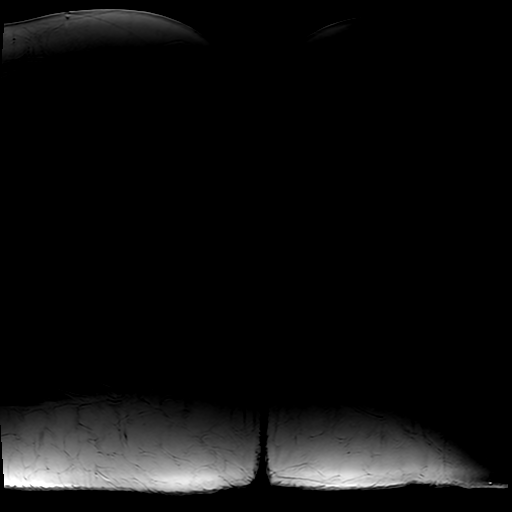

[Series 8: T1 fat-sat · axial · 5.0mm · 0.55mm/px · 1 of 34 slices shown (2 of 5)]
[im 1/34]
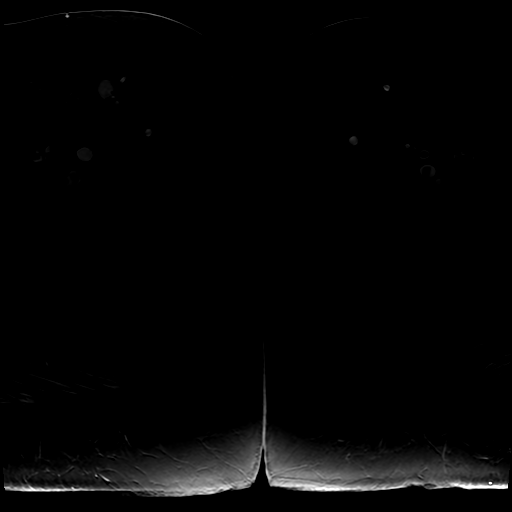

[Series 9: T1 fat-sat · coronal · 5.0mm · 0.59mm/px · 1 of 33 slices shown (3 of 5)]
[im 1/33]
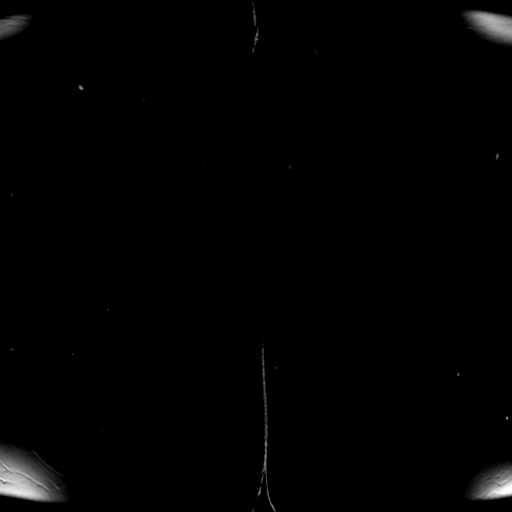

[Series 13: T1 dynamic · sagittal · 4.0mm · 0.47mm/px · 8 of 300 slices shown]
[im 1/300]
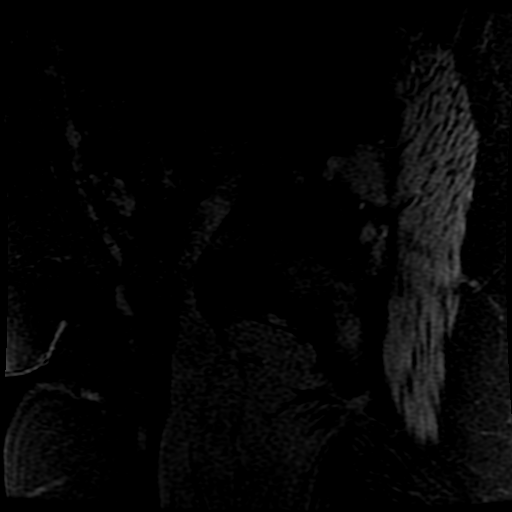
[im 34/300]
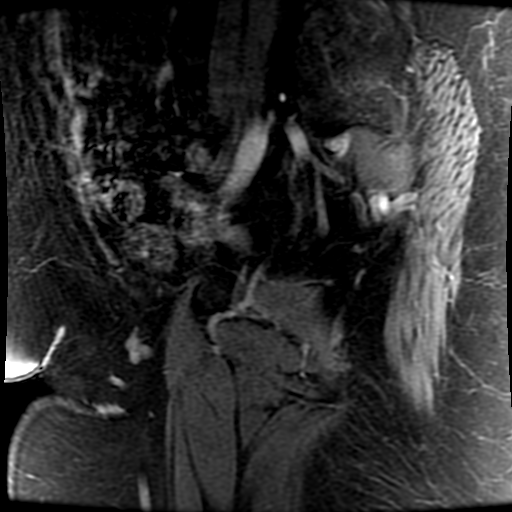
[im 100/300]
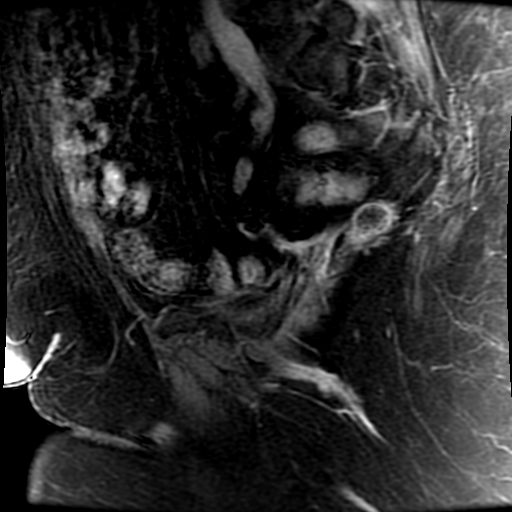
[im 133/300]
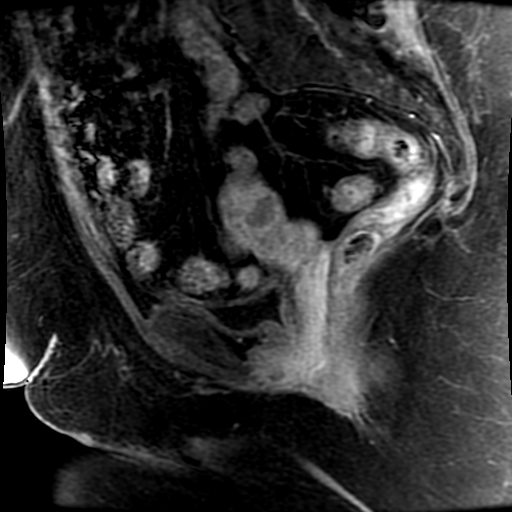
[im 167/300]
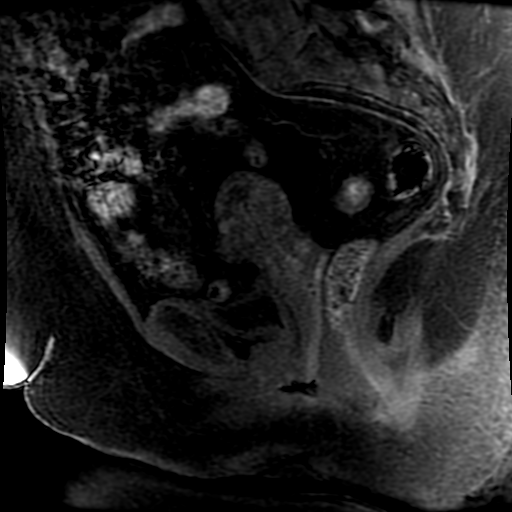
[im 200/300]
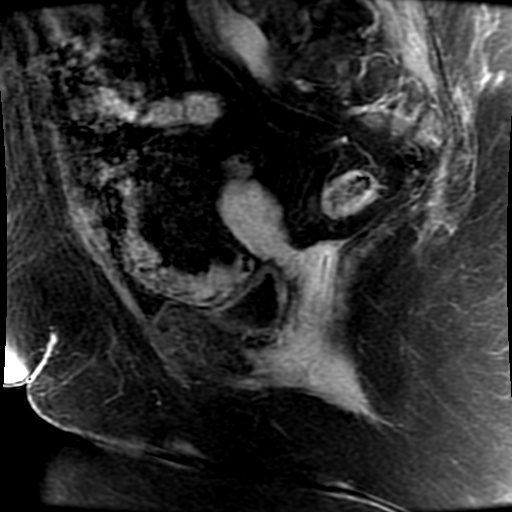
[im 266/300]
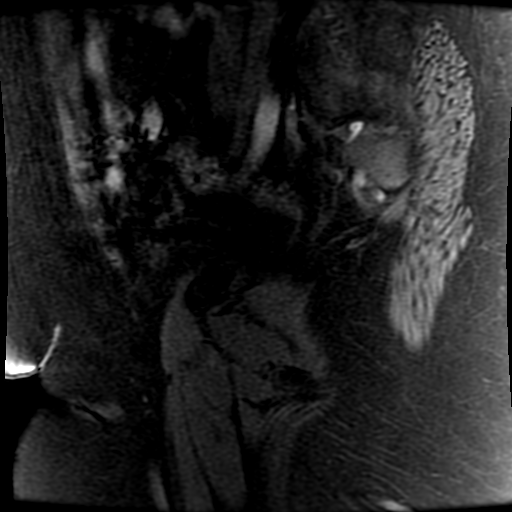
[im 300/300]
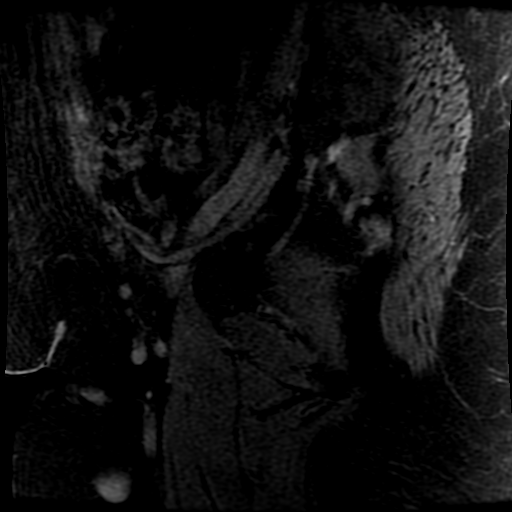

[Series 14: T1 fat-sat · coronal · 5.0mm · 0.59mm/px · 1 of 33 slices shown (4 of 5)]
[im 1/33]
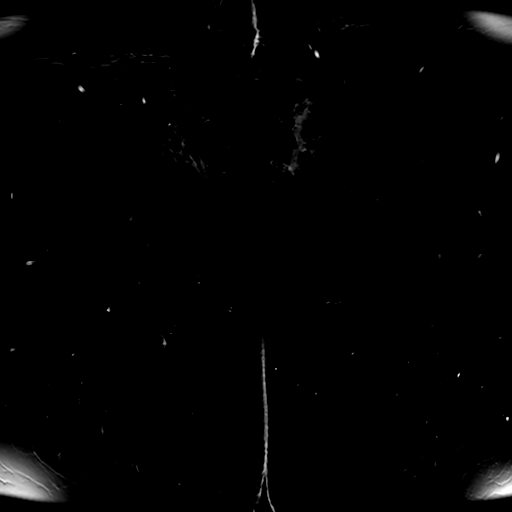

[Series 15: T1 fat-sat · axial · 5.0mm · 0.55mm/px · 1 of 34 slices shown (5 of 5)]
[im 1/34]
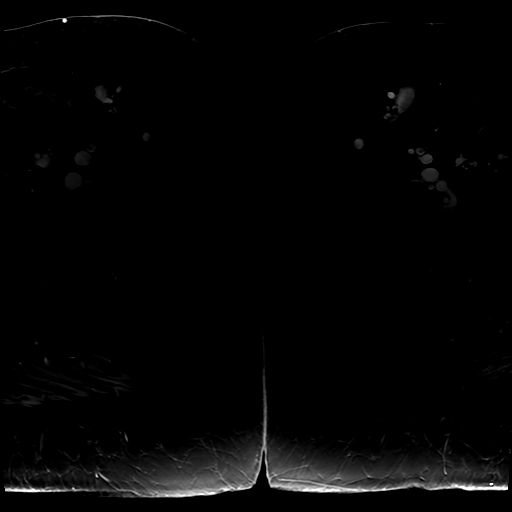

[Series 16: T2 · coronal · 5.0mm · 0.59mm/px · 1 of 35 slices shown (4 of 4)]
[im 1/35]
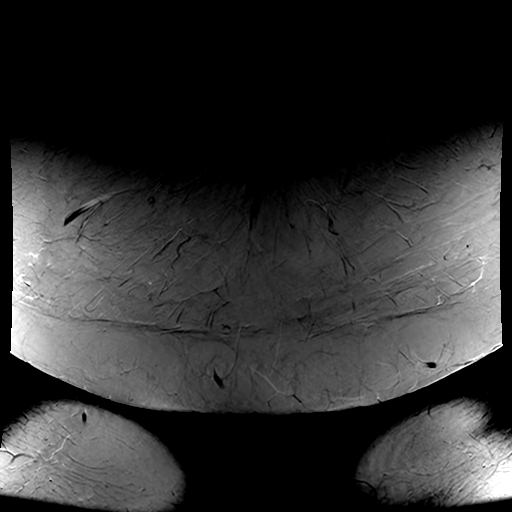

[19 of 40 positions shown; findings below may reference images not displayed]

FINDINGS: Urinary Tract:  Low lying bladder, underdistended.

Bowel: Scattered mild sigmoid diverticulosis, without evidence of
diverticulitis.

Vascular/Lymphatic: No evidence of aneurysm.

No suspicious pelvic lymphadenopathy.

Reproductive: Despite the reported history of laparoscopic
hysterectomy, the patient has a uterus in situ, corresponding to the
CT abnormality. Uterus measures 4.3 x 4.7 x 5.7 cm. Multiple uterine
fibroids, approximately 6-7 in number, including a dominant 2.1 cm
subserosal fibroid in the right posterior uterine body (series
5/image 21). All fibroids enhance following contrast administration.
Endometrial complex measures 4 mm, within normal limits.

No adnexal masses.

Other:  No pelvic ascites.

Musculoskeletal: Degenerative changes at L5-S1.
IMPRESSION: Fibroid uterus in situ, as described above, corresponding to the CT
abnormality.

## 2019-12-15 MED ORDER — GADOBUTROL 1 MMOL/ML IV SOLN
10.0000 mL | Freq: Once | INTRAVENOUS | Status: AC | PRN
  Administered 2019-12-15: 10 mL via INTRAVENOUS

## 2019-12-15 NOTE — ED Triage Notes (Signed)
Patient c/o pelvic abscess x 2 weeks that might need drainage.

## 2019-12-15 NOTE — ED Notes (Signed)
Pt stated she has spoken to her Gynecologist Office and that they are going to see her in their Office. Pt has left

## 2019-12-18 ENCOUNTER — Encounter: Payer: Self-pay | Admitting: Obstetrics and Gynecology

## 2019-12-21 ENCOUNTER — Encounter (HOSPITAL_COMMUNITY): Payer: Self-pay | Admitting: *Deleted

## 2019-12-21 ENCOUNTER — Other Ambulatory Visit: Payer: Self-pay

## 2019-12-21 ENCOUNTER — Emergency Department (HOSPITAL_COMMUNITY)
Admission: EM | Admit: 2019-12-21 | Discharge: 2019-12-22 | Disposition: A | Discharge status: Home / Self Care | Payer: 59 | Attending: Emergency Medicine | Admitting: Emergency Medicine

## 2019-12-21 ENCOUNTER — Emergency Department (HOSPITAL_COMMUNITY): Payer: 59

## 2019-12-21 DIAGNOSIS — R030 Elevated blood-pressure reading, without diagnosis of hypertension: Secondary | ICD-10-CM

## 2019-12-21 DIAGNOSIS — Z96612 Presence of left artificial shoulder joint: Secondary | ICD-10-CM | POA: Diagnosis not present

## 2019-12-21 DIAGNOSIS — Z79899 Other long term (current) drug therapy: Secondary | ICD-10-CM | POA: Diagnosis not present

## 2019-12-21 DIAGNOSIS — Z96652 Presence of left artificial knee joint: Secondary | ICD-10-CM | POA: Diagnosis not present

## 2019-12-21 DIAGNOSIS — M25561 Pain in right knee: Secondary | ICD-10-CM | POA: Diagnosis not present

## 2019-12-21 DIAGNOSIS — Z96611 Presence of right artificial shoulder joint: Secondary | ICD-10-CM | POA: Diagnosis not present

## 2019-12-21 IMAGING — CR DG KNEE COMPLETE 4+V*R*
4 series · 4 of 4 positions shown · non-contrast
Comparison: None.

CLINICAL DATA: Right knee pain following getting up from chair,
unable to bear weight, initial encounter

EXAM:
RIGHT KNEE - COMPLETE 4+ VIEW

[t knee ap right]
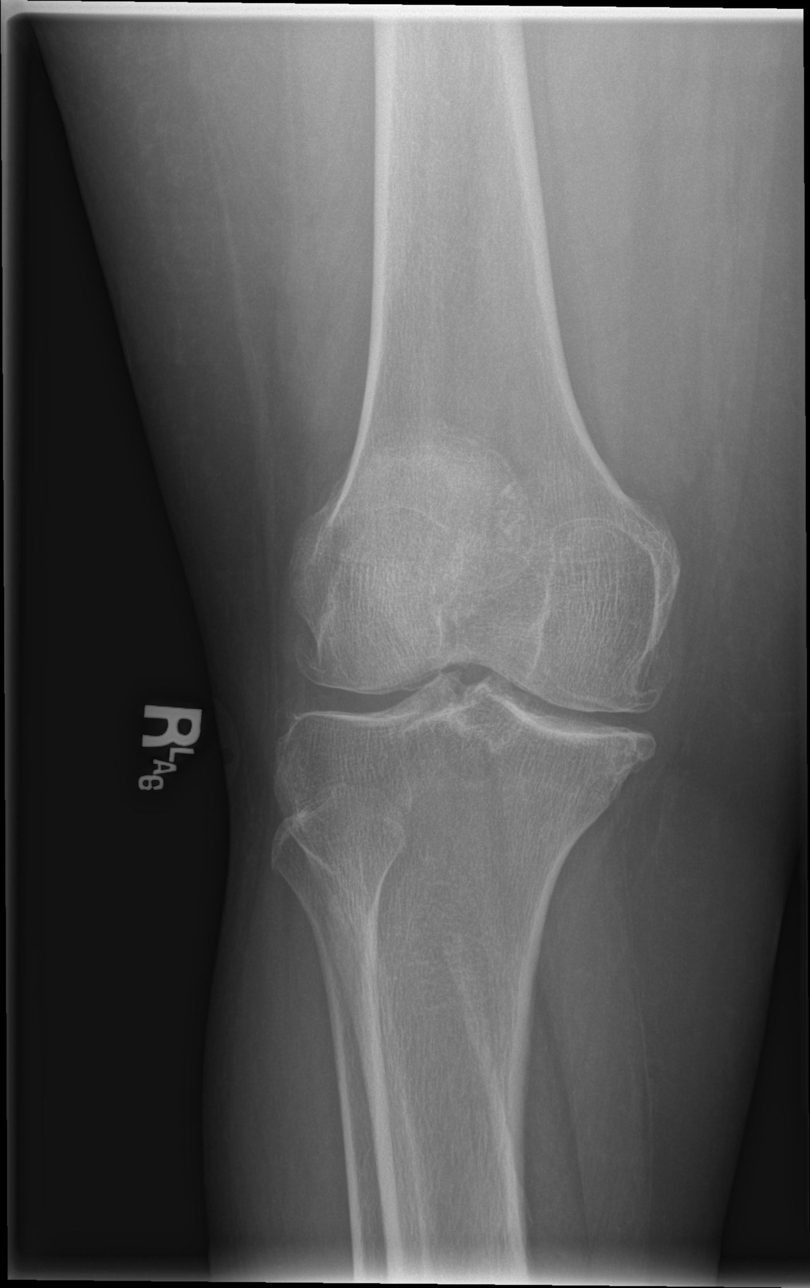

[t knee obl right (1 of 2)]
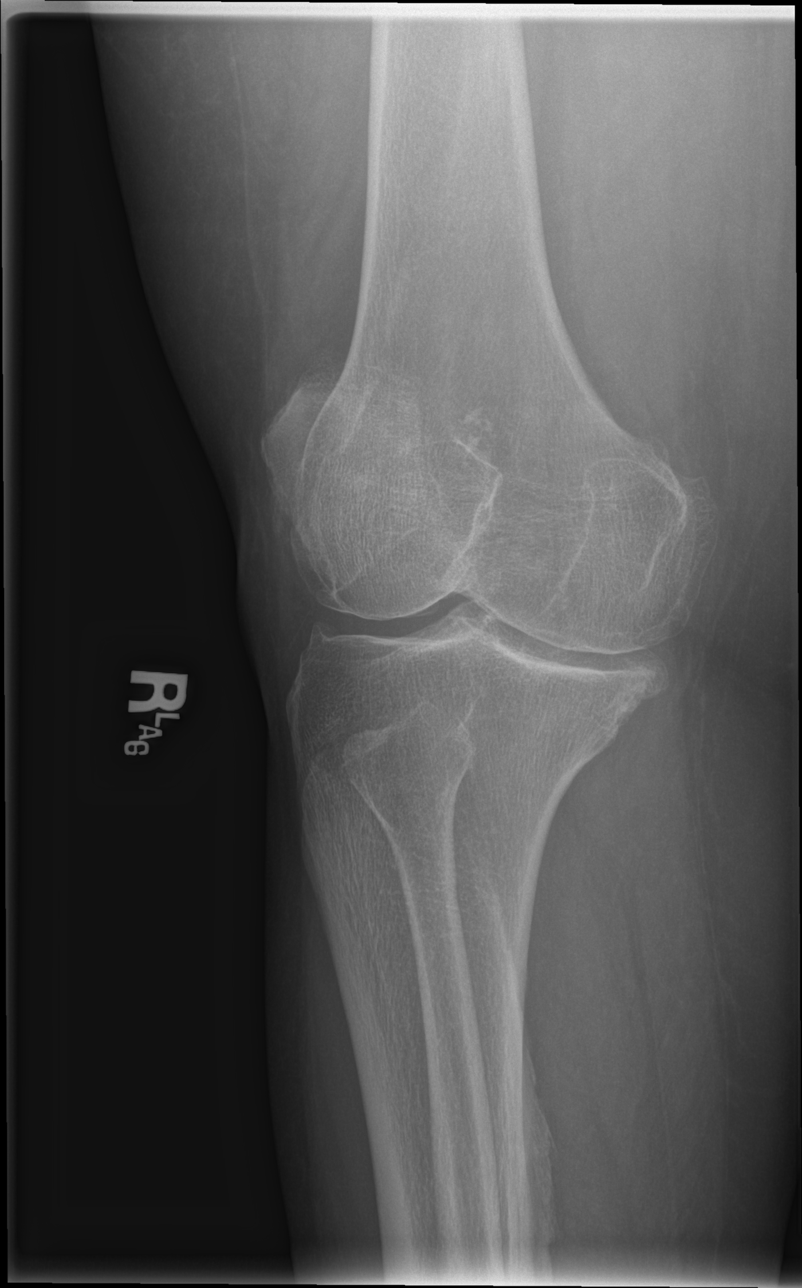

[t knee obl right (2 of 2)]
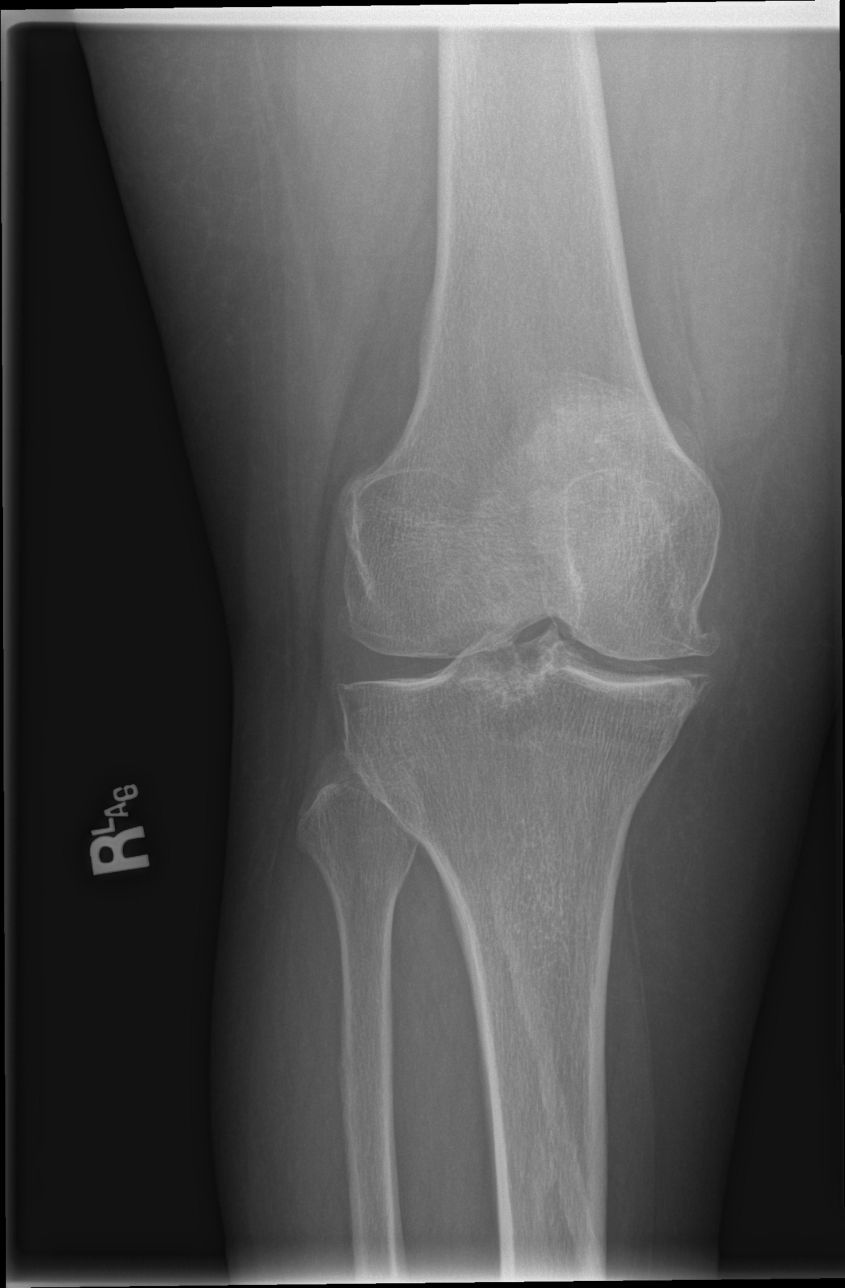

[t knee lat right]
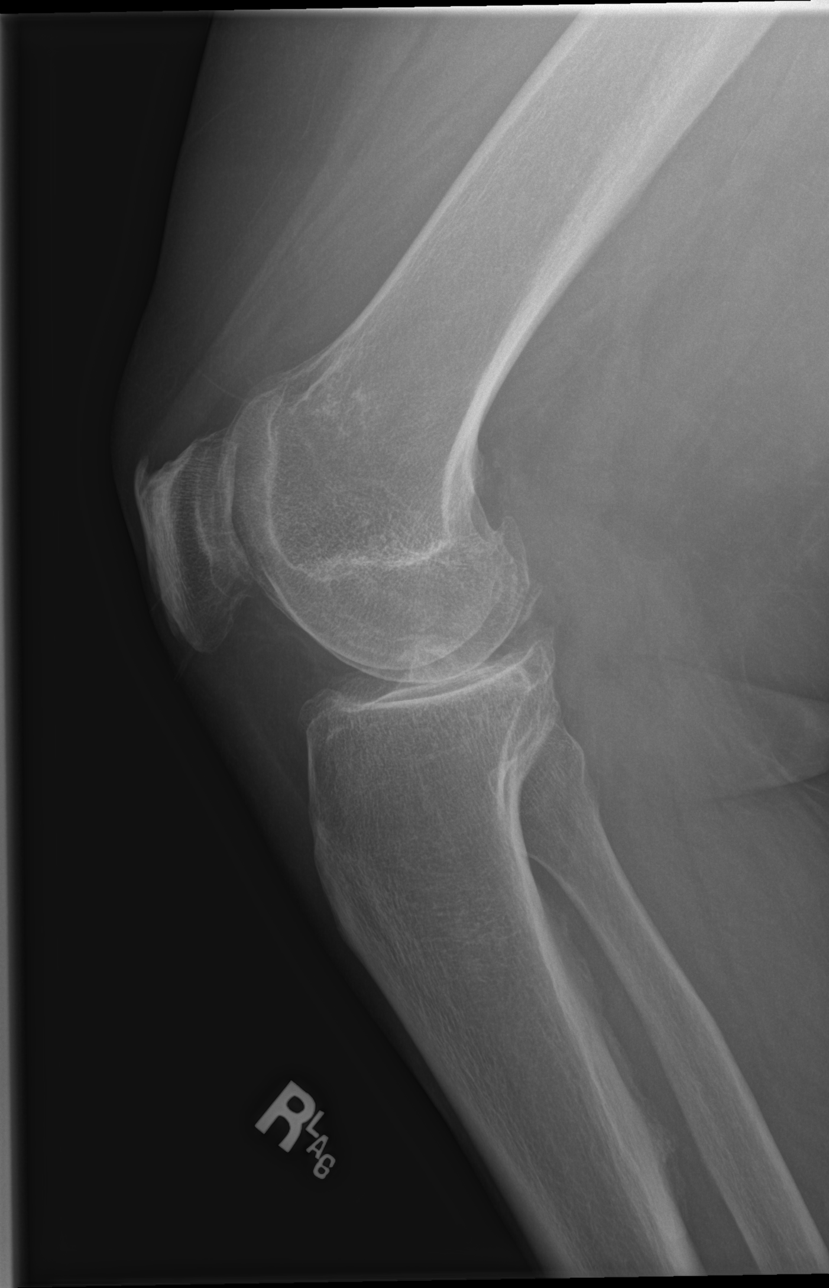

[4 of 4 positions shown; findings below may reference images not displayed]

FINDINGS: Tricompartmental degenerative changes are noted. No acute fracture
or dislocation is seen. No joint effusion is noted.
IMPRESSION: Degenerative change without acute abnormality.

## 2019-12-21 NOTE — ED Triage Notes (Signed)
Pt states she went to sit down in the chair and she heard a "pop" in her right knee. States unable to ambulate d/t pain. Took robaxin and tylenol PM for pain without relief.

## 2019-12-21 NOTE — Discharge Instructions (Signed)
At this time there does not appear to be the presence of an emergent medical condition, however there is always the potential for conditions to change. Please read and follow the below instructions.  Please return to the Emergency Department immediately for any new or worsening symptoms. Please be sure to follow up with your Primary Care Provider within one week regarding your visit today; please call their office to schedule an appointment even if you are feeling better for a follow-up visit. Please use the knee brace and crutches provided to keep weight off of your leg while moving around.  You may remove the knee brace while resting and elevating your leg.  You may use rest ice and elevation to help with pain.  Please call your orthopedist for further evaluation of your knee pain, as we discussed you may need further imaging and work-up for the diagnosis of the cause of pain.  If you unable to contact your orthopedist you may call the on-call orthopedic specialist Dr. Lyla Glassing on your discharge paperwork for further evaluation. Your blood pressure was elevated in the emergency department today.  Please have your blood pressure rechecked by your primary care doctor within 1 week and discuss medication management at that time.  Take all of your home medications as prescribed by your primary care doctor.  Get help right away if you: Get a very bad headache. Start to feel mixed up (confused). Feel weak or numb. Feel faint. Have very bad pain in your: Chest. Belly (abdomen). Throw up more than once. Have trouble breathing. Your knee swells, and the swelling gets worse. You cannot move your knee. You have very bad knee pain. You have any new/concerning or worsening of symptoms Please read the additional information packets attached to your discharge summary.  Do not take your medicine if  develop an itchy rash, swelling in your mouth or lips, or difficulty breathing; call 911 and seek immediate  emergency medical attention if this occurs.  You may review your lab tests and imaging results in their entirety on your MyChart account.  Please discuss all results of fully with your primary care provider and other specialist at your follow-up visit.  Note: Portions of this text may have been transcribed using voice recognition software. Every effort was made to ensure accuracy; however, inadvertent computerized transcription errors may still be present.

## 2019-12-21 NOTE — ED Notes (Signed)
Pt has her leg up on a chair and when this writer asked if she was able to put it down so this Probation officer can get her vitals she said it hurts too much to move it because something in her knee popped.

## 2019-12-21 NOTE — ED Provider Notes (Signed)
[facility] DEPT Provider Note   CSN: [id number] Arrival date & time: 12/21/19  2018     History Chief Complaint  Patient presents with  . Knee Pain    Connie Hoffman is a 59 y.o. female history of arthritis, degenerative disc disease, spinal stenosis, obesity.  Patient presents for right knee pain that began earlier today, she reports she was sitting down onto a chair when she felt a pop in her right knee, this caused immediate pain primarily to the medial side of the right knee pain is throbbing constant nonradiating worsened with movement improved with rest and time.  Patient reports that when the pain occurred she sat back into the chair she denies fall to the ground or any other injuries.  Denies recent illness, fever/chills, numbness/weakness, tingling, back pain, neck pain, swelling/color change or any additional concerns.  HPI     Past Medical History:  Diagnosis Date  . DDD (degenerative disc disease), lumbar   . Psoriatic arthritis ([place])   . Spinal stenosis     Patient Active Problem List   Diagnosis Date Noted  . Pelvic mass 12/04/2019  . DDD (degenerative disc disease), lumbar 01/21/2018  . Primary osteoarthritis of both hands 01/21/2018  . Primary osteoarthritis of both feet 01/21/2018  . Psoriasis 12/01/2017  . Psoriatic arthritis ([place]) 12/01/2017  . High risk medication use 12/01/2017  . 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 Thrombocytosis ([place]) 12/01/2017  . 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    Past Surgical History:  Procedure Laterality Date  . BACK SURGERY  05/18/2019   L4 and L5  . CHOLECYSTECTOMY    . CYSTECTOMY  07/2017  . JOINT REPLACEMENT Left    left knee  . LAPAROSCOPIC ENDOMETRIOSIS FULGURATION  2011  . [place] RESECTION  2018  . LAPAROSCOPIC HYSTERECTOMY  2014  . LAPAROSCOPIC OOPHERECTOMY Right   . LUMBAR EPIDURAL INJECTION  04/13/2018  . REVISION TOTAL KNEE  ARTHROPLASTY Left 12/2017  . TOTAL SHOULDER ARTHROPLASTY Bilateral   . TUBAL LIGATION     postpartum     OB History    Gravida  3   Para      Term      Preterm      AB      Living  3     SAB      TAB      Ectopic      Multiple      Live Births  3        Obstetric Comments  Vaginal deliveries x 3.         Family History  Problem Relation Age of Onset  . Diabetes Mother   . Hypertension Mother   . Heart Problems Mother   . Congestive Heart Failure Mother   . Diabetes Father   . Hypertension Father   . Stroke Sister   . Heart Problems Sister   . Prostate cancer Brother   . Diabetes Brother   . Asthma Daughter   . Healthy Daughter   . Healthy Son   . Healthy Son     Social History   Tobacco Use  . Smoking status: Never Smoker  . Smokeless tobacco: Never Used  Vaping Use  . Vaping Use: Never used  Substance Use Topics  . Alcohol use: Never  . Drug use: Never    Home Medications Prior to Admission medications   Medication Sig Start Date End Date Taking? Authorizing Provider  COSENTYX SENSOREADY, 300 MG, 150 MG/ML SOAJ INJECT 300MG  SUBCUTANEOUSLY EVERY 4 WEEKS 11/16/19   Ofilia Neas, PA-C  diphenhydramine-acetaminophen (TYLENOL PM) 25-500 MG TABS tablet Take 1 tablet by mouth at bedtime as needed.    [provider]  DULoxetine (CYMBALTA) 30 MG capsule Take 30 mg by mouth daily.    [provider]  folic acid (FOLVITE) 1 MG tablet Take 1 mg by mouth daily.    [provider]  gabapentin (NEURONTIN) 100 MG capsule Take 100 mg by mouth 3 (three) times daily.    [provider]  ketoconazole (NIZORAL) 2 % cream Apply 1 application topically daily. 07/27/19   Ofilia Neas, PA-C  Menthol, Topical Analgesic, (BIOFREEZE) 4 % GEL Apply 1 application topically See admin instructions. Apply to painful sites as directed    [provider]  methotrexate (RHEUMATREX) 2.5 MG tablet TAKE 5 TABLETS BY MOUTH ONCE A  WEEK. PROTECT FROM LIGHT. 11/08/19   Ofilia Neas, PA-C  Multiple Vitamin (MULTIVITAMIN WITH MINERALS) TABS tablet Take 1 tablet by mouth daily.    [provider]  triamcinolone ointment (KENALOG) 0.1 % Apply 1 application topically 2 (two) times daily as needed (for itching). Prn  10/05/17   [provider]    Allergies    Aspirin  Review of Systems   Review of Systems  Constitutional: Negative for chills and fever.  Musculoskeletal: Positive for arthralgias. Negative for back pain, joint swelling and neck pain.  Skin: Negative.  Negative for color change.  Neurological: Negative.  Negative for weakness and numbness.    Physical Exam Updated Vital Signs BP (!) 163/110 (BP Location: Left Arm)   Pulse 69   Temp 98.1 F (36.7 C) (Oral)   Resp 18   Ht 5\' 5"  (1.651 m)   Wt 115.7 kg   SpO2 100%   BMI 42.43 kg/m   Physical Exam Constitutional:      General: She is not in acute distress.    Appearance: Normal appearance. She is well-developed. She is obese. She is not ill-appearing or diaphoretic.  HENT:     Head: Normocephalic and atraumatic.  Eyes:     General: Vision grossly intact. Gaze aligned appropriately.     Pupils: Pupils are equal, round, and reactive to light.  Neck:     Trachea: Trachea and phonation normal.  Pulmonary:     Effort: Pulmonary effort is normal. No respiratory distress.  Abdominal:     General: There is no distension.     Palpations: Abdomen is soft.     Tenderness: There is no abdominal tenderness. There is no guarding or rebound.  Musculoskeletal:        General: Normal range of motion.     Cervical back: Normal range of motion.     Comments: Right Knee:  Appearance normal. No obvious deformity. No skin swelling, erythema, heat, fluctuance or break of the skin.  Tenderness to palpation over Medial Joint Line.  Limited range of motion secondary to pain.  No gross varus or valgus laxity or locking.  Resisted range of motion with  flexion extension intact. No tenderness to palpation of hips or ankles.Compartments soft. Neurovascularly intact distally.  Skin:    General: Skin is warm and dry.  Neurological:     Mental Status: She is alert.     GCS: GCS eye subscore is 4. GCS verbal subscore is 5. GCS motor subscore is 6.     Comments: Speech is clear  and goal oriented, follows commands Major Cranial nerves without deficit, no facial droop Moves extremities without ataxia, coordination intact  Psychiatric:        Behavior: Behavior normal.     ED Results / Procedures / Treatments   Labs (all labs ordered are listed, but only abnormal results are displayed) Labs Reviewed - No data to display  EKG None  Radiology DG Knee Complete 4 Views Right  Result Date: 12/21/2019 CLINICAL DATA:  Right knee pain following getting up from chair, unable to bear weight, initial encounter EXAM: RIGHT KNEE - COMPLETE 4+ VIEW COMPARISON:  None. FINDINGS: Tricompartmental degenerative changes are noted. No acute fracture or dislocation is seen. No joint effusion is noted. IMPRESSION: Degenerative change without acute abnormality. Electronically Signed   By: Inez Catalina M.D.   On: 12/21/2019 22:07    Procedures Procedures (including critical care time)  Medications Ordered in ED Medications - No data to display  ED Course  I have reviewed the triage vital signs and the nursing notes.  Pertinent labs & imaging results that were available during my care of the patient were reviewed by me and considered in my medical decision making (see chart for details).    MDM Rules/Calculators/A&P                          Additional history obtained from: 1. Nursing notes from this visit. ------------------------------------------------------------ DG Right Knee:  IMPRESSION:  Degenerative change without acute abnormality.   Patient neurovascular intact, no evidence of cellulitis, DVT, septic arthritis, compartment syndrome,  neurovascular compromise or other emergent pathologies.  Possible patient may have ligamentous/meniscal or other soft tissue injury of the knee placed in the brace and given crutches advised nonweightbearing until evaluated by orthopedist.  She will use rice therapy and OTC anti-inflammatories to help with her symptoms.  Patient states understanding of limitations of x-rays in the ER and that further imaging and evaluation will be needed for full diagnosis.  She has no additional questions or concerns.  Patient noted to be hypertensive in the ER she is completely asymptomatic, advised patient to have blood pressure rechecked by PCP within 1 week and discuss medication management if needed at that time.  Signs and symptoms of hypertensive urgency/emergency given to patient and she is to return to the ER if they occur.  At this time there does not appear to be any evidence of an acute emergency medical condition and the patient appears stable for discharge with appropriate outpatient follow up. Diagnosis was discussed with patient who verbalizes understanding of care plan and is agreeable to discharge. I have discussed return precautions with patient who verbalizes understanding. Patient encouraged to follow-up with their PCP and ortho. All questions answered.  Patient's case discussed with Dr. Christy Gentles who agrees with plan to discharge with follow-up.   Note: Portions of this report may have been transcribed using voice recognition software. Every effort was made to ensure accuracy; however, inadvertent computerized transcription errors may still be present. Final Clinical Impression(s) / ED Diagnoses Final diagnoses:  Acute pain of right knee  Elevated blood pressure reading    Rx / DC Orders ED Discharge Orders    None       Gari Crown 12/21/19 [id number]    Ripley Fraise, MD 12/22/19 [phone]

## 2019-12-25 NOTE — Progress Notes (Signed)
Office Visit Note  Patient: Connie Hoffman             Date of Birth: August 13, 1960           MRN: [id number]             PCP: Jenel Lucks, PA-C Referring: Burna Cash* Visit Date: 01/05/2020 Occupation: @GUAROCC @  Subjective:  Right wrist joint pain   History of Present Illness: Connie Hoffman is a 59 y.o. female with history of psoriatic arthritis.  She is on Cosentyx 300 mg sq injections every 28 days, MTX 5 tablets by mouth once weekly, and folic acid 2 mg po daily.  Patient reports she has been under a tremendous amount of stress recently.  She states her brother passes away in 2019-09-07, and she has been having to drive back and forth to [place] to handle his affairs.  She states in May she had a flare of psoriasis on his torso and used shea butter and gold bond, which resolved her flare.  She denies any psoriasis at this time.  She states she has been experiencing severe pain and intermittent inflammation in the right wrist joint and right hand.  She has also had intermittent discomfort in the right knee joint, and she was evaluated by her orthopedist on 12/21/19.  She had updated x-rays and a cortisone injection at that visit.  She states she does not have much discomfort in her left knee which is replaced but she does have limited flexion and extension. She states she has started to go to physical therapy for generalized musculoskeletal pain.     Activities of Daily Living: Patient reports morning stiffness for 45 minutes.   Patient Reports nocturnal pain.  Difficulty dressing/grooming: Denies Difficulty climbing stairs: Reports Difficulty getting out of chair: Reports Difficulty using hands for taps, buttons, cutlery, and/or writing: Reports  Review of Systems  Constitutional: Positive for fatigue.  HENT: Positive for mouth sores. Negative for mouth dryness and nose dryness.   Eyes: Positive for dryness. Negative for pain and visual disturbance.    Respiratory: Negative for cough, hemoptysis, shortness of breath and difficulty breathing.   Cardiovascular: Negative for chest pain, palpitations, hypertension and swelling in legs/feet.  Gastrointestinal: Negative for blood in stool, constipation and diarrhea.  Endocrine: Negative for increased urination.  Genitourinary: Negative for painful urination.  Musculoskeletal: Positive for arthralgias, joint pain, joint swelling and morning stiffness. Negative for myalgias, muscle weakness, muscle tenderness and myalgias.  Skin: Negative for color change, pallor, rash, hair loss, nodules/bumps, skin tightness, ulcers and sensitivity to sunlight.  Allergic/Immunologic: Negative for susceptible to infections.  Neurological: Negative for dizziness, numbness, headaches and weakness.  Hematological: Negative for swollen glands.  Psychiatric/Behavioral: Positive for depressed mood and sleep disturbance. The patient is nervous/anxious.     PMFS History:  Patient Active Problem List   Diagnosis Date Noted  . Pelvic mass 12/04/2019  . DDD (degenerative disc disease), lumbar 01/21/2018  . Primary osteoarthritis of both hands 01/21/2018  . Primary osteoarthritis of both feet 01/21/2018  . Psoriasis 12/01/2017  . Psoriatic arthritis ([place]) 12/01/2017  . High risk medication use 12/01/2017  . 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 Thrombocytosis ([place]) 12/01/2017  . 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 DDD (degenerative disc disease), lumbar   . Psoriatic arthritis ([place])   . Spinal stenosis     Family History  Problem Relation Age of Onset  .  Diabetes Mother   . Hypertension Mother   . Heart Problems Mother   . Congestive Heart Failure Mother   . Diabetes Father   . Hypertension Father   . Stroke Sister   . Heart Problems Sister   . Prostate cancer Brother   . Diabetes Brother   . Asthma Daughter   . Healthy Daughter   . Healthy Son    . Healthy Son    Past Surgical History:  Procedure Laterality Date  . BACK SURGERY  05/18/2019   L4 and L5  . CHOLECYSTECTOMY    . CYSTECTOMY  07/2017  . JOINT REPLACEMENT Left    left knee  . LAPAROSCOPIC ENDOMETRIOSIS FULGURATION  2011  . [place] RESECTION  2018  . LAPAROSCOPIC HYSTERECTOMY  2014  . LAPAROSCOPIC OOPHERECTOMY Right   . LUMBAR EPIDURAL INJECTION  04/13/2018  . REVISION TOTAL KNEE ARTHROPLASTY Left 12/2017  . TOTAL SHOULDER ARTHROPLASTY Bilateral   . TUBAL LIGATION     postpartum   Social History   Social History Narrative  . Not on file   Immunization History  Administered Date(s) Administered  . Influenza,inj,Quad PF,6+ Mos 04/15/2018  . PFIZER SARS-COV-2 Vaccination 07/15/2019, 08/05/2019  . Pneumococcal Polysaccharide-23 04/15/2018  . Zoster Recombinat (Shingrix) 05/13/2018     Objective: Vital Signs: BP 109/72 (BP Location: Left Arm, Patient Position: Sitting, Cuff Size: Large)   Pulse 73   Resp 15   Ht 5\' 5"  (1.651 m)   Wt 266 lb (120.7 kg)   BMI 44.26 kg/m    Physical Exam Vitals and nursing note reviewed.  Constitutional:      Appearance: She is well-developed.  HENT:     Head: Normocephalic and atraumatic.  Eyes:     Conjunctiva/sclera: Conjunctivae normal.  Pulmonary:     Effort: Pulmonary effort is normal.  Abdominal:     Palpations: Abdomen is soft.  Musculoskeletal:     Cervical back: Normal range of motion.  Skin:    General: Skin is warm and dry.     Capillary Refill: Capillary refill takes less than 2 seconds.  Neurological:     Mental Status: She is alert and oriented to person, place, and time.  Psychiatric:        Behavior: Behavior normal.      Musculoskeletal Exam: C-spine limited ROM with lateral rotation.  Thoracic and lumbar spine good ROM.  No midline spinal tenderness.  No SI joint tenderness.  Shoulder joint abduction to about 120 degrees bilaterally.  Elbow joints, wrist joints, MCPs,  PIPs, and DIPs good ROM with no synovitis.  Tenderness of the right wrist and all MCPs of the right hand.  Tenderness of the right 1st PIP joint.  Complete fist formation bilaterally.  Hip joints good ROM with no discomfort.  No tenderness over trochanteric bursa bilaterally.  Left knee replacement has limited flexion and extension with warmth.  Right knee has good ROM with no warmth or effusion.  Ankle joints have good ROM with no tenderness or inflammation.  PIP and DIP thickening consistent with osteoarthritis of both feet noted.  Bunions of both 1st MTP joints noted.   CDAI Exam: CDAI Score: 8  Patient Global: 5 mm; Provider Global: 5 mm Swollen: 0 ; Tender: 7  Joint Exam 01/05/2020      Right  Left  Wrist   Tender     MCP 1   Tender     MCP 2   Tender     MCP  3   Tender     MCP 4   Tender     MCP 5   Tender     IP   Tender        Investigation: No additional findings.  Imaging: MR PELVIS W WO CONTRAST  Result Date: 12/15/2019 CLINICAL DATA:  Pelvic mass on CT in a patient that is reportedly status post laparoscopic hysterectomy in 2014. EXAM: MRI PELVIS WITHOUT AND WITH CONTRAST TECHNIQUE: Multiplanar multisequence MR imaging of the pelvis was performed both before and after administration of intravenous contrast. CONTRAST:  16mL GADAVIST GADOBUTROL 1 MMOL/ML IV SOLN COMPARISON:  CT abdomen/pelvis dated 10/22/2019 and 10/13/2017. FINDINGS: Urinary Tract:  Low lying bladder, underdistended. Bowel: Scattered mild sigmoid diverticulosis, without evidence of diverticulitis. Vascular/Lymphatic: No evidence of aneurysm. No suspicious pelvic lymphadenopathy. Reproductive: Despite the reported history of laparoscopic hysterectomy, the patient has a uterus in situ, corresponding to the CT abnormality. Uterus measures 4.3 x 4.7 x 5.7 cm. Multiple uterine fibroids, approximately 6-7 in number, including a dominant 2.1 cm subserosal fibroid in the right posterior uterine body (series 5/image 21).  All fibroids enhance following contrast administration. Endometrial complex measures 4 mm, within normal limits. No adnexal masses. Other:  No pelvic ascites. Musculoskeletal: Degenerative changes at L5-S1. IMPRESSION: Fibroid uterus in situ, as described above, corresponding to the CT abnormality. Electronically Signed   By: Julian Hy M.D.   On: 12/15/2019 15:30   DG Knee Complete 4 Views Right  Result Date: 12/21/2019 CLINICAL DATA:  Right knee pain following getting up from chair, unable to bear weight, initial encounter EXAM: RIGHT KNEE - COMPLETE 4+ VIEW COMPARISON:  None. FINDINGS: Tricompartmental degenerative changes are noted. No acute fracture or dislocation is seen. No joint effusion is noted. IMPRESSION: Degenerative change without acute abnormality. Electronically Signed   By: Inez Catalina M.D.   On: 12/21/2019 22:07    Recent Labs: Lab Results  Component Value Date   WBC 8.5 10/22/2019   HGB 12.4 10/22/2019   PLT 354 10/22/2019   NA 142 10/22/2019   K 4.3 10/22/2019   CL 107 10/22/2019   CO2 27 10/22/2019   GLUCOSE 111 (H) 10/22/2019   BUN 11 10/22/2019   CREATININE 0.70 10/22/2019   BILITOT 0.5 10/22/2019   ALKPHOS 100 10/22/2019   AST 16 10/22/2019   ALT 16 10/22/2019   PROT 7.0 10/22/2019   ALBUMIN 3.4 (L) 10/22/2019   CALCIUM 9.0 10/22/2019   GFRAA >60 10/22/2019   QFTBGOLDPLUS NEGATIVE 05/30/2019    Speciality Comments: No specialty comments available.  Procedures:  No procedures performed Allergies: Aspirin   Assessment / Plan:     Visit Diagnoses: Psoriatic arthritis ([place]): She has no synovitis or dactylitis on exam.  She has been experiencing increased pain and intermittent inflammation in her right hand and right wrist.  On examination today she has tenderness of the right wrist and all MCPs of the right hand.  Tenderness of the right first PIP joint also noted.  She has not had any Achilles tendinitis or plantar fasciitis.  She has no SI joint  tenderness on examination today.  She had a flare of psoriasis on her torso in May 2021.  She is currently on Cosentyx 300 mg subcutaneous injections every 28 days, methotrexate 5 tablets by mouth once weekly, folic acid 2 mg by mouth daily.  She is on the reduced dose of methotrexate due to history of elevated LFTs.  We discussed switching from oral to injectable methotrexate  to try to increase the efficacy.  She is in agreement.  If she continues to have recurrent flares we discussed trying to space the dose of Cosentyx to 150 mg sq injections every 14 days.  We will have to discuss other treatment options in the future if she has persistent flares despite changing the above doses.  She was advised to notify us if she develops increased joint pain or joint swelling.  She will follow-up in the office in 3 months.  Psoriasis: She had a flare of psoriasis in May 2021.  During that time she was under tremendous amount of stress.  She tried using Shea butter and Goldbond and the psoriasis resolved.  She will continue on Cosentyx 300 mg subcutaneous injections every 28 days and will be switching from oral to injectable methotrexate.  She will inject methotrexate 0.5 mL sq injections once weekly.  She was advised to notify us of her psoriasis flares.  High risk medication use - Cosentyx 300 mg every 28 days, methotrexate 0.5 ml sq injections every 7 days, and folic acid 2 mg daily.  She will be switching from oral to injectable methotrexate to increase the efficacy.  She is on the reduced dose of methotrexate due to history of elevated LFTs.  CBC and CMP were updated on 11/20/2019.  She will be due to update lab work in October and every 3 months to monitor for drug toxicity.  Standing orders for CBC and CMP are in place.  TB gold was negative on 2-21 and will continue to be monitored yearly. She has not had any recent infections.  We discussed the importance of holding Cosentyx and methotrexate if she develops signs  or symptoms of an infection and to resume once the infection has completely cleared.  She has received both COVID-19 vaccinations and plans on receiving the third dose.  She was advised to hold methotrexate 1 week after the third dose.  She was also encouraged to avoid taking NSAIDs and Tylenol 24 hours prior to the third dose.  She was advised to notify us or her PCP if she develops a COVID-19 infection in order to receive the antibody infusion.  She voiced understanding.  S/P total knee replacement, left -  Left knee joint revision December 06, 2017, September 2019 manipulation: She has limited flexion extension on exam.  Warmth but no effusion was noted.  Primary osteoarthritis of right knee: Chronic pain.  She had updated x-rays on 12/21/2019 which revealed tricompartmental degenerative changes.  She had a cortisone injection performed by her orthopedist at her most recent office visit.  She has good range of motion of the right knee joint on exam.  No warmth or effusion was noted.  According to the patient she was told needs a knee replacement but is not ready to proceed with surgery at this time.  DDD (degenerative disc disease), lumbar: She has good range of motion of the lumbar spine with no discomfort.  No symptoms of radiculopathy.  No midline spinal tenderness in the lumbar region.  Primary osteoarthritis of both hands: She has PIP and DIP thickening consistent with osteoarthritis of both hands.  CMC joint prominence noted bilaterally.  She has been experiencing increased discomfort and stiffness in her right hand and right wrist.  No synovitis was noted on exam.  She was able to make a complete fist bilaterally. Joint protection and muscle strengthening were discussed.   Primary osteoarthritis of both feet: She has PIP and DIP thickening consistent  with osteoarthritis of both feet.  Bunions noted bilaterally. Ankle joints have good ROM with no tenderness or inflammation. We discussed the  importance of wearing proper fitting shoes.  History of gastric bypass  Thrombocytosis ([place]): Platelets were 354 on 10/22/2019.  We will continue to monitor closely.  Orders: No orders of the defined types were placed in this encounter.  No orders of the defined types were placed in this encounter.    Follow-Up Instructions: Return in about 3 months (around 04/05/2020) for Psoriatic arthritis.   Ofilia Neas, PA-C  Note - This record has been created using Dragon software.  Chart creation errors have been sought, but may not always  have been located. Such creation errors do not reflect on  the standard of medical care.

## 2019-12-29 ENCOUNTER — Encounter: Payer: Self-pay | Admitting: Neurology

## 2020-01-05 ENCOUNTER — Other Ambulatory Visit: Payer: Self-pay

## 2020-01-05 ENCOUNTER — Encounter: Payer: Self-pay | Admitting: Physician Assistant

## 2020-01-05 ENCOUNTER — Ambulatory Visit (INDEPENDENT_AMBULATORY_CARE_PROVIDER_SITE_OTHER): Payer: 59 | Admitting: Physician Assistant

## 2020-01-05 VITALS — BP 109/72 | HR 73 | Resp 15 | Ht 65.0 in | Wt 266.0 lb

## 2020-01-05 DIAGNOSIS — Z79899 Other long term (current) drug therapy: Secondary | ICD-10-CM | POA: Diagnosis not present

## 2020-01-05 DIAGNOSIS — L409 Psoriasis, unspecified: Secondary | ICD-10-CM

## 2020-01-05 DIAGNOSIS — M19042 Primary osteoarthritis, left hand: Secondary | ICD-10-CM

## 2020-01-05 DIAGNOSIS — M51369 Other intervertebral disc degeneration, lumbar region without mention of lumbar back pain or lower extremity pain: Secondary | ICD-10-CM

## 2020-01-05 DIAGNOSIS — M1711 Unilateral primary osteoarthritis, right knee: Secondary | ICD-10-CM

## 2020-01-05 DIAGNOSIS — L4050 Arthropathic psoriasis, unspecified: Secondary | ICD-10-CM

## 2020-01-05 DIAGNOSIS — Z96652 Presence of left artificial knee joint: Secondary | ICD-10-CM

## 2020-01-05 DIAGNOSIS — M19072 Primary osteoarthritis, left ankle and foot: Secondary | ICD-10-CM

## 2020-01-05 DIAGNOSIS — M5136 Other intervertebral disc degeneration, lumbar region: Secondary | ICD-10-CM

## 2020-01-05 DIAGNOSIS — Z9884 Bariatric surgery status: Secondary | ICD-10-CM

## 2020-01-05 DIAGNOSIS — M19041 Primary osteoarthritis, right hand: Secondary | ICD-10-CM

## 2020-01-05 DIAGNOSIS — D75839 Thrombocytosis, unspecified: Secondary | ICD-10-CM

## 2020-01-05 DIAGNOSIS — M19071 Primary osteoarthritis, right ankle and foot: Secondary | ICD-10-CM

## 2020-01-05 DIAGNOSIS — D473 Essential (hemorrhagic) thrombocythemia: Secondary | ICD-10-CM

## 2020-01-05 MED ORDER — "TUBERCULIN SYRINGE 27G X 1/2"" 1 ML MISC": 2 refills | Status: DC

## 2020-01-05 MED ORDER — METHOTREXATE SODIUM CHEMO INJECTION 50 MG/2ML: INTRAMUSCULAR | 0 refills | Status: DC

## 2020-01-05 NOTE — Patient Instructions (Signed)
COVID-19 vaccine recommendations:   COVID-19 vaccine is recommended for everyone (unless you are allergic to a vaccine component), even if you are on a medication that suppresses your immune system.   If you are on Methotrexate, Cellcept (mycophenolate), Rinvoq, Morrie Sheldon, and Olumiant- hold the medication for 1 week after each vaccine. Hold Methotrexate for 2 weeks after the single dose COVID-19 vaccine.   If you are on Orencia subcutaneous injection - hold medication one week prior to and one week after the first COVID-19 vaccine dose (only).   If you are on Orencia IV infusions- time vaccination administration so that the first COVID-19 vaccination will occur four weeks after the infusion and postpone the subsequent infusion by one week.   If you are on Cyclophosphamide or Rituxan infusions please contact your doctor prior to receiving the COVID-19 vaccine.   Do not take Tylenol or any anti-inflammatory medications (NSAIDs) 24 hours prior to the COVID-19 vaccination.   There is no direct evidence about the efficacy of the COVID-19 vaccine in individuals who are on medications that suppress the immune system.   Even if you are fully vaccinated, and you are on any medications that suppress your immune system, please continue to wear a mask, maintain at least six feet social distance and practice hand hygiene.   If you develop a COVID-19 infection, please contact your PCP or our office to determine if you need antibody infusion.  The booster vaccine is now available for immunocompromised patients. It is advised that if you had Pfizer vaccine you should get Coca-Cola booster.  If you had a Moderna vaccine then you should get a Moderna booster. Johnson and Wynetta Emery does not have a booster vaccine at this time.  Please see the following web sites for updated information.    https://[email or web address]/[email or web address]/0/Files/[email or web address]-19-Vaccination-Patient-Resources.pdf  [email or web address]://[email or web address]/About-Us/[email or web address]-Releases/ID/1159  Standing Labs We placed an order today for your standing lab work.   Please have your standing labs drawn in October and every 3 months   If possible, please have your labs drawn 2 weeks prior to your appointment so that the provider can discuss your results at your appointment.  We have open lab daily Monday through Thursday from 8:30-12:30 PM and 1:30-4:30 PM and Friday from 8:30-12:30 PM and 1:30-4:00 PM at the office of Dr. Bo Merino, [facility].   Please be advised, patients with office appointments requiring lab work will take precedents over walk-in lab work.  If possible, please come for your lab work on Monday and Friday afternoons, as you may experience shorter wait times. The office is located at [address] [place], [place], [place] [address] No appointment is necessary.   Labs are drawn by [facility]. Please bring your co-pay at the time of your lab draw.  You may receive a bill from [facility] for your lab work.  If you wish to have your labs drawn at another location, please call the office 24 hours in advance to send orders.  If you have any questions regarding directions or hours of operation,  please call [phone].   As a reminder, please drink plenty of water prior to coming for your lab work. Thanks!

## 2020-01-29 ENCOUNTER — Ambulatory Visit (INDEPENDENT_AMBULATORY_CARE_PROVIDER_SITE_OTHER): Payer: 59 | Admitting: Obstetrics and Gynecology

## 2020-01-29 ENCOUNTER — Other Ambulatory Visit: Payer: Self-pay

## 2020-01-29 ENCOUNTER — Encounter: Payer: Self-pay | Admitting: Obstetrics and Gynecology

## 2020-01-29 VITALS — BP 147/72 | HR 75 | Wt 273.0 lb

## 2020-01-29 DIAGNOSIS — R102 Pelvic and perineal pain: Secondary | ICD-10-CM

## 2020-01-29 NOTE — Progress Notes (Signed)
GYN Note  Unfortunately, it looks like the old hospital sent the EGD note and not her hysterectomy note. Pt to sign another ROI to get records and she will try and contact the prior hospital as well. Patient with no VB but continued pelvic pain.  I will follow up with her in a week. Will do no charge visit. MRI findings again d/w her.   Durene Romans MD Attending [facility] for [employer] [facility] (Faculty Practice) 01/29/2020 Time: [id number]

## 2020-02-03 ENCOUNTER — Other Ambulatory Visit: Payer: Self-pay | Admitting: Physician Assistant

## 2020-02-03 DIAGNOSIS — L4050 Arthropathic psoriasis, unspecified: Secondary | ICD-10-CM

## 2020-02-03 DIAGNOSIS — L409 Psoriasis, unspecified: Secondary | ICD-10-CM

## 2020-02-05 ENCOUNTER — Other Ambulatory Visit: Payer: Self-pay | Admitting: *Deleted

## 2020-02-05 DIAGNOSIS — Z79899 Other long term (current) drug therapy: Secondary | ICD-10-CM

## 2020-02-05 NOTE — Telephone Encounter (Signed)
Last Visit: 01/05/2020 Next Visit: 04/05/2020 Labs: 10/22/2019 Glucose 111, Albumin 3.4 TB Gold: 05/30/2019 Neg   Current Dose per office note 01/05/2020: Cosentyx 300 mg every 28 days  HI:[id number] arthritis   Patient advised she is due to update labs and will update this afternoon.   Okay to refill Cosentyx?

## 2020-02-06 ENCOUNTER — Telehealth: Payer: Self-pay | Admitting: Obstetrics and Gynecology

## 2020-02-06 LAB — COMPLETE METABOLIC PANEL WITH GFR
AG Ratio: 1.4 (calc) (ref 1.0–2.5)
ALT: 18 U/L (ref 6–29)
AST: 18 U/L (ref 10–35)
Albumin: 3.9 g/dL (ref 3.6–5.1)
Alkaline phosphatase (APISO): 104 U/L (ref 37–153)
BUN: 11 mg/dL (ref 7–25)
CO2: 32 mmol/L (ref 20–32)
Calcium: 9.4 mg/dL (ref 8.6–10.4)
Chloride: 105 mmol/L (ref 98–110)
Creat: 0.96 mg/dL (ref 0.50–1.05)
GFR, Est African American: 76 mL/min/{1.73_m2} (ref >=60)
GFR, Est Non African American: 66 mL/min/{1.73_m2} (ref >=60)
Globulin: 2.7 g/dL (calc) (ref 1.9–3.7)
Glucose, Bld: 126 mg/dL — ABNORMAL HIGH (ref 65–99)
Potassium: 4.4 mmol/L (ref 3.5–5.3)
Sodium: 142 mmol/L (ref 135–146)
Total Bilirubin: 0.3 mg/dL (ref 0.2–1.2)
Total Protein: 6.6 g/dL (ref 6.1–8.1)

## 2020-02-06 LAB — CBC WITH DIFFERENTIAL/PLATELET
Absolute Monocytes: 595 cells/uL (ref 200–950)
Basophils Absolute: 63 cells/uL (ref 0–200)
Basophils Relative: 0.9 %
Eosinophils Absolute: 154 cells/uL (ref 15–500)
Eosinophils Relative: 2.2 %
HCT: 36.4 % (ref 35.0–45.0)
Hemoglobin: 12 g/dL (ref 11.7–15.5)
Lymphs Abs: 1624 cells/uL (ref 850–3900)
MCH: 30.3 pg (ref 27.0–33.0)
MCHC: 33 g/dL (ref 32.0–36.0)
MCV: 91.9 fL (ref 80.0–100.0)
MPV: 10.5 fL (ref 7.5–12.5)
Monocytes Relative: 8.5 %
Neutro Abs: 4564 cells/uL (ref 1500–7800)
Neutrophils Relative %: 65.2 %
Platelets: 393 10*3/uL (ref 140–400)
RBC: 3.96 10*6/uL (ref 3.80–5.10)
RDW: 13 % (ref 11.0–15.0)
Total Lymphocyte: 23.2 %
WBC: 7 10*3/uL (ref 3.8–10.8)

## 2020-02-06 NOTE — Telephone Encounter (Signed)
GYN Telephone Note Nothing scanned in but I'll check the fax pile in clinic when I'm there later this week. Pt waiting on receiving a form from the hospital to fill out.   Durene Romans MD Attending [facility] [employer] [facility] (Faculty Practice) 02/06/2020 Time: [phone]

## 2020-02-25 ENCOUNTER — Other Ambulatory Visit: Payer: Self-pay | Admitting: Rheumatology

## 2020-02-25 DIAGNOSIS — L4050 Arthropathic psoriasis, unspecified: Secondary | ICD-10-CM

## 2020-02-25 DIAGNOSIS — L409 Psoriasis, unspecified: Secondary | ICD-10-CM

## 2020-02-26 NOTE — Telephone Encounter (Signed)
Last Visit: 01/05/2020 Next Visit: 04/05/2020 Labs: 02/05/2020 Glucose is 126. Rest of CMP WNL. CBC WNL. TB Gold: 05/30/2019 Neg   Current Dose per office note 01/05/2020: Cosentyx 300 mg every 28 days  SN:[id number] arthritis   Okay to refill per Dr. Estanislado Pandy

## 2020-02-29 ENCOUNTER — Encounter: Payer: Self-pay | Admitting: Neurology

## 2020-03-02 ENCOUNTER — Other Ambulatory Visit: Payer: Self-pay | Admitting: Physician Assistant

## 2020-03-08 ENCOUNTER — Telehealth: Payer: Self-pay | Admitting: *Deleted

## 2020-03-08 NOTE — Telephone Encounter (Signed)
Advised patient we received a fax from [employer] for a drug to drug interaction between Methotrexate and Meloxicam. Patient advised per Hazel Sams, PA-C advise patient to avoid taking Meloxicam while on MTX. Patient advised and expressed understanding. Patient will discontinue use.

## 2020-03-12 ENCOUNTER — Other Ambulatory Visit: Payer: Self-pay | Admitting: Physician Assistant

## 2020-03-17 ENCOUNTER — Other Ambulatory Visit: Payer: Self-pay | Admitting: Physician Assistant

## 2020-03-19 ENCOUNTER — Telehealth: Payer: Self-pay

## 2020-03-19 MED ORDER — METHOTREXATE SODIUM CHEMO INJECTION 50 MG/2ML: INTRAMUSCULAR | 0 refills | Status: DC

## 2020-03-19 NOTE — Telephone Encounter (Signed)
Last Visit:01/05/2020 Next Visit:04/05/2020 Labs:02/05/2020 Glucose is 126. Rest of CMP WNL. CBC WNL.  Current Dose per office note9/01/2020: methotrexate 0.5 mL sq injections once weekly LU:[id number] arthritis  Okay to refill per Dr. Estanislado Pandy   Patient advised prescription has been sent to the pharmacy.

## 2020-03-19 NOTE — Telephone Encounter (Signed)
Patient left a voicemail stating she received the Methotrexate syringes, but not the medication.  Patient is requesting a return call.
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
## 2020-03-28 ENCOUNTER — Other Ambulatory Visit: Payer: Self-pay

## 2020-03-28 ENCOUNTER — Encounter: Payer: Self-pay | Admitting: Neurology

## 2020-03-28 ENCOUNTER — Ambulatory Visit (INDEPENDENT_AMBULATORY_CARE_PROVIDER_SITE_OTHER): Payer: 59 | Admitting: Neurology

## 2020-03-28 VITALS — BP 160/84 | HR 88 | Ht 65.0 in | Wt 281.0 lb

## 2020-03-28 DIAGNOSIS — R202 Paresthesia of skin: Secondary | ICD-10-CM | POA: Diagnosis not present

## 2020-03-28 DIAGNOSIS — M542 Cervicalgia: Secondary | ICD-10-CM

## 2020-03-28 NOTE — Patient Instructions (Addendum)
1.  Start using a wrist brace for your right hand at night time to see if it helps your hand 2.  Nerve testing of the hands.  Do not apply oil, lotion, or cream on the day of testing 3.  Continue to use muscle relaxants and neck stretching for your headaches  Return to clinic in 3 months   [place] (EMG/NCS) INSTRUCTIONS  How to Prepare The neurologist conducting the EMG will need to know if you have certain medical conditions. Tell the neurologist and other EMG lab personnel if you: . Have a pacemaker or any other electrical medical device . Take blood-thinning medications . Have hemophilia, a blood-clotting disorder that causes prolonged bleeding Bathing Take a shower or bath shortly before your exam in order to remove oils from your skin. Don't apply lotions or creams before the exam.  What to Expect You'll likely be asked to change into a hospital gown for the procedure and lie down on an examination table. The following explanations can help you understand what will happen during the exam.  . Electrodes. The neurologist or a technician places surface electrodes at various locations on your skin depending on where you're experiencing symptoms. Or the neurologist may insert needle electrodes at different sites depending on your symptoms.  . Sensations. The electrodes will at times transmit a tiny electrical current that you may feel as a twinge or spasm. The needle electrode may cause discomfort or pain that usually ends shortly after the needle is removed. If you are concerned about discomfort or pain, you may want to talk to the neurologist about taking a short break during the exam.  . Instructions. During the needle EMG, the neurologist will assess whether there is any spontaneous electrical activity when the muscle is at rest - activity that isn't present in healthy muscle tissue - and the degree of activity when you slightly contract the muscle.  He  or she will give you instructions on resting and contracting a muscle at appropriate times. Depending on what muscles and nerves the neurologist is examining, he or she may ask you to change positions during the exam.  After your EMG You may experience some temporary, minor bruising where the needle electrode was inserted into your muscle. This bruising should fade within several days. If it persists, contact your primary care doctor.

## 2020-03-28 NOTE — Progress Notes (Signed)
[facility] Neurology Division Clinic Note - Initial Visit   Date: 03/28/20  Connie Hoffman MRN: [id number] DOB: 1961-01-01   Dear Connie Hoffman:  Thank you for your kind referral of Connie Hoffman for consultation of bilateral hand weakness. Although her history is well known to you, please allow [place] to reiterate it for the purpose of our medical record. The patient was accompanied to the clinic by self.   History of Present Illness: Connie Hoffman is a 59 y.o. right-handed female with psoriatic arthritis, GERD, and anxiety presenting for evaluation of bilateral hand weakness and numbness.    Starting early 2021, she began noticing numbness in both hands, with occasional tingling. Symptoms are noticeable when she wakes up or when she is resting.  She endorses weakness and difficulty opening jars/bottle.    She has tingling and cramping of the legs for the past 1-2 years and takes gabapentin 100mg  twice daily. She had tingling up to her knees, which is present at nighttime.  She was diagnosed with neuropathy in 2018 by NCS while living in [place].  This was ordered by her pain management provider, who she was seeing for lumbar canal stenosis. She underwent lumbar surgery which has significantly helped her low back pain and tingling in her legs.  She continues to get muscle cramps at night time. She takes methocarbamol and tramadol for pain.    She takes methotrexate and Cosentyx for psoriatic arthritis.   No history of diabetes, family history of neuropathy, or alcohol use.   She also complains of throbbing headaches over the right parietal region and base of the neck.  It usually lasts about an hour and improves. She does not takes any medication for it.  No nausea, vomiting, change in vision.   Out-side paper records, electronic medical record, and images have been reviewed where available and summarized as:  Labs 12/24/2019:  HbA1c 5.7, CRP 16, ANA neg MRI lumbar  spine wo contrast 04/05/2019: 1. Moderate degenerative disc disease at L4-L5 with mild facet arthropathy and moderate right neuroforaminal stenosis.  2. Mild disc bulge at L5-S1 with mild facet arthropathy. There is a moderate left neuroforaminal stenosis.   No interval change compared with November 22, 2017.    Lab Results  Component Value Date   TSH 0.92 12/01/2017   No results found for: ESRSEDRATE, POCTSEDRATE  Past Medical History:  Diagnosis Date  . DDD (degenerative disc disease), lumbar   . Psoriatic arthritis (HCC)   . Spinal stenosis     Past Surgical History:  Procedure Laterality Date  . BACK SURGERY  05/18/2019   L4 and L5  . CHOLECYSTECTOMY    . CYSTECTOMY  07/2017  . JOINT REPLACEMENT Left    left knee  . LAPAROSCOPIC ENDOMETRIOSIS FULGURATION  2011  . LAPAROSCOPIC GASTRIC SLEEVE RESECTION  2018  . LAPAROSCOPIC HYSTERECTOMY  2014  . LAPAROSCOPIC OOPHERECTOMY Right   . LUMBAR EPIDURAL INJECTION  04/13/2018  . REVISION TOTAL KNEE ARTHROPLASTY Left 12/2017  . TOTAL SHOULDER ARTHROPLASTY Bilateral   . TUBAL LIGATION     postpartum     Medications:  Outpatient Encounter Medications as of 03/28/2020  Medication Sig  . COSENTYX SENSOREADY, 300 MG, 150 MG/ML SOAJ INJECT 300MG  SUBCUTANEOUSLY EVERY 4 WEEKS  . diphenhydramine-acetaminophen (TYLENOL PM) 25-500 MG TABS tablet Take 1 tablet by mouth at bedtime as needed.  . DULoxetine (CYMBALTA) 30 MG capsule Take 30 mg by mouth daily.  . folic acid (FOLVITE) 1 MG tablet Take 1 mg  by mouth daily.  Marland Kitchen gabapentin (NEURONTIN) 100 MG capsule Take 100 mg by mouth 3 (three) times daily.  Marland Kitchen ketoconazole (NIZORAL) 2 % cream Apply 1 application topically daily.  . Menthol, Topical Analgesic, (BIOFREEZE) 4 % GEL Apply 1 application topically See admin instructions. Apply to painful sites as directed  . methotrexate 50 MG/2ML injection Inject 0.79mL into the skin once weekly.  . Multiple Vitamin (MULTIVITAMIN WITH MINERALS) TABS  tablet Take 1 tablet by mouth daily.  Marland Kitchen triamcinolone ointment (KENALOG) 0.1 % Apply 1 application topically 2 (two) times daily as needed (for itching). Prn   . TUBERCULIN SYR 1CC/27GX1/2" 27G X 1/2" 1 ML MISC Use 1 syringe once weekly to inject methotrexate.   No facility-administered encounter medications on file as of 03/28/2020.    Allergies:  Allergies  Allergen Reactions  . Aspirin Other (See Comments)    GI bleeding    Family History: Family History  Problem Relation Age of Onset  . Diabetes Mother   . Hypertension Mother   . Heart Problems Mother   . Congestive Heart Failure Mother   . Diabetes Father   . Hypertension Father   . Stroke Sister   . Heart Problems Sister   . Prostate cancer Brother   . Diabetes Brother   . Asthma Daughter   . Healthy Daughter   . Healthy Son   . Healthy Son     Social History: Social History   Tobacco Use  . Smoking status: Never Smoker  . Smokeless tobacco: Never Used  Vaping Use  . Vaping Use: Never used  Substance Use Topics  . Alcohol use: Never  . Drug use: Never   Social History   Social History Narrative   Right handed    Lives with husband and 2 daughters     Vital Signs:  BP (!) 160/84   Pulse 88   Ht 5\' 5"  (1.651 m)   Wt 281 lb (127.5 kg)   SpO2 99%   BMI 46.76 kg/m   Neurological Exam: MENTAL STATUS including orientation to time, place, person, recent and remote memory, attention span and concentration, language, and fund of knowledge is normal.  Speech is not dysarthric.  CRANIAL NERVES: II:  No visual field defects.   III-IV-VI: Pupils equal round and reactive to light.  Normal conjugate, extra-ocular eye movements in all directions of gaze.  No nystagmus.  No ptosis.   V:  Normal facial sensation.    VII:  Normal facial symmetry and movements.   VIII:  Normal hearing and vestibular function.   IX-X:  Normal palatal movement.   XI:  Normal shoulder shrug and head rotation.   XII:  Normal tongue  strength and range of motion, no deviation or fasciculation.  MOTOR:  No atrophy, fasciculations or abnormal movements.  No pronator drift.  *Generalized pattern of give-way weakness, with repeated effort all muscle groups are 5/5 Upper Extremity:  Right  Left  Deltoid  5/5   5/5   Biceps  5/5   5/5   Triceps  5/5   5/5   Infraspinatus 5/5  5/5  Medial pectoralis 5/5  5/5  Wrist extensors  5/5   5/5   Wrist flexors  5/5   5/5   Finger extensors  5/5   5/5   Finger flexors  5/5   5/5   Dorsal interossei  5/5   5/5   Abductor pollicis  5/5   5/5   Tone (Ashworth scale)  0  0   Lower Extremity:  Right  Left  Hip flexors  5/5   5/5   Hip extensors  5/5   5/5   Adductor 5/5  5/5  Abductor 5/5  5/5  Knee flexors  5/5   5/5   Knee extensors  5/5   5/5   Dorsiflexors  5/5   5/5   Plantarflexors  5/5   5/5   Toe extensors  5/5   5/5   Toe flexors  5/5   5/5   Tone (Ashworth scale)  0  0   MSRs:  Right        Left                  brachioradialis 2+  2+  biceps 2+  2+  triceps 2+  2+  patellar 2+  2+  ankle jerk 2+  2+  Hoffman no  no  plantar response down  down   SENSORY:  Normal and symmetric perception of light touch, pinprick, vibration, and proprioception.    COORDINATION/GAIT: Normal finger-to- nose-finger.  Intact rapid alternating movements bilaterally.  Gait narrow based and stable. Tandem and stressed gait intact.    IMPRESSION: 1.  Bilateral hand paresthesias - Ddx:  Entrapment neuropathy vs. Cervical radiculopathy  - NCS/EMG of the arms   - Start using a wrist splint at night time  2.  Tension headaches / cervicalgia  - Continue neck PT and muscle relaxants  3.  Bilateral leg paresthesias are most likely related to her lumbar spinal stenosis as her exam and improvement following surgery does not support neuropathy.   Further recommendations pending results.   Thank you for allowing me to participate in patient's care.  If I can answer any additional  questions, I would be pleased to do so.    Sincerely,    Akela Pocius K. Allena Katz, DO

## 2020-04-01 ENCOUNTER — Ambulatory Visit: Payer: 59 | Admitting: Neurology

## 2020-04-05 ENCOUNTER — Other Ambulatory Visit: Payer: Self-pay

## 2020-04-05 ENCOUNTER — Other Ambulatory Visit: Payer: Self-pay | Admitting: *Deleted

## 2020-04-05 ENCOUNTER — Encounter: Payer: Self-pay | Admitting: Physician Assistant

## 2020-04-05 ENCOUNTER — Ambulatory Visit (INDEPENDENT_AMBULATORY_CARE_PROVIDER_SITE_OTHER): Payer: 59 | Admitting: Physician Assistant

## 2020-04-05 ENCOUNTER — Telehealth: Payer: Self-pay | Admitting: Obstetrics and Gynecology

## 2020-04-05 VITALS — BP 131/81 | HR 80 | Ht 65.0 in | Wt 286.0 lb

## 2020-04-05 DIAGNOSIS — Z96652 Presence of left artificial knee joint: Secondary | ICD-10-CM

## 2020-04-05 DIAGNOSIS — M19041 Primary osteoarthritis, right hand: Secondary | ICD-10-CM | POA: Diagnosis not present

## 2020-04-05 DIAGNOSIS — Z79899 Other long term (current) drug therapy: Secondary | ICD-10-CM

## 2020-04-05 DIAGNOSIS — M5136 Other intervertebral disc degeneration, lumbar region: Secondary | ICD-10-CM

## 2020-04-05 DIAGNOSIS — L4050 Arthropathic psoriasis, unspecified: Secondary | ICD-10-CM

## 2020-04-05 DIAGNOSIS — M19042 Primary osteoarthritis, left hand: Secondary | ICD-10-CM

## 2020-04-05 DIAGNOSIS — Z9884 Bariatric surgery status: Secondary | ICD-10-CM

## 2020-04-05 DIAGNOSIS — D75839 Thrombocytosis, unspecified: Secondary | ICD-10-CM

## 2020-04-05 DIAGNOSIS — M1711 Unilateral primary osteoarthritis, right knee: Secondary | ICD-10-CM

## 2020-04-05 DIAGNOSIS — L409 Psoriasis, unspecified: Secondary | ICD-10-CM

## 2020-04-05 DIAGNOSIS — Z111 Encounter for screening for respiratory tuberculosis: Secondary | ICD-10-CM

## 2020-04-05 DIAGNOSIS — M19071 Primary osteoarthritis, right ankle and foot: Secondary | ICD-10-CM

## 2020-04-05 DIAGNOSIS — M19072 Primary osteoarthritis, left ankle and foot: Secondary | ICD-10-CM

## 2020-04-05 DIAGNOSIS — R296 Repeated falls: Secondary | ICD-10-CM

## 2020-04-05 DIAGNOSIS — R202 Paresthesia of skin: Secondary | ICD-10-CM

## 2020-04-05 MED ORDER — COSENTYX SENSOREADY (300 MG) 150 MG/ML ~~LOC~~ SOAJ: 150.0000 mg | SUBCUTANEOUS | 0 refills | Status: DC

## 2020-04-05 NOTE — Telephone Encounter (Signed)
GYN Telephone Note  Still no records and patient still endorsing mild low belly discomfort. Pt getting OSA and colonoscopy testing soon. I told her let's meet in late January because based on the MRI and her s/s, I would recommend at least a diagnostic laparoscopy to see what is in her abdomen and pelvis and potentially more but I definitely want her colonoscopy to be UTD.   Pt amenable to plan. In basket request sent to office for appt  Durene Romans MD [facility]ulty [facility] 04/05/2020 Time: [phone]

## 2020-04-05 NOTE — Patient Instructions (Addendum)
Standing Labs We placed an order today for your standing lab work.   Please have your standing labs drawn in January and every 3 months   If possible, please have your labs drawn 2 weeks prior to your appointment so that the provider can discuss your results at your appointment.  We have open lab daily Monday through Thursday from 8:30-12:30 PM and 1:30-4:30 PM and Friday from 8:30-12:30 PM and 1:30-4:00 PM at the office of Dr. Bo Merino, [facility].   Please be advised, patients with office appointments requiring lab work will take precedents over walk-in lab work.  If possible, please come for your lab work on Monday and Friday afternoons, as you may experience shorter wait times. The office is located at [address] [place], [place], [place] [address] No appointment is necessary.   Labs are drawn by [facility]. Please bring your co-pay at the time of your lab draw.  You may receive a bill from [facility] for your lab work.  If you wish to have your labs drawn at another location, please call the office 24 hours in advance to send orders.  If you have any questions regarding directions or hours of operation,  please call [phone].   As a reminder, please drink plenty of water prior to coming for your lab work. Thanks!  COVID-19 vaccine recommendations:   COVID-19 vaccine is recommended for everyone (unless you are allergic to a vaccine component), even if you are on a medication that suppresses your immune system.   If you are on Methotrexate, Cellcept (mycophenolate), Rinvoq, Morrie Sheldon, and Olumiant- hold the medication for 1 week after each vaccine. Hold Methotrexate for 2 weeks after the single dose COVID-19 vaccine.   If you are on Orencia subcutaneous injection - hold medication one week prior to and one week after the first COVID-19 vaccine dose (only).   If you are on Orencia IV infusions- time vaccination administration so that the first COVID-19 vaccination  will occur four weeks after the infusion and postpone the subsequent infusion by one week.   If you are on Cyclophosphamide or Rituxan infusions please contact your doctor prior to receiving the COVID-19 vaccine.   Do not take Tylenol or any anti-inflammatory medications (NSAIDs) 24 hours prior to the COVID-19 vaccination.   There is no direct evidence about the efficacy of the COVID-19 vaccine in individuals who are on medications that suppress the immune system.   Even if you are fully vaccinated, and you are on any medications that suppress your immune system, please continue to wear a mask, maintain at least six feet social distance and practice hand hygiene.   If you develop a COVID-19 infection, please contact your PCP or our office to determine if you need monoclonal antibody infusion.  The booster vaccine is now available for immunocompromised patients.   Please see the following web sites for updated information.   [email or web address]

## 2020-04-08 ENCOUNTER — Telehealth: Payer: Self-pay | Admitting: Pharmacy Technician

## 2020-04-08 NOTE — Telephone Encounter (Signed)
Received notification from [facility] regarding a prior authorization for [place]. Authorization has been APPROVED from 04/27/20 to 04/26/21.   Authorization # [id number]

## 2020-04-17 ENCOUNTER — Other Ambulatory Visit: Payer: Self-pay | Admitting: Adult Medicine

## 2020-04-17 DIAGNOSIS — M542 Cervicalgia: Secondary | ICD-10-CM

## 2020-05-01 ENCOUNTER — Other Ambulatory Visit: Payer: Self-pay | Admitting: *Deleted

## 2020-05-01 DIAGNOSIS — Z111 Encounter for screening for respiratory tuberculosis: Secondary | ICD-10-CM

## 2020-05-01 DIAGNOSIS — Z79899 Other long term (current) drug therapy: Secondary | ICD-10-CM

## 2020-05-02 NOTE — Progress Notes (Signed)
Platelet count is borderline elevated. Rest of CBC WNL.  Potassium is elevated-5.6. rest of CMP WNL.  Please forward lab work to PCP.

## 2020-05-03 ENCOUNTER — Telehealth: Payer: Self-pay

## 2020-05-03 LAB — COMPLETE METABOLIC PANEL WITH GFR
AG Ratio: 1.5 (calc) (ref 1.0–2.5)
ALT: 19 U/L (ref 6–29)
AST: 20 U/L (ref 10–35)
Albumin: 4 g/dL (ref 3.6–5.1)
Alkaline phosphatase (APISO): 97 U/L (ref 37–153)
BUN: 11 mg/dL (ref 7–25)
CO2: 29 mmol/L (ref 20–32)
Calcium: 9.2 mg/dL (ref 8.6–10.4)
Chloride: 105 mmol/L (ref 98–110)
Creat: 0.66 mg/dL (ref 0.50–1.05)
GFR, Est African American: 114 mL/min/{1.73_m2} (ref >=60)
GFR, Est Non African American: 98 mL/min/{1.73_m2} (ref >=60)
Globulin: 2.7 g/dL (calc) (ref 1.9–3.7)
Glucose, Bld: 108 mg/dL — ABNORMAL HIGH (ref 65–99)
Potassium: 5.6 mmol/L — ABNORMAL HIGH (ref 3.5–5.3)
Sodium: 144 mmol/L (ref 135–146)
Total Bilirubin: 0.3 mg/dL (ref 0.2–1.2)
Total Protein: 6.7 g/dL (ref 6.1–8.1)

## 2020-05-03 LAB — CBC WITH DIFFERENTIAL/PLATELET
Absolute Monocytes: 454 cells/uL (ref 200–950)
Basophils Absolute: 38 cells/uL (ref 0–200)
Basophils Relative: 0.7 %
Eosinophils Absolute: 162 cells/uL (ref 15–500)
Eosinophils Relative: 3 %
HCT: 37.6 % (ref 35.0–45.0)
Hemoglobin: 12.4 g/dL (ref 11.7–15.5)
Lymphs Abs: 1382 cells/uL (ref 850–3900)
MCH: 29.7 pg (ref 27.0–33.0)
MCHC: 33 g/dL (ref 32.0–36.0)
MCV: 90 fL (ref 80.0–100.0)
MPV: 10.2 fL (ref 7.5–12.5)
Monocytes Relative: 8.4 %
Neutro Abs: 3364 cells/uL (ref 1500–7800)
Neutrophils Relative %: 62.3 %
Platelets: 407 10*3/uL — ABNORMAL HIGH (ref 140–400)
RBC: 4.18 10*6/uL (ref 3.80–5.10)
RDW: 13.4 % (ref 11.0–15.0)
Total Lymphocyte: 25.6 %
WBC: 5.4 10*3/uL (ref 3.8–10.8)

## 2020-05-03 LAB — QUANTIFERON-TB GOLD PLUS
Mitogen-NIL: >10 IU/mL
NIL: 0.03 IU/mL
QuantiFERON-TB Gold Plus: NEGATIVE
TB1-NIL: 0 IU/mL
TB2-NIL: 0 IU/mL

## 2020-05-03 NOTE — Telephone Encounter (Signed)
Returned the call and advised patient of TB gold lab result, patient verbalized understanding.

## 2020-05-03 NOTE — Telephone Encounter (Signed)
Patient called stating she was returning your call.   °

## 2020-05-08 ENCOUNTER — Other Ambulatory Visit: Payer: Self-pay

## 2020-05-08 ENCOUNTER — Ambulatory Visit (INDEPENDENT_AMBULATORY_CARE_PROVIDER_SITE_OTHER): Payer: Medicare Other | Admitting: Neurology

## 2020-05-08 DIAGNOSIS — R202 Paresthesia of skin: Secondary | ICD-10-CM | POA: Diagnosis not present

## 2020-05-08 DIAGNOSIS — G5623 Lesion of ulnar nerve, bilateral upper limbs: Secondary | ICD-10-CM

## 2020-05-08 DIAGNOSIS — M542 Cervicalgia: Secondary | ICD-10-CM | POA: Diagnosis not present

## 2020-05-08 NOTE — Progress Notes (Signed)
Follow-up Visit   Date: 05/08/20   Connie Hoffman MRN: [id number] DOB: [birth year]   Interim History: Connie Hoffman is a 60 y.o. right-handed female with psoriatic arthritis, GERD, and anxiety returning to the clinic for follow-up of bilateral hand weakness and numbness.  The patient was accompanied to the clinic by self.  History of present illness: Starting early 2021, she began noticing numbness in both hands, with occasional tingling. Symptoms are noticeable when she wakes up or when she is resting.  She endorses weakness and difficulty opening jars/bottle.    She has tingling and cramping of the legs for the past 1-2 years and takes gabapentin 100mg  twice daily. She had tingling up to her knees, which is present at nighttime.  She was diagnosed with neuropathy in 2018 by [facility] while living in [place].  This was ordered by her pain management provider, who she was seeing for lumbar canal stenosis. She underwent lumbar surgery which has significantly helped her low back pain and tingling in her legs.  She continues to get muscle cramps at night time. She takes methocarbamol and tramadol for pain.    She takes methotrexate and Cosentyx for psoriatic arthritis.   UPDATE 05/08/2020:  She is here for EDX of the arms.  No new changes to her symptoms.   Medications:  Current Outpatient Medications on File Prior to Visit  Medication Sig Dispense Refill  . diphenhydramine-acetaminophen (TYLENOL PM) 25-500 MG TABS tablet Take 1 tablet by mouth at bedtime as needed.    . DULoxetine (CYMBALTA) 30 MG capsule Take 30 mg by mouth daily.    . folic acid (FOLVITE) 1 MG tablet Take 1 mg by mouth daily.    Marland Kitchen gabapentin (NEURONTIN) 100 MG capsule Take 100 mg by mouth 3 (three) times daily.    Marland Kitchen ketoconazole (NIZORAL) 2 % cream Apply 1 application topically daily. 15 g 0  . Menthol, Topical Analgesic, (BIOFREEZE) 4 % GEL Apply 1 application topically See admin instructions. Apply to painful  sites as directed    . methotrexate 50 MG/2ML injection Inject 0.53mL into the skin once weekly. 6 mL 0  . Multiple Vitamin (MULTIVITAMIN WITH MINERALS) TABS tablet Take 1 tablet by mouth daily.    . Secukinumab, 300 MG Dose, (COSENTYX SENSOREADY, 300 MG,) 150 MG/ML SOAJ Inject 150 mg into the skin every 14 (fourteen) days. 6 mL 0  . triamcinolone ointment (KENALOG) 0.1 % Apply 1 application topically 2 (two) times daily as needed (for itching). Prn   0  . TUBERCULIN SYR 1CC/27GX1/2" 27G X 1/2" 1 ML MISC Use 1 syringe once weekly to inject methotrexate. 12 each 2   No current facility-administered medications on file prior to visit.    Allergies:  Allergies  Allergen Reactions  . Aspirin Other (See Comments)    GI bleeding    Vital Signs:  There were no vitals taken for this visit.   Exam deferred  Data: NCS/EMG of the arms 05/08/2020: 1. Bilateral ulnar neuropathy with slowing across the elbow, predominantly demyelinating, mild-to-moderate and worse on the right. 2. There is no evidence of a cervical radiculopathy or carpal tunnel syndrome affecting either upper extremity  IMPRESSION/PLAN: Bilateral ulnar neuropathy across the elbow, worse on the right.  Strategies to avoid nerve compression at the elbow discussed including avoidance of over bending at the elbow and leaning the elbow on hard surfaces Patient encouraged to be mindful of arm positioning during sleep, especially since she prefers to lay on her  side with the arm flexed  Return to clinic as needed  Thank you for allowing me to participate in patient's care.  If I can answer any additional questions, I would be pleased to do so.    Sincerely,    Karter Haire K. Allena Katz, DO

## 2020-05-08 NOTE — Procedures (Signed)
[facility] Neurology  [address], [place] [address]  [place], [place] [address] Tel: [phone] Fax:  [phone] Test Date:  05/08/2020  Patient: Connie Hoffman DOB: 11/19/1960 Physician: Narda Amber, DO  Sex: Female Height: 5\' 5"  Ref Phys: Narda Amber, DO  ID#: [id number]   Technician:    Patient Complaints: This is a 60 year old female referred for evaluation of bilateral hand paresthesias.  NCV & EMG Findings: Extensive electrodiagnostic testing of the right upper extremity and additional studies of the left shows:  1. Bilateral median, mixed palmar, and ulnar sensory responses are within normal limits. 2. Bilateral median motor responses are within normal limits.  Right ulnar motor response shows reduced amplitude (6.4 mV) and decreased conduction velocity (A Elbow-B Elbow, 37 m/s).  Left ulnar motor response shows decreased conduction velocity (A Elbow-B Elbow, 43 m/s).  3. Chronic motor axonal loss changes are seen affecting the right ulnar innervated muscles, without accompanied active denervation.  These findings are not present in the left upper extremity.   Impression: 1. Bilateral ulnar neuropathy with slowing across the elbow, predominantly demyelinating, mild-to-moderate and worse on the right. 2. There is no evidence of a cervical radiculopathy or carpal tunnel syndrome affecting either upper extremity   ___________________________ Narda Amber, DO    Nerve Conduction Studies Anti Sensory Summary Table   Stim Site NR Peak (ms) Norm Peak (ms) P-T Amp (V) Norm P-T Amp  Left Median Anti Sensory (2nd Digit)  35C  Wrist    3.2 <3.6 43.8 >15  Right Median Anti Sensory (2nd Digit)  35C  Wrist    3.0 <3.6 38.8 >15  Left Ulnar Anti Sensory (5th Digit)  35C  Wrist    3.1 <3.1 40.3 >10  Right Ulnar Anti Sensory (5th Digit)  35C  Wrist    2.8 <3.1 41.7 >10   Motor Summary Table   Stim Site NR Onset (ms) Norm Onset (ms) O-P Amp (mV) Norm O-P Amp Site1 Site2  Delta-0 (ms) Dist (cm) Vel (m/s) Norm Vel (m/s)  Left Median Motor (Abd Poll Brev)  35C  Wrist    3.1 <4.0 8.8 >6 Elbow Wrist 5.1 29.0 57 >50  Elbow    8.2  8.5         Right Median Motor (Abd Poll Brev)  35C  Wrist    2.9 <4.0 9.9 >6 Elbow Wrist 4.5 29.0 64 >50  Elbow    7.4  9.4         Left Ulnar Motor (Abd Dig Minimi)  35C  Wrist    2.1 <3.1 7.1 >7 B Elbow Wrist 4.2 24.0 57 >50  B Elbow    6.3  6.6  A Elbow B Elbow 2.3 10.0 43 >50  A Elbow    8.6  6.2         Right Ulnar Motor (Abd Dig Minimi)  35C  Wrist    2.4 <3.1 6.4 >7 B Elbow Wrist 3.7 24.0 65 >50  B Elbow    6.1  5.8  A Elbow B Elbow 2.7 10.0 37 >50  A Elbow    8.8  5.4          Comparison Summary Table   Stim Site NR Peak (ms) Norm Peak (ms) P-T Amp (V) Site1 Site2 Delta-P (ms) Norm Delta (ms)  Left Median/Ulnar Palm Comparison (Wrist - 8cm)  35C  Median Palm    1.8 <2.2 61.6 Median Palm Ulnar Palm 0.1   Ulnar [employer]  1.7 <2.2 23.7      Right Median/Ulnar Palm Comparison (Wrist - 8cm)  35C  Median Palm    1.7 <2.2 42.6 Median Palm Ulnar Palm 0.1   Ulnar Palm    1.6 <2.2 20.5       EMG   Side Muscle Ins Act Fibs Psw Fasc Number Recrt Dur Dur. Amp Amp. Poly Poly. Comment  Right 1stDorInt Nml Nml Nml Nml 1- Rapid Some 1+ Some 1+ Nml Nml N/A  Right Abd Poll Brev Nml Nml Nml Nml Nml Nml Nml Nml Nml Nml Nml Nml N/A  Right FlexPolLong Nml Nml Nml Nml Nml Nml Nml Nml Nml Nml Nml Nml N/A  Right Ext Indicis Nml Nml Nml Nml Nml Nml Nml Nml Nml Nml Nml Nml N/A  Right PronatorTeres Nml Nml Nml Nml Nml Nml Nml Nml Nml Nml Nml Nml N/A  Right Biceps Nml Nml Nml Nml Nml Nml Nml Nml Nml Nml Nml Nml N/A  Right Triceps Nml Nml Nml Nml Nml Nml Nml Nml Nml Nml Nml Nml N/A  Right Deltoid Nml Nml Nml Nml Nml Nml Nml Nml Nml Nml Nml Nml N/A  Right ABD Dig Min Nml Nml Nml Nml 1- Rapid Some 1+ Some 1+ Nml Nml N/A  Right FlexCarpiUln Nml Nml Nml Nml 1- Rapid Some 1+ Some 1+ Nml Nml N/A  Left 1stDorInt Nml Nml Nml Nml Nml Nml Nml Nml Nml  Nml Nml Nml N/A  Left PronatorTeres Nml Nml Nml Nml Nml Nml Nml Nml Nml Nml Nml Nml N/A  Left Biceps Nml Nml Nml Nml Nml Nml Nml Nml Nml Nml Nml Nml N/A  Left Triceps Nml Nml Nml Nml Nml Nml Nml Nml Nml Nml Nml Nml N/A  Left Deltoid Nml Nml Nml Nml Nml Nml Nml Nml Nml Nml Nml Nml N/A  Left FlexCarpiUln Nml Nml Nml Nml Nml Nml Nml Nml Nml Nml Nml Nml N/A      Waveforms:

## 2020-05-09 ENCOUNTER — Other Ambulatory Visit: Payer: Self-pay | Admitting: Rheumatology

## 2020-05-09 ENCOUNTER — Ambulatory Visit
Admission: RE | Admit: 2020-05-09 | Discharge: 2020-05-09 | Disposition: A | Discharge status: Home / Self Care | Payer: Medicare Other | Source: Ambulatory Visit | Attending: Adult Medicine | Admitting: Adult Medicine

## 2020-05-09 DIAGNOSIS — M542 Cervicalgia: Secondary | ICD-10-CM

## 2020-05-09 IMAGING — MR MR CERVICAL SPINE WO/W CM
5 of 8 series · 26 of 48 positions shown · IV contrast (multihance)
Comparison: None.

CLINICAL DATA: Neck pain with right-sided radiculopathy for 9
months

EXAM:
MRI CERVICAL SPINE WITHOUT AND WITH CONTRAST
TECHNIQUE: Multiplanar and multiecho pulse sequences of the cervical spine, to
include the craniocervical junction and cervicothoracic junction,
were obtained without and with intravenous contrast.
CONTRAST:  20mL MULTIHANCE GADOBENATE DIMEGLUMINE 529 MG/ML IV SOLN

[Series 3: T2 · sagittal · 3.0mm · 0.66mm/px · 2 of 12 slices shown (1 of 2)]
[im 1/12]
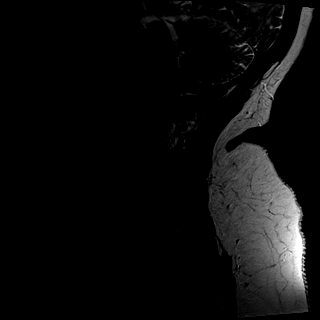
[im 12/12]
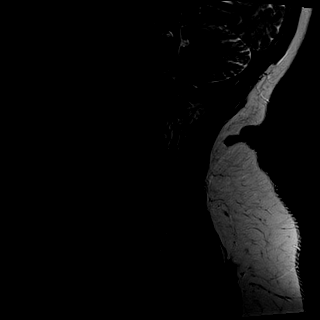

[Series 4: T1 · sagittal · 3.0mm · 0.41mm/px · 3 of 12 slices shown (1 of 3)]
[im 1/12]
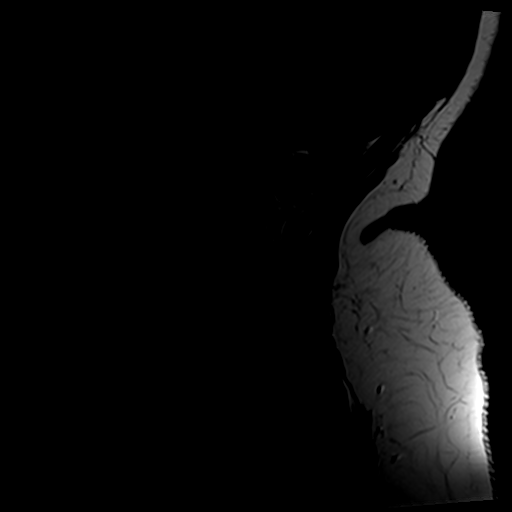
[im 6/12]
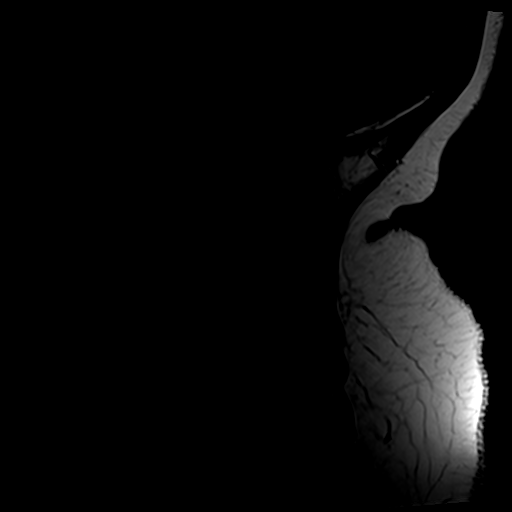
[im 12/12]
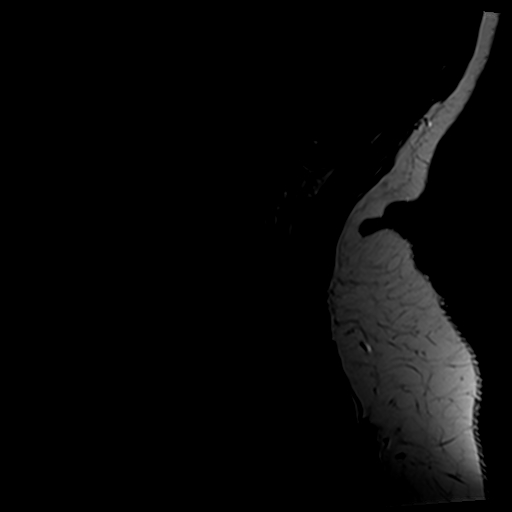

[Series 7: T2 · axial · 3.0mm · 0.70mm/px · z∈[-93,+15]mm · 9 of 30 slices shown (2 of 2)]
[im 1/30]
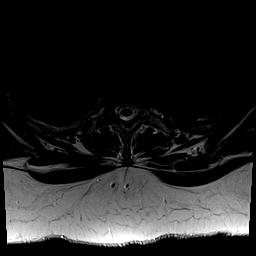
[im 4/30]
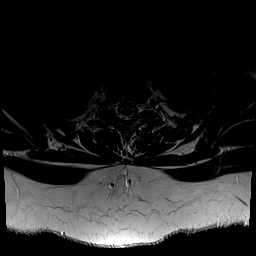
[im 8/30]
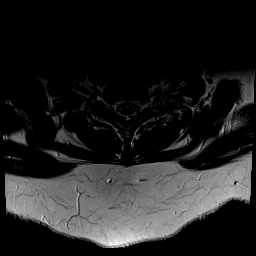
[im 11/30]
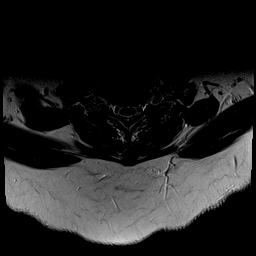
[im 15/30]
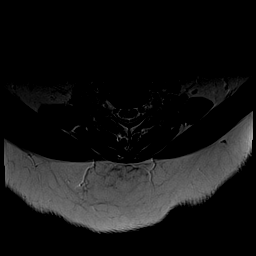
[im 19/30]
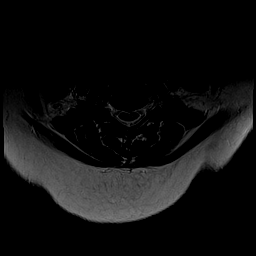
[im 22/30]
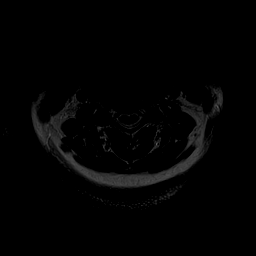
[im 26/30]
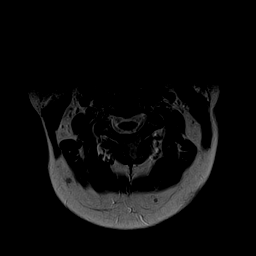
[im 30/30]
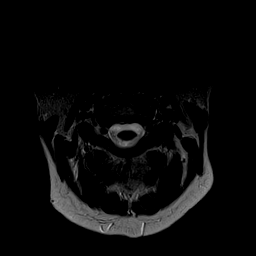

[Series 8: T1 · axial · 3.0mm · 0.35mm/px · z∈[-93,+15]mm · 9 of 30 slices shown (2 of 3)]
[im 1/30]
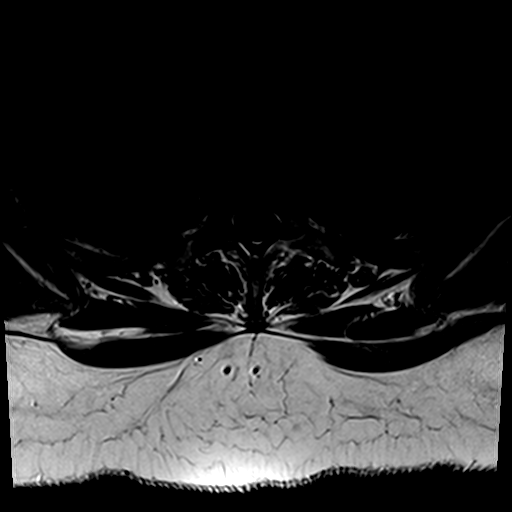
[im 4/30]
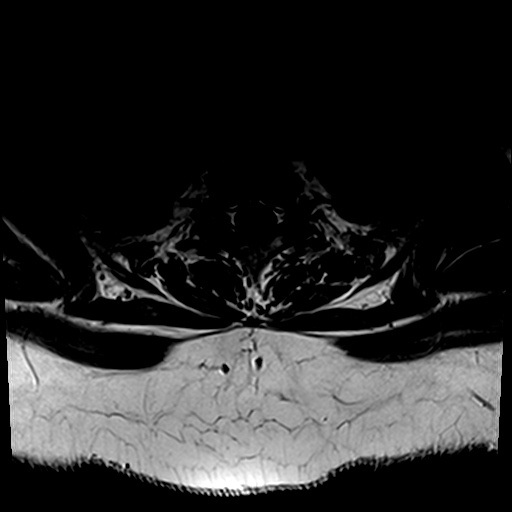
[im 8/30]
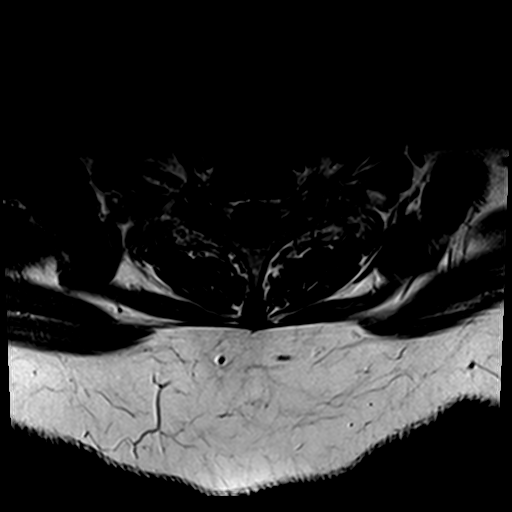
[im 11/30]
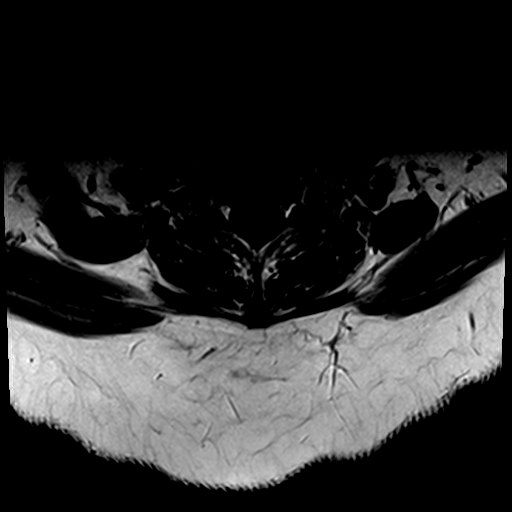
[im 15/30]
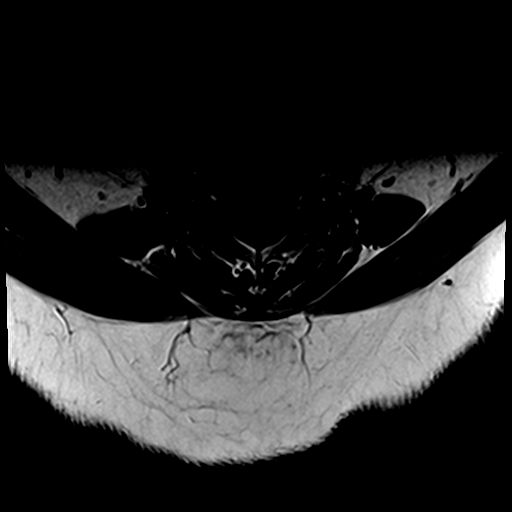
[im 19/30]
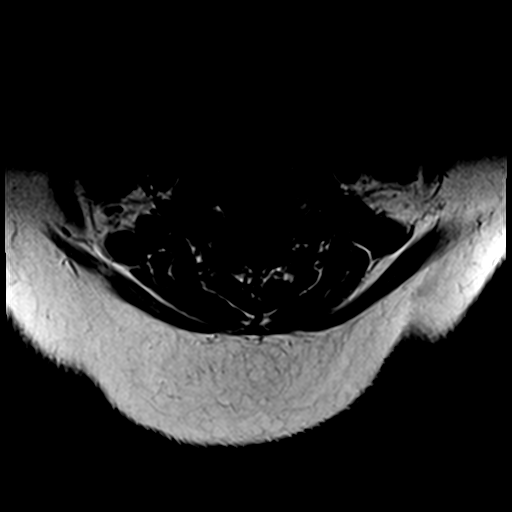
[im 22/30]
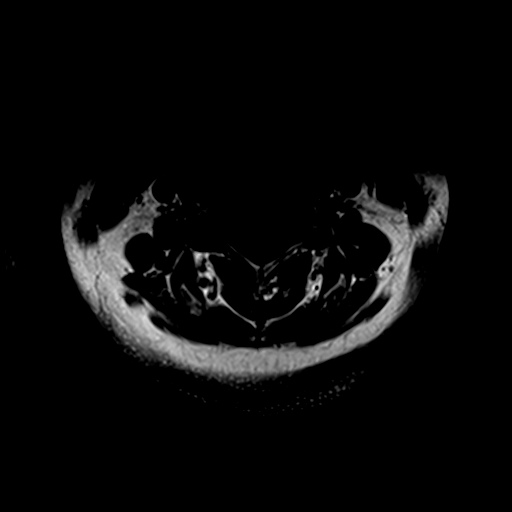
[im 26/30]
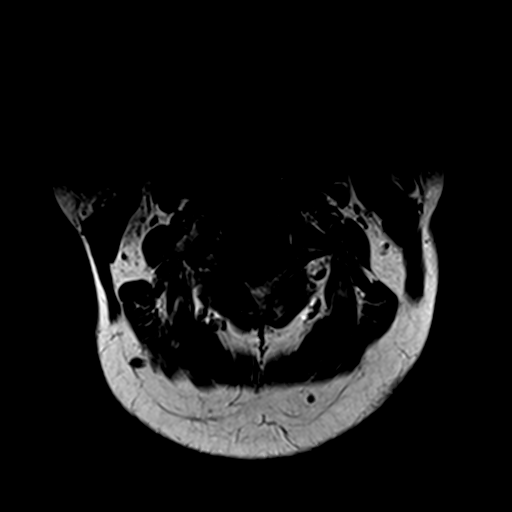
[im 30/30]
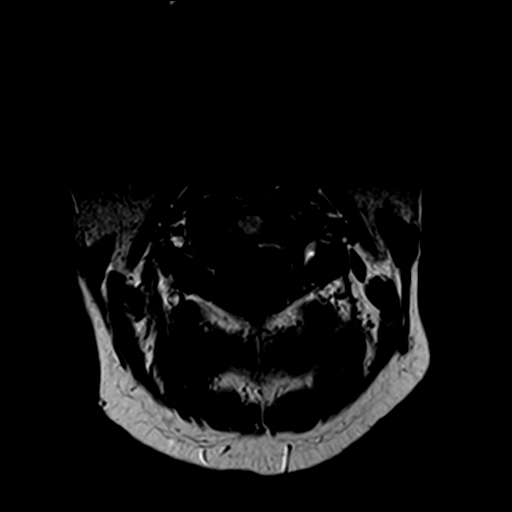

[Series 9: T1 · axial · 3.0mm · 0.35mm/px · z∈[-93,-67]mm · 3 of 30 slices shown (3 of 3)]
[im 1/30]
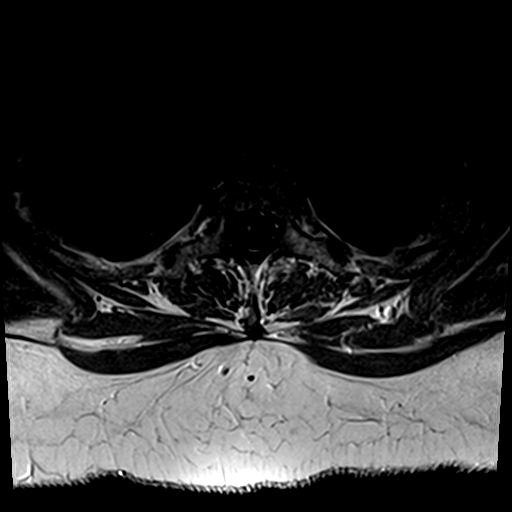
[im 4/30]
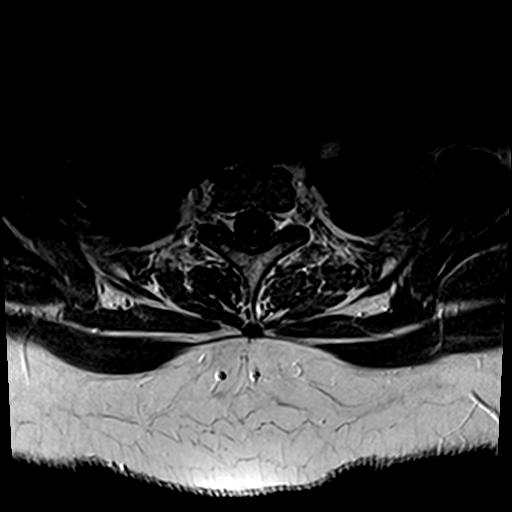
[im 8/30]
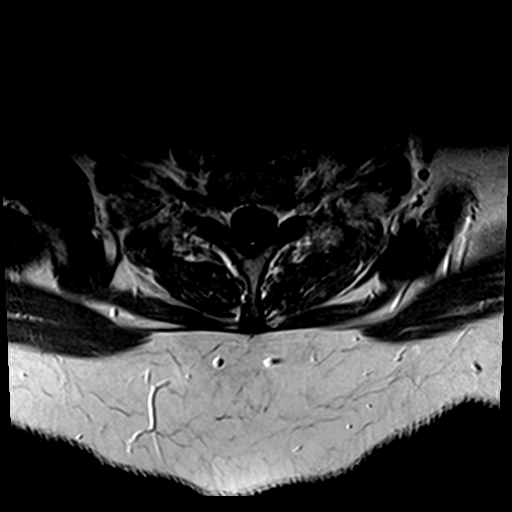

[26 of 48 positions shown; findings below may reference images not displayed]

FINDINGS: Alignment: Straightening of the cervical lordosis without static
listhesis.

Vertebrae: No fracture, evidence of discitis, or bone lesion.

Cord: Normal signal and morphology.

Posterior Fossa, vertebral arteries, paraspinal tissues: Negative.

Disc levels:

C2-C3: No significant disc protrusion, foraminal stenosis, or canal
stenosis.

C3-C4: No significant disc protrusion, foraminal stenosis, or canal
stenosis.

C4-C5: No significant disc protrusion, foraminal stenosis, or canal
stenosis.

C5-C6: Minimal posterior disc osteophyte complex, slightly eccentric
to the right, contacting the ventral cord without significant canal
stenosis. Bilateral foramina are patent.

C6-C7: Minimal disc osteophyte complex without foraminal or canal
stenosis.

C7-T1: No significant disc protrusion, foraminal stenosis, or canal
stenosis.
IMPRESSION: 1. No acute osseous abnormality or abnormal cord signal.
2. Minimal degenerative changes of the cervical spine. No
significant foraminal or canal stenosis at any level.

## 2020-05-09 MED ORDER — GADOBENATE DIMEGLUMINE 529 MG/ML IV SOLN
20.0000 mL | Freq: Once | INTRAVENOUS | Status: AC | PRN
  Administered 2020-05-09: 20 mL via INTRAVENOUS

## 2020-05-12 ENCOUNTER — Other Ambulatory Visit: Payer: 59

## 2020-05-14 ENCOUNTER — Telehealth: Payer: Self-pay | Admitting: Rheumatology

## 2020-05-14 NOTE — Telephone Encounter (Signed)
Spoke with pharmacist and advised the MTX is to early to fill and the Triamcinolone cream is not prescribed by our office.

## 2020-05-14 NOTE — Telephone Encounter (Signed)
Pharmacy calling in reference to rx for MTX, and Triamcinolone ointment. Both have been declined thru insurance. Please call with direction, and reason for denial. Please use reference # [id number].

## 2020-05-16 ENCOUNTER — Other Ambulatory Visit: Payer: Self-pay | Admitting: Rheumatology

## 2020-05-16 NOTE — Telephone Encounter (Signed)
Last Visit: 04/15/2020 Next Visit: 07/02/2020 Labs: 05/01/2020, Platelet count is borderline elevated. Rest of CBC WNL. Potassium is elevated-5.6. rest of CMP WNL.  TB Gold: 05/01/2020, negative  Current Dose per office note 04/05/2020, Cosentyx 150 mg sq injections once every 14 days  VU:[id number] arthritis  Okay to refill Cosenytx?

## 2020-05-22 ENCOUNTER — Telehealth (INDEPENDENT_AMBULATORY_CARE_PROVIDER_SITE_OTHER): Payer: Medicare Other | Admitting: Obstetrics and Gynecology

## 2020-05-22 ENCOUNTER — Encounter: Payer: Self-pay | Admitting: Obstetrics and Gynecology

## 2020-05-22 DIAGNOSIS — N8111 Cystocele, midline: Secondary | ICD-10-CM | POA: Diagnosis not present

## 2020-05-22 NOTE — Progress Notes (Signed)
I connected with  [name] on 05/22/20 at  1:15 PM EST by telephone and verified that I am speaking with the correct person using two identifiers.   I discussed the limitations, risks, security and privacy concerns of performing an evaluation and management service by telephone and the availability of in person appointments. I also discussed with the patient that there may be a patient responsible charge related to this service. The patient expressed understanding and agreed to proceed.  Annabell Howells, RN 05/22/2020  1:19 PM

## 2020-05-22 NOTE — Progress Notes (Signed)
TELEHEALTH VIRTUAL GYNECOLOGY VISIT ENCOUNTER NOTE  I connected with Connie Hoffman on 05/22/20 at  1:15 PM EST by telephone at home and verified that I am speaking with the correct person using two identifiers.   I discussed the limitations, risks, security and privacy concerns of performing an evaluation and management service by telephone and the availability of in person appointments. I also discussed with the patient that there may be a patient responsible charge related to this service. The patient expressed understanding and agreed to proceed.  Chief Complaint: follow up mri findings and pelvic s/s  History:  Connie Hoffman is a 60 y.o. G3P3 being evaluated today for above CC.   Interval History Patient and our office still unable to obtain any records from [place].  Patient states still having some lower pelvic pressure and ? Bulge like s/s. No VB, itching or discharge   HPI: Patient intially seen by me on 11/2019 as ED referral for pelvic masses seen on imaging. She states that she had a hysterectomy in [place] but imaging showed what looked like a cervix and uterus.    Past Medical History:  Diagnosis Date  . DDD (degenerative disc disease), lumbar   . Psoriatic arthritis ([place])   . Spinal stenosis    Past Surgical History:  Procedure Laterality Date  . BACK SURGERY  05/18/2019   L4 and L5  . CHOLECYSTECTOMY    . CYSTECTOMY  07/2017  . JOINT REPLACEMENT Left    left knee  . LAPAROSCOPIC ENDOMETRIOSIS FULGURATION  2011  . [place] RESECTION  2018  . LAPAROSCOPIC HYSTERECTOMY  2014  . LAPAROSCOPIC OOPHERECTOMY Right   . LUMBAR EPIDURAL INJECTION  04/13/2018  . REVISION TOTAL KNEE ARTHROPLASTY Left 12/2017  . TOTAL SHOULDER ARTHROPLASTY Bilateral   . TUBAL LIGATION     postpartum   The following portions of the patient's history were reviewed and updated as appropriate: allergies, current medications, past family history, past medical history, past  social history, past surgical history and problem list.   Review of Systems:  Pertinent items noted in HPI and remainder of comprehensive ROS otherwise negative.  Physical Exam:   General:  Alert, oriented and cooperative.   Mental Status: Normal mood and affect perceived. Normal judgment and thought content.  Physical exam deferred due to nature of the encounter  Labs and Imaging  Pap neg 2021  Narrative & Impression  CLINICAL DATA:  Pelvic mass on CT in a patient that is reportedly status post laparoscopic hysterectomy in 2014.  EXAM: MRI PELVIS WITHOUT AND WITH CONTRAST  TECHNIQUE: Multiplanar multisequence MR imaging of the pelvis was performed both before and after administration of intravenous contrast.  CONTRAST:  14mL GADAVIST GADOBUTROL 1 MMOL/ML IV SOLN  COMPARISON:  CT abdomen/pelvis dated 10/22/2019 and 10/13/2017.  FINDINGS: Urinary Tract:  Low lying bladder, underdistended.  Bowel: Scattered mild sigmoid diverticulosis, without evidence of diverticulitis.  Vascular/Lymphatic: No evidence of aneurysm.  No suspicious pelvic lymphadenopathy.  Reproductive: Despite the reported history of laparoscopic hysterectomy, the patient has a uterus in situ, corresponding to the CT abnormality. Uterus measures 4.3 x 4.7 x 5.7 cm. Multiple uterine fibroids, approximately 6-7 in number, including a dominant 2.1 cm subserosal fibroid in the right posterior uterine body (series 5/image 21). All fibroids enhance following contrast administration. Endometrial complex measures 4 mm, within normal limits.  No adnexal masses.  Other:  No pelvic ascites.  Musculoskeletal: Degenerative changes at L5-S1.  IMPRESSION: Fibroid uterus in situ, as described  above, corresponding to the CT abnormality.   Electronically Signed   By: Julian Hy M.D.   On: 12/15/2019 15:30       Narrative & Impression  CLINICAL DATA:  Mass seen on CT.  History of a  hysterectomy.  EXAM: ULTRASOUND PELVIS TRANSVAGINAL  TECHNIQUE: Transvaginal ultrasound examination of the pelvis was performed including evaluation of the uterus, ovaries, adnexal regions, and pelvic cul-de-sac.  COMPARISON:  None.  FINDINGS: Uterus  Measurements: 5.1 x 3.2 x 4.0 cm = volume: There is 33.9 mL. Heterogeneous echogenicity. Evidence of fibroids, 1 arising from the anterior uterine segment, subserosal, 1.7 x 1.4 x 1.7 cm, 1 suggested from the cervix, lower uterine segment, 1.5 x 1.3 x 1.3 cm. Another from the right uterine body, mural, 1.3 x 1.7 x 1.3 cm.  Endometrium  Not well visualized.  No evidence of thickening.  Right ovary  Not visualized.  No adnexal masses.  Left ovary  Not visualized.  No adnexal masses  Other findings:  No abnormal free fluid  IMPRESSION: 1. Although history was provided stating a hysterectomy, there is a uterus, as was noted on the current CT. There are small uterine fibroids as detailed, also evident on the earlier CT. 2. No acute findings. Neither ovary visualized. No adnexal masses or pelvic free fluid.   Electronically Signed   By: Lajean Manes M.D.   On: 10/22/2019 15:24    Assessment and Plan:  Pt stable  On my prior exam I did note that she had anterior vag wall prolapse to 0 with valsalva.  I told her I'd recommend seeing urogyn to see if the prolapse could potentially be causing the s/s first. I told her I want her to have her colonoscopy first and she has that scheduled for early feb so can schedule the urogyn consult sometime after that.       I discussed the assessment and treatment plan with the patient. The patient was provided an opportunity to ask questions and all were answered. The patient agreed with the plan and demonstrated an understanding of the instructions.   The patient was advised to call back or seek an in-person evaluation/go to the ED if the symptoms worsen or if the  condition fails to improve as anticipated.  I provided 20 minutes of non-face-to-face time during this encounter. The visit was done via a Telephone  visit.    [name], MD [facility]   Pain not qday and last was yesterday. Comes in morning and lasts all day. Feels crampy and stays down low.

## 2020-05-23 DIAGNOSIS — N8111 Cystocele, midline: Secondary | ICD-10-CM | POA: Insufficient documentation

## 2020-06-04 ENCOUNTER — Other Ambulatory Visit: Payer: Self-pay

## 2020-06-04 ENCOUNTER — Other Ambulatory Visit: Payer: Self-pay | Admitting: *Deleted

## 2020-06-04 MED ORDER — METHOTREXATE SODIUM CHEMO INJECTION 50 MG/2ML: INTRAMUSCULAR | 0 refills | Status: DC

## 2020-06-04 NOTE — Addendum Note (Signed)
Addended by: Shona Needles on: 06/04/2020 10:42 AM   Modules accepted: Orders

## 2020-06-04 NOTE — Telephone Encounter (Addendum)
Courtney from [facility] called stating they are patient's new mail order pharmacy.  They sent a refill request for Methotrexate which has been denied.  Please call back at [phone]  Reference [id number]

## 2020-06-04 NOTE — Telephone Encounter (Signed)
Last Visit: 04/05/2020 Next Visit: 07/02/2020 Labs: 05/01/2020, Platelet count is borderline elevated. Rest of CBC WNL. Potassium is elevated-5.6. rest of CMP WNL.  Current Dose per office note 04/05/2020, methotrexate 0.5 ml sq injections every 7 days DX:  Psoriatic arthritis   Last Fill:  03/08/2020  Okay to refill MTX?

## 2020-06-12 ENCOUNTER — Other Ambulatory Visit: Payer: Self-pay | Admitting: Rheumatology

## 2020-06-18 NOTE — Progress Notes (Signed)
Office Visit Note  Patient: Connie Hoffman             Date of Birth: Jul 05, 1960           MRN: [id number]             PCP: Jenel Lucks, PA-C Referring: Burna Cash* Visit Date: 07/02/2020 Occupation: @GUAROCC @  Subjective:  Medication management.   History of Present Illness: Connie Hoffman is a 60 y.o. female history of psoriatic arthritis, psoriasis and osteoarthritis.  She has been taking Cosentyx 150 mg injections every 2 weeks and methotrexate 0.5 mL subcu weekly along with folic acid.  She states she continues to have pain in her bilateral wrist joints and her knuckles.  She also has occasional discomfort in her right ankle.  None of the other joints are painful.  She is having a psoriasis flare underneath her breast and the groin region.  She had nerve conduction velocities done for numbness in her hands which are consistent with ulnar neuropathy.  She does not want to have surgery.  Activities of Daily Living:  Patient reports morning stiffness for 45 minutes.   Patient Reports nocturnal pain.  Difficulty dressing/grooming: Reports Difficulty climbing stairs: Reports Difficulty getting out of chair: Reports Difficulty using hands for taps, buttons, cutlery, and/or writing: Reports  Review of Systems  Constitutional: Positive for fatigue. Negative for night sweats, weight gain and weight loss.  HENT: Negative for mouth sores, trouble swallowing, trouble swallowing, mouth dryness and nose dryness.   Eyes: Positive for dryness. Negative for pain, redness and visual disturbance.  Respiratory: Negative for cough, shortness of breath and difficulty breathing.   Cardiovascular: Negative for chest pain, palpitations, hypertension, irregular heartbeat and swelling in legs/feet.  Gastrointestinal: Negative for blood in stool, constipation and diarrhea.  Endocrine: Negative for increased urination.  Genitourinary: Negative for vaginal dryness.   Musculoskeletal: Positive for arthralgias, joint pain, joint swelling and morning stiffness. Negative for myalgias, muscle weakness, muscle tenderness and myalgias.  Skin: Positive for rash. Negative for color change, hair loss, skin tightness, ulcers and sensitivity to sunlight.  Allergic/Immunologic: Negative for susceptible to infections.  Neurological: Negative for dizziness, memory loss, night sweats and weakness.  Hematological: Negative for swollen glands.  Psychiatric/Behavioral: Negative for depressed mood and sleep disturbance. The patient is not nervous/anxious.     PMFS History:  Patient Active Problem List   Diagnosis Date Noted  . Midline cystocele 05/23/2020  . Pelvic mass 12/04/2019  . DDD (degenerative disc disease), lumbar 01/21/2018  . Primary osteoarthritis of both hands 01/21/2018  . Primary osteoarthritis of both feet 01/21/2018  . Psoriasis 12/01/2017  . Psoriatic arthritis ([place]) 12/01/2017  . High risk medication use 12/01/2017  . 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 Thrombocytosis 12/01/2017  . 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 DDD (degenerative disc disease), lumbar   . Psoriatic arthritis ([place])   . Spinal stenosis     Family History  Problem Relation Age of Onset  . Diabetes Mother   . Hypertension Mother   . Heart Problems Mother   . Congestive Heart Failure Mother   . Diabetes Father   . Hypertension Father   . Stroke Sister   . Heart Problems Sister   . Prostate cancer Brother   . Diabetes Brother   . Asthma Daughter   . Healthy Daughter   . Healthy Son   . Healthy Son  Past Surgical History:  Procedure Laterality Date  . BACK SURGERY  05/18/2019   L4 and L5  . CHOLECYSTECTOMY    . CYSTECTOMY  07/2017  . JOINT REPLACEMENT Left    left knee  . LAPAROSCOPIC ENDOMETRIOSIS FULGURATION  2011  . [place] RESECTION  2018  . LAPAROSCOPIC HYSTERECTOMY  2014  .  LAPAROSCOPIC OOPHERECTOMY Right   . LUMBAR EPIDURAL INJECTION  04/13/2018  . REVISION TOTAL KNEE ARTHROPLASTY Left 12/2017  . TOTAL SHOULDER ARTHROPLASTY Bilateral   . TUBAL LIGATION     postpartum   Social History   Social History Narrative   Right handed    Lives with husband and 2 daughters    Immunization History  Administered Date(s) Administered  . Influenza,inj,Quad PF,6+ Mos 04/15/2018  . PFIZER(Purple Top)SARS-COV-2 Vaccination 07/15/2019, 08/05/2019, 03/13/2020  . Pneumococcal Polysaccharide-23 04/15/2018  . Zoster Recombinat (Shingrix) 05/13/2018, 10/25/2019     Objective: Vital Signs: Ht 5\' 5"  (1.651 m)   Wt 282 lb (127.9 kg)   BMI 46.93 kg/m    Physical Exam Vitals and nursing note reviewed.  Constitutional:      Appearance: She is well-developed and well-nourished.  HENT:     Head: Normocephalic and atraumatic.  Eyes:     Extraocular Movements: EOM normal.     Conjunctiva/sclera: Conjunctivae normal.  Cardiovascular:     Rate and Rhythm: Normal rate and regular rhythm.     Pulses: Intact distal pulses.     Heart sounds: Normal heart sounds.  Pulmonary:     Effort: Pulmonary effort is normal.     Breath sounds: Normal breath sounds.  Abdominal:     General: Bowel sounds are normal.     Palpations: Abdomen is soft.  Musculoskeletal:     Cervical back: Normal range of motion.  Lymphadenopathy:     Cervical: No cervical adenopathy.  Skin:    General: Skin is warm and dry.     Capillary Refill: Capillary refill takes less than 2 seconds.  Neurological:     Mental Status: She is alert and oriented to person, place, and time.  Psychiatric:        Mood and Affect: Mood and affect normal.        Behavior: Behavior normal.      Musculoskeletal Exam: C-spine was in good range of motion.  Shoulder joints, elbow joints, wrist joints, MCPs PIPs and DIPs with good range of motion with no synovitis.  Hip joints, knee joints, ankles, MTPs and PIPs with good  range of motion with no synovitis.  CDAI Exam: CDAI Score: 0.8  Patient Global: 5 mm; Provider Global: 3 mm Swollen: 0 ; Tender: 0  Joint Exam 07/02/2020   No joint exam has been documented for this visit   There is currently no information documented on the homunculus. Go to the Rheumatology activity and complete the homunculus joint exam.  Investigation: No additional findings.  Imaging: No results found.  Recent Labs: Lab Results  Component Value Date   WBC 5.4 05/01/2020   HGB 12.4 05/01/2020   PLT 407 (H) 05/01/2020   NA 144 05/01/2020   K 5.6 (H) 05/01/2020   CL 105 05/01/2020   CO2 29 05/01/2020   GLUCOSE 108 (H) 05/01/2020   BUN 11 05/01/2020   CREATININE 0.66 05/01/2020   BILITOT 0.3 05/01/2020   ALKPHOS 100 10/22/2019   AST 20 05/01/2020   ALT 19 05/01/2020   PROT 6.7 05/01/2020   ALBUMIN 3.4 (L) 10/22/2019  CALCIUM 9.2 05/01/2020   GFRAA 114 05/01/2020   QFTBGOLDPLUS NEGATIVE 05/01/2020    Speciality Comments: Cosentyx start date:04/2018  Procedures:  No procedures performed Allergies: Aspirin   Assessment / Plan:     Visit Diagnoses: Psoriatic arthritis (HCC)-patient complains of frequent flares of pain and discomfort in her bilateral wrist joints her right thumb over right second and third MCP joints and her right ankle.  I do not see any synovitis on my examination today.  She has been injecting Cosentyx 150 mg subcu every other week.  The methotrexate dose in the past was decreased due to elevation in her LFTs.  We discussed increasing methotrexate to 0.7 mL subcu weekly.  We will check labs in 3 weeks and then every 3 months.  At this point I do not see the need to change the biologic agent.  Psoriasis-I saw hyperpigmentation under her breast but no active psoriasis lesions were noted.  High risk medication use - Cosentyx 150 mg sq injections once every 14 days, methotrexate 0.5 ml sq injections every 7 days, and folic acid 1 mg daily.  Labs in  January 2022 showed mild elevation of platelets otherwise were unremarkable.  TB Gold on May 01, 2020 was negative.  Thrombocytosis-stable.  Primary osteoarthritis of both hands-she had mild osteoarthritic changes in her hands.  Ulnar neuropathy of both upper extremities - She was evaluated by Dr. Posey Pronto on 03/28/2020.  Findings were consistent with bilateral ulnar neuropathy.  She does not want to have surgery.  Have advised her to not to lean on her elbows.  She is also going to physical therapy for evaluation of frequent falls.  I advised her to get evaluation for ulnar neuropathy.  S/P total knee replacement, left-chronic pain  Primary osteoarthritis of right knee-she is planning to get her right total knee replacement in the future.  Primary osteoarthritis of both feet-patient has off-and-on discomfort but no synovitis was noted.  DDD (degenerative disc disease), lumbar-she complains of chronic discomfort.  History of gastric bypass  Frequent falls - According to the patient she is undergoing a thorough work-up by Dr. Posey Pronto.   Educated about COVID-19 virus infection-she is fully vaccinated against COVID-19 and also received a third dose.  I advised her to get a fourth dose (booster) 6 months after the third dose which is recommended for patients on immunosuppressive agents.  BMI 46.93-patient had gastric bypass surgery in the past.  Increased association of heart disease in patients with psoriatic arthritis was discussed.  Weight loss diet and exercise was emphasized.  Patient states she will be going for weight loss through her PCP.  Handout was placed in her AVS.  Orders: No orders of the defined types were placed in this encounter.  No orders of the defined types were placed in this encounter.   Follow-Up Instructions: Return in about 3 months (around 10/02/2020) for Psoriatic arthritis.   Bo Merino, MD  Note - This record has been created using Editor, commissioning.   Chart creation errors have been sought, but may not always  have been located. Such creation errors do not reflect on  the standard of medical care.

## 2020-06-26 ENCOUNTER — Other Ambulatory Visit: Payer: Self-pay | Admitting: Rheumatology

## 2020-06-26 NOTE — Progress Notes (Signed)
Follow-up Visit   Date: 06/27/20   Connie Hoffman MRN: [id number] DOB: 1960/08/30   Interim History: Connie Hoffman is a 60 y.o. right-handed female with psoriatic arthritis, GERD, and anxiety returning to the clinic for follow-up of bilateral ulnar neuropathy at the elbow.  The patient was accompanied to the clinic by self.  History of present illness: Starting early 2021, she began noticing numbness in both hands, with occasional tingling. Symptoms are noticeable when she wakes up or when she is resting.  She endorses weakness and difficulty opening jars/bottle.    She has tingling and cramping of the legs for the past 1-2 years and takes gabapentin 100mg  twice daily. She had tingling up to her knees, which is present at nighttime.  She was diagnosed with neuropathy in 2018 by [facility] while living in [place].  This was ordered by her pain management provider, who she was seeing for lumbar canal stenosis. She underwent lumbar surgery which has significantly helped her low back pain and tingling in her legs.  She continues to get muscle cramps at night time. She takes methocarbamol and tramadol for pain.    She takes methotrexate and Cosentyx for psoriatic arthritis.   UPDATE 06/26/2020:  She is here for follow-up and reports worsening numbness/tingling of the hands, despite using soft elbow pad and being careful about how she positions the arms.  She wakes up most morning with her hands falling asleep and finds herself often shaking them throughout the day to get relief.  Symptoms are not constant, but recur.  She also some weakness in the hands, especially with opening jars/bottles.    Medications:  Current Outpatient Medications on File Prior to Visit  Medication Sig Dispense Refill  . COSENTYX SENSOREADY, 300 MG, 150 MG/ML SOAJ INJECT 300MG  SUBCUTANEOUSLY EVERY 4 WEEKS 6 mL 0  . diphenhydramine-acetaminophen (TYLENOL PM) 25-500 MG TABS tablet Take 1 tablet by mouth at bedtime  as needed.    . DULoxetine (CYMBALTA) 30 MG capsule Take 30 mg by mouth daily.    . folic acid (FOLVITE) 1 MG tablet Take 1 mg by mouth daily.    Marland Kitchen gabapentin (NEURONTIN) 100 MG capsule Take 100 mg by mouth 3 (three) times daily.    Marland Kitchen ketoconazole (NIZORAL) 2 % cream Apply 1 application topically daily. 15 g 0  . Menthol, Topical Analgesic, (BIOFREEZE) 4 % GEL Apply 1 application topically See admin instructions. Apply to painful sites as directed    . methotrexate 50 MG/2ML injection Inject 0.54mL into the skin once weekly. 6 mL 0  . Multiple Vitamin (MULTIVITAMIN WITH MINERALS) TABS tablet Take 1 tablet by mouth daily.    Marland Kitchen triamcinolone ointment (KENALOG) 0.1 % Apply 1 application topically 2 (two) times daily as needed (for itching). Prn   0  . TUBERCULIN SYR 1CC/27GX1/2" 27G X 1/2" 1 ML MISC Use 1 syringe once weekly to inject methotrexate. 12 each 2   No current facility-administered medications on file prior to visit.    Allergies:  Allergies  Allergen Reactions  . Aspirin Other (See Comments)    GI bleeding    Vital Signs:  BP 104/68   Pulse 80   Ht 5\' 5"  (1.651 m)   Wt 280 lb (127 kg)   SpO2 100%   BMI 46.59 kg/m    Neurological Exam: MENTAL STATUS including orientation to time, place, person, recent and remote memory, attention span and concentration, language, and fund of knowledge is normal.  Speech is not  dysarthric.  CRANIAL NERVES:  Normal conjugate, extra-ocular eye movements in all directions of gaze.  No ptosis.   MOTOR:  Motor strength is 5/5 in all extremities, except bilateral finger abductors are 4/5.  No atrophy, fasciculations or abnormal movements.  No pronator drift.  Tone is normal.    MSRs:  Reflexes are 2+/4 throughout.  SENSORY:  Intact to vibration and temperature throughout.  COORDINATION/GAIT:  Gait is antalgic (bilateral knee pain), stable unassisted   Data: [facility]/EMG of the arms 05/08/2020: 1. Bilateral ulnar neuropathy with slowing across  the elbow, predominantly demyelinating, mild-to-moderate and worse on the right. 2. There is no evidence of a cervical radiculopathy or carpal tunnel syndrome affecting either upper extremity  IMPRESSION/PLAN: Bilateral cubital tunnel syndrome, worse on the right.    - No improvement with conservative therapies  - I discussed management strategies including occupational therapy vs surgical decompression and it was mutually decided to start occupational therapy  - Continue conservative strategies of minimizing elbow flexion and using soft elbow pad  Return to clinic in 3 months  Thank you for allowing me to participate in patient's care.  If I can answer any additional questions, I would be pleased to do so.    Sincerely,    Avaneesh Pepitone K. Allena Katz, DO

## 2020-06-27 ENCOUNTER — Encounter: Payer: Self-pay | Admitting: Neurology

## 2020-06-27 ENCOUNTER — Ambulatory Visit (INDEPENDENT_AMBULATORY_CARE_PROVIDER_SITE_OTHER): Payer: Medicare Other | Admitting: Neurology

## 2020-06-27 ENCOUNTER — Other Ambulatory Visit: Payer: Self-pay

## 2020-06-27 VITALS — BP 104/68 | HR 80 | Ht 65.0 in | Wt 280.0 lb

## 2020-06-27 DIAGNOSIS — G5623 Lesion of ulnar nerve, bilateral upper limbs: Secondary | ICD-10-CM | POA: Diagnosis not present

## 2020-06-27 NOTE — Patient Instructions (Addendum)
We will refer you to occupational therapy for ulnar neuropathy.  Return to clinic in 3 months

## 2020-06-28 NOTE — Progress Notes (Deleted)
[facility] New Patient Evaluation and Consultation  Referring Provider: [name], MD PCP: Marinda Elk Date of Service: 07/03/2020  SUBJECTIVE Chief Complaint: No chief complaint on file.  History of Present Illness: Connie Hoffman is a 60 y.o. Black or African-American female seen in consultation at the request of Dr. Ilda Basset for evaluation of prolapse.    Review of records from Dr Ilda Basset significant for: Noted cystocele to introitus on exam. Patient had previously reported that she had a hysterectomy but recent pelvic US shows presence of uterus and cervix.   Urinary Symptoms: {urine leakage?:24754} Leaks *** time(s) per {days/wks/mos/yrs:310907}.  Pad use: {NUMBERS 1-10:18281} {pad option:24752} per day.   She {ACTION; IS/IS ENI:77824235} bothered by her UI symptoms.  Day time voids ***.  Nocturia: *** times per night to void. Voiding dysfunction: she {empties:24755} her bladder well.  {DOES NOT does:27190::"does not"} use a catheter to empty bladder.  When urinating, she feels {urine symptoms:24756} Drinks: *** per day  UTIs: {NUMBERS 1-10:18281} UTI's in the last year.   {ACTIONS;DENIES/REPORTS:21021675::"Denies"} history of {urologic concerns:24757}  Pelvic Organ Prolapse Symptoms:                  She {denies/ admits to:24761} a feeling of a bulge the vaginal area. It has been present for {NUMBER 1-10:22536} {days/wks/mos/yrs:310907}.  She {denies/ admits to:24761} seeing a bulge.  This bulge {ACTION; IS/IS TIR:44315400} bothersome.  Bowel Symptom: Bowel movements: *** time(s) per {Time; day/week/month:13537} Stool consistency: {stool consistency:24758} Straining: {yes/no:19897}.  Splinting: {yes/no:19897}.  Incomplete evacuation: {yes/no:19897}.  She {denies/ admits to:24761} accidental bowel leakage / fecal incontinence  Occurs: *** time(s) per {Time; day/week/month:13537}  Consistency with leakage: {stool  consistency:24758} Bowel regimen: {bowel regimen:24759} Last colonoscopy: Date ***, Results ***  Sexual Function Sexually active: {yes/no:19897}.  Sexual orientation: {Sexual Orientation:[phone]} Pain with sex: {pain with sex:24762}  Pelvic Pain {denies/ admits to:24761} pelvic pain Location: *** Pain occurs: *** Prior pain treatment: *** Improved by: *** Worsened by: ***   Past Medical History:  Past Medical History:  Diagnosis Date  . DDD (degenerative disc disease), lumbar   . Psoriatic arthritis ([place])   . Spinal stenosis      Past Surgical History:   Past Surgical History:  Procedure Laterality Date  . BACK SURGERY  05/18/2019   L4 and L5  . CHOLECYSTECTOMY    . CYSTECTOMY  07/2017  . JOINT REPLACEMENT Left    left knee  . LAPAROSCOPIC ENDOMETRIOSIS FULGURATION  2011  . [place] RESECTION  2018  . LAPAROSCOPIC HYSTERECTOMY  2014  . LAPAROSCOPIC OOPHERECTOMY Right   . LUMBAR EPIDURAL INJECTION  04/13/2018  . REVISION TOTAL KNEE ARTHROPLASTY Left 12/2017  . TOTAL SHOULDER ARTHROPLASTY Bilateral   . TUBAL LIGATION     postpartum     Past OB/GYN History: G{NUMBERS 1-10:18281} P{NUMBERS 1-10:18281} Vaginal deliveries: ***,  Forceps/ Vacuum deliveries: ***, Cesarean section: *** Menopausal: {menopausal:24763} Contraception: ***. Last pap smear was ***.  Any history of abnormal pap smears: {yes/no:19897}.   Medications: She has a current medication list which includes the following prescription(s): cosentyx sensoready (300 mg), diphenhydramine-acetaminophen, duloxetine, folic acid, gabapentin, ketoconazole, biofreeze, methotrexate, multivitamin with minerals, triamcinolone ointment, and tuberculin syr 1cc/27gx1/2".   Allergies: Patient is allergic to aspirin.   Social History:  Social History   Tobacco Use  . Smoking status: Never Smoker  . Smokeless tobacco: Never Used  Vaping Use  . Vaping Use: Never used  Substance Use Topics   . Alcohol use: Never  .  Drug use: Never    Relationship status: {relationship status:24764} She lives with ***.   She {ACTION; IS/IS ZYS:06301601} employed ***. Regular exercise: {Yes/No:304960894} History of abuse: {Yes/No:304960894}  Family History:   Family History  Problem Relation Age of Onset  . Diabetes Mother   . Hypertension Mother   . Heart Problems Mother   . Congestive Heart Failure Mother   . Diabetes Father   . Hypertension Father   . Stroke Sister   . Heart Problems Sister   . Prostate cancer Brother   . Diabetes Brother   . Asthma Daughter   . Healthy Daughter   . Healthy Son   . Healthy Son      Review of Systems: ROS   OBJECTIVE Physical Exam: There were no vitals filed for this visit.  Physical Exam   GU / Detailed Urogynecologic Evaluation:  Pelvic Exam: Normal external female genitalia; Bartholin's and Skene's glands normal in appearance; urethral meatus normal in appearance, no urethral masses or discharge.   CST: {gen negative/positive:315881}  Reflexes: bulbocavernosis {DESC; PRESENT/NOT PRESENT:21021351}, anocutaneous {DESC; PRESENT/NOT PRESENT:21021351} ***bilaterally.  Speculum exam reveals normal vaginal mucosa {With/Without:20273} atrophy. Cervix {exam; gyn cervix:30847}. Uterus {exam; pelvic uterus:30849}. Adnexa {exam; adnexa:12223}.    s/p hysterectomy: Speculum exam reveals normal vaginal mucosa {With/Without:20273}  atrophy and normal vaginal cuff.  Adnexa {exam; adnexa:12223}.    With apex supported, anterior compartment defect was {reduced:24765}  Pelvic floor strength {Roman # I-V:19040}/V, puborectalis {Roman # I-V:19040}/V external anal sphincter {Roman # I-V:19040}/V  Pelvic floor musculature: Right levator {Tender/Non-tender:20250}, Right obturator {Tender/Non-tender:20250}, Left levator {Tender/Non-tender:20250}, Left obturator {Tender/Non-tender:20250}  POP-Q:   POP-Q                                                Aa                                               Ba                                                 C                                                Gh                                               Pb                                               tvl  Ap                                               Bp                                                 D     Rectal Exam:  Normal sphincter tone, {rectocele:24766} distal rectocele, enterocoele {DESC; PRESENT/NOT PRESENT:21021351}, no rectal masses, {sign of:24767} dyssynergia when asking the patient to bear down.  Post-Void Residual (PVR) by Bladder Scan: In order to evaluate bladder emptying, we discussed obtaining a postvoid residual and she agreed to this procedure.  Procedure: The ultrasound unit was placed on the patient's abdomen in the suprapubic region after the patient had voided. A PVR of *** ml was obtained by bladder scan.  Laboratory Results: @ENCLABS @   ***I visualized the urine specimen, noting the specimen to be {urine color:24768}  ASSESSMENT AND PLAN Connie Hoffman is a 60 y.o. with: No diagnosis found.    Jaquita Folds, MD   Medical Decision Making:  - Reviewed/ ordered a clinical laboratory test - Reviewed/ ordered a radiologic study - Reviewed/ ordered medicine test - Decision to obtain old records - Discussion of management of or test interpretation with an external physician / other healthcare professional  - Assessment requiring independent historian - Review and summation of prior records - Independent review of image, tracing or specimen

## 2020-07-02 ENCOUNTER — Other Ambulatory Visit: Payer: Self-pay

## 2020-07-02 ENCOUNTER — Ambulatory Visit (INDEPENDENT_AMBULATORY_CARE_PROVIDER_SITE_OTHER): Payer: Medicare Other | Admitting: Rheumatology

## 2020-07-02 ENCOUNTER — Encounter: Payer: Self-pay | Admitting: Rheumatology

## 2020-07-02 VITALS — BP 133/78 | HR 72 | Resp 17 | Ht 65.0 in | Wt 282.0 lb

## 2020-07-02 DIAGNOSIS — Z7189 Other specified counseling: Secondary | ICD-10-CM

## 2020-07-02 DIAGNOSIS — M5136 Other intervertebral disc degeneration, lumbar region: Secondary | ICD-10-CM

## 2020-07-02 DIAGNOSIS — Z6841 Body Mass Index (BMI) 40.0 and over, adult: Secondary | ICD-10-CM

## 2020-07-02 DIAGNOSIS — M19042 Primary osteoarthritis, left hand: Secondary | ICD-10-CM

## 2020-07-02 DIAGNOSIS — Z79899 Other long term (current) drug therapy: Secondary | ICD-10-CM | POA: Diagnosis not present

## 2020-07-02 DIAGNOSIS — L409 Psoriasis, unspecified: Secondary | ICD-10-CM

## 2020-07-02 DIAGNOSIS — Z9884 Bariatric surgery status: Secondary | ICD-10-CM

## 2020-07-02 DIAGNOSIS — G5623 Lesion of ulnar nerve, bilateral upper limbs: Secondary | ICD-10-CM

## 2020-07-02 DIAGNOSIS — L4050 Arthropathic psoriasis, unspecified: Secondary | ICD-10-CM | POA: Diagnosis not present

## 2020-07-02 DIAGNOSIS — M19071 Primary osteoarthritis, right ankle and foot: Secondary | ICD-10-CM

## 2020-07-02 DIAGNOSIS — M19041 Primary osteoarthritis, right hand: Secondary | ICD-10-CM

## 2020-07-02 DIAGNOSIS — R296 Repeated falls: Secondary | ICD-10-CM

## 2020-07-02 DIAGNOSIS — M19072 Primary osteoarthritis, left ankle and foot: Secondary | ICD-10-CM

## 2020-07-02 DIAGNOSIS — Z96652 Presence of left artificial knee joint: Secondary | ICD-10-CM

## 2020-07-02 DIAGNOSIS — D75839 Thrombocytosis, unspecified: Secondary | ICD-10-CM

## 2020-07-02 DIAGNOSIS — M1711 Unilateral primary osteoarthritis, right knee: Secondary | ICD-10-CM

## 2020-07-02 DIAGNOSIS — R202 Paresthesia of skin: Secondary | ICD-10-CM

## 2020-07-02 MED ORDER — METHOTREXATE SODIUM CHEMO INJECTION 50 MG/2ML: INTRAMUSCULAR | 0 refills | Status: DC

## 2020-07-02 NOTE — Patient Instructions (Signed)
Standing Labs We placed an order today for your standing lab work.   Please have your standing labs drawn in 3 weeks and then every 3 months If possible, please have your labs drawn 2 weeks prior to your appointment so that the provider can discuss your results at your appointment.  We have open lab daily Monday through Thursday from 1:30-4:30 PM and Friday from 1:30-4:00 PM at the office of Dr. Bo Merino, [facility].   Please be advised, all patients with office appointments requiring lab work will take precedents over walk-in lab work.  If possible, please come for your lab work on Monday and Friday afternoons, as you may experience shorter wait times. The office is located at [address], [place], [place], [place] [address] No appointment is necessary.   Labs are drawn by [facility]. Please bring your co-pay at the time of your lab draw.  You may receive a bill from [facility] for your lab work.  If you wish to have your labs drawn at another location, please call the office 24 hours in advance to send orders.  If you have any questions regarding directions or hours of operation,  please call [phone].   As a reminder, please drink plenty of water prior to coming for your lab work. Thanks!  COVID-19 vaccine recommendations:   COVID-19 vaccine is recommended for everyone (unless you are allergic to a vaccine component), even if you are on a medication that suppresses your immune system.   If you are on Methotrexate, Cellcept (mycophenolate), Rinvoq, Morrie Sheldon, and Olumiant- hold the medication for 1 week after each vaccine. Hold Methotrexate for 2 weeks after the single dose COVID-19 vaccine.   It is recommended that patients on immunosuppressive therapy should receive first 3 COVID-19 vaccine doses three 1 month apart and the fourth dose (booster) 6 months after the third dose.  Do not take Tylenol or any anti-inflammatory medications (NSAIDs) 24 hours prior to the  COVID-19 vaccination.   There is no direct evidence about the efficacy of the COVID-19 vaccine in individuals who are on medications that suppress the immune system.   Even if you are fully vaccinated, and you are on any medications that suppress your immune system, please continue to wear a mask, maintain at least six feet social distance and practice hand hygiene.   If you develop a COVID-19 infection, please contact your PCP or our office to determine if you need monoclonal antibody infusion.  The booster vaccine is now available for immunocompromised patients.   Please see the following web sites for updated information.   [email or web address]  Heart Disease Prevention   Your inflammatory disease increases your risk of heart disease which includes heart attack, stroke, atrial fibrillation (irregular heartbeats), high blood pressure, heart failure and atherosclerosis (plaque in the arteries).  It is important to reduce your risk by:   . Keep blood pressure, cholesterol, and blood sugar at healthy levels   . Smoking Cessation   . Maintain a healthy weight  o BMI 20-25   . Eat a healthy diet  o Plenty of fresh fruit, vegetables, and whole grains  o Limit saturated fats, foods high in sodium, and added sugars  o DASH and Mediterranean diet   . Increase physical activity  o Recommend moderate physically activity for 150 minutes per week/ 30 minutes a day for five days a week These can be broken up into three separate ten-minute sessions during the day.   . Reduce Stress  .  Meditation, slow breathing exercises, yoga, coloring books  . Dental visits twice a year

## 2020-07-03 ENCOUNTER — Ambulatory Visit: Payer: Medicare Other | Admitting: Obstetrics and Gynecology

## 2020-07-18 ENCOUNTER — Other Ambulatory Visit: Payer: Self-pay | Admitting: Physician Assistant

## 2020-07-18 NOTE — Telephone Encounter (Signed)
Next Visit: 10/01/2020  Last Visit:  07/02/2020  Last Fill: 05/16/2020  KV:[id number] arthritis  Current Dose per office note on 07/02/2020: Cosentyx 150 mg sq injections once every 14 days (rx may reflect differently due to insurance reasons?)   Labs: 05/01/2020 Platelet count is borderline elevated. Rest of CBC WNL. Potassium is elevated-5.6. rest of CMP WNL  TB Gold: 05/01/2020 negative   Okay to refill cosentyx?

## 2020-07-21 ENCOUNTER — Encounter (HOSPITAL_COMMUNITY): Payer: Self-pay | Admitting: Emergency Medicine

## 2020-07-21 ENCOUNTER — Emergency Department (HOSPITAL_COMMUNITY)
Admission: EM | Admit: 2020-07-21 | Discharge: 2020-07-21 | Disposition: A | Discharge status: Home / Self Care | Payer: Medicare Other | Attending: Emergency Medicine | Admitting: Emergency Medicine

## 2020-07-21 DIAGNOSIS — Z96611 Presence of right artificial shoulder joint: Secondary | ICD-10-CM | POA: Diagnosis not present

## 2020-07-21 DIAGNOSIS — R21 Rash and other nonspecific skin eruption: Secondary | ICD-10-CM | POA: Diagnosis present

## 2020-07-21 DIAGNOSIS — Z96652 Presence of left artificial knee joint: Secondary | ICD-10-CM | POA: Diagnosis not present

## 2020-07-21 DIAGNOSIS — Z96612 Presence of left artificial shoulder joint: Secondary | ICD-10-CM | POA: Diagnosis not present

## 2020-07-21 MED ORDER — FLUCONAZOLE 200 MG PO TABS: 200.0000 mg | ORAL_TABLET | Freq: Every day | ORAL | 0 refills | Status: DC

## 2020-07-21 MED ORDER — AMOXICILLIN-POT CLAVULANATE 875-125 MG PO TABS: 1.0000 | ORAL_TABLET | Freq: Two times a day (BID) | ORAL | 0 refills | Status: DC

## 2020-07-21 NOTE — Discharge Instructions (Addendum)
Wash the areas of rash, daily and clean with soap and water.  After washing and rinsing, dry the tissue very well with a hair dryer.  After that apply the steroid cream.  Call your dermatologist in the morning for follow-up appointment to be seen in a day or 2.  Use Tylenol for pain.

## 2020-07-21 NOTE — ED Triage Notes (Signed)
C/o rash under bilateral breast, groin, and buttocks x 1 month that has progressed and now raw areas.

## 2020-07-21 NOTE — ED Provider Notes (Signed)
[facility] EMERGENCY DEPARTMENT Provider Note   CSN: [id number] Arrival date & time: 07/21/20  1145     History Chief Complaint  Patient presents with  . Rash    Connie Hoffman is a 60 y.o. female.  HPI She presents for worsening rash for about a month.  Rash is in multiple areas including the breast, around the neck, under her panniculus, gluteal crease.  The area underneath the rash has become increasingly tender over the last several days.  She is using both Kenalog and triamcinolone cream on the rash and applying cornstarch to help dry out the areas.  She is currently washing twice a day.  She denies fever, chills, weakness or dizziness.  She is being treated empirically by her dermatologist who has not seen them recently.  She has a history of similar rash when she had trouble with her psoriasis.  There are no other known modifying factors.    Past Medical History:  Diagnosis Date  . DDD (degenerative disc disease), lumbar   . Psoriatic arthritis ([place])   . Spinal stenosis     Patient Active Problem List   Diagnosis Date Noted  . Midline cystocele 05/23/2020  . Pelvic mass 12/04/2019  . DDD (degenerative disc disease), lumbar 01/21/2018  . Primary osteoarthritis of both hands 01/21/2018  . Primary osteoarthritis of both feet 01/21/2018  . Psoriasis 12/01/2017  . Psoriatic arthritis ([place]) 12/01/2017  . High risk medication use 12/01/2017  . 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 Thrombocytosis 12/01/2017  . 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    Past Surgical History:  Procedure Laterality Date  . BACK SURGERY  05/18/2019   L4 and L5  . CHOLECYSTECTOMY    . CYSTECTOMY  07/2017  . JOINT REPLACEMENT Left    left knee  . LAPAROSCOPIC ENDOMETRIOSIS FULGURATION  2011  . [place] RESECTION  2018  . LAPAROSCOPIC HYSTERECTOMY  2014  . LAPAROSCOPIC OOPHERECTOMY Right   . LUMBAR EPIDURAL INJECTION  04/13/2018  .  REVISION TOTAL KNEE ARTHROPLASTY Left 12/2017  . TOTAL SHOULDER ARTHROPLASTY Bilateral   . TUBAL LIGATION     postpartum     OB History    Gravida  3   Para      Term      Preterm      AB      Living  3     SAB      IAB      Ectopic      Multiple      Live Births  3        Obstetric Comments  Vaginal deliveries x 3.         Family History  Problem Relation Age of Onset  . Diabetes Mother   . Hypertension Mother   . Heart Problems Mother   . Congestive Heart Failure Mother   . Diabetes Father   . Hypertension Father   . Stroke Sister   . Heart Problems Sister   . Prostate cancer Brother   . Diabetes Brother   . Asthma Daughter   . Healthy Daughter   . Healthy Son   . Healthy Son     Social History   Tobacco Use  . Smoking status: Never Smoker  . Smokeless tobacco: Never Used  Vaping Use  . Vaping Use: Never used  Substance Use Topics  . Alcohol use: Never  . Drug use: Never    Home Medications Prior to  Admission medications   Medication Sig Start Date End Date Taking? Authorizing Provider  amoxicillin-clavulanate (AUGMENTIN) 875-125 MG tablet Take 1 tablet by mouth 2 (two) times daily. One po bid x 7 days 07/21/20  Yes Daleen Bo, MD  COSENTYX SENSOREADY, 300 MG, 150 MG/ML SOAJ INJECT 300MG  SUBCUTANEOUSLY EVERY 4 WEEKS 07/18/20   Ofilia Neas, PA-C  fluconazole (DIFLUCAN) 200 MG tablet Take 1 tablet (200 mg total) by mouth daily. 07/21/20  Yes Daleen Bo, MD  diphenhydramine-acetaminophen (TYLENOL PM) 25-500 MG TABS tablet Take 1 tablet by mouth at bedtime as needed.    [provider]  DULoxetine (CYMBALTA) 30 MG capsule Take 30 mg by mouth daily.    [provider]  folic acid (FOLVITE) 1 MG tablet Take 1 mg by mouth daily.    [provider]  gabapentin (NEURONTIN) 100 MG capsule Take 100 mg by mouth 3 (three) times daily.    [provider]  ketoconazole (NIZORAL) 2 % cream Apply 1 application  topically daily. 07/27/19   Ofilia Neas, PA-C  Menthol, Topical Analgesic, (BIOFREEZE) 4 % GEL Apply 1 application topically See admin instructions. Apply to painful sites as directed    [provider]  methotrexate 50 MG/2ML injection Inject 0.109mL into the skin once weekly. 07/02/20   Bo Merino, MD  Multiple Vitamin (MULTIVITAMIN WITH MINERALS) TABS tablet Take 1 tablet by mouth daily.    [provider]  triamcinolone ointment (KENALOG) 0.1 % Apply 1 application topically 2 (two) times daily as needed (for itching). Prn  10/05/17   [provider]  TUBERCULIN SYR 1CC/27GX1/2" 27G X 1/2" 1 ML MISC Use 1 syringe once weekly to inject methotrexate. 01/05/20   Ofilia Neas, PA-C    Allergies    Aspirin  Review of Systems   Review of Systems  All other systems reviewed and are negative.   Physical Exam Updated Vital Signs BP (!) 143/82 (BP Location: Right Arm)   Pulse 81   Temp 97.9 F (36.6 C) (Oral)   Resp 17   SpO2 98%   Physical Exam Vitals and nursing note reviewed.  Constitutional:      General: She is not in acute distress.    Appearance: She is well-developed. She is obese. She is not ill-appearing, toxic-appearing or diaphoretic.  HENT:     Head: Normocephalic and atraumatic.     Right Ear: External ear normal.     Left Ear: External ear normal.  Eyes:     Conjunctiva/sclera: Conjunctivae normal.     Pupils: Pupils are equal, round, and reactive to light.  Neck:     Trachea: Phonation normal.  Cardiovascular:     Rate and Rhythm: Normal rate.  Pulmonary:     Effort: Pulmonary effort is normal.  Abdominal:     General: There is no distension.     Palpations: Abdomen is soft.     Tenderness: There is no abdominal tenderness.  Musculoskeletal:        General: Normal range of motion.     Cervical back: Normal range of motion and neck supple.  Skin:    General: Skin is warm and dry.     Comments: Consistent rash beneath breasts,  beneath panniculus, intergluteal crease: Characterized by redness, tenderness, and minimal clear discharge.  There is no associated fluctuance or purulent drainage.  She has other findings consistent with psoriasis, flat, discolored, slightly discolored, noninflammatory skin changes.  Neurological:     Mental Status: She  is alert and oriented to person, place, and time.     Cranial Nerves: No cranial nerve deficit.     Sensory: No sensory deficit.     Motor: No abnormal muscle tone.     Coordination: Coordination normal.  Psychiatric:        Behavior: Behavior normal.        Thought Content: Thought content normal.        Judgment: Judgment normal.     ED Results / Procedures / Treatments   Labs (all labs ordered are listed, but only abnormal results are displayed) Labs Reviewed - No data to display  EKG None  Radiology No results found.  Procedures Procedures   Medications Ordered in ED Medications - No data to display  ED Course  I have reviewed the triage vital signs and the nursing notes.  Pertinent labs & imaging results that were available during my care of the patient were reviewed by me and considered in my medical decision making (see chart for details).    MDM Rules/Calculators/A&P                           Patient Vitals for the past 24 hrs:  BP Temp Temp src Pulse Resp SpO2  07/21/20 1345 (!) 143/82 -- -- 81 17 98 %  07/21/20 1148 128/71 97.9 F (36.6 C) Oral 82 16 98 %    2:07 PM Reevaluation with update and discussion. After initial assessment and treatment, an updated evaluation reveals no change in clinical status, findings cussed with patient and husband, all questions answered. Daleen Bo   Medical Decision Making:  This patient is presenting for evaluation of rash, worsening over several weeks, which does require a range of treatment options, and is a complaint that involves a moderate risk of morbidity and mortality. The differential  diagnoses include allergic reaction, psoriasis, fungal infection. I decided to review old records, and in summary patient presenting with rash, that she attributes to psoriasis however it is not located typical areas for psoriasis.  I obtained additional historical information from husband at bedside.    Critical Interventions-clinical evaluation, discussion with patient and husband  After These Interventions, the Patient was reevaluated and was found stable for discharge.  Rash appears to be progressive, and likely fungal in origin.  She may have secondary bacterial infection.  Doubt sepsis.  No indication for further ED evaluation or hospitalization at this time.  Will cover with Augmentin and Diflucan, with instructions on care including daily cleansing, drying well, reapplication of steroid cream and starch if needed.  Directed to follow-up with dermatologist ASAP  CRITICAL CARE-no Performed by: Daleen Bo  Nursing Notes Reviewed/ Care Coordinated Applicable Imaging Reviewed Interpretation of Laboratory Data incorporated into ED treatment  The patient appears reasonably screened and/or stabilized for discharge and I doubt any other medical condition or other [facility] requiring further screening, evaluation, or treatment in the ED at this time prior to discharge.  Plan: Home Medications-continue usual; Home Treatments-rest, fluids; return here if the recommended treatment, does not improve the symptoms; Recommended follow up-dermatology for follow-up care ASAP     Final Clinical Impression(s) / ED Diagnoses Final diagnoses:  Rash    Rx / DC Orders ED Discharge Orders         Ordered    fluconazole (DIFLUCAN) 200 MG tablet  Daily        07/21/20 1404    amoxicillin-clavulanate (AUGMENTIN) 875-125 MG  tablet  2 times daily        07/21/20 1404           Daleen Bo, MD 07/21/20 1410

## 2020-07-23 ENCOUNTER — Other Ambulatory Visit: Payer: Self-pay

## 2020-07-23 DIAGNOSIS — Z79899 Other long term (current) drug therapy: Secondary | ICD-10-CM

## 2020-07-24 LAB — COMPLETE METABOLIC PANEL WITH GFR
AG Ratio: 1.2 (calc) (ref 1.0–2.5)
ALT: 43 U/L — ABNORMAL HIGH (ref 6–29)
AST: 28 U/L (ref 10–35)
Albumin: 3.6 g/dL (ref 3.6–5.1)
Alkaline phosphatase (APISO): 122 U/L (ref 37–153)
BUN: 11 mg/dL (ref 7–25)
CO2: 31 mmol/L (ref 20–32)
Calcium: 8.9 mg/dL (ref 8.6–10.4)
Chloride: 103 mmol/L (ref 98–110)
Creat: 0.76 mg/dL (ref 0.50–1.05)
GFR, Est African American: 101 mL/min/{1.73_m2} (ref >=60)
GFR, Est Non African American: 87 mL/min/{1.73_m2} (ref >=60)
Globulin: 2.9 g/dL (calc) (ref 1.9–3.7)
Glucose, Bld: 135 mg/dL — ABNORMAL HIGH (ref 65–99)
Potassium: 5.1 mmol/L (ref 3.5–5.3)
Sodium: 141 mmol/L (ref 135–146)
Total Bilirubin: 0.3 mg/dL (ref 0.2–1.2)
Total Protein: 6.5 g/dL (ref 6.1–8.1)

## 2020-07-24 LAB — CBC WITH DIFFERENTIAL/PLATELET
Absolute Monocytes: 803 cells/uL (ref 200–950)
Basophils Absolute: 41 cells/uL (ref 0–200)
Basophils Relative: 0.4 %
Eosinophils Absolute: 165 cells/uL (ref 15–500)
Eosinophils Relative: 1.6 %
HCT: 37.2 % (ref 35.0–45.0)
Hemoglobin: 12.1 g/dL (ref 11.7–15.5)
Lymphs Abs: 1710 cells/uL (ref 850–3900)
MCH: 29.8 pg (ref 27.0–33.0)
MCHC: 32.5 g/dL (ref 32.0–36.0)
MCV: 91.6 fL (ref 80.0–100.0)
MPV: 10.1 fL (ref 7.5–12.5)
Monocytes Relative: 7.8 %
Neutro Abs: 7581 cells/uL (ref 1500–7800)
Neutrophils Relative %: 73.6 %
Platelets: 396 10*3/uL (ref 140–400)
RBC: 4.06 10*6/uL (ref 3.80–5.10)
RDW: 13.7 % (ref 11.0–15.0)
Total Lymphocyte: 16.6 %
WBC: 10.3 10*3/uL (ref 3.8–10.8)

## 2020-07-24 NOTE — Progress Notes (Signed)
CBC WNL. Glucose is elevated-135. ALT is borderline elevated-43.  Please advise the patient to avoid tylenol, NSAIDs, and alcohol. Please clarify if she has been taking tylenol PM?

## 2020-08-20 ENCOUNTER — Other Ambulatory Visit: Payer: Self-pay

## 2020-08-20 ENCOUNTER — Encounter: Payer: Self-pay | Admitting: Obstetrics and Gynecology

## 2020-08-20 ENCOUNTER — Ambulatory Visit (INDEPENDENT_AMBULATORY_CARE_PROVIDER_SITE_OTHER): Payer: Medicare Other | Admitting: Obstetrics and Gynecology

## 2020-08-20 VITALS — BP 115/75 | HR 76 | Ht 65.0 in

## 2020-08-20 DIAGNOSIS — N8110 Cystocele, unspecified: Secondary | ICD-10-CM

## 2020-08-20 DIAGNOSIS — M62838 Other muscle spasm: Secondary | ICD-10-CM | POA: Diagnosis not present

## 2020-08-20 DIAGNOSIS — R350 Frequency of micturition: Secondary | ICD-10-CM | POA: Diagnosis not present

## 2020-08-20 DIAGNOSIS — N812 Incomplete uterovaginal prolapse: Secondary | ICD-10-CM | POA: Diagnosis not present

## 2020-08-20 LAB — POCT URINALYSIS DIPSTICK
Appearance: NORMAL
Bilirubin, UA: NEGATIVE
Blood, UA: NEGATIVE
Glucose, UA: NEGATIVE
Ketones, UA: NEGATIVE
Leukocytes, UA: NEGATIVE
Nitrite, UA: NEGATIVE
Protein, UA: NEGATIVE
Spec Grav, UA: >=1.03 — AB (ref 1.010–1.025)
Urobilinogen, UA: 0.2 E.U./dL
pH, UA: 5 (ref 5.0–8.0)

## 2020-08-20 NOTE — Progress Notes (Signed)
[facility] New Patient Evaluation and Consultation  Referring Provider: [name], MD PCP: Marinda Elk Date of Service: 08/20/2020  SUBJECTIVE Chief Complaint: New Patient (Initial Visit) - pelvic pain  History of Present Illness: Connie Hoffman is a 60 y.o. Black or African-American female seen in consultation at the request of Dr. Ilda Basset for evaluation of vaginal bulge and pelvic pain.   Review of records significant for: Previously noted anterior vaginal wall prolapse to introitus with valsalva. Patient had reported history of hysterectomy, but uterus/ cervix noted on pelvic imaging.   Urinary Symptoms: Does not leak urine.   Day time voids 4-6.  Nocturia: 3 times per night to void. Voiding dysfunction: she empties her bladder well.  does not use a catheter to empty bladder.  When urinating, she feels dribbling after finishing   UTIs: 0 UTI's in the last year.   Denies history of blood in urine and kidney or bladder stones  Pelvic Organ Prolapse Symptoms:                  She Denies a feeling of a bulge the vaginal area, but sometimes has a feeling of pressure like her uterus is going to fall out  Bowel Symptom: Bowel movements: 2 time(s) per day Stool consistency: soft  Straining: no.  Splinting: no.  Incomplete evacuation: no.  She Denies accidental bowel leakage / fecal incontinence Bowel regimen: none   Sexual Function Sexually active: yes.  Pain with sex: No  Pelvic Pain Admits to pelvic pain Has had it for several years. Had an oophorectomy and another surgery for endometriosis due to the pain in [place], [place] [facility]). Thought that she had hysterectomy but was noted to still have uterus/ cervix on imaging.  Pain/ pressure in the vaginal area that goes to her back.    Past Medical History:  Past Medical History:  Diagnosis Date  . DDD (degenerative disc disease), lumbar   . Psoriatic arthritis  ([place])   . Spinal stenosis      Past Surgical History:   Past Surgical History:  Procedure Laterality Date  . BACK SURGERY  05/18/2019   L4 and L5  . CHOLECYSTECTOMY    . CYSTECTOMY  07/2017  . JOINT REPLACEMENT Left    left knee  . LAPAROSCOPIC ENDOMETRIOSIS FULGURATION  2011  . [place] RESECTION  2018  . LAPAROSCOPIC OOPHERECTOMY Right   . LUMBAR EPIDURAL INJECTION  04/13/2018  . REVISION TOTAL KNEE ARTHROPLASTY Left 12/2017  . TOTAL SHOULDER ARTHROPLASTY Bilateral   . TUBAL LIGATION     postpartum     Past OB/GYN History: G3 P3 Vaginal deliveries: 3,  Forceps/ Vacuum deliveries: 0, Cesarean section: 0 Menopausal: Yes, Denies vaginal bleeding since menopause Last pap smear was 11/2019, negative.  Any history of abnormal pap smears: no.   Medications: She has a current medication list which includes the following prescription(s): cosentyx sensoready (300 mg), diphenhydramine-acetaminophen, duloxetine, folic acid, gabapentin, ketoconazole, biofreeze, methotrexate, multivitamin with minerals, triamcinolone ointment, tuberculin syr 1cc/27gx1/2", amoxicillin-clavulanate, and fluconazole.   Allergies: Patient is allergic to aspirin.   Social History:  Social History   Tobacco Use  . Smoking status: Never Smoker  . Smokeless tobacco: Never Used  Vaping Use  . Vaping Use: Never used  Substance Use Topics  . Alcohol use: Never  . Drug use: Never    Relationship status: married  She lives with husband.   She is not employed. Regular exercise: No  Family History:   Family History  Problem Relation Age of Onset  . Diabetes Mother   . Hypertension Mother   . Heart Problems Mother   . Congestive Heart Failure Mother   . Diabetes Father   . Hypertension Father   . Stroke Sister   . Heart Problems Sister   . Prostate cancer Brother   . Diabetes Brother   . Asthma Daughter   . Healthy Daughter   . Healthy Son   . Healthy Son      Review  of Systems: Review of Systems  Constitutional: Negative for fever, malaise/fatigue and weight loss.  Respiratory: Negative for cough, shortness of breath and wheezing.   Cardiovascular: Negative for chest pain, palpitations and leg swelling.  Gastrointestinal: Negative for abdominal pain and blood in stool.  Genitourinary: Negative for dysuria.  Musculoskeletal: Positive for myalgias.  Skin: Positive for rash.  Neurological: Positive for headaches. Negative for dizziness.  Endo/Heme/Allergies: Bruises/bleeds easily.  Psychiatric/Behavioral: Negative for depression. The patient is not nervous/anxious.      OBJECTIVE Physical Exam: Vitals:   08/20/20 1313  BP: 115/75  Pulse: 76  Height: 5\' 5"  (1.651 m)    Physical Exam Constitutional:      General: She is not in acute distress. Pulmonary:     Effort: Pulmonary effort is normal.  Abdominal:     General: There is no distension.     Palpations: Abdomen is soft.     Tenderness: There is no abdominal tenderness. There is no rebound.  Musculoskeletal:        General: No swelling. Normal range of motion.  Skin:    General: Skin is warm and dry.     Findings: No rash.  Neurological:     Mental Status: She is alert and oriented to person, place, and time.  Psychiatric:        Mood and Affect: Mood normal.        Behavior: Behavior normal.      GU / Detailed Urogynecologic Evaluation:  Pelvic Exam: Normal external female genitalia; Bartholin's and Skene's glands normal in appearance; urethral meatus normal in appearance, no urethral masses or discharge.   CST: negative Speculum exam reveals normal vaginal mucosa without atrophy. Cervix normal appearance. Uterus normal single, nontender. Adnexa no mass, fullness, tenderness.     Pelvic floor strength III/V  Pelvic floor musculature: Right levator non-tender, Right obturator non-tender, Left levator non-tender, Left obturator non-tender  POP-Q:   POP-Q  0                                             Aa   0                                           Ba  -4.5                                              C   4  Gh  4                                            Pb  7                                            tvl   -2                                            Ap  -2                                            Bp  -6                                              D     Rectal Exam:  Normal external rectum  Post-Void Residual (PVR) by Bladder Scan: In order to evaluate bladder emptying, we discussed obtaining a postvoid residual and she agreed to this procedure.  Procedure: The ultrasound unit was placed on the patient's abdomen in the suprapubic region after the patient had voided. A PVR of 48 ml was obtained by bladder scan.  Laboratory Results: POC urine: negative  I visualized the urine specimen, noting the specimen to be clear yellow  ASSESSMENT AND PLAN Ms. Ackerley is a 60 y.o. with:  1. Levator spasm   2. Prolapse of anterior vaginal wall   3. Uterovaginal prolapse, incomplete   4. Urinary frequency    1. Levator spasm The origin of pelvic floor muscle spasm can be multifactorial, including primary, reactive to a different pain source, trauma, or even part of a centralized pain syndrome.Treatment options include pelvic floor physical therapy, local (vaginal) or oral  muscle relaxants, pelvic muscle trigger point injections or centrally acting pain medications.   - Palpation of pelvic floor muscles reproduced pain sensation - She would be interested in starting pelvic floor physical therapy, referral placed.   2. Stage II anterior, Stage I posterior, Stage I apical prolapse - She is overall asymptomatic from her prolapse. She would like to see how PT helps with her pain symptoms before exploring any treatment for her prolapse.   3. Frequency - mainly at night, POC urine negative for infection -  Not bothersome and does not desire treatment for this.   Jaquita Folds, MD   Medical Decision Making:  - Reviewed/ ordered a clinical laboratory test - Review and summation of prior records

## 2020-08-27 ENCOUNTER — Other Ambulatory Visit: Payer: Self-pay | Admitting: Physician Assistant

## 2020-08-27 NOTE — Telephone Encounter (Signed)
Next Visit: 10/01/2020  Last Visit: 07/02/2020  Last Fill: 07/02/2020  DX: Psoriatic arthritis   Current Dose per office note 07/02/2020, methotrexate 0.5 ml sq injections every 7 days  Labs: 07/23/2020, CBC WNL. Glucose is elevated-135. ALT is borderline elevated-43. Please advise the patient to avoid tylenol, NSAIDs, and alcohol. Please clarify if she has been taking tylenol PM?   Okay to refill MTX?

## 2020-09-17 NOTE — Progress Notes (Deleted)
Office Visit Note  Patient: Connie Hoffman             Date of Birth: February 14, 1961           MRN: [id number]             PCP: Jenel Lucks, PA-C Referring: Burna Cash* Visit Date: 10/01/2020 Occupation: @GUAROCC @  Subjective:    History of Present Illness: Connie Hoffman is a 60 y.o. female with history of psoriatic arthritis, osteoarthritis, and DDD.  She is on cosentyx 150 mg sq injections every 14 days, methotrexate 0.5 ml sq injections once weekly, and folic acid 1 mg daily.   CBC and CMP updated on 07/23/20.  TB gold negative on 05/01/20.   Activities of Daily Living:  Patient reports morning stiffness for *** {minute/hour:19697}.   Patient {ACTIONS;DENIES/REPORTS:21021675::"Denies"} nocturnal pain.  Difficulty dressing/grooming: {ACTIONS;DENIES/REPORTS:21021675::"Denies"} Difficulty climbing stairs: {ACTIONS;DENIES/REPORTS:21021675::"Denies"} Difficulty getting out of chair: {ACTIONS;DENIES/REPORTS:21021675::"Denies"} Difficulty using hands for taps, buttons, cutlery, and/or writing: {ACTIONS;DENIES/REPORTS:21021675::"Denies"}  No Rheumatology ROS completed.   PMFS History:  Patient Active Problem List   Diagnosis Date Noted  . Midline cystocele 05/23/2020  . Pelvic mass 12/04/2019  . DDD (degenerative disc disease), lumbar 01/21/2018  . Primary osteoarthritis of both hands 01/21/2018  . Primary osteoarthritis of both feet 01/21/2018  . Psoriasis 12/01/2017  . Psoriatic arthritis ([place]) 12/01/2017  . High risk medication use 12/01/2017  . 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 Thrombocytosis 12/01/2017  . 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 DDD (degenerative disc disease), lumbar   . Psoriatic arthritis ([place])   . Spinal stenosis     Family History  Problem Relation Age of Onset  . Diabetes Mother   . Hypertension Mother   . Heart Problems Mother   . Congestive Heart Failure  Mother   . Diabetes Father   . Hypertension Father   . Stroke Sister   . Heart Problems Sister   . Prostate cancer Brother   . Diabetes Brother   . Asthma Daughter   . Healthy Daughter   . Healthy Son   . Healthy Son    Past Surgical History:  Procedure Laterality Date  . BACK SURGERY  05/18/2019   L4 and L5  . CHOLECYSTECTOMY    . CYSTECTOMY  07/2017  . JOINT REPLACEMENT Left    left knee  . LAPAROSCOPIC ENDOMETRIOSIS FULGURATION  2011  . [place] RESECTION  2018  . LAPAROSCOPIC OOPHERECTOMY Right   . LUMBAR EPIDURAL INJECTION  04/13/2018  . REVISION TOTAL KNEE ARTHROPLASTY Left 12/2017  . TOTAL SHOULDER ARTHROPLASTY Bilateral   . TUBAL LIGATION     postpartum   Social History   Social History Narrative   Right handed    Lives with husband and 1 daughters    Immunization History  Administered Date(s) Administered  . Influenza,inj,Quad PF,6+ Mos 04/15/2018  . PFIZER(Purple Top)SARS-COV-2 Vaccination 07/15/2019, 08/05/2019, 03/13/2020  . Pneumococcal Polysaccharide-23 04/15/2018  . Zoster Recombinat (Shingrix) 05/13/2018, 10/25/2019     Objective: Vital Signs: There were no vitals taken for this visit.   Physical Exam Vitals and nursing note reviewed.  Constitutional:      Appearance: She is well-developed.  HENT:     Head: Normocephalic and atraumatic.  Eyes:     Conjunctiva/sclera: Conjunctivae normal.  Cardiovascular:     Rate and Rhythm: Normal rate.  Pulmonary:     Effort: Pulmonary effort is normal.  Abdominal:  Palpations: Abdomen is soft.  Musculoskeletal:     Cervical back: Normal range of motion.  Skin:    General: Skin is warm and dry.     Capillary Refill: Capillary refill takes less than 2 seconds.  Neurological:     Mental Status: She is alert and oriented to person, place, and time.  Psychiatric:        Behavior: Behavior normal.      Musculoskeletal Exam: ***  CDAI Exam: CDAI Score: -- Patient Global: --;  Provider Global: -- Swollen: --; Tender: -- Joint Exam 10/01/2020   No joint exam has been documented for this visit   There is currently no information documented on the homunculus. Go to the Rheumatology activity and complete the homunculus joint exam.  Investigation: No additional findings.  Imaging: No results found.  Recent Labs: Lab Results  Component Value Date   WBC 10.3 07/23/2020   HGB 12.1 07/23/2020   PLT 396 07/23/2020   NA 141 07/23/2020   K 5.1 07/23/2020   CL 103 07/23/2020   CO2 31 07/23/2020   GLUCOSE 135 (H) 07/23/2020   BUN 11 07/23/2020   CREATININE 0.76 07/23/2020   BILITOT 0.3 07/23/2020   ALKPHOS 100 10/22/2019   AST 28 07/23/2020   ALT 43 (H) 07/23/2020   PROT 6.5 07/23/2020   ALBUMIN 3.4 (L) 10/22/2019   CALCIUM 8.9 07/23/2020   GFRAA 101 07/23/2020   QFTBGOLDPLUS NEGATIVE 05/01/2020    Speciality Comments: Cosentyx start date:04/2018  Procedures:  No procedures performed Allergies: Aspirin   Assessment / Plan:     Visit Diagnoses: No diagnosis found.  Orders: No orders of the defined types were placed in this encounter.  No orders of the defined types were placed in this encounter.   Face-to-face time spent with patient was *** minutes. Greater than 50% of time was spent in counseling and coordination of care.  Follow-Up Instructions: No follow-ups on file.   Earnestine Mealing, CMA  Note - This record has been created using Editor, commissioning.  Chart creation errors have been sought, but may not always  have been located. Such creation errors do not reflect on  the standard of medical care.

## 2020-09-19 ENCOUNTER — Other Ambulatory Visit: Payer: Self-pay

## 2020-09-19 ENCOUNTER — Ambulatory Visit (INDEPENDENT_AMBULATORY_CARE_PROVIDER_SITE_OTHER): Payer: Medicare Other | Admitting: Neurology

## 2020-09-19 ENCOUNTER — Encounter: Payer: Self-pay | Admitting: Neurology

## 2020-09-19 VITALS — BP 125/84 | HR 78 | Ht 65.0 in | Wt 281.0 lb

## 2020-09-19 DIAGNOSIS — G5623 Lesion of ulnar nerve, bilateral upper limbs: Secondary | ICD-10-CM

## 2020-09-19 DIAGNOSIS — G43109 Migraine with aura, not intractable, without status migrainosus: Secondary | ICD-10-CM | POA: Diagnosis not present

## 2020-09-19 MED ORDER — GABAPENTIN 300 MG PO CAPS: 300.0000 mg | ORAL_CAPSULE | Freq: Every day | ORAL | 1 refills | Status: DC

## 2020-09-19 NOTE — Patient Instructions (Addendum)
Start gabapentin 300mg  at bedtime  Please call my office when you would like to start occupational therapy  Return to clinic in 4 months

## 2020-09-19 NOTE — Progress Notes (Signed)
Follow-up Visit   Date: 09/19/20   Connie Hoffman MRN: [id number] DOB: 03-06-1961   Interim History: Connie Hoffman is a 60 y.o. 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  The patient was accompanied to the clinic by self.  History of present illness: Starting early 2021, she began noticing numbness in both hands, with occasional tingling. Symptoms are noticeable when she wakes up or when she is resting.  She endorses weakness and difficulty opening jars/bottle.    She has tingling and cramping of the legs for the past 1-2 years and takes gabapentin 100mg  twice daily. She had tingling up to her knees, which is present at nighttime.  She was diagnosed with neuropathy in 2018 by [facility] while living in [place].  This was ordered by her pain management provider, who she was seeing for lumbar canal stenosis. She underwent lumbar surgery which has significantly helped her low back pain and tingling in her legs.  She continues to get muscle cramps at night time. She takes methocarbamol and tramadol for pain.    She takes methotrexate and Cosentyx for psoriatic arthritis.   UPDATE 06/26/2020:  She is here for follow-up and reports worsening numbness/tingling of the hands, despite using soft elbow pad and being careful about how she positions the arms.  She wakes up most morning with her hands falling asleep and finds herself often shaking them throughout the day to get relief.  Symptoms are not constant, but recur.  She also some weakness in the hands, especially with opening jars/bottles.    UPDATE 09/19/2020:  Over the past 6 months, she has developed piercing headaches, lasting any where from 10-45 minutes, which occurs daily. It is usually at the base of her head and behind the right ear.   It is worse with movement and in the evening.  She prefers quiet,  dark room.  She has some relief with ice application to her neck.  She endorses photophobia, phonophobia, and mild dizziness.  She does not treat the headaches.  Headache does not wake her up from sleeping.  No worsening with coughing/sneezing.  No prior history of headaches.   The numbness and tingling in the hands is unchanged.  She has not started occupational therapy.   Medications:  Current Outpatient Medications on File Prior to Visit  Medication Sig Dispense Refill  . COSENTYX SENSOREADY, 300 MG, 150 MG/ML SOAJ INJECT 300MG  SUBCUTANEOUSLY EVERY 4 WEEKS 6 mL 0  . diphenhydramine-acetaminophen (TYLENOL PM) 25-500 MG TABS tablet Take 1 tablet by mouth at bedtime as needed.    . DULoxetine (CYMBALTA) 30 MG capsule Take 30 mg by mouth daily.    . folic acid (FOLVITE) 1 MG tablet Take 1 mg by mouth daily.    Marland Kitchen gabapentin (NEURONTIN) 100 MG capsule Take 100 mg by mouth 3 (three) times daily.    Marland Kitchen ketoconazole (NIZORAL) 2 % cream Apply 1 application topically daily. 15 g 0  . Menthol, Topical Analgesic, (BIOFREEZE) 4 % GEL Apply 1 application topically See admin instructions. Apply to painful sites as directed    . methotrexate 50 MG/2ML injection INJECT 0.5 ML INTO THE SKIN ONCE A WEEKLY 6 mL 0  . Multiple Vitamin (MULTIVITAMIN WITH MINERALS) TABS tablet Take 1 tablet by mouth daily.    Marland Kitchen triamcinolone ointment (KENALOG) 0.1 % Apply 1 application topically 2 (two) times daily as needed (  for itching). Prn   0  . TUBERCULIN SYR 1CC/27GX1/2" 27G X 1/2" 1 ML MISC Use 1 syringe once weekly to inject methotrexate. 12 each 2  . OZEMPIC, 0.25 OR 0.5 MG/DOSE, 2 MG/1.5ML SOPN Inject into the skin.     No current facility-administered medications on file prior to visit.    Allergies:  Allergies  Allergen Reactions  . Aspirin Other (See Comments)    GI bleeding    Vital Signs:  BP 125/84   Pulse 78   Ht 5\' 5"  (1.651 m)   Wt 281 lb (127.5 kg)   SpO2 99%   BMI 46.76 kg/m    Neurological  Exam: MENTAL STATUS including orientation to time, place, person, recent and remote memory, attention span and concentration, language, and fund of knowledge is normal.  Speech is not dysarthric.  CRANIAL NERVES:  Normal conjugate, extra-ocular eye movements in all directions of gaze.  No ptosis. Face is symmetric. Tongue is midline.   MOTOR:  Motor strength is 5/5 in all extremities, except bilateral finger abductors are 4/5.  No atrophy, fasciculations or abnormal movements.  No pronator drift.  Tone is normal.    MSRs:  Reflexes are 2+/4 throughout.  SENSORY:  Intact to vibration and temperature throughout.  COORDINATION/GAIT:  Gait is mildly wide-based due to body habitus, stable unassisted   Data: [facility]/EMG of the arms 05/08/2020: 1. Bilateral ulnar neuropathy with slowing across the elbow, predominantly demyelinating, mild-to-moderate and worse on the right. 2. There is no evidence of a cervical radiculopathy or carpal tunnel syndrome affecting either upper extremity  IMPRESSION/PLAN: 1.  Cubital tunnel syndrome, right >> left  - Continue conservative therapies  - She will contact the office when ready to start occupational therapy.    2.  Atypical migraine  - Start gabapentin 300mg  at bedtime, which may also help with hand paresthesias  - MRI brain declined, will reconsider if symptoms change  Return to clinic in 4 months  Thank you for allowing me to participate in patient's care.  If I can answer any additional questions, I would be pleased to do so.    Sincerely,    Biance Moncrief K. Allena Katz, DO

## 2020-09-25 ENCOUNTER — Telehealth: Payer: Self-pay | Admitting: *Deleted

## 2020-09-25 NOTE — Telephone Encounter (Signed)
Received fax from [facility].  [facility] states they have been trying to reach patient for refill on Cosentyx. They have been unsuccessful. Patient advised and states she will contact them to set up shipment.

## 2020-10-01 ENCOUNTER — Ambulatory Visit: Payer: Medicare Other | Admitting: Physician Assistant

## 2020-10-01 DIAGNOSIS — Z9884 Bariatric surgery status: Secondary | ICD-10-CM

## 2020-10-01 DIAGNOSIS — M5136 Other intervertebral disc degeneration, lumbar region: Secondary | ICD-10-CM

## 2020-10-01 DIAGNOSIS — D75839 Thrombocytosis, unspecified: Secondary | ICD-10-CM

## 2020-10-01 DIAGNOSIS — Z96652 Presence of left artificial knee joint: Secondary | ICD-10-CM

## 2020-10-01 DIAGNOSIS — M1711 Unilateral primary osteoarthritis, right knee: Secondary | ICD-10-CM

## 2020-10-01 DIAGNOSIS — M19071 Primary osteoarthritis, right ankle and foot: Secondary | ICD-10-CM

## 2020-10-01 DIAGNOSIS — L409 Psoriasis, unspecified: Secondary | ICD-10-CM

## 2020-10-01 DIAGNOSIS — Z79899 Other long term (current) drug therapy: Secondary | ICD-10-CM

## 2020-10-01 DIAGNOSIS — R296 Repeated falls: Secondary | ICD-10-CM

## 2020-10-01 DIAGNOSIS — G5623 Lesion of ulnar nerve, bilateral upper limbs: Secondary | ICD-10-CM

## 2020-10-01 DIAGNOSIS — M19041 Primary osteoarthritis, right hand: Secondary | ICD-10-CM

## 2020-10-01 DIAGNOSIS — L4050 Arthropathic psoriasis, unspecified: Secondary | ICD-10-CM

## 2020-10-03 ENCOUNTER — Ambulatory Visit: Payer: Medicare Other | Admitting: Neurology

## 2020-10-07 NOTE — Progress Notes (Signed)
Office Visit Note  Patient: Connie Hoffman             Date of Birth: 29-Mar-1961           MRN: [id number]             PCP: Jenel Lucks, PA-C Referring: Burna Cash* Visit Date: 10/08/2020 Occupation: @GUAROCC @  Subjective:  Fatigue   History of Present Illness: Connie Hoffman is a 60 y.o. female with history of psoriatic arthritis and  osteoarthritis.  She is on cosentyx 150 mg sq injections once weekly, Methotrexate 0.7 ml sq injections once weekly, and folic acid 2 mg daily.  She has not missed any doses recently.  According to the patient she has been under a tremendous amount of stress recently since she had to move about 2 weeks ago.  She has noticed increased fatigue on a daily basis.  Her fatigue has been secondary to worsening insomnia. She has been experiencing more frequent nocturnal muscle cramps in her legs which has been contributing to her insomnia.  She has been having difficulty falling asleep at night and has only been sleeping about 3 to 4 hours per night.  She was recently started on gabapentin 300 mg 1 capsule at bedtime by Dr. Posey Pronto.  She denies any increased joint pain or joint swelling recently.  Her morning stiffness has been lasting about 1 hour.  She denies any Achilles tendinitis or plantar fasciitis.  She has not had any SI joint discomfort.    Activities of Daily Living:  Patient reports morning stiffness for 1 hour.   Patient Reports nocturnal pain.  Difficulty dressing/grooming: Denies Difficulty climbing stairs: Reports Difficulty getting out of chair: Reports Difficulty using hands for taps, buttons, cutlery, and/or writing: Reports  Review of Systems  Constitutional:  Positive for fatigue.  HENT:  Positive for mouth dryness.   Eyes:  Positive for dryness.  Respiratory:  Negative for shortness of breath.   Cardiovascular:  Positive for swelling in legs/feet.  Gastrointestinal:  Negative for constipation.  Endocrine:  Positive for cold intolerance and excessive thirst.  Genitourinary:  Negative for difficulty urinating.  Musculoskeletal:  Positive for joint pain, gait problem, joint pain, joint swelling, muscle weakness and morning stiffness.  Skin:  Negative for rash.  Allergic/Immunologic: Negative for susceptible to infections.  Neurological:  Positive for numbness and weakness.  Hematological:  Positive for bruising/bleeding tendency.  Psychiatric/Behavioral:  Positive for sleep disturbance.    PMFS History:  Patient Active Problem List   Diagnosis Date Noted   Midline cystocele 05/23/2020   Pelvic mass 12/04/2019   DDD (degenerative disc disease), lumbar 01/21/2018   Primary osteoarthritis of both hands 01/21/2018   Primary osteoarthritis of both feet 01/21/2018   Psoriasis 12/01/2017   Psoriatic arthritis ([place]) 12/01/2017   High risk medication use 12/01/2017   S/P total knee replacement, 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   Thrombocytosis 12/01/2017   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    Past Medical History:  Diagnosis Date   DDD (degenerative disc disease), lumbar    Psoriatic arthritis ([place])    Spinal stenosis     Family History  Problem Relation Age of Onset   Diabetes Mother    Hypertension Mother    Heart Problems Mother    Congestive Heart Failure Mother    Diabetes Father    Hypertension Father    Stroke Sister    Heart Problems Sister    Prostate cancer Brother    Diabetes Brother  Asthma Daughter    Healthy Daughter    Healthy Son    Healthy Son    Past Surgical History:  Procedure Laterality Date   BACK SURGERY  05/18/2019   L4 and L5   CHOLECYSTECTOMY     CYSTECTOMY  07/2017   JOINT REPLACEMENT Left    left knee   LAPAROSCOPIC ENDOMETRIOSIS FULGURATION  2011   LAPAROSCOPIC GASTRIC SLEEVE RESECTION  2018   LAPAROSCOPIC OOPHERECTOMY Right    LUMBAR EPIDURAL INJECTION  04/13/2018   REVISION TOTAL KNEE ARTHROPLASTY Left 12/2017   TOTAL SHOULDER ARTHROPLASTY Bilateral     TUBAL LIGATION     postpartum   Social History   Social History Narrative   Right handed    Lives with husband and 1 daughters    Immunization History  Administered Date(s) Administered   Influenza,inj,Quad PF,6+ Mos 04/15/2018   PFIZER(Purple Top)SARS-COV-2 Vaccination 07/15/2019, 08/05/2019, 03/13/2020   Pneumococcal Polysaccharide-23 04/15/2018   Zoster Recombinat (Shingrix) 05/13/2018, 10/25/2019     Objective: Vital Signs: BP 117/74 (BP Location: Left Arm, Patient Position: Sitting, Cuff Size: Large)   Pulse 75   Resp 17   Ht 5\' 5"  (1.651 m)   Wt 278 lb (126.1 kg)   BMI 46.26 kg/m    Physical Exam Vitals and nursing note reviewed.  Constitutional:      Appearance: She is well-developed.  HENT:     Head: Normocephalic and atraumatic.  Eyes:     Conjunctiva/sclera: Conjunctivae normal.  Pulmonary:     Effort: Pulmonary effort is normal.  Abdominal:     Palpations: Abdomen is soft.  Musculoskeletal:     Cervical back: Normal range of motion.  Skin:    General: Skin is warm and dry.     Capillary Refill: Capillary refill takes less than 2 seconds.  Neurological:     Mental Status: She is alert and oriented to person, place, and time.  Psychiatric:        Behavior: Behavior normal.     Musculoskeletal Exam: C-spine, thoracic spine, and lumbar spine have good ROM. No SI joint tenderness.  Shoulder joints, elbow joints, wrist joints, MCPs, PIPs, and DIPs good ROM with no synovitis.  Complete fist formation bilaterally. Hip joints, knee joints, and ankle joints good ROM with no discomfort.  No warmth or effusion of knee joints.  No tenderness or swelling of ankle joints. No evidence of achilles tendonitis or plantar fasciitis.    CDAI Exam: CDAI Score: -- Patient Global: --; Provider Global: -- Swollen: --; Tender: -- Joint Exam 10/08/2020   No joint exam has been documented for this visit   There is currently no information documented on the homunculus. Go  to the Rheumatology activity and complete the homunculus joint exam.  Investigation: No additional findings.  Imaging: No results found.  Recent Labs: Lab Results  Component Value Date   WBC 10.3 07/23/2020   HGB 12.1 07/23/2020   PLT 396 07/23/2020   NA 141 07/23/2020   K 5.1 07/23/2020   CL 103 07/23/2020   CO2 31 07/23/2020   GLUCOSE 135 (H) 07/23/2020   BUN 11 07/23/2020   CREATININE 0.76 07/23/2020   BILITOT 0.3 07/23/2020   ALKPHOS 100 10/22/2019   AST 28 07/23/2020   ALT 43 (H) 07/23/2020   PROT 6.5 07/23/2020   ALBUMIN 3.4 (L) 10/22/2019   CALCIUM 8.9 07/23/2020   GFRAA 101 07/23/2020   QFTBGOLDPLUS NEGATIVE 05/01/2020    Speciality Comments: Cosentyx start date:04/2018  Procedures:  No procedures performed Allergies: Aspirin   Assessment / Plan:     Visit Diagnoses: Psoriatic arthritis ([place]): She has no synovitis or dactylitis on examination today.  She has not had any recent psoriatic arthritis flares.  She has no evidence of Achilles tendinitis or plantar fasciitis.  No SI joint tenderness to palpation.  Overall she has clinically been doing well on Cosentyx 150 mg subcutaneous injections every 14 days, methotrexate 0.7 mL sq injections once weekly, and folic acid 2 mg by mouth daily.  She has noticed an improvement in her pain and stiffness since increasing the dose of methotrexate after her last office visit on 07/02/2020.  She will remain on the current treatment regimen.  She does not need any refills at this time.  She was advised to notify us if she develops increased joint pain or joint swelling.  She will follow-up in the office in 5 months.  Psoriasis: She has no active psoriasis at this time.  Hyperpigmentation under both breasts is unchanged.  High risk medication use - Cosentyx 150 mg sq injections once every 14 days, methotrexate 0.7 ml sq injections every 7 days, and folic acid 1 mg daily.   CBC and CMP updated on 07/23/20.  Orders for CBC and CMP were  released today.  Her next lab work will be due in September and every 3 months to monitor for drug toxicity.  Standing orders for CBC and CMP remain in place.  TB gold negative on 05/01/20 and will continue to be monitored yearly.  - Plan: CBC with Differential/Platelet, COMPLETE METABOLIC PANEL WITH GFR Discussed the importance of holding Cosentyx and methotrexate if she develops signs or symptoms of an infection and to resume once the infection has completely cleared.  Thrombocytosis: Platelet count was within normal limits on 07/23/2020.  She is due to update lab work so CBC was released today.  Primary osteoarthritis of both hands: She has mild osteoarthritic changes in both hands.  No tenderness or inflammation was noted on examination today.  She was able to make a complete fist with no discomfort.  We discussed the importance of joint protection and muscle strengthening.  Ulnar neuropathy of both upper extremities - She was evaluated by Dr. Posey Pronto on 03/28/2020.  Findings were consistent with bilateral ulnar neuropathy. She was started on gabapentin 300 mg 1 capsule by mouth daily which has started to improve her symptoms.  S/P total knee replacement, left: Doing well.  She has good ROM with no discomfort at this time.  Primary osteoarthritis of right knee: Chronic pain.  She has painful range of motion on examination today.  No warmth or effusion was noted.  She is not ready to proceed with a right knee replacement at this time.  Primary osteoarthritis of both feet: She is not experiencing any discomfort in her feet at this time.  She has good ROM of both ankle joints with no tenderness or inflammation.  No evidence of achilles tendonitis or plantar fasciitis.   Nocturnal muscle cramps - She has been experiencing more frequent nocturnal muscle cramps in bilateral lower extremities.  We will check CMP and magnesium level today.  We discussed trying magnesium malate 250 mg at bedtime for muscle  spasms.  Discussed possible side effects including diarrhea and drowsiness.  plan: COMPLETE METABOLIC PANEL WITH GFR, Magnesium  DDD (degenerative disc disease), lumbar: She experiences intermittent discomfort in her lower back.  She has no symptoms of radiculopathy at this time.  No midline spinal  tenderness or SI joint tenderness was noted on examination today.  Other medical conditions are listed as follows:   History of gastric bypass  Frequent falls -Underwent workup by Dr. Posey Pronto.     Orders: Orders Placed This Encounter  Procedures   CBC with Differential/Platelet   COMPLETE METABOLIC PANEL WITH GFR   Magnesium   No orders of the defined types were placed in this encounter.     Follow-Up Instructions: Return in about 3 months (around 01/08/2021) for Psoriatic arthritis, Osteoarthritis.   Ofilia Neas, PA-C  Note - This record has been created using Dragon software.  Chart creation errors have been sought, but may not always  have been located. Such creation errors do not reflect on  the standard of medical care.

## 2020-10-08 ENCOUNTER — Ambulatory Visit (INDEPENDENT_AMBULATORY_CARE_PROVIDER_SITE_OTHER): Payer: Medicare Other | Admitting: Physician Assistant

## 2020-10-08 ENCOUNTER — Other Ambulatory Visit: Payer: Self-pay

## 2020-10-08 ENCOUNTER — Encounter: Payer: Self-pay | Admitting: Physician Assistant

## 2020-10-08 VITALS — BP 117/74 | HR 75 | Resp 17 | Ht 65.0 in | Wt 278.0 lb

## 2020-10-08 DIAGNOSIS — D75839 Thrombocytosis, unspecified: Secondary | ICD-10-CM

## 2020-10-08 DIAGNOSIS — R252 Cramp and spasm: Secondary | ICD-10-CM

## 2020-10-08 DIAGNOSIS — M1711 Unilateral primary osteoarthritis, right knee: Secondary | ICD-10-CM

## 2020-10-08 DIAGNOSIS — M19041 Primary osteoarthritis, right hand: Secondary | ICD-10-CM

## 2020-10-08 DIAGNOSIS — L4050 Arthropathic psoriasis, unspecified: Secondary | ICD-10-CM

## 2020-10-08 DIAGNOSIS — M19072 Primary osteoarthritis, left ankle and foot: Secondary | ICD-10-CM

## 2020-10-08 DIAGNOSIS — R296 Repeated falls: Secondary | ICD-10-CM

## 2020-10-08 DIAGNOSIS — Z96652 Presence of left artificial knee joint: Secondary | ICD-10-CM

## 2020-10-08 DIAGNOSIS — Z9884 Bariatric surgery status: Secondary | ICD-10-CM

## 2020-10-08 DIAGNOSIS — L409 Psoriasis, unspecified: Secondary | ICD-10-CM

## 2020-10-08 DIAGNOSIS — Z79899 Other long term (current) drug therapy: Secondary | ICD-10-CM | POA: Diagnosis not present

## 2020-10-08 DIAGNOSIS — M19071 Primary osteoarthritis, right ankle and foot: Secondary | ICD-10-CM

## 2020-10-08 DIAGNOSIS — M51369 Other intervertebral disc degeneration, lumbar region without mention of lumbar back pain or lower extremity pain: Secondary | ICD-10-CM

## 2020-10-08 DIAGNOSIS — M5136 Other intervertebral disc degeneration, lumbar region: Secondary | ICD-10-CM

## 2020-10-08 DIAGNOSIS — G5623 Lesion of ulnar nerve, bilateral upper limbs: Secondary | ICD-10-CM

## 2020-10-08 DIAGNOSIS — M19042 Primary osteoarthritis, left hand: Secondary | ICD-10-CM

## 2020-10-09 LAB — COMPLETE METABOLIC PANEL WITH GFR
AG Ratio: 1.3 (calc) (ref 1.0–2.5)
ALT: 18 U/L (ref 6–29)
AST: 16 U/L (ref 10–35)
Albumin: 4.1 g/dL (ref 3.6–5.1)
Alkaline phosphatase (APISO): 95 U/L (ref 37–153)
BUN: 10 mg/dL (ref 7–25)
CO2: 29 mmol/L (ref 20–32)
Calcium: 9.7 mg/dL (ref 8.6–10.4)
Chloride: 106 mmol/L (ref 98–110)
Creat: 0.74 mg/dL (ref 0.50–1.05)
GFR, Est African American: 103 mL/min/{1.73_m2} (ref >=60)
GFR, Est Non African American: 89 mL/min/{1.73_m2} (ref >=60)
Globulin: 3.2 g/dL (calc) (ref 1.9–3.7)
Glucose, Bld: 87 mg/dL (ref 65–99)
Potassium: 4.5 mmol/L (ref 3.5–5.3)
Sodium: 145 mmol/L (ref 135–146)
Total Bilirubin: 0.3 mg/dL (ref 0.2–1.2)
Total Protein: 7.3 g/dL (ref 6.1–8.1)

## 2020-10-09 LAB — CBC WITH DIFFERENTIAL/PLATELET
Absolute Monocytes: 558 cells/uL (ref 200–950)
Basophils Absolute: 19 cells/uL (ref 0–200)
Basophils Relative: 0.3 %
Eosinophils Absolute: 31 cells/uL (ref 15–500)
Eosinophils Relative: 0.5 %
HCT: 40.6 % (ref 35.0–45.0)
Hemoglobin: 12.9 g/dL (ref 11.7–15.5)
Lymphs Abs: 1420 cells/uL (ref 850–3900)
MCH: 29.7 pg (ref 27.0–33.0)
MCHC: 31.8 g/dL — ABNORMAL LOW (ref 32.0–36.0)
MCV: 93.5 fL (ref 80.0–100.0)
MPV: 10 fL (ref 7.5–12.5)
Monocytes Relative: 9 %
Neutro Abs: 4173 cells/uL (ref 1500–7800)
Neutrophils Relative %: 67.3 %
Platelets: 376 10*3/uL (ref 140–400)
RBC: 4.34 10*6/uL (ref 3.80–5.10)
RDW: 14 % (ref 11.0–15.0)
Total Lymphocyte: 22.9 %
WBC: 6.2 10*3/uL (ref 3.8–10.8)

## 2020-10-09 LAB — MAGNESIUM: Magnesium: 2.4 mg/dL (ref 1.5–2.5)

## 2020-10-09 NOTE — Progress Notes (Signed)
CBC and CMP WNL.  Magnesium is WNL.

## 2020-10-22 ENCOUNTER — Emergency Department (HOSPITAL_COMMUNITY)
Admission: EM | Admit: 2020-10-22 | Discharge: 2020-10-23 | Discharge status: Against Medical Advice | Payer: Medicare Other | Attending: Emergency Medicine | Admitting: Emergency Medicine

## 2020-10-22 ENCOUNTER — Other Ambulatory Visit: Payer: Self-pay

## 2020-10-22 ENCOUNTER — Emergency Department (HOSPITAL_COMMUNITY): Payer: Medicare Other

## 2020-10-22 ENCOUNTER — Encounter (HOSPITAL_COMMUNITY): Payer: Self-pay | Admitting: Emergency Medicine

## 2020-10-22 DIAGNOSIS — Z96611 Presence of right artificial shoulder joint: Secondary | ICD-10-CM | POA: Insufficient documentation

## 2020-10-22 DIAGNOSIS — Z96612 Presence of left artificial shoulder joint: Secondary | ICD-10-CM | POA: Diagnosis not present

## 2020-10-22 DIAGNOSIS — R0602 Shortness of breath: Secondary | ICD-10-CM | POA: Insufficient documentation

## 2020-10-22 DIAGNOSIS — R079 Chest pain, unspecified: Secondary | ICD-10-CM | POA: Diagnosis present

## 2020-10-22 DIAGNOSIS — Z96652 Presence of left artificial knee joint: Secondary | ICD-10-CM | POA: Insufficient documentation

## 2020-10-22 LAB — BASIC METABOLIC PANEL
Anion gap: 8 (ref 5–15)
BUN: 11 mg/dL (ref 6–20)
CO2: 29 mmol/L (ref 22–32)
Calcium: 9.2 mg/dL (ref 8.9–10.3)
Chloride: 105 mmol/L (ref 98–111)
Creatinine, Ser: 0.73 mg/dL (ref 0.44–1.00)
GFR, Estimated: >60 mL/min (ref >60)
Glucose, Bld: 101 mg/dL — ABNORMAL HIGH (ref 70–99)
Potassium: 4 mmol/L (ref 3.5–5.1)
Sodium: 142 mmol/L (ref 135–145)

## 2020-10-22 LAB — TROPONIN I (HIGH SENSITIVITY)
Troponin I (High Sensitivity): 7 ng/L (ref <18)
Troponin I (High Sensitivity): 9 ng/L (ref <18)

## 2020-10-22 LAB — CBC
HCT: 38.1 % (ref 36.0–46.0)
Hemoglobin: 12.2 g/dL (ref 12.0–15.0)
MCH: 30.7 pg (ref 26.0–34.0)
MCHC: 32 g/dL (ref 30.0–36.0)
MCV: 96 fL (ref 80.0–100.0)
Platelets: 406 10*3/uL — ABNORMAL HIGH (ref 150–400)
RBC: 3.97 MIL/uL (ref 3.87–5.11)
RDW: 14.4 % (ref 11.5–15.5)
WBC: 9.3 10*3/uL (ref 4.0–10.5)
nRBC: 0 % (ref 0.0–0.2)

## 2020-10-22 IMAGING — CR DG CHEST 2V
2 series · 2 of 2 positions shown · non-contrast
Comparison: [DATE]

CLINICAL DATA: Chest pain

EXAM:
CHEST - 2 VIEW

[chest pa]
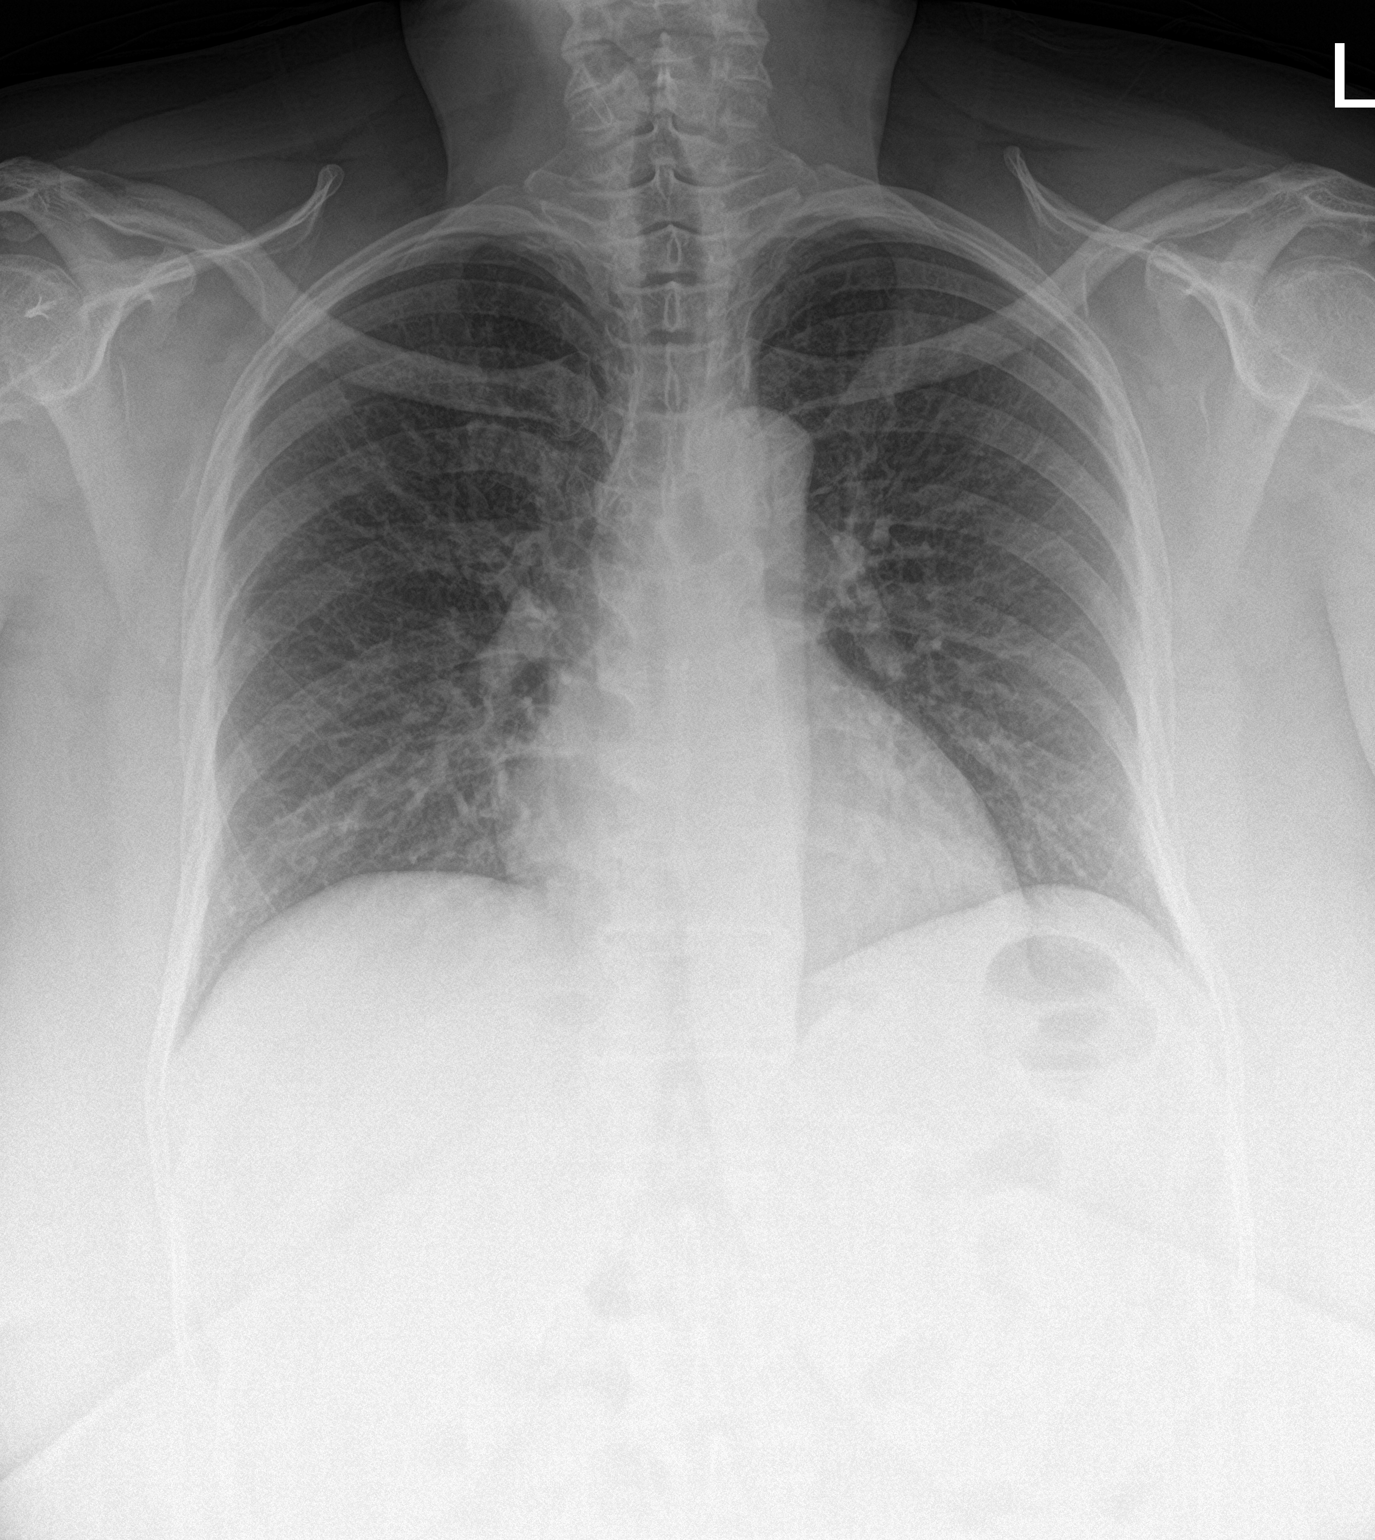

[chest lat]
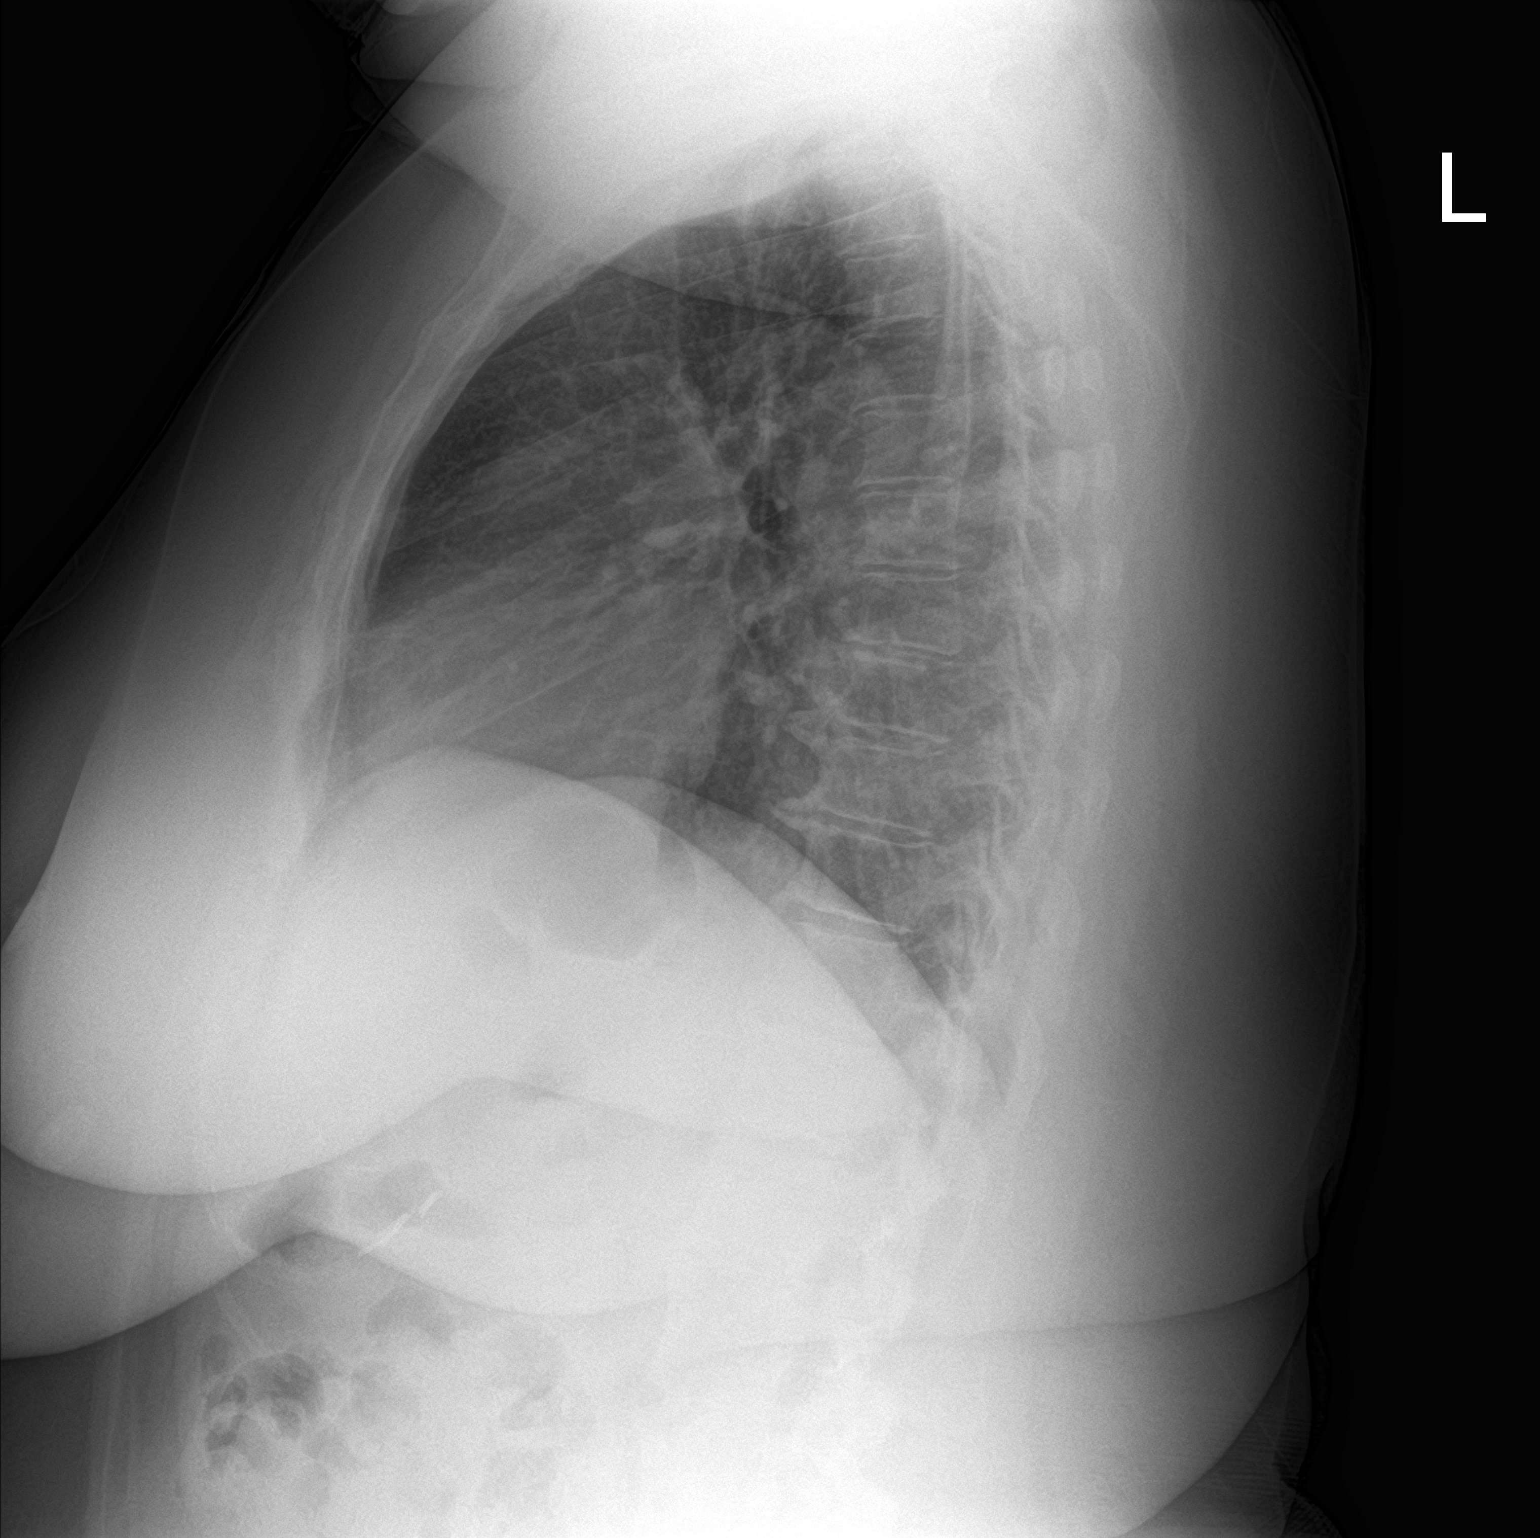

[2 of 2 positions shown; findings below may reference images not displayed]

FINDINGS: The heart size and mediastinal contours are within normal limits.
Both lungs are clear. The visualized skeletal structures are
unremarkable.
IMPRESSION: No active cardiopulmonary disease.

## 2020-10-22 NOTE — ED Notes (Signed)
Pt left AMA °

## 2020-10-22 NOTE — ED Triage Notes (Signed)
Reports exertional pain to center and R chest since Sunday with SOB.  Denies nausea and vomiting.

## 2020-10-22 NOTE — ED Provider Notes (Signed)
Emergency Medicine Provider Triage Evaluation Note  Connie Hoffman , a 60 y.o. female  was evaluated in triage.  Pt complains of chest pain. Sob noted as well. No nv.  Review of Systems  Positive: Chest pain, sob Negative: Nv  Physical Exam  BP (!) 155/76   Pulse 85   Temp 97.9 F (36.6 C)   Resp 18   SpO2 100%  Gen:   Awake, no distress   Resp:  Normal effort  MSK:   Moves extremities without difficulty  Other:  Heart rrr  Medical Decision Making  Medically screening exam initiated at 6:21 PM.  Appropriate orders placed.  Connie Hoffman was informed that the remainder of the evaluation will be completed by another provider, this initial triage assessment does not replace that evaluation, and the importance of remaining in the ED until their evaluation is complete.     Connie Hoffman 10/22/20 Connie Flakes, MD 10/22/20 [phone]

## 2020-10-23 ENCOUNTER — Other Ambulatory Visit: Payer: Self-pay

## 2020-10-23 ENCOUNTER — Encounter (HOSPITAL_COMMUNITY): Payer: Self-pay

## 2020-10-23 ENCOUNTER — Emergency Department (HOSPITAL_COMMUNITY)
Admission: EM | Admit: 2020-10-23 | Discharge: 2020-10-23 | Disposition: A | Discharge status: Home / Self Care | Payer: Medicare Other | Source: Home / Self Care | Attending: Emergency Medicine | Admitting: Emergency Medicine

## 2020-10-23 DIAGNOSIS — R0602 Shortness of breath: Secondary | ICD-10-CM | POA: Insufficient documentation

## 2020-10-23 DIAGNOSIS — Z96612 Presence of left artificial shoulder joint: Secondary | ICD-10-CM | POA: Insufficient documentation

## 2020-10-23 DIAGNOSIS — Z96611 Presence of right artificial shoulder joint: Secondary | ICD-10-CM | POA: Insufficient documentation

## 2020-10-23 DIAGNOSIS — R079 Chest pain, unspecified: Secondary | ICD-10-CM | POA: Insufficient documentation

## 2020-10-23 DIAGNOSIS — Z96652 Presence of left artificial knee joint: Secondary | ICD-10-CM | POA: Insufficient documentation

## 2020-10-23 LAB — CBC
HCT: 38.8 % (ref 36.0–46.0)
Hemoglobin: 12.2 g/dL (ref 12.0–15.0)
MCH: 30.3 pg (ref 26.0–34.0)
MCHC: 31.4 g/dL (ref 30.0–36.0)
MCV: 96.3 fL (ref 80.0–100.0)
Platelets: 405 10*3/uL — ABNORMAL HIGH (ref 150–400)
RBC: 4.03 MIL/uL (ref 3.87–5.11)
RDW: 14.4 % (ref 11.5–15.5)
WBC: 7.6 10*3/uL (ref 4.0–10.5)
nRBC: 0 % (ref 0.0–0.2)

## 2020-10-23 LAB — COMPREHENSIVE METABOLIC PANEL
ALT: 29 U/L (ref 0–44)
AST: 21 U/L (ref 15–41)
Albumin: 3.4 g/dL — ABNORMAL LOW (ref 3.5–5.0)
Alkaline Phosphatase: 99 U/L (ref 38–126)
Anion gap: 7 (ref 5–15)
BUN: 10 mg/dL (ref 6–20)
CO2: 28 mmol/L (ref 22–32)
Calcium: 9.1 mg/dL (ref 8.9–10.3)
Chloride: 106 mmol/L (ref 98–111)
Creatinine, Ser: 0.72 mg/dL (ref 0.44–1.00)
GFR, Estimated: >60 mL/min (ref >60)
Glucose, Bld: 96 mg/dL (ref 70–99)
Potassium: 4.1 mmol/L (ref 3.5–5.1)
Sodium: 141 mmol/L (ref 135–145)
Total Bilirubin: 0.6 mg/dL (ref 0.3–1.2)
Total Protein: 6.9 g/dL (ref 6.5–8.1)

## 2020-10-23 LAB — TROPONIN I (HIGH SENSITIVITY)
Troponin I (High Sensitivity): 6 ng/L (ref <18)
Troponin I (High Sensitivity): 11 ng/L (ref <18)
Troponin I (High Sensitivity): 6 ng/L (ref <18)

## 2020-10-23 LAB — BRAIN NATRIURETIC PEPTIDE: B Natriuretic Peptide: 59.7 pg/mL (ref 0.0–100.0)

## 2020-10-23 NOTE — Discharge Instructions (Signed)
Your work-up today is largely reassuring, but I am concerned about angina.  You will need to go see your cardiologist on Saturday, as scheduled.  Try to mitigate any significant physical exertion until you are seen by your cardiologist.  Return to the ER seek immediate medical attention should you experience any new or worsening symptoms.

## 2020-10-23 NOTE — ED Provider Notes (Signed)
[facility] EMERGENCY DEPARTMENT Provider Note   CSN: [id number] Arrival date & time: 10/23/20  0930     History Chief Complaint  Patient presents with   Chest Pain    Connie Hoffman is a 60 y.o. female with past medical history of psoriatic arthritis on methotrexate who presents the ED with complaints of exertional chest pain.  On my examination, patient reports that she came to the ED yesterday given her exertional chest pain and shortness of breath symptoms.  She eloped after medical screening exam and hoped that she would feel better, but this morning she woke up and continued to have exertional symptoms prompting her to return to the ED.  No family history of premature cardiac death, but she does have extensive cardiac history in her family which prompted her concern.  She denies any personal history of exertional syncope.  She states that her blood pressures and lipids are well controlled without medical management.  Patient is accompanied by her husband was at bedside.  She had a viral illness a few weeks ago, has been having exertional shortness of breath and chest pain ever since.  She then admits however that she has been experiencing similar symptoms for months and has already been previously seen by cardiology at [facility].  She reports that she had a cardiac stress test that was reassuring couple of months ago.  She has an outpatient follow-up appointment with them this weekend.  Denies any history of clots or clotting disorder, extremity swelling or edema, cough, active chest pain, abdominal pain, nausea or vomiting, diaphoresis, or other symptoms.  No history of illicit drug use or smoking.  HPI   Past Medical History:  Diagnosis Date   DDD (degenerative disc disease), lumbar    Psoriatic arthritis ([place])    Spinal stenosis     Patient Active Problem List   Diagnosis Date Noted   Midline cystocele 05/23/2020   Pelvic mass  12/04/2019   DDD (degenerative disc disease), lumbar 01/21/2018   Primary osteoarthritis of both hands 01/21/2018   Primary osteoarthritis of both feet 01/21/2018   Psoriasis 12/01/2017   Psoriatic arthritis ([place]) 12/01/2017   High risk medication use 12/01/2017   S/P total knee replacement, 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   Thrombocytosis 12/01/2017   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    Past Surgical History:  Procedure Laterality Date   BACK SURGERY  05/18/2019   L4 and L5   CHOLECYSTECTOMY     CYSTECTOMY  07/2017   JOINT REPLACEMENT Left    left knee   LAPAROSCOPIC ENDOMETRIOSIS FULGURATION  2011   LAPAROSCOPIC GASTRIC SLEEVE RESECTION  2018   LAPAROSCOPIC OOPHERECTOMY Right    LUMBAR EPIDURAL INJECTION  04/13/2018   REVISION TOTAL KNEE ARTHROPLASTY Left 12/2017   TOTAL SHOULDER ARTHROPLASTY Bilateral    TUBAL LIGATION     postpartum     OB History     Gravida  3   Para      Term      Preterm      AB      Living  3      SAB      IAB      Ectopic      Multiple      Live Births  3        Obstetric Comments  Vaginal deliveries x 3.          Family History  Problem Relation Age of Onset   Diabetes  Mother    Hypertension Mother    Heart Problems Mother    Congestive Heart Failure Mother    Diabetes Father    Hypertension Father    Stroke Sister    Heart Problems Sister    Prostate cancer Brother    Diabetes Brother    Asthma Daughter    Healthy Daughter    Healthy Son    Healthy Son     Social History   Tobacco Use   Smoking status: Never   Smokeless tobacco: Never  Vaping Use   Vaping Use: Never used  Substance Use Topics   Alcohol use: Never   Drug use: Never    Home Medications Prior to Admission medications   Medication Sig Start Date End Date Taking? Authorizing Provider  COSENTYX SENSOREADY, 300 MG, 150 MG/ML SOAJ INJECT 300MG  SUBCUTANEOUSLY EVERY 4 WEEKS Patient taking differently: Inject 150-300 mg into the skin every 14  (fourteen) days. 07/18/20  Yes Ofilia Neas, PA-C  DULoxetine (CYMBALTA) 30 MG capsule Take 30 mg by mouth at bedtime.   Yes [provider]  folic acid (FOLVITE) 1 MG tablet Take 1-2 mg by mouth daily.   Yes [provider]  gabapentin (NEURONTIN) 100 MG capsule Take 100 mg by mouth at bedtime.   Yes [provider]  gabapentin (NEURONTIN) 300 MG capsule Take 1 capsule (300 mg total) by mouth at bedtime. Patient taking differently: Take 600 mg by mouth in the morning and at bedtime. 09/19/20  Yes Patel, Donika K, DO  ketoconazole (NIZORAL) 2 % cream Apply 1 application topically daily. Patient taking differently: Apply 1 application topically daily as needed for irritation. 07/27/19  Yes Ofilia Neas, PA-C  Menthol, Topical Analgesic, (BIOFREEZE) 4 % GEL Apply 1 application topically See admin instructions. Apply to painful sites daily as directed   Yes [provider]  methocarbamol (ROBAXIN) 500 MG tablet Take 500-1,000 mg by mouth 2 (two) times daily as needed for muscle spasms. 06/19/20  Yes [provider]  Methotrexate 25 MG/ML SOSY Inject 17.5 mg into the skin every Wednesday.   Yes [provider]  Multiple Vitamin (MULTIVITAMIN WITH MINERALS) TABS tablet Take 1 tablet by mouth daily.   Yes [provider]  OZEMPIC, 0.25 OR 0.5 MG/DOSE, 2 MG/1.5ML SOPN Inject 2 mg into the skin every Saturday. 09/03/20  Yes [provider]  triamcinolone ointment (KENALOG) 0.1 % Apply 1 application topically 2 (two) times daily as needed (to affected sites- for itching). 10/05/17  Yes [provider]  methotrexate 50 MG/2ML injection INJECT 0.5 ML INTO THE SKIN ONCE A WEEKLY 08/27/20   Ofilia Neas, PA-C  TUBERCULIN SYR 1CC/27GX1/2" 27G X 1/2" 1 ML MISC Use 1 syringe once weekly to inject methotrexate. 01/05/20   Ofilia Neas, PA-C    Allergies    Aspirin and Tylenol [acetaminophen]  Review of Systems   Review of Systems   All other systems reviewed and are negative.  Physical Exam Updated Vital Signs BP 131/81   Pulse 71   Temp 97.6 F (36.4 C) (Oral)   Resp 20   SpO2 100%   Physical Exam Vitals and nursing note reviewed. Exam conducted with a chaperone present.  Constitutional:      General: She is not in acute distress.    Appearance: Normal appearance. She is not ill-appearing.  HENT:     Head: Normocephalic and atraumatic.  Eyes:     General: No scleral icterus.  Conjunctiva/sclera: Conjunctivae normal.  Cardiovascular:     Rate and Rhythm: Normal rate and regular rhythm.     Pulses: Normal pulses.     Heart sounds: Normal heart sounds.     Comments: Radial pulses intact and symmetric. Pulmonary:     Effort: Pulmonary effort is normal. No respiratory distress.  Abdominal:     General: Abdomen is flat. There is no distension.     Palpations: Abdomen is soft.     Tenderness: There is no abdominal tenderness.  Musculoskeletal:     Right lower leg: No edema.     Left lower leg: No edema.  Skin:    General: Skin is dry.  Neurological:     Mental Status: She is alert and oriented to person, place, and time.     GCS: GCS eye subscore is 4. GCS verbal subscore is 5. GCS motor subscore is 6.  Psychiatric:        Mood and Affect: Mood normal.        Behavior: Behavior normal.        Thought Content: Thought content normal.    ED Results / Procedures / Treatments   Labs (all labs ordered are listed, but only abnormal results are displayed) Labs Reviewed  CBC - Abnormal; Notable for the following components:      Result Value   Platelets 405 (*)    All other components within normal limits  COMPREHENSIVE METABOLIC PANEL - Abnormal; Notable for the following components:   Albumin 3.4 (*)    All other components within normal limits  BRAIN NATRIURETIC PEPTIDE  TROPONIN I (HIGH SENSITIVITY)  TROPONIN I (HIGH SENSITIVITY)  TROPONIN I (HIGH SENSITIVITY)     EKG None  Radiology DG Chest 2 View  Result Date: 10/22/2020 CLINICAL DATA:  Chest pain EXAM: CHEST - 2 VIEW COMPARISON:  01/19/2019 FINDINGS: The heart size and mediastinal contours are within normal limits. Both lungs are clear. The visualized skeletal structures are unremarkable. IMPRESSION: No active cardiopulmonary disease. Electronically Signed   By: Rolm Baptise M.D.   On: 10/22/2020 19:10    Procedures Procedures   Medications Ordered in ED Medications - No data to display  ED Course  I have reviewed the triage vital signs and the nursing notes.  Pertinent labs & imaging results that were available during my care of the patient were reviewed by me and considered in my medical decision making (see chart for details).    MDM Rules/Calculators/A&P HEAR Score: 3                        Missey Gammon was evaluated in Emergency Department on 10/23/2020 for the symptoms described in the history of present illness. She was evaluated in the context of the global COVID-19 pandemic, which necessitated consideration that the patient might be at risk for infection with the SARS-CoV-2 virus that causes COVID-19. Institutional protocols and algorithms that pertain to the evaluation of patients at risk for COVID-19 are in a state of rapid change based on information released by regulatory bodies including the CDC and federal and state organizations. These policies and algorithms were followed during the patient's care in the ED.  I personally reviewed patient's medical chart and all notes from triage and staff during today's encounter. I have also ordered and reviewed all labs and imaging that I felt to be medically necessary in the evaluation of this patient's complaints and with consideration of their physical exam. If  needed, translation services were available and utilized.   Patient's exertional CP/SOB concerning for angina, but evidently she has had this for months and she has  already had cardiac stress test with her cardiologist at [facility].  Troponin trended briefly up but came right back down with third troponin.  Discussed case with Dr. Regenia Skeeter, reasonable for outpatient follow-up.  She is hoping to be discharged as she feels entirely improved.  Comprehensive work-up obtained to assess for cause of symptoms.  EKG with sinus rhythm, no significant changes when compared to yesterday. Troponin is negative, lowering concern for NSTEMI.  Patient has a low Heart Score which lowers suspicion for ACS.  I have low suspicion for dissection given normal pulses in extremities and normal mediastinum on CXR.  I reviewed DG Chest and there is no evidence of pneumothorax or consolidation concerning for pneumonia.  There is also no pleural effusion on x-ray or history suggestive of possible esophageal rupture.  Patient is PERC negative, low risk for PE per [place] Criteria.  I discussed the patient the exact etiology of their chest pain is unclear and warrants follow up with a primary care provider. Also discussed that while their risk for ACS is low, it is not completely eliminated and I discussed with patient specific warning signs and return precautions.   Final Clinical Impression(s) / ED Diagnoses Final diagnoses:  Nonspecific chest pain    Rx / DC Orders ED Discharge Orders     None        [name], PA-C 10/23/20 1652    Sherwood Gambler, MD 10/26/20 1521

## 2020-10-23 NOTE — ED Provider Notes (Signed)
Emergency Medicine Provider Triage Evaluation Note  Connie Hoffman , a 60 y.o. female  was evaluated in triage.  Pt complains of exertional chest pain with associated shortness of breath and lightheadedness.  She endorses family history of cardiac disease, but nothing premature.  No personal history of exertional syncope.  Evidently her blood pressures and lipids are well controlled without medical management.  No leg swelling.  No history of clots or clotting disorder.  No cough.  Review of Systems  Positive: CP, SOB Negative: Cough, leg swelling, hx clots.  Physical Exam  BP 128/82 (BP Location: Right Arm)   Pulse 72   Temp 97.9 F (36.6 C)   Resp 18   SpO2 100%  Gen:   Awake, no distress   Resp:  Normal effort  MSK:   Moves extremities without difficulty  Other:  No lower extremity edema  Medical Decision Making  Medically screening exam initiated at 9:53 AM.  Appropriate orders placed.  Connie Hoffman was informed that the remainder of the evaluation will be completed by another provider, this initial triage assessment does not replace that evaluation, and the importance of remaining in the ED until their evaluation is complete.  She has an appointment scheduled with [facility], Dr. Brigitte Pulse, on Saturday.    CXR was obtained yesterday.   Connie Herter, PA-C 10/23/20 [place], MD 10/23/20 1010

## 2020-10-23 NOTE — ED Triage Notes (Signed)
Pt reports mid-sternum chest pain since Sunday night, radiates to her Right side. Pt here yesterday for the same, she left without being seen by a provider

## 2020-11-05 ENCOUNTER — Ambulatory Visit: Payer: Medicare Other | Attending: Obstetrics and Gynecology | Admitting: Physical Therapy

## 2020-11-16 ENCOUNTER — Other Ambulatory Visit: Payer: Self-pay | Admitting: Physician Assistant

## 2020-11-18 NOTE — Telephone Encounter (Signed)
Next Visit: 01/15/2021  Last Visit: 10/08/2020  Last Fill: 07/18/2020  [id number] arthritis  Current Dose per office note 10/08/2020: Cosentyx 150 mg sq injections once every 14 days  Labs: 10/23/2020 Albumin 3.4, Platelets 405  TB Gold: 05/01/2020 Neg    Okay to refill Cosentyx?

## 2020-11-23 ENCOUNTER — Other Ambulatory Visit: Payer: Self-pay | Admitting: Physician Assistant

## 2020-11-25 NOTE — Telephone Encounter (Signed)
Next Visit: 01/15/2021  Last Visit: 10/08/2020  Last Fill: 08/27/2020  DX: Psoriatic arthritis  Current Dose per office note on 10/08/2020: methotrexate 0.7 ml sq injections every 7 days  Labs: 10/23/2020 albumin 3.4, platelets 405  Okay to refill methotrexate?

## 2020-12-19 ENCOUNTER — Ambulatory Visit (INDEPENDENT_AMBULATORY_CARE_PROVIDER_SITE_OTHER): Payer: Medicare Other | Admitting: Physician Assistant

## 2020-12-19 ENCOUNTER — Other Ambulatory Visit: Payer: Self-pay

## 2020-12-19 ENCOUNTER — Encounter: Payer: Self-pay | Admitting: Physician Assistant

## 2020-12-19 VITALS — BP 134/77 | HR 76 | Ht 65.0 in | Wt 277.0 lb

## 2020-12-19 DIAGNOSIS — M1711 Unilateral primary osteoarthritis, right knee: Secondary | ICD-10-CM

## 2020-12-19 DIAGNOSIS — M19041 Primary osteoarthritis, right hand: Secondary | ICD-10-CM

## 2020-12-19 DIAGNOSIS — D75839 Thrombocytosis, unspecified: Secondary | ICD-10-CM

## 2020-12-19 DIAGNOSIS — M5136 Other intervertebral disc degeneration, lumbar region: Secondary | ICD-10-CM

## 2020-12-19 DIAGNOSIS — Z96652 Presence of left artificial knee joint: Secondary | ICD-10-CM

## 2020-12-19 DIAGNOSIS — Z9884 Bariatric surgery status: Secondary | ICD-10-CM

## 2020-12-19 DIAGNOSIS — L409 Psoriasis, unspecified: Secondary | ICD-10-CM | POA: Diagnosis not present

## 2020-12-19 DIAGNOSIS — G5623 Lesion of ulnar nerve, bilateral upper limbs: Secondary | ICD-10-CM

## 2020-12-19 DIAGNOSIS — M19072 Primary osteoarthritis, left ankle and foot: Secondary | ICD-10-CM

## 2020-12-19 DIAGNOSIS — M19042 Primary osteoarthritis, left hand: Secondary | ICD-10-CM

## 2020-12-19 DIAGNOSIS — R252 Cramp and spasm: Secondary | ICD-10-CM

## 2020-12-19 DIAGNOSIS — R296 Repeated falls: Secondary | ICD-10-CM

## 2020-12-19 DIAGNOSIS — Z79899 Other long term (current) drug therapy: Secondary | ICD-10-CM | POA: Diagnosis not present

## 2020-12-19 DIAGNOSIS — M19071 Primary osteoarthritis, right ankle and foot: Secondary | ICD-10-CM

## 2020-12-19 DIAGNOSIS — L4050 Arthropathic psoriasis, unspecified: Secondary | ICD-10-CM | POA: Diagnosis not present

## 2020-12-19 MED ORDER — PREDNISONE 5 MG PO TABS: ORAL_TABLET | ORAL | 0 refills | Status: DC

## 2020-12-19 NOTE — Progress Notes (Signed)
Office Visit Note  Patient: Connie Hoffman             Date of Birth: 02/06/1961           MRN: [id number]             PCP: Jenel Lucks, PA-C Referring: Burna Cash* Visit Date: 12/19/2020 Occupation: '@GUAROCC'$ @  Subjective:  Pain in multiple joints   History of Present Illness: Connie Hoffman is a 60 y.o. female with history of psoriatic arthritis and osteoarthritis.  She is on Cosentyx 150 mg sq injections every 14 days, methotrexate 0.7 mL sq injections once weekly, and folic acid 2 mg by mouth daily.  She has not missed any doses of these medications recently.  Her most recent dose of Cosentyx was on Tuesday and most recent dose of methotrexate was yesterday.  Patient reports that on Monday she started to have increased pain and stiffness in both hands and both wrist joints.  Her pain has progressively been worsening and she has noticed inflammation especially in the left wrist and both hands.  She is having difficulty performing ADLs due to the severity of pain.  She has not tried taking any over-the-counter products due to the concern for elevated LFTs.  She tried taking methocarbamol yesterday which provided minimal relief.  She has been having difficulty sleeping due to nocturnal pain.  She continues her persistent pain in both knee joints.  According to the patient she has an upcoming nerve ablation to try to improve her right knee pain.     Activities of Daily Living:  Patient reports morning stiffness for 45 minutes.   Patient Reports nocturnal pain.  Difficulty dressing/grooming: Reports Difficulty climbing stairs: Reports Difficulty getting out of chair: Reports Difficulty using hands for taps, buttons, cutlery, and/or writing: Reports  Review of Systems  Constitutional:  Positive for fatigue.  HENT:  Positive for mouth dryness. Negative for mouth sores and nose dryness.   Eyes:  Positive for redness, itching and dryness.  Respiratory:   Negative for shortness of breath and difficulty breathing.   Cardiovascular:  Positive for chest pain. Negative for palpitations.       Followed by cardiology   Gastrointestinal:  Negative for blood in stool, constipation and diarrhea.  Endocrine: Negative for increased urination.  Genitourinary:  Negative for difficulty urinating.  Musculoskeletal:  Positive for joint pain, joint pain, joint swelling, myalgias, morning stiffness, muscle tenderness and myalgias.  Skin:  Positive for rash. Negative for color change.  Allergic/Immunologic: Positive for susceptible to infections.  Neurological:  Positive for numbness and headaches. Negative for dizziness.  Hematological:  Positive for bruising/bleeding tendency.  Psychiatric/Behavioral:  Negative for confusion.    PMFS History:  Patient Active Problem List   Diagnosis Date Noted   Midline cystocele 05/23/2020   Pelvic mass 12/04/2019   DDD (degenerative disc disease), lumbar 01/21/2018   Primary osteoarthritis of both hands 01/21/2018   Primary osteoarthritis of both feet 01/21/2018   Psoriasis 12/01/2017   Psoriatic arthritis ([place]) 12/01/2017   High risk medication use 12/01/2017   S/P total knee replacement, 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   Thrombocytosis 12/01/2017   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    Past Medical History:  Diagnosis Date   DDD (degenerative disc disease), lumbar    Psoriatic arthritis ([place])    Spinal stenosis     Family History  Problem Relation Age of Onset   Diabetes Mother    Hypertension Mother    Heart Problems Mother  Congestive Heart Failure Mother    Diabetes Father    Hypertension Father    Stroke Sister    Heart Problems Sister    Prostate cancer Brother    Diabetes Brother    Asthma Daughter    Healthy Daughter    Healthy Son    Healthy Son    Past Surgical History:  Procedure Laterality Date   BACK SURGERY  05/18/2019   L4 and L5   CHOLECYSTECTOMY     CYSTECTOMY  07/2017   JOINT  REPLACEMENT Left    left knee   LAPAROSCOPIC ENDOMETRIOSIS FULGURATION  2011   LAPAROSCOPIC GASTRIC SLEEVE RESECTION  2018   LAPAROSCOPIC OOPHERECTOMY Right    LUMBAR EPIDURAL INJECTION  04/13/2018   REVISION TOTAL KNEE ARTHROPLASTY Left 12/2017   TOTAL SHOULDER ARTHROPLASTY Bilateral    TUBAL LIGATION     postpartum   Social History   Social History Narrative   Right handed    Lives with husband and 1 daughters    Immunization History  Administered Date(s) Administered   Influenza,inj,Quad PF,6+ Mos 04/15/2018   PFIZER(Purple Top)SARS-COV-2 Vaccination 07/15/2019, 08/05/2019, 03/13/2020   Pneumococcal Polysaccharide-23 04/15/2018   Zoster Recombinat (Shingrix) 05/13/2018, 10/25/2019     Objective: Vital Signs: BP 134/77 (BP Location: Left Wrist, Patient Position: Sitting, Cuff Size: Normal)   Pulse 76   Ht '5\' 5"'$  (1.651 m)   Wt 277 lb (125.6 kg)   BMI 46.10 kg/m    Physical Exam Vitals and nursing note reviewed.  Constitutional:      Appearance: She is well-developed.  HENT:     Head: Normocephalic and atraumatic.  Eyes:     Conjunctiva/sclera: Conjunctivae normal.  Pulmonary:     Effort: Pulmonary effort is normal.  Abdominal:     Palpations: Abdomen is soft.  Musculoskeletal:     Cervical back: Normal range of motion.  Skin:    General: Skin is warm and dry.     Capillary Refill: Capillary refill takes less than 2 seconds.  Neurological:     Mental Status: She is alert and oriented to person, place, and time.  Psychiatric:        Behavior: Behavior normal.     Musculoskeletal Exam: C-spine has slightly limited range of motion without rotation.  Trapezius muscle tension and tenderness bilaterally.  Thoracic and lumbar spine have good range of motion.  No midline spinal tenderness or SI joint tenderness.  Shoulder joints have slightly limited abduction to about 120 degrees bilaterally.  Elbow joints have good range of motion with no tenderness or inflammation.   Tenderness over both wrist joints, left greater than right.  Mild synovitis of the left wrist noted.  Tenderness of all MCPs and the right hand.  Difficulty making a complete fist due to severity of pain.  No contractures noted.  Hip joints have good range of motion with no discomfort.  Painful range of motion of both knees.  Warmth of the left knee replacement noted.  Ankle joints have good range of motion with no tenderness or joint swelling.  No evidence of Achilles tendinitis or plantar fasciitis.  No tenderness over MTP joints.  CDAI Exam: CDAI Score: 1.2  Patient Global: 7 mm; Provider Global: 5 mm Swollen: 0 ; Tender: 0  Joint Exam 12/19/2020   No joint exam has been documented for this visit   There is currently no information documented on the homunculus. Go to the Rheumatology activity and complete the homunculus joint exam.  Investigation:  No additional findings.  Imaging: No results found.  Recent Labs: Lab Results  Component Value Date   WBC 7.6 10/23/2020   HGB 12.2 10/23/2020   PLT 405 (H) 10/23/2020   NA 141 10/23/2020   K 4.1 10/23/2020   CL 106 10/23/2020   CO2 28 10/23/2020   GLUCOSE 96 10/23/2020   BUN 10 10/23/2020   CREATININE 0.72 10/23/2020   BILITOT 0.6 10/23/2020   ALKPHOS 99 10/23/2020   AST 21 10/23/2020   ALT 29 10/23/2020   PROT 6.9 10/23/2020   ALBUMIN 3.4 (L) 10/23/2020   CALCIUM 9.1 10/23/2020   GFRAA 103 10/08/2020   QFTBGOLDPLUS NEGATIVE 05/01/2020    Speciality Comments: Cosentyx start date:04/2018  Procedures:  No procedures performed Allergies: Aspirin and Tylenol [acetaminophen]   Assessment / Plan:     Visit Diagnoses: Psoriatic arthritis ([place]): She presents today with severe pain in both hands and both wrist joints.  She has very mild synovitis and significant tenderness over the left wrist.  Tenderness of all MCPs of the right hand but no synovitis or dactylitis was noted.  She started to have a flare on Monday and has been  unable to identify a trigger.  She has not been performing any overuse activities and has not been under increased rest recently.  She has not missed any doses of Cosentyx or methotrexate.  Overall she has been doing well on Cosentyx 150 mg sq injections every 14 days and methotrexate 0.7 ml sq injections once weekly.  She has no evidence of Achilles tendinitis or plantar fasciitis on examination today.  No SI joint tenderness was noted.  She has no active psoriasis.  I discussed with the patient that she would likely benefit from increasing the dose of Cymbalta from 30 mg to 60 mg daily so she plans on further discussing with her PCP at her upcoming appointment on 12/31/2020.  For the acute flare a prednisone taper starting at 20 mg tapering by 5 mg every 2 days was sent to the pharmacy.  She was advised to monitor her blood glucose closely while taking the short prednisone taper.  Hemoglobin A1c was 5.6 on 12/17/2020.  Her PCP has been notified that she will be starting on a prednisone taper.  Potential side effects were discussed with the patient today.  She will remain on Cosentyx and methotrexate as prescribed.  She was advised to notify us if she continues to have recurrent flares.  She will keep her appointment with Dr. Estanislado Pandy on 01/15/2021.  Psoriasis: She has no active psoriasis at this time.  High risk medication use - Cosentyx 150 mg sq injections once every 14 days, methotrexate 0.7 ml sq injections every 7 days, and folic acid 1 mg daily.CBC and CMP updated on 12/17/20.  She will be due to update lab work in November and every 3 months to monitor for drug toxicity.  Standing orders for CBC and CMP remain in place.  TB Gold negative on 05/01/2020 and will continue to be monitored yearly. She has not had any recent infections.  Discussed the importance of holding methotrexate and Cosentyx if she develops signs or symptoms of an infection and to resume once infection is completely  cleared.  Thrombocytosis: Platelet count within normal limits-362 on 12/17/2020.  Primary osteoarthritis of both hands: She presents today with increased pain and stiffness in both hands.  Resting importance of joint protection and muscle strengthening.  She has noticed some decreased grip strength so she was strongly  encouraged to start to perform hand exercises on a daily basis once her flare has resolved.  Ulnar neuropathy of both upper extremities - She was evaluated by Dr. Posey Pronto on 03/28/2020.  Findings were consistent with bilateral ulnar neuropathy.  S/P total knee replacement, left: Chronic pain.  She has slightly limited extension with warmth but no effusion.    Primary osteoarthritis of right knee: Chronic pain.  She has difficulty ambulating due to the severity of pain. Scheduled for a nerve ablation soon.   Primary osteoarthritis of both feet: She is not experiencing any increased discomfort in her feet at this time.   DDD (degenerative disc disease), lumbar: She has no midline spinal tenderness. No symptoms of radiculopathy.   Other medical conditions are listed as follows:   History of gastric bypass  Nocturnal muscle cramps  Frequent falls - Underwent workup by Dr. Posey Pronto.  Orders: No orders of the defined types were placed in this encounter.  Meds ordered this encounter  Medications   predniSONE (DELTASONE) 5 MG tablet    Sig: Take 4 tablets by mouth daily x2 days, 3 tablets daily x2 days, 2 tablets by mouth daily x2 days, 1 tablet by mouth daily x2 days.    Dispense:  20 tablet    Refill:  0       Follow-Up Instructions: Return in about 5 months (around 05/21/2021) for Psoriatic arthritis, Osteoarthritis.   Ofilia Neas, PA-C  Note - This record has been created using Dragon software.  Chart creation errors have been sought, but may not always  have been located. Such creation errors do not reflect on  the standard of medical care.

## 2020-12-19 NOTE — Progress Notes (Deleted)
[facility] Return Visit  SUBJECTIVE  History of Present Illness: Connie Hoffman is a 60 y.o. female seen in follow-up for levator spasm. Plan at last visit was to start physical therapy. She did not attend.     Past Medical History: Patient  has a past medical history of DDD (degenerative disc disease), lumbar, Psoriatic arthritis ([place]), and Spinal stenosis.   Past Surgical History: She  has a past surgical history that includes Joint replacement (Left); Total shoulder arthroplasty (Bilateral); Cholecystectomy; Laparoscopic gastric sleeve resection (2018); Cystectomy (07/2017); Revision total knee arthroplasty (Left, 12/2017); Lumbar epidural injection (04/13/2018); Back surgery (05/18/2019); Laparoscopic endometriosis fulguration (2011); Laparoscopic oopherectomy (Right); and Tubal ligation.   Medications: She has a current medication list which includes the following prescription(s): duloxetine, folic acid, gabapentin, gabapentin, ketoconazole, biofreeze, methocarbamol, methotrexate, methotrexate, multivitamin with minerals, ozempic (0.25 or 0.5 mg/dose), cosentyx sensoready (300 mg), triamcinolone ointment, and tuberculin syr 1cc/27gx1/2".   Allergies: Patient is allergic to aspirin and tylenol [acetaminophen].   Social History: Patient  reports that she has never smoked. She has never used smokeless tobacco. She reports that she does not drink alcohol and does not use drugs.      OBJECTIVE     Physical Exam: There were no vitals filed for this visit. Gen: No apparent distress, A&O x 3.  Detailed Urogynecologic Evaluation:  Deferred. Prior exam showed:  No flowsheet data found.     ASSESSMENT AND PLAN    Connie Hoffman is a 60 y.o. with:  No diagnosis found.

## 2020-12-19 NOTE — Patient Instructions (Signed)

## 2020-12-20 ENCOUNTER — Ambulatory Visit: Payer: Medicare Other | Admitting: Obstetrics and Gynecology

## 2021-01-01 ENCOUNTER — Telehealth: Payer: Self-pay

## 2021-01-01 NOTE — Telephone Encounter (Signed)
Patient left a voicemail to let Connie Hoffman know that the Prednisone really helped with her inflammation and she also has more energy.

## 2021-01-01 NOTE — Telephone Encounter (Signed)
I'm glad to hear the patients symptoms have improved.  Please advise the patient to notify us if her symptoms return.

## 2021-01-02 NOTE — Telephone Encounter (Signed)
Patient advised I'm glad to hear the patients symptoms have improved.  Patient advised to notify us if her symptoms return.

## 2021-01-10 ENCOUNTER — Telehealth: Payer: Self-pay | Admitting: Rheumatology

## 2021-01-10 NOTE — Telephone Encounter (Signed)
Please have patient discontinue MTX.

## 2021-01-10 NOTE — Telephone Encounter (Signed)
Patient advised to discontinue MTX.

## 2021-01-10 NOTE — Telephone Encounter (Signed)
FYI: Patient calling to let you know her Liver Enzyme level is still continuing to increase. Patient's PCP is sending her for [place] of Abdomen.

## 2021-01-15 ENCOUNTER — Ambulatory Visit: Payer: Medicare Other | Admitting: Rheumatology

## 2021-01-20 ENCOUNTER — Encounter: Payer: Self-pay | Admitting: Neurology

## 2021-01-20 ENCOUNTER — Other Ambulatory Visit: Payer: Self-pay

## 2021-01-20 ENCOUNTER — Ambulatory Visit (INDEPENDENT_AMBULATORY_CARE_PROVIDER_SITE_OTHER): Payer: Medicare Other | Admitting: Neurology

## 2021-01-20 VITALS — BP 139/82 | HR 77 | Ht 65.0 in | Wt 270.0 lb

## 2021-01-20 DIAGNOSIS — R519 Headache, unspecified: Secondary | ICD-10-CM | POA: Diagnosis not present

## 2021-01-20 DIAGNOSIS — G5623 Lesion of ulnar nerve, bilateral upper limbs: Secondary | ICD-10-CM | POA: Diagnosis not present

## 2021-01-20 MED ORDER — GABAPENTIN 300 MG PO CAPS: 300.0000 mg | ORAL_CAPSULE | Freq: Three times a day (TID) | ORAL | 1 refills | Status: DC

## 2021-01-20 NOTE — Patient Instructions (Addendum)
We will refer you to see Dr. Francesco Runner for your surgical management options for ulnar neuropathy.   MRI brain without contrast will be ordered  Return to clinic in 4 months

## 2021-01-20 NOTE — Progress Notes (Signed)
Follow-up Visit   Date: 01/20/21   Connie Hoffman MRN: [id number] DOB: 04-13-1961   Interim History: Connie Hoffman is a 60 y.o. 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  The patient was accompanied to the clinic by self.  History of present illness: Starting early 2021, she began noticing numbness in both hands, with occasional tingling. Symptoms are noticeable when she wakes up or when she is resting.  She endorses weakness and difficulty opening jars/bottle.     She has tingling and cramping of the legs for the past 1-2 years and takes gabapentin 100mg  twice daily. She had tingling up to her knees, which is present at nighttime.  She was diagnosed with neuropathy in 2018 by [facility] while living in [place].  This was ordered by her pain management provider, who she was seeing for lumbar canal stenosis. She underwent lumbar surgery which has significantly helped her low back pain and tingling in her legs.  She continues to get muscle cramps at night time. She takes methocarbamol and tramadol for pain.     She takes methotrexate and Cosentyx for psoriatic arthritis.   UPDATE 06/26/2020:  She is here for follow-up and reports worsening numbness/tingling of the hands, despite using soft elbow pad and being careful about how she positions the arms.  She wakes up most morning with her hands falling asleep and finds herself often shaking them throughout the day to get relief.  Symptoms are not constant, but recur.  She also some weakness in the hands, especially with opening jars/bottles.    UPDATE 09/19/2020:  Over the past 6 months, she has developed piercing headaches, lasting any where from 10-45 minutes, which occurs daily. It is usually at the base of her head and behind the right ear.   It is worse with movement and in the evening.  She prefers quiet,  dark room.  She has some relief with ice application to her neck.  She endorses photophobia, phonophobia, and mild dizziness.  She does not treat the headaches.  Headache does not wake her up from sleeping.  No worsening with coughing/sneezing.  No prior history of headaches.   The numbness and tingling in the hands is unchanged.  She has not started occupational therapy.   UPDATE 01/20/2021:  She is here for follow-up.  She reports worsening bilateral hand numbness and notices that within 5 minutes of holding her phone, her hand will falls asleep.  She is having harder time trying to get comfortable at night time, also.    Her headaches continue to be bothersome, it occurs about 4 days per week and lasts 3-4 hours.  She has associated nausea, photophobia.  She sometimes feels like she is in a fog when she wakes up.  She started gabapentin 300mg  at bedtime, but has not noticed any improvement.  Medications:  Current Outpatient Medications on File Prior to Visit  Medication Sig Dispense Refill   DULoxetine (CYMBALTA) 30 MG capsule Take 30 mg by mouth at bedtime.     folic acid (FOLVITE) 1 MG tablet Take 1-2 mg by mouth daily.     gabapentin (NEURONTIN) 100 MG capsule Take 100 mg by mouth at bedtime.     gabapentin (NEURONTIN) 300 MG capsule Take 1 capsule (300 mg total) by mouth at bedtime. (Patient taking differently: Take 300 mg by mouth in the morning and at  bedtime.) 90 capsule 1   ketoconazole (NIZORAL) 2 % cream Apply 1 application topically daily. (Patient taking differently: Apply 1 application topically daily as needed for irritation.) 15 g 0   Menthol, Topical Analgesic, (BIOFREEZE) 4 % GEL Apply 1 application topically See admin instructions. Apply to painful sites daily as directed     methocarbamol (ROBAXIN) 500 MG tablet Take 500-1,000 mg by mouth 2 (two) times daily as needed for muscle spasms.     Multiple Vitamin (MULTIVITAMIN WITH MINERALS) TABS tablet Take 1 tablet by mouth daily.      OZEMPIC, 0.25 OR 0.5 MG/DOSE, 2 MG/1.5ML SOPN Inject 2 mg into the skin every Saturday.     predniSONE (DELTASONE) 5 MG tablet Take 4 tablets by mouth daily x2 days, 3 tablets daily x2 days, 2 tablets by mouth daily x2 days, 1 tablet by mouth daily x2 days. 20 tablet 0   Secukinumab, 300 MG Dose, (COSENTYX SENSOREADY, 300 MG,) 150 MG/ML SOAJ Inject 150 mg into the skin every 14 (fourteen) days. 6 mL 0   triamcinolone ointment (KENALOG) 0.1 % Apply 1 application topically 2 (two) times daily as needed (to affected sites- for itching).  0   TUBERCULIN SYR 1CC/27GX1/2" 27G X 1/2" 1 ML MISC Use 1 syringe once weekly to inject methotrexate. 12 each 2   Methotrexate 25 MG/ML SOSY Inject 17.5 mg into the skin every Wednesday. (Patient not taking: No sig reported)     methotrexate 50 MG/2ML injection INJECT 0.7ML INTO THE SKIN ONCE A WEEK (Patient not taking: Reported on 01/20/2021) 10 mL 0   No current facility-administered medications on file prior to visit.    Allergies:  Allergies  Allergen Reactions   Aspirin Other (See Comments)    GI bleeding   Tylenol [Acetaminophen] Other (See Comments)    Raised patient's liver enzymes    Vital Signs:  BP 139/82   Pulse 77   Ht 5\' 5"  (1.651 m)   Wt 270 lb (122.5 kg)   SpO2 100%   BMI 44.93 kg/m    Neurological Exam: MENTAL STATUS including orientation to time, place, person, recent and remote memory, attention span and concentration, language, and fund of knowledge is normal.  Speech is not dysarthric.  CRANIAL NERVES:  Normal conjugate, extra-ocular eye movements in all directions of gaze.  No ptosis. Face is symmetric. Tongue is midline.   MOTOR:  Motor strength is 5/5 in all extremities, except bilateral finger abductors are 4/5.  No atrophy, fasciculations or abnormal movements.  No pronator drift.  Tone is normal.    MSRs:  Reflexes are 2+/4 throughout.  SENSORY:  Intact to vibration and temperature throughout.  COORDINATION/GAIT:   Gait is mildly wide-based due to body habitus, stable unassisted   Data: [facility]/EMG of the arms 05/08/2020: Bilateral ulnar neuropathy with slowing across the elbow, predominantly demyelinating, mild-to-moderate and worse on the right. There is no evidence of a cervical radiculopathy or carpal tunnel syndrome affecting either upper extremity  IMPRESSION/PLAN: 1.  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  - She would like to know more about surgical options.  She is established with neurosurgery so we will see if they can assist with her ulnar nerve.  2.  Atypical migraine  - Increase gabapentin 300mg  three times daily - MRI brain without contrast given new onset headaches, worsening  Return to clinic in 4 months  Thank you for  allowing me to participate in patient's care.  If I can answer any additional questions, I would be pleased to do so.    Sincerely,    Mickey Esguerra K. Allena Katz, DO

## 2021-01-28 ENCOUNTER — Other Ambulatory Visit: Payer: Self-pay | Admitting: Physician Assistant

## 2021-01-28 NOTE — Telephone Encounter (Signed)
Next Visit: 05/21/2021  Last Visit: 12/19/2020  Last Fill: 11/18/2020  [id number] arthritis   Current Dose per office note 12/19/2020: Cosentyx 150 mg sq injections once every 14 days  Labs: 12/17/2020 MCHC 32.1, Glucose 139, ALT 67  TB Gold: 05/01/2020 Neg    Okay to refill Cosentyx?

## 2021-01-29 ENCOUNTER — Other Ambulatory Visit: Payer: Self-pay

## 2021-01-29 ENCOUNTER — Encounter: Payer: Self-pay | Admitting: Obstetrics and Gynecology

## 2021-01-29 ENCOUNTER — Ambulatory Visit (INDEPENDENT_AMBULATORY_CARE_PROVIDER_SITE_OTHER): Payer: Medicare Other | Admitting: Obstetrics and Gynecology

## 2021-01-29 VITALS — BP 165/80 | HR 73

## 2021-01-29 DIAGNOSIS — M62838 Other muscle spasm: Secondary | ICD-10-CM | POA: Diagnosis not present

## 2021-01-29 DIAGNOSIS — N8110 Cystocele, unspecified: Secondary | ICD-10-CM | POA: Diagnosis not present

## 2021-01-29 NOTE — Progress Notes (Signed)
[facility] Return Visit  SUBJECTIVE  History of Present Illness: Connie Hoffman is a 60 y.o. female seen in follow-up for pelvic pain. Plan at last visit was to start physical therapy but she could not go because she was getting PT for her hands. Her pain improved but started up again in the last two weeks. Having pain on the right radiating to her groin.   Having some urinary frequency but not bothersome. Felt her prolapse the other day but usually does not feel it.   Past Medical History: Patient  has a past medical history of DDD (degenerative disc disease), lumbar, Psoriatic arthritis ([place]), and Spinal stenosis.   Past Surgical History: She  has a past surgical history that includes Joint replacement (Left); Total shoulder arthroplasty (Bilateral); Cholecystectomy; Laparoscopic gastric sleeve resection (2018); Cystectomy (07/2017); Revision total knee arthroplasty (Left, 12/2017); Lumbar epidural injection (04/13/2018); Back surgery (05/18/2019); Laparoscopic endometriosis fulguration (2011); Laparoscopic oopherectomy (Right); and Tubal ligation.   Medications: She has a current medication list which includes the following prescription(s): cosentyx sensoready (300 mg), duloxetine, folic acid, gabapentin, biofreeze, methocarbamol, methotrexate, methotrexate, multivitamin with minerals, ozempic (0.25 or 0.5 mg/dose), prednisone, triamcinolone ointment, tuberculin syr 1cc/27gx1/2", and ketoconazole.   Allergies: Patient is allergic to aspirin and tylenol [acetaminophen].   Social History: Patient  reports that she has never smoked. She has never used smokeless tobacco. She reports that she does not drink alcohol and does not use drugs.      OBJECTIVE     Physical Exam: Vitals:   01/29/21 1149  BP: (!) 165/80  Pulse: 73   Gen: No apparent distress, A&O x 3.  Detailed Urogynecologic Evaluation:  Deferred. Prior exam showed: POP-Q:    POP-Q   0                                             Aa   0                                           Ba   -4.5                                              C    4                                            Gh   4                                            Pb   7                                            tvl    -2  Ap   -2                                            Bp   -6                                              D        ASSESSMENT AND PLAN    Connie Hoffman is a 60 y.o. with:  1. Levator spasm   2. Prolapse of anterior vaginal wall    Levator spasm - Has methocarbamol at home which she takes as needed. Advised to take twice a day for a week and then as needed after to help reduce pain.  - She is also interested in attending physical therapy- referral reordered.   POP - for prolapse we discussed that if it is not bothersome, then further treatment is not needed at this time.  - Pelvic floor exercises from PT can help prevent progression.    Return 2 months for follow up  Jaquita Folds, MD  Time spent: I spent 20 minutes dedicated to the care of this patient on the date of this encounter to include pre-visit review of records, face-to-face time with the patient and post visit documentation.

## 2021-01-29 NOTE — Patient Instructions (Signed)
Take methocarbamol twice a day weekly then as needed for pain.  Referral was placed for pelvic floor physical therapy.

## 2021-02-12 ENCOUNTER — Telehealth: Payer: Self-pay | Admitting: Neurology

## 2021-02-12 DIAGNOSIS — G5623 Lesion of ulnar nerve, bilateral upper limbs: Secondary | ICD-10-CM

## 2021-02-12 NOTE — Telephone Encounter (Signed)
Pt called to see about the therapy she was supposed to start, she has not heard anything. And she stated patel wanted her to have a surgery, but the doc he referred doesn't do it, it will have to be dr.michelle kirkpatrick

## 2021-02-13 NOTE — Telephone Encounter (Signed)
I don't see any notes on PT, but we can certainly refer her to occupational therapy for ulnar neuropathy.  OK to send referrs to Dr. Randall Hiss for surgical evaluation for bilateral ulnar neuropathy. Thanks.

## 2021-02-13 NOTE — Telephone Encounter (Signed)
Called and spoke to patient and informed her that we will send a referral for occupational therapy and a referral to Dr. Randall Hiss. Patient thanked me for the call and had no further questions or concerns.

## 2021-02-19 ENCOUNTER — Other Ambulatory Visit: Payer: Self-pay

## 2021-02-19 ENCOUNTER — Encounter: Payer: Self-pay | Admitting: Occupational Therapy

## 2021-02-19 ENCOUNTER — Ambulatory Visit: Payer: Medicare Other | Attending: Neurology | Admitting: Occupational Therapy

## 2021-02-19 DIAGNOSIS — M79641 Pain in right hand: Secondary | ICD-10-CM | POA: Diagnosis not present

## 2021-02-19 DIAGNOSIS — M79642 Pain in left hand: Secondary | ICD-10-CM | POA: Insufficient documentation

## 2021-02-19 DIAGNOSIS — R202 Paresthesia of skin: Secondary | ICD-10-CM | POA: Insufficient documentation

## 2021-02-19 DIAGNOSIS — M6281 Muscle weakness (generalized): Secondary | ICD-10-CM | POA: Diagnosis present

## 2021-02-19 DIAGNOSIS — R278 Other lack of coordination: Secondary | ICD-10-CM | POA: Insufficient documentation

## 2021-02-19 DIAGNOSIS — R208 Other disturbances of skin sensation: Secondary | ICD-10-CM | POA: Diagnosis present

## 2021-02-19 NOTE — Patient Instructions (Signed)
Avoid activities that require you to keep your arm bent for long periods of time (e.g., holding a phone to our ear) While working at a desk and/or on the computer, be sure your elbows are not bent more than 90 (e.g., not fully bent) or resting on a hard surface such as the armrest of a chair Avoid leaning on your elbow or puttying pressure on the inside of your arm (e.g., driving with you arm resting on the open window or the middle console between the seats) Keep your elbow straight at night when you are sleeping. Avoid sleeping in the "fetal position," with your elbows fully bent. Keep the elbows relatively straight (bent less than 90) This can be accomplished by wedging a pillow into your elbow or wearing an elbow pad to block elbow motion. You may also wrap your elbow with a thick towel, secured with tape to prevent full elbow flexion (bending)

## 2021-02-20 ENCOUNTER — Ambulatory Visit
Admission: RE | Admit: 2021-02-20 | Discharge: 2021-02-20 | Disposition: A | Discharge status: Home / Self Care | Payer: Medicare Other | Source: Ambulatory Visit | Attending: Neurology | Admitting: Neurology

## 2021-02-20 DIAGNOSIS — G5623 Lesion of ulnar nerve, bilateral upper limbs: Secondary | ICD-10-CM

## 2021-02-20 DIAGNOSIS — R519 Headache, unspecified: Secondary | ICD-10-CM

## 2021-02-20 IMAGING — MR MR HEAD W/O CM
11 series · 48 of 48 positions shown · non-contrast
Comparison: Head CT [DATE]. MRI of the cervical spine
[DATE].

CLINICAL DATA: Ulnar neuropathy of both upper extremities. New
onset of headaches after age 52. Headache, chronic, new features or
increased frequency. Additional history provided by scanning
technologist: Patient reports severe headaches for 1 year.

EXAM:
MRI HEAD WITHOUT CONTRAST
TECHNIQUE: Multiplanar, multiecho pulse sequences of the brain and surrounding
structures were obtained without intravenous contrast.

[Series 5: T1 · sagittal · 4.0mm · 0.75mm/px · 2 of 31 slices shown (1 of 2)]
[im 1/31]
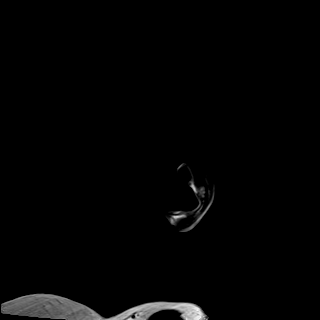
[im 31/31]
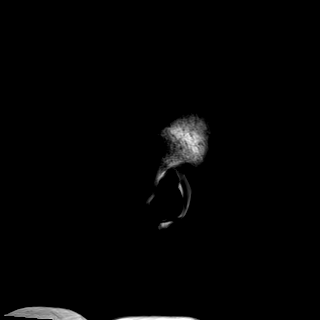

[Series 6: DWI · axial · 3.0mm · 0.94mm/px · z∈[-133,+1]mm · 10 of 160 slices shown (1 of 3)]
[im 1/160]
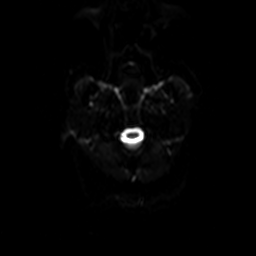
[im 18/160]
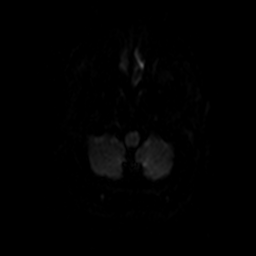
[im 36/160]
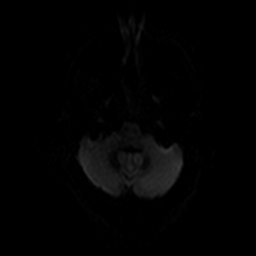
[im 54/160]
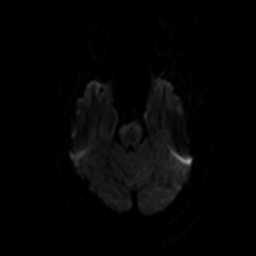
[im 71/160]
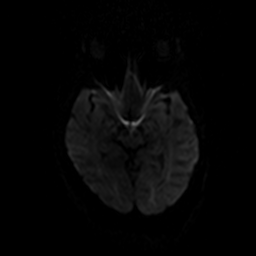
[im 89/160]
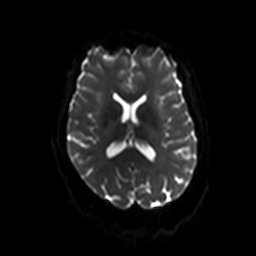
[im 107/160]
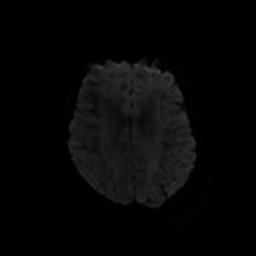
[im 124/160]
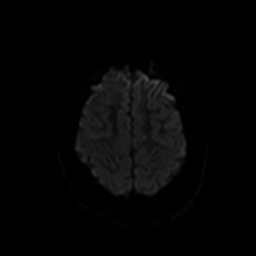
[im 142/160]
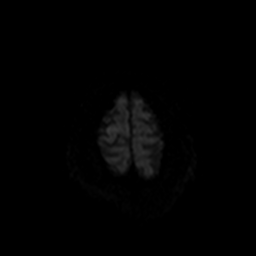
[im 160/160]
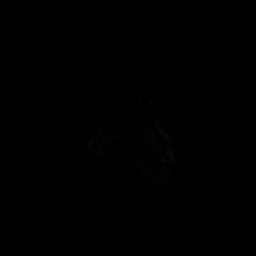

[Series 7: ax dwi_tracew · axial · 3.0mm · 0.94mm/px · z∈[-133,+1]mm · 5 of 80 slices shown]
[im 1/80]
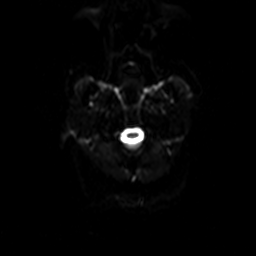
[im 20/80]
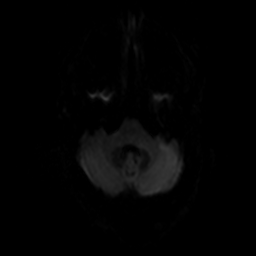
[im 40/80]
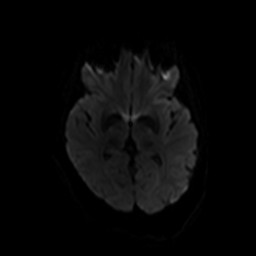
[im 60/80]
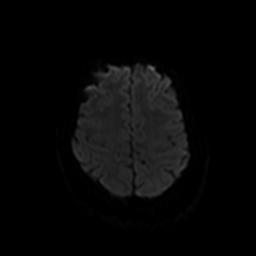
[im 80/80]
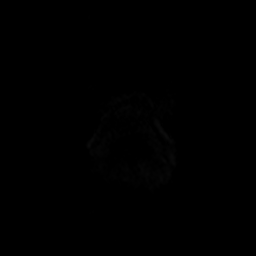

[Series 8: ax dwi_adc · axial · 3.0mm · 0.94mm/px · z∈[-133,+1]mm · 3 of 40 slices shown]
[im 1/40]
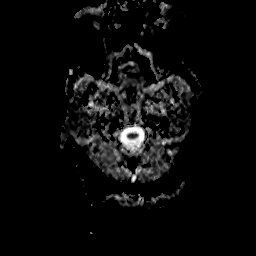
[im 20/40]
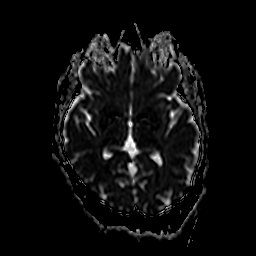
[im 40/40]
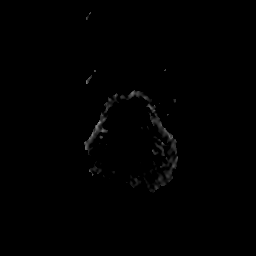

[Series 9: DWI · coronal · 5.0mm · 1.44mm/px · 4 of 60 slices shown (2 of 3)]
[im 1/60]
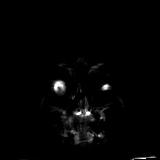
[im 20/60]
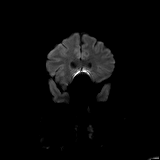
[im 40/60]
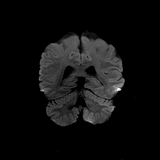
[im 60/60]
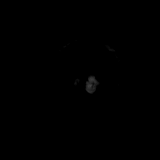

[Series 10: DWI · coronal · 5.0mm · 1.44mm/px · 2 of 30 slices shown (3 of 3)]
[im 1/30]
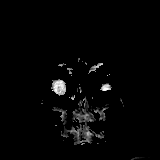
[im 30/30]
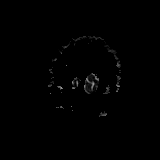

[Series 11: T2 · axial · 4.0mm · 0.36mm/px · z∈[-131,+4]mm · 2 of 28 slices shown (1 of 2)]
[im 1/28]
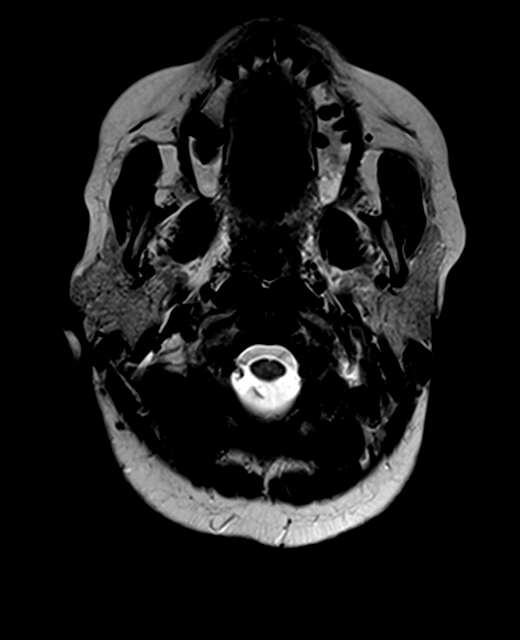
[im 28/28]
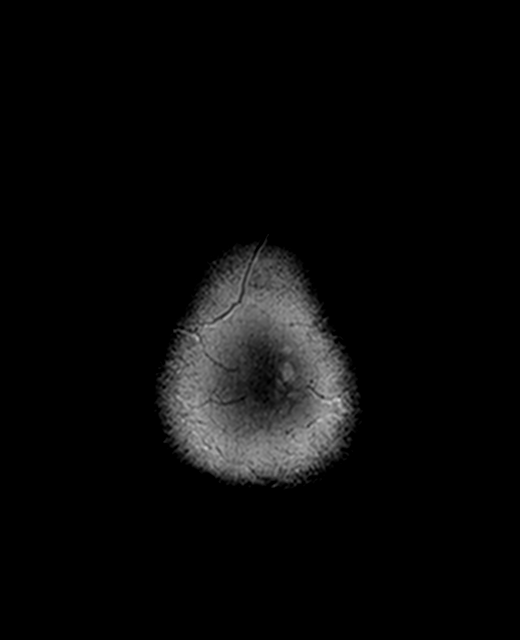

[Series 12: FLAIR · axial · 3.0mm · 0.72mm/px · z∈[-134,+11]mm · 2 of 26 slices shown]
[im 1/26]
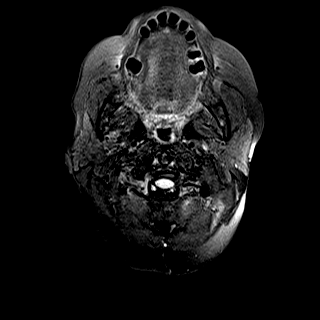
[im 26/26]
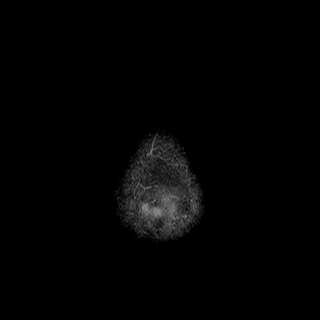

[Series 13: swi_images · axial · 1.5mm · 0.90mm/px · z∈[-132,+5]mm · 6 of 96 slices shown]
[im 1/96]
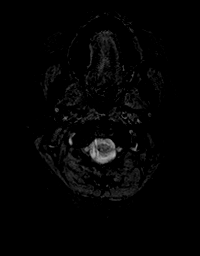
[im 20/96]
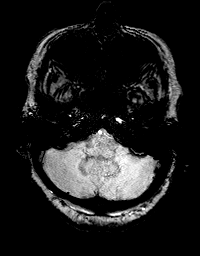
[im 39/96]
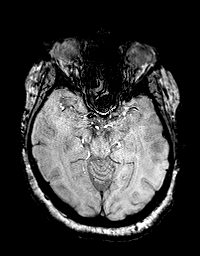
[im 58/96]
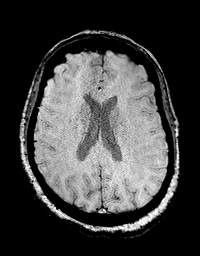
[im 77/96]
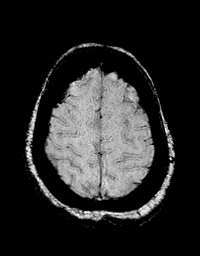
[im 96/96]
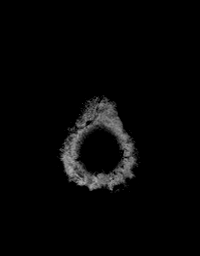

[Series 15: T1 · axial · 1.0mm · 0.94mm/px · z∈[-149,+0]mm · 10 of 160 slices shown (2 of 2)]
[im 1/160]
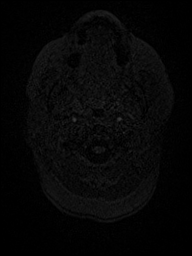
[im 18/160]
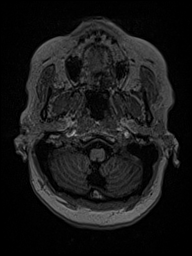
[im 36/160]
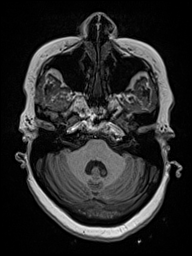
[im 54/160]
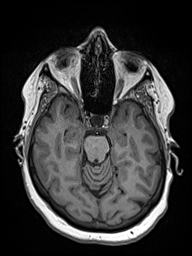
[im 71/160]
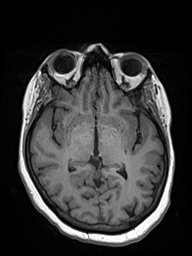
[im 89/160]
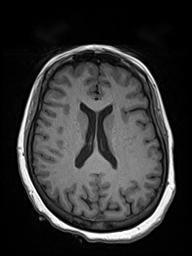
[im 107/160]
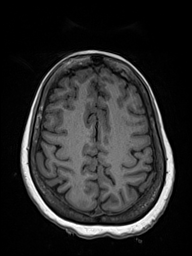
[im 124/160]
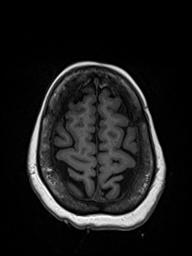
[im 142/160]
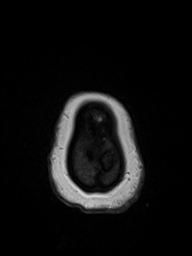
[im 160/160]
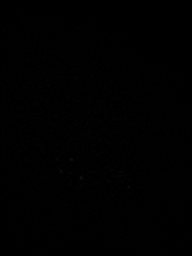

[Series 16: T2 · coronal · 4.5mm · 0.36mm/px · 2 of 30 slices shown (2 of 2)]
[im 1/30]
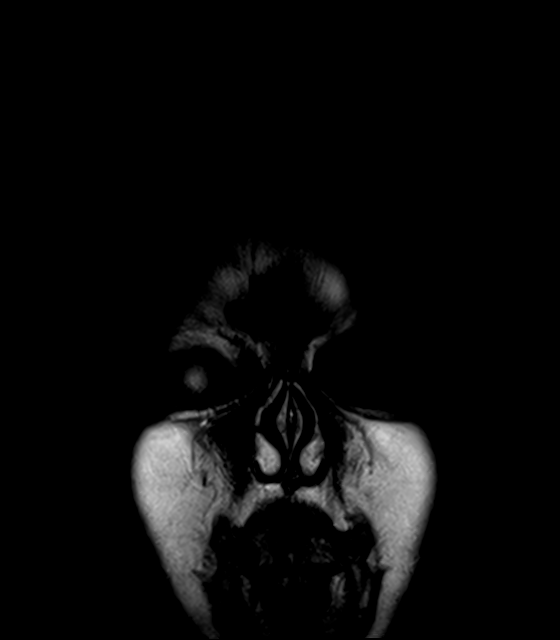
[im 30/30]
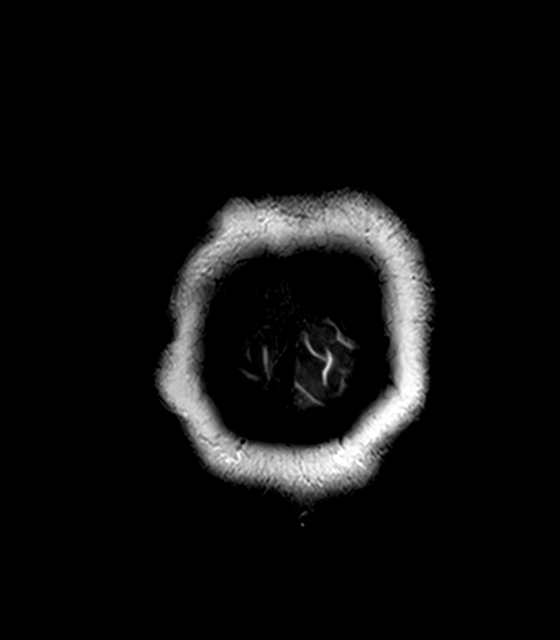

[48 of 48 positions shown; findings below may reference images not displayed]

FINDINGS: Brain:

Mild intermittent motion degradation.

Mild generalized cerebellar atrophy.

Partially empty and slightly expanded sella turcica.

No cortical encephalomalacia is identified. No significant cerebral
white matter disease for age.

There is no acute infarct.

No evidence of an intracranial mass.

No chronic intracranial blood products.

No extra-axial fluid collection.

No midline shift.

Vascular: Maintained flow voids within the proximal large arterial
vessels.

Skull and upper cervical spine: No focal suspicious marrow lesion.

Sinuses/Orbits: Visualized orbits show no acute finding. Mild
mucosal thickening within the left maxillary sinus.
IMPRESSION: Partially empty and slightly expanded sella turcica. While these
findings can reflect incidental anatomic variation, they can also be
associated with idiopathic intracranial hypertension (pseudotumor
cerebri).

Mild generalized cerebellar atrophy.

Otherwise unremarkable non-contrast MRI appearance of the brain for
age.

Mild mucosal thickening within the left maxillary sinus.

## 2021-02-20 NOTE — Therapy (Signed)
[place]. [place], [place], [address] Phone: [phone]   Fax:  [phone]  Occupational Therapy Evaluation  Patient Details  Name: Connie Hoffman MRN: [id number] Date of Birth: [birth year] No data recorded  Encounter Date: 02/19/2021   OT End of Session - 02/19/21 1603     Visit Number 1    Number of Visits 9    Date for OT Re-Evaluation 05/14/21    Authorization Type [employer] MCR (primary); [employer] [place] (secondary)    Authorization Time Period VL: [place]    OT Start Time 1540    OT Stop Time 1622    OT Time Calculation (min) 42 min    Activity Tolerance Patient limited by pain    Behavior During Therapy [facility] for tasks assessed/performed            Past Medical History:  Diagnosis Date   DDD (degenerative disc disease), lumbar    Psoriatic arthritis ([place])    Spinal stenosis     Past Surgical History:  Procedure Laterality Date   BACK SURGERY  05/18/2019   L4 and L5   CHOLECYSTECTOMY     CYSTECTOMY  07/2017   JOINT REPLACEMENT Left    left knee   LAPAROSCOPIC ENDOMETRIOSIS FULGURATION  2011   LAPAROSCOPIC GASTRIC SLEEVE RESECTION  2018   LAPAROSCOPIC OOPHERECTOMY Right    LUMBAR EPIDURAL INJECTION  04/13/2018   REVISION TOTAL KNEE ARTHROPLASTY Left 12/2017   TOTAL SHOULDER ARTHROPLASTY Bilateral    TUBAL LIGATION     postpartum    There were no vitals filed for this visit.   Subjective Assessment - 02/19/21 1544     Subjective  Pt arrives to session w/ primary concerns of bilateral hand pain; reports RUE consistently more symptomatic/painful than LUE. Pt states her symptoms began approx 2 years ago (beginning of 2021) and that her hands will go numb when held in certain positions. Occasionally experiences hand cramping during activities such as braiding hair, quilting, opening a tight jar. After completing paper version of UEFI involving circling number ratings (~3 min duration), pt reported  significant throbbing pain in entire R hand/wrist due to holding pen.    Pertinent History NCS/EMG of the arms on 05/08/20: bilateral ulnar neuropathy w/ slowing across the elbow, predominantly demyelinating, mild-to-mod and worse on the R; no evidence of a cervical radiculopathy or carpal tunnel syndrome affecting either UE; PMH includes psoriatic arthritis, chronic LBP/lumbar radiculopathy w/ hx of back surgery (L4 and L5) in 2021, degenerative disc disease (DDD), osteoarthritis, prediabetes, anxiety, hx of L knee replacement 2019, hx bilateral total shoulder arthroplasty, mixed HLD, hx of gastric sleeve, and high risk medication use    Repetition Increases Symptoms    Patient Stated Goals "At least be able to function"    Currently in Pain? No/denies   Increased pain/dyesthesia w/ activity and assessments completed during evaluation            [facility] OT Assessment - 02/19/21 1547       Assessment   Medical Diagnosis bilateral hand pain/neuropathy    Referring Provider (OT) Narda Amber, DO   Neurology   Onset Date/Surgical Date --   symptoms started ~2 years ago   Hand Dominance Right    Prior Therapy PT previously      Precautions   Precautions None      Balance Screen   Has the patient fallen in the past 6 months No      Home  Environment   Type of [place] - single point    Lives With Spouse;Daughter      Prior Function   Level of Independence Independent with basic ADLs    Vocation On disability    Leisure Cutting and making blankets, hair braiding, gardening      ADL   Eating/Feeding Needs assist with cutting food    Grooming Modified independent    Upper Body Bathing Independent    Lower Body Bathing Independent    Upper Body Dressing Needs assist for fasteners    Lower Body Dressing Needs assist for fasteners      IADL   Shopping Takes care of all shopping needs independently    [place]  light daily tasks such as dishwashing, bed making    Meal Prep Plans, prepares and serves adequate meals independently   Often needs help w/ opening tight jars/containers and [place] own vehicle    Medication Management Is responsible for taking medication in correct dosages at correct time    Physiological scientist financial matters independently (budgets, writes checks, pays rent, bills goes to bank), collects and keeps track of income      Written Expression   Dominant Hand Right    Written Experience Unable to assess (comment)   Due to pain     Activity Tolerance   Activity Tolerance Comments Reports she often gets fatigued; increased generalized joint stiffness in mornings      Cognition   Overall Cognitive Status Within Functional Limits for tasks assessed   pt reports some increased difficulty w/ memory recently     Observation/Other Assessments   Upper Extremity Functional Index  31/80      Sensation   Light Touch Appears Intact    Hot/Cold Appears Intact   per pt report     Coordination   Gross Motor Movements are Fluid and Coordinated Yes    Fine Motor Movements are Fluid and Coordinated No    9 Hole Peg Test Right;Left    Right 9 Hole Peg Test 28 sec    Left 9 Hole Peg Test 23 sec      Edema   Edema Reports occasional swelling in her hands      ROM / Strength   AROM / PROM / Strength AROM;Strength      AROM   Overall AROM  Within functional limits for tasks performed    AROM Assessment Site Shoulder;Elbow;Forearm;Wrist      Hand Function   Right Hand Gross Grasp Impaired    Right Hand Grip (lbs) 6 lbs    Left Hand Gross Grasp Functional    Left Hand Grip (lbs) 29 lbs    Comment Pinch not tested due to pain             OT Education - 02/19/21 1617     Education Details Education provided on role and purpose of OT, as well as potential interventions and goals for therapy based on initial evaluation findings.  Introduced nerve gliding exercises w/ pt returning demonstration and provided corresponding handout including considerations and precautions for positioning/body mechanics for symptoms prevention (see pt instructions)    Person(s) Educated Patient    Methods Explanation;Demonstration;Handout    Comprehension Verbalized understanding;Returned demonstration             OT Short Term Goals - 02/19/21 1646  OT SHORT TERM GOAL #1   Title Pt will verbalize understanding of condition-specific education regarding appropriate positioning, body mechanics, and considerations to decrease/prevent symptoms    Baseline Decreased knowledge of precautions    Time 2    Period Weeks    Status New    Target Date 03/05/21      OT SHORT TERM GOAL #2   Title Pt will demonstrate independence w/ wear and care of pre-fabricated elbow pad and/or custom-fabricated static elbow orthosis to assist w/ symptom management    Baseline Reports alternating between compression sleeves and wrist immobilization brace on hands/wrists    Time 4    Period Weeks    Status New    Target Date 03/19/21      OT SHORT TERM GOAL #3   Title Pt will demonstrate independence w/ compensatory strategies, including AE prn, to decrease symptoms and improve participation in functional tasks (handwriting, cutting food, clothing fasteners)    Baseline Decreased knowledge of compensatory strategies    Time 4    Period Weeks    Status New    Target Date 03/19/21             OT Long Term Goals - 02/19/21 1650       OT LONG TERM GOAL #1   Title Pt will increase Upper Extremity Functional Index score by at least 15 to indicate improved functional use of BUEs    Baseline UEFI: 31/80    Time 8    Period Weeks    Status New    Target Date 04/16/21      OT LONG TERM GOAL #2   Title Pt will be able to manipulate clothing fasteners (tying, buttons, zipper) w/ reported pain level 4/10 or less by d/c    Baseline Needs assist  for fasteners    Time 8    Period Weeks    Status New    Target Date 04/16/21      OT LONG TERM GOAL #3   Title Pt will improve FMC/dexterity to faciliate increased independence w/ functional FM tasks as evidenced by decreasing 9-Hole Peg Test time by at least 5 seconds w/ R, dominant UE    Baseline RUE 28 sec; LUE 23 sec    Time 8    Period Weeks    Status New    Target Date 04/16/21      OT LONG TERM GOAL #4   Title Pt will improve R hand grip strength (dominant side) by at least 10 lbs to improve participation/safety and decrease drops during functional tasks    Baseline RUE 6 lbs; LUE 29 lbs    Time 8    Period Weeks    Status New    Target Date 04/16/21             Plan - 02/19/21 1623     Clinical Impression Statement Pt is a 60 y/o female who presents to OP OT due to BUE neuropathy. Pt currently lives with her family in a single-level house and is on disability. PMHx includes psoriatic arthritis, chronic LBP/lumbar radiculopathy w/ hx of back surgery (L4 and L5) in 2021, degenerative disc disease (DDD), osteoarthritis, prediabetes, anxiety, hx of L knee replacement 2019, hx bilateral total shoulder arthroplasty, mixed HLD, hx of gastric sleeve, and high risk medication use. Pt currently demonstrates deficits w/ functional use of BUE, decreased strength, and parethesias/dysesthesias w/ RUE more symptomatic than LUE. Pt will benefit from skilled occupational therapy services to  address aforementioned limitations as well as ROM, pain management, [place] and dexterity, condition-specific education, introduction of compensatory strategies/AE prn, and implementation of an HEP to improve participation and decreased/prevent symptoms during daily activities.    OT Occupational Profile and History Problem Focused Assessment - Including review of records relating to presenting problem    Occupational performance deficits (Please refer to evaluation for details): ADL's;IADL's;Rest and  Sleep;Leisure;Social Participation    Body Structure / Function / Physical Skills ADL;Strength;Dexterity;GMC;Pain;Body mechanics;Edema;UE functional use;IADL;ROM;Endurance;Sensation;Decreased knowledge of precautions;FMC;Muscle spasms    Rehab Potential Good    Clinical Decision Making Several treatment options, min-mod task modification necessary    Comorbidities Affecting Occupational Performance: Presence of comorbidities impacting occupational performance    Modification or Assistance to Complete Evaluation  Min-Moderate modification of tasks or assist with assess necessary to complete eval    OT Frequency 1x / week    OT Duration 8 weeks    OT Treatment/Interventions Self-care/ADL training;Moist Heat;Fluidtherapy;DME and/or AE instruction;Splinting;Therapeutic activities;Ultrasound;Therapeutic exercise;Cryotherapy;Neuromuscular education;Passive range of motion;Electrical Stimulation;Paraffin;Energy conservation;Manual Therapy;Patient/family education    Plan Review ulnar nerve glides; discuss/provide handout for self-purchase of elbow pad; RUE static elbow orthosis for nighttime wear?    OT Home Exercise Plan Ulnar nerve glides    Consulted and Agree with Plan of Care Patient            Patient will benefit from skilled therapeutic intervention in order to improve the following deficits and impairments:   Body Structure / Function / Physical Skills: ADL, Strength, Dexterity, GMC, Pain, Body mechanics, Edema, UE functional use, IADL, ROM, Endurance, Sensation, Decreased knowledge of precautions, [place], Muscle spasms   Visit Diagnosis: Pain in right hand  Pain in left hand  Paresthesia of skin  Other disturbances of skin sensation  Other lack of coordination  Muscle weakness (generalized)   Problem List Patient Active Problem List   Diagnosis Date Noted   Midline cystocele 05/23/2020   Pelvic mass 12/04/2019   DDD (degenerative disc disease), lumbar 01/21/2018   Primary  osteoarthritis of both hands 01/21/2018   Primary osteoarthritis of both feet 01/21/2018   Psoriasis 12/01/2017   Psoriatic arthritis ([place]) 12/01/2017   High risk medication use 12/01/2017   S/P total knee replacement, 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   Thrombocytosis 12/01/2017   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    Kathrine Cords, OTR/L, MSOT 02/20/2021, 4:56 PM  [place]. [place], [place], [address] Phone: [phone]   Fax:  [phone]  Name: Connie Hoffman MRN: [id number] Date of Birth: [birth year]

## 2021-02-24 ENCOUNTER — Telehealth: Payer: Self-pay | Admitting: Neurology

## 2021-02-24 DIAGNOSIS — G932 Benign intracranial hypertension: Secondary | ICD-10-CM

## 2021-02-24 NOTE — Telephone Encounter (Signed)
Patient called to see if her MRI results are ready for her yet.

## 2021-02-25 NOTE — Telephone Encounter (Signed)
Called patient and discussed results of MRI brain which shows partially empty and slightly expanded sella turcica. Although this can be normal variation, with her ongoing headaches, I recommend that we evaluate her for idiopathic intracranial hypertension (pseudotumor cerebri) with large volume LP.  She is in agreement with plan.  Jerl Munyan K. Posey Pronto, DO

## 2021-02-26 ENCOUNTER — Ambulatory Visit: Payer: Medicare Other | Admitting: Occupational Therapy

## 2021-02-27 NOTE — Addendum Note (Signed)
Addended by: Armen Pickup A on: 02/27/2021 10:27 AM   Modules accepted: Orders

## 2021-03-04 ENCOUNTER — Other Ambulatory Visit: Payer: Self-pay

## 2021-03-04 ENCOUNTER — Ambulatory Visit
Admission: RE | Admit: 2021-03-04 | Discharge: 2021-03-04 | Disposition: A | Discharge status: Home / Self Care | Payer: Medicare Other | Source: Ambulatory Visit | Attending: Neurology | Admitting: Neurology

## 2021-03-04 DIAGNOSIS — G932 Benign intracranial hypertension: Secondary | ICD-10-CM

## 2021-03-04 LAB — GLUCOSE, CSF: Glucose, CSF: 54 mg/dL (ref 40–80)

## 2021-03-04 LAB — CSF CELL COUNT WITH DIFFERENTIAL
RBC Count, CSF: 3400 cells/uL — ABNORMAL HIGH
WBC, CSF: 0 cells/uL (ref 0–5)

## 2021-03-04 LAB — PROTEIN, CSF: Total Protein, CSF: 70 mg/dL — ABNORMAL HIGH (ref 15–45)

## 2021-03-04 IMAGING — XA DG SPINAL PUNCT LUMBAR DIAG WITH FL CT GUIDANCE
2 series · 2 of 2 positions shown · non-contrast
Comparison: Radiography [DATE]

CLINICAL DATA: Idiopathic intracranial hypertension.  Headache.

EXAM:
DIAGNOSTIC LUMBAR PUNCTURE UNDER FLUOROSCOPIC GUIDANCE

[Series 1: ortho standard · 1 of 1 slices shown (1 of 2)]
[im 1/1]
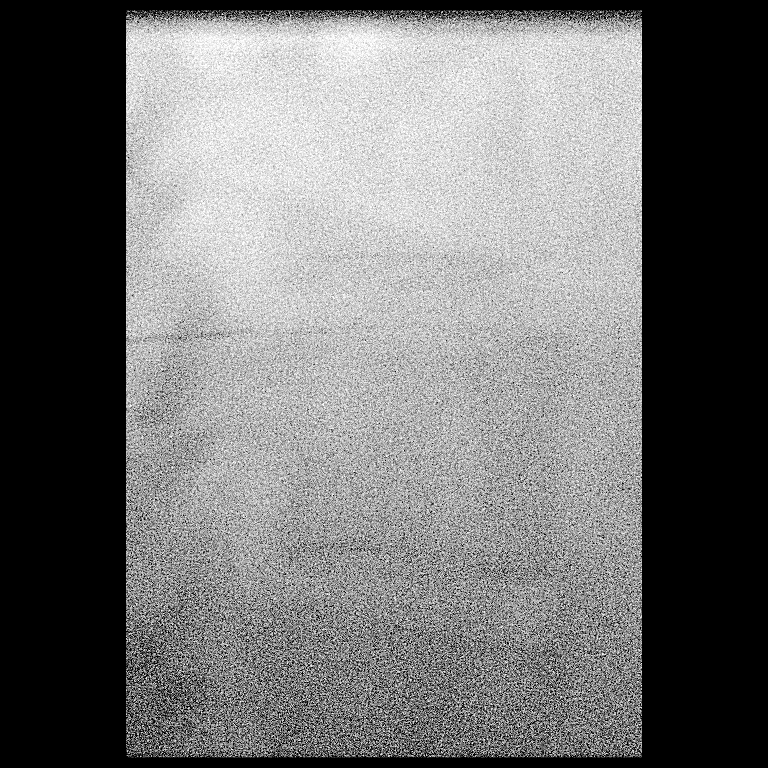

[Series 2: ortho standard · 1 of 1 slices shown (2 of 2)]
[im 1/1]
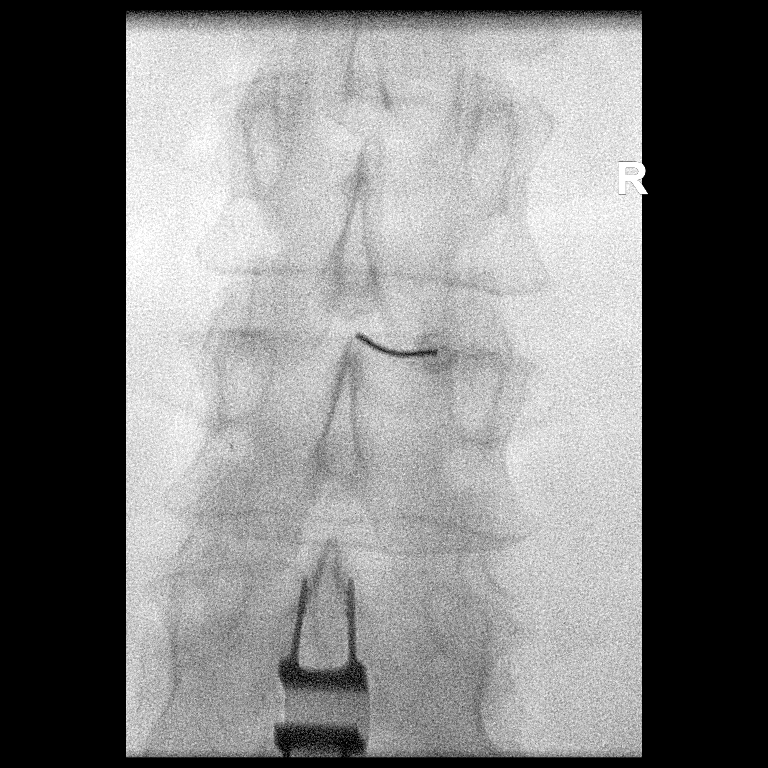

[2 of 2 positions shown; findings below may reference images not displayed]

FLUOROSCOPY TIME:  Fluoroscopy Time: 0 minutes 40 seconds.
micro gray meter squared

PROCEDURE:
Informed consent was obtained from the patient prior to the
procedure, including potential complications of headache, allergy,
and pain. With the patient prone, the lower back was prepped with
Betadine. 1% Lidocaine was used for local anesthesia. Lumbar
puncture was performed at the right L2-3 level using a 6 inch 20
gauge needle with return of clear, slightly bloody CSF with an
opening pressure of 15 cm water, measured in the lateral decubitus
position. 4 ml of CSF were obtained for laboratory studies. Closing
pressure was 8 cm water. The patient tolerated the procedure well
and there were no apparent complications.
IMPRESSION: Lumbar puncture on the right at L2-3. 6 inch needle necessary.
Normal opening pressure, 15 cm water.

## 2021-03-04 NOTE — Discharge Instructions (Signed)

## 2021-03-05 ENCOUNTER — Ambulatory Visit: Payer: Medicare Other | Admitting: Occupational Therapy

## 2021-03-12 ENCOUNTER — Encounter: Payer: Self-pay | Admitting: Occupational Therapy

## 2021-03-12 ENCOUNTER — Ambulatory Visit: Payer: Medicare Other | Attending: Neurology | Admitting: Occupational Therapy

## 2021-03-12 ENCOUNTER — Other Ambulatory Visit: Payer: Self-pay

## 2021-03-12 DIAGNOSIS — M6281 Muscle weakness (generalized): Secondary | ICD-10-CM | POA: Diagnosis present

## 2021-03-12 DIAGNOSIS — M79641 Pain in right hand: Secondary | ICD-10-CM

## 2021-03-12 DIAGNOSIS — M79642 Pain in left hand: Secondary | ICD-10-CM

## 2021-03-12 DIAGNOSIS — R208 Other disturbances of skin sensation: Secondary | ICD-10-CM | POA: Diagnosis present

## 2021-03-12 DIAGNOSIS — R202 Paresthesia of skin: Secondary | ICD-10-CM

## 2021-03-12 DIAGNOSIS — R278 Other lack of coordination: Secondary | ICD-10-CM

## 2021-03-14 NOTE — Therapy (Signed)
[place]. [place], [place], [address] Phone: [phone]   Fax:  [phone]  Occupational Therapy Treatment  Patient Details  Name: Connie Hoffman MRN: [id number] Date of Birth: 04-03-1961 Referring Provider (OT): Narda Amber, DO (Neurology)   Encounter Date: 03/12/2021   OT End of Session - 03/12/21 0944       Visit Number 2   Number of Visits 9   Date for OT Re-Evaluation 05/14/2021   Authorization Type [employer] MCR (primary); MCD [place] (secondary)   Authorization Time Period VL: [place]   OT Start Time 0933   OT Stop Time 1012   OT Time Calculation (min) 39 min   Activity Tolerance Patient limited by pain   Behavior During Therapy [facility] for tasks assessed/performed            Past Medical History:  Diagnosis Date   DDD (degenerative disc disease), lumbar    Psoriatic arthritis ([place])    Spinal stenosis     Past Surgical History:  Procedure Laterality Date   BACK SURGERY  05/18/2019   L4 and L5   CHOLECYSTECTOMY     CYSTECTOMY  07/2017   JOINT REPLACEMENT Left    left knee   LAPAROSCOPIC ENDOMETRIOSIS FULGURATION  2011   LAPAROSCOPIC GASTRIC SLEEVE RESECTION  2018   LAPAROSCOPIC OOPHERECTOMY Right    LUMBAR EPIDURAL INJECTION  04/13/2018   REVISION TOTAL KNEE ARTHROPLASTY Left 12/2017   TOTAL SHOULDER ARTHROPLASTY Bilateral    TUBAL LIGATION     postpartum    There were no vitals filed for this visit.   Subjective Assessment - 03/12/21 0938       Subjective Pt reports she raked leaves in her yard yesterday and felt like that activity aggravated her hand pain. Pt also states she recently had a lumbar puncture which has since resolved chronic/severe headaches she was experiencing   Pertinent History NCS/EMG of the arms on 05/08/20: bilateral ulnar neuropathy w/ slowing across the elbow, predominantly demyelinating, mild-to-mod and worse on the R; no evidence of a cervical radiculopathy or carpal  tunnel syndrome affecting either UE; PMH includes psoriatic arthritis, chronic LBP/lumbar radiculopathy w/ hx of back surgery (L4 and L5) in 2021, degenerative disc disease (DDD), osteoarthritis, prediabetes, anxiety, hx of L knee replacement 2019, hx bilateral total shoulder arthroplasty, mixed HLD, hx of gastric sleeve, and high risk medication use   Repetition Increases Symptoms   Patient Stated Goals "At least be able to function"   Currently in Pain? Yes   Pain Location Hand   Pain Orientation Achieved   Indicated L CMC and R MCPs   Pain Descriptors / Indicators Aching   Pain Type Chronic pain   Pain Onset Today   Pain Relieving Factors Warm soak            Treatment/Exercises - 03/12/21       Orthosis Education OT discussed benefit and options of pre-fabricated elbow pad w/ light compression to wear during daily activities; corresponding handout including examples administered to pt. Additionally discussed potential benefit of custom-fabricated static elbow orthosis for nighttime wear.    Hand Strengthening Full gross grasp of putty w/ R and L hand, focusing on attempting movement pattern against resistance opposed to straining to force movement; completed 2x15 each within tolerable level of discomfort to facilitate hand and forearm strengthening and improved functional use of BUEs.    Nerve Gliding Completed ulnar nerve gliding exercises w/ rest breaks between each: arm flexion  to chest height w/ pronation and wrist flexion to elbow flexion w/ pronation and wrist extension; mid range shoulder flexion w/ pronation and additional stretch of ring and little fingers w/ ipsilateral head tilt) to relaxed/flexed hand and finger w/ elbow flexion; and slight arm abduction w/ wrist extended and fingers relaxed w/ ipsilateral head tilt. OT provided education regarding potential for exercises to induce paresthesias and to d/c exercises if symptoms persisted after resting.            OT Short  Term Goals - 03/12/21 1529       OT SHORT TERM GOAL #1   Title Pt will verbalize understanding of condition-specific education regarding appropriate positioning, body mechanics, and considerations to decrease/prevent symptoms    Baseline Decreased knowledge of precautions    Time 2    Period Weeks    Status Achieved   02/19/21   Target Date 03/05/21      OT SHORT TERM GOAL #2   Title Pt will demonstrate independence w/ wear and care of pre-fabricated elbow pad and/or custom-fabricated static elbow orthosis to assist w/ symptom management    Baseline Reports alternating between compression sleeves and wrist immobilization brace on hands/wrists    Time 4    Period Weeks    Status On-going    Target Date 03/19/21      OT SHORT TERM GOAL #3   Title Pt will demonstrate independence w/ compensatory strategies, including AE prn, to decrease symptoms and improve participation in functional tasks (handwriting, cutting food, clothing fasteners)    Baseline Decreased knowledge of compensatory strategies    Time 4    Period Weeks    Status On-going    Target Date 03/19/21             OT Long Term Goals - 02/19/21 1650       OT LONG TERM GOAL #1   Title Pt will increase Upper Extremity Functional Index score by at least 15 to indicate improved functional use of BUEs    Baseline UEFI: 31/80    Time 8    Period Weeks    Status New    Target Date 04/16/21      OT LONG TERM GOAL #2   Title Pt will be able to manipulate clothing fasteners (tying, buttons, zipper) w/ reported pain level 4/10 or less by d/c    Baseline Needs assist for fasteners    Time 8    Period Weeks    Status New    Target Date 04/16/21      OT LONG TERM GOAL #3   Title Pt will improve FMC/dexterity to faciliate increased independence w/ functional FM tasks as evidenced by decreasing 9-Hole Peg Test time by at least 5 seconds w/ R, dominant UE    Baseline RUE 28 sec; LUE 23 sec    Time 8    Period Weeks     Status New    Target Date 04/16/21      OT LONG TERM GOAL #4   Title Pt will improve R hand grip strength (dominant side) by at least 10 lbs to improve participation/safety and decrease drops during functional tasks    Baseline RUE 6 lbs; LUE 29 lbs    Time 8    Period Weeks    Status New    Target Date 04/16/21             Plan - 03/12/21 1032       Clinical Impression  Statement Pt arrives for initial treatment session after being unable to attend prior scheduled sessions due to exacerbated health concerns. Considering this, OT discussed potentially completing 1 additional session prior to d/c and pt was agreeable to plan. This session, OT discussed benefit of both pre-fabricated and custom-fabricated orthoses in order to protect ulnar nerve from pressure, avoid aggravating movements (e.g., full/repetitive elbow flexion), and ultimately decrease symptoms. Ulnar nerve glides to mobilize the nerve and reduce pressure/compression also reviewed w/ pt returning demonstration of each; nerve glides w/ least aggravation of symptoms after included in HEP. Pt instructed to complete nerve glide exercises 2x/day, holding approximately 5-10 seconds, and completing 5 repetitions.   OT Occupational Profile and History Problem Focused Assessment - Including review of records relating to presenting problem   Occupational performance deficits (Please refer to evaluation for details): ADL's; IADL's; Rest and Sleep; Leisure; Social Participation   Pt will benefit from skilled therapeutic intervention in order to improve on the following performance deficits Body Structure / Function / Physical Skills    Body Structure / Function / Physical Skills ADL; Strength; Dexterity; [place]; Pain; Body mechanics; Edema; UE functional use; IADL; ROM; Endurance; Sensation; Decreased knowledge of precautions; [place]; Muscle spasms    Rehab Potential Good   Clinical Decision Making Several treatment options, min-mod task  modification necessary   Comorbidities Affecting Occupational Performance: Presence of comorbidities impacting occupational performance   Modification or Assistance to Complete Evaluation  Min-Moderate modification of tasks or assist with assess necessary to complete eval   OT Frequency 1x / week   OT Duration 8 weeks   OT Treatment/Interventions Self-care/ADL training; Moist Heat; Fluidtherapy; DME and/or AE instruction; Splinting; Therapeutic activities; Ultrasound; Therapeutic exercise; Cryotherapy; Neuromuscular education; Passive range of motion; Electrical Stimulation; Paraffin; Energy conservation; Manual Therapy; Patient/family education   Plan RUE static elbow orthosis for nighttime wear; review nerve glides; discuss compensatory strategies for activities requiring prolong elbow flexion (e.g., clothing fasteners, sweeping/raking, etc.)   OT Home Exercise Plan Ulnar nerve glides   Consulted and Agree with Plan of Care Patient           Patient will benefit from skilled therapeutic intervention in order to improve the following deficits and impairments:   Body Structure / Function / Physical Skills: ADL, Strength, Dexterity, GMC, Pain, Body mechanics, Edema, UE functional use, IADL, ROM, Endurance, Sensation, Decreased knowledge of precautions, [place], Muscle spasms   Visit Diagnosis: Pain in right hand  Pain in left hand  Paresthesia of skin  Other disturbances of skin sensation  Other lack of coordination  Muscle weakness (generalized)   Problem List Patient Active Problem List   Diagnosis Date Noted   Midline cystocele 05/23/2020   Pelvic mass 12/04/2019   DDD (degenerative disc disease), lumbar 01/21/2018   Primary osteoarthritis of both hands 01/21/2018   Primary osteoarthritis of both feet 01/21/2018   Psoriasis 12/01/2017   Psoriatic arthritis ([place]) 12/01/2017   High risk medication use 12/01/2017   S/P total knee replacement, 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   Thrombocytosis  12/01/2017   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    Kathrine Cords, OTR/L, MSOT 03/12/2021, 3:09 PM  [facility] [address] [place], [place], [address] Phone: [phone]   Fax:  [phone]  Name: Jenene Kauffmann MRN: [id number] Date of Birth: [birth year]

## 2021-03-19 ENCOUNTER — Ambulatory Visit: Payer: Medicare Other | Admitting: Occupational Therapy

## 2021-03-26 ENCOUNTER — Ambulatory Visit: Payer: Medicare Other | Admitting: Occupational Therapy

## 2021-03-28 ENCOUNTER — Ambulatory Visit: Payer: Medicare Other | Attending: Neurology | Admitting: Occupational Therapy

## 2021-03-29 ENCOUNTER — Other Ambulatory Visit: Payer: Self-pay | Admitting: Physician Assistant

## 2021-03-31 NOTE — Progress Notes (Deleted)
[facility] Return Visit  SUBJECTIVE  History of Present Illness: Shawntavia Stroschein is a 60 y.o. female seen in follow-up for pelvic pain. Advised to take methocarbamol twice a day, as she has been taking this prn. She was also referred to pelvic physical therapy.   Past Medical History: Patient  has a past medical history of DDD (degenerative disc disease), lumbar, Psoriatic arthritis ([place]), and Spinal stenosis.   Past Surgical History: She  has a past surgical history that includes Joint replacement (Left); Total shoulder arthroplasty (Bilateral); Cholecystectomy; Laparoscopic gastric sleeve resection (2018); Cystectomy (07/2017); Revision total knee arthroplasty (Left, 12/2017); Lumbar epidural injection (04/13/2018); Back surgery (05/18/2019); Laparoscopic endometriosis fulguration (2011); Laparoscopic oopherectomy (Right); and Tubal ligation.   Medications: She has a current medication list which includes the following prescription(s): atorvastatin, cosentyx sensoready (300 mg), duloxetine, folic acid, gabapentin, isosorbide mononitrate, ketoconazole, biofreeze, methocarbamol, methotrexate, methotrexate, metoprolol succinate, multivitamin with minerals, ozempic (0.25 or 0.5 mg/dose), pantoprazole, prednisone, triamcinolone ointment, and tuberculin syr 1cc/27gx1/2".   Allergies: Patient is allergic to aspirin and tylenol [acetaminophen].   Social History: Patient  reports that she has never smoked. She has never used smokeless tobacco. She reports that she does not drink alcohol and does not use drugs.      OBJECTIVE     Physical Exam: There were no vitals filed for this visit.  Gen: No apparent distress, A&O x 3.  Detailed Urogynecologic Evaluation:  Deferred. Prior exam showed: POP-Q:    POP-Q   0                                            Aa   0                                           Ba   -4.5                                              C    4                                             Gh   4                                            Pb   7                                            tvl    -2                                            Ap   -2  Bp   -6                                              D        ASSESSMENT AND PLAN    Ms. Erlandson is a 60 y.o. with:  No diagnosis found.  Levator spasm - Has methocarbamol at home which she takes as needed. Advised to take twice a day for a week and then as needed after to help reduce pain.  - She is also interested in attending physical therapy- referral reordered.   POP - for prolapse we discussed that if it is not bothersome, then further treatment is not needed at this time.  - Pelvic floor exercises from PT can help prevent progression.    Return 2 months for follow up  Jaquita Folds, MD  Time spent: I spent 20 minutes dedicated to the care of this patient on the date of this encounter to include pre-visit review of records, face-to-face time with the patient and post visit documentation.

## 2021-03-31 NOTE — Telephone Encounter (Signed)
Next Visit: 05/21/2021  Last Visit: 12/19/2020  Last Fill: 01/28/2021  VT:[id number] arthritis   Current Dose per office note 12/19/2020: Cosentyx 150 mg sq injections once every 14 days  Labs: 03/21/2021 CO2 31, CBC WNL  TB Gold: 05/01/2020 Neg    Okay to refill Cosentyx?

## 2021-04-01 ENCOUNTER — Ambulatory Visit: Payer: Medicare Other | Admitting: Obstetrics and Gynecology

## 2021-04-08 ENCOUNTER — Telehealth: Payer: Self-pay

## 2021-04-08 NOTE — Telephone Encounter (Signed)
PA renewal initiated automatically by CoverMyMeds.  Submitted a Prior Authorization request to [facility] for COSENTYX via CoverMyMeds. Will update once we receive a response.   Key: [id number]

## 2021-04-08 NOTE — Telephone Encounter (Signed)
Received notification from [facility] regarding a prior authorization for [place]. Authorization has been APPROVED from 04/08/2021 to 04/26/2022. Approval letter sent to scan center.  Authorization # [id number]

## 2021-04-27 HISTORY — PX: ULNAR NERVE REPAIR: SHX2594

## 2021-04-27 HISTORY — PX: TRIGGER FINGER RELEASE: SHX641

## 2021-05-07 NOTE — Progress Notes (Deleted)
Office Visit Note  Patient: Connie Hoffman             Date of Birth: 1960/07/22           MRN: [id number]             PCP: Jenel Lucks, PA-C Referring: Burna Cash* Visit Date: 05/21/2021 Occupation: @GUAROCC @  Subjective:  No chief complaint on file.   History of Present Illness: Connie Hoffman is a 61 y.o. female ***   Activities of Daily Living:  Patient reports morning stiffness for *** {minute/hour:19697}.   Patient {ACTIONS;DENIES/REPORTS:21021675::"Denies"} nocturnal pain.  Difficulty dressing/grooming: {ACTIONS;DENIES/REPORTS:21021675::"Denies"} Difficulty climbing stairs: {ACTIONS;DENIES/REPORTS:21021675::"Denies"} Difficulty getting out of chair: {ACTIONS;DENIES/REPORTS:21021675::"Denies"} Difficulty using hands for taps, buttons, cutlery, and/or writing: {ACTIONS;DENIES/REPORTS:21021675::"Denies"}  No Rheumatology ROS completed.   PMFS History:  Patient Active Problem List   Diagnosis Date Noted   Midline cystocele 05/23/2020   Pelvic mass 12/04/2019   DDD (degenerative disc disease), lumbar 01/21/2018   Primary osteoarthritis of both hands 01/21/2018   Primary osteoarthritis of both feet 01/21/2018   Psoriasis 12/01/2017   Psoriatic arthritis ([place]) 12/01/2017   High risk medication use 12/01/2017   S/P total knee replacement, 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   Thrombocytosis 12/01/2017   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    Past Medical History:  Diagnosis Date   DDD (degenerative disc disease), lumbar    Psoriatic arthritis ([place])    Spinal stenosis     Family History  Problem Relation Age of Onset   Diabetes Mother    Hypertension Mother    Heart Problems Mother    Congestive Heart Failure Mother    Diabetes Father    Hypertension Father    Stroke Sister    Heart Problems Sister    Prostate cancer Brother    Diabetes Brother    Asthma Daughter    Healthy Daughter    Healthy Son    Healthy Son    Past Surgical History:   Procedure Laterality Date   BACK SURGERY  05/18/2019   L4 and L5   CHOLECYSTECTOMY     CYSTECTOMY  07/2017   JOINT REPLACEMENT Left    left knee   LAPAROSCOPIC ENDOMETRIOSIS FULGURATION  2011   LAPAROSCOPIC GASTRIC SLEEVE RESECTION  2018   LAPAROSCOPIC OOPHERECTOMY Right    LUMBAR EPIDURAL INJECTION  04/13/2018   REVISION TOTAL KNEE ARTHROPLASTY Left 12/2017   TOTAL SHOULDER ARTHROPLASTY Bilateral    TUBAL LIGATION     postpartum   Social History   Social History Narrative   Right handed    Lives with husband and 1 daughters    Immunization History  Administered Date(s) Administered   Influenza,inj,Quad PF,6+ Mos 04/15/2018   PFIZER(Purple Top)SARS-COV-2 Vaccination 07/15/2019, 08/05/2019, 03/13/2020   Pneumococcal Polysaccharide-23 04/15/2018   Zoster Recombinat (Shingrix) 05/13/2018, 10/25/2019     Objective: Vital Signs: There were no vitals taken for this visit.   Physical Exam   Musculoskeletal Exam: ***  CDAI Exam: CDAI Score: -- Patient Global: --; Provider Global: -- Swollen: --; Tender: -- Joint Exam 05/21/2021   No joint exam has been documented for this visit   There is currently no information documented on the homunculus. Go to the Rheumatology activity and complete the homunculus joint exam.  Investigation: No additional findings.  Imaging: No results found.  Recent Labs: Lab Results  Component Value Date   WBC 7.6 10/23/2020   HGB 12.2 10/23/2020   PLT 405 (H) 10/23/2020   NA 141 10/23/2020   K  4.1 10/23/2020   CL 106 10/23/2020   CO2 28 10/23/2020   GLUCOSE 96 10/23/2020   BUN 10 10/23/2020   CREATININE 0.72 10/23/2020   BILITOT 0.6 10/23/2020   ALKPHOS 99 10/23/2020   AST 21 10/23/2020   ALT 29 10/23/2020   PROT 6.9 10/23/2020   ALBUMIN 3.4 (L) 10/23/2020   CALCIUM 9.1 10/23/2020   GFRAA 103 10/08/2020   QFTBGOLDPLUS NEGATIVE 05/01/2020    Speciality Comments: Cosentyx start date:04/2018  Procedures:  No procedures  performed Allergies: Aspirin and Tylenol [acetaminophen]   Assessment / Plan:     Visit Diagnoses: No diagnosis found.  Orders: No orders of the defined types were placed in this encounter.  No orders of the defined types were placed in this encounter.   Face-to-face time spent with patient was *** minutes. Greater than 50% of time was spent in counseling and coordination of care.  Follow-Up Instructions: No follow-ups on file.   Earnestine Mealing, CMA  Note - This record has been created using Editor, commissioning.  Chart creation errors have been sought, but may not always  have been located. Such creation errors do not reflect on  the standard of medical care.

## 2021-05-21 ENCOUNTER — Ambulatory Visit: Payer: Medicare Other | Admitting: Rheumatology

## 2021-05-21 DIAGNOSIS — M19071 Primary osteoarthritis, right ankle and foot: Secondary | ICD-10-CM

## 2021-05-21 DIAGNOSIS — Z96652 Presence of left artificial knee joint: Secondary | ICD-10-CM

## 2021-05-21 DIAGNOSIS — L409 Psoriasis, unspecified: Secondary | ICD-10-CM

## 2021-05-21 DIAGNOSIS — M5136 Other intervertebral disc degeneration, lumbar region: Secondary | ICD-10-CM

## 2021-05-21 DIAGNOSIS — Z79899 Other long term (current) drug therapy: Secondary | ICD-10-CM

## 2021-05-21 DIAGNOSIS — Z9884 Bariatric surgery status: Secondary | ICD-10-CM

## 2021-05-21 DIAGNOSIS — M1711 Unilateral primary osteoarthritis, right knee: Secondary | ICD-10-CM

## 2021-05-21 DIAGNOSIS — R296 Repeated falls: Secondary | ICD-10-CM

## 2021-05-21 DIAGNOSIS — R252 Cramp and spasm: Secondary | ICD-10-CM

## 2021-05-21 DIAGNOSIS — M19041 Primary osteoarthritis, right hand: Secondary | ICD-10-CM

## 2021-05-21 DIAGNOSIS — G5623 Lesion of ulnar nerve, bilateral upper limbs: Secondary | ICD-10-CM

## 2021-05-21 DIAGNOSIS — L4050 Arthropathic psoriasis, unspecified: Secondary | ICD-10-CM

## 2021-05-21 DIAGNOSIS — D75839 Thrombocytosis, unspecified: Secondary | ICD-10-CM

## 2021-05-23 ENCOUNTER — Ambulatory Visit: Payer: Medicare Other | Admitting: Neurology

## 2021-05-27 NOTE — Progress Notes (Signed)
Office Visit Note  Patient: Connie Hoffman             Date of Birth: 1960-12-23           MRN: [id number]             PCP: Jenel Lucks, PA-C Referring: Burna Cash* Visit Date: 05/28/2021 Occupation: @GUAROCC @  Subjective:  Discuss recent ED visits   History of Present Illness: Connie Hoffman is a 61 y.o. female with history of psoriatic arthritis, osteoarthritis, and DDD. She is prescribed Cosentyx 150 mg sq injections once every 14 days.  She has been off of methotrexate since September 2022 due to elevated LFTs.  She has been experiencing increased pain and stiffness in both hands and both feet. She has ongoing pain due to trochanteric bursitis of both hips.  She has not found Cosentyx to be effective at managing her psoriatic arthritis recently. Since her last office visit the patient was diagnosed with bilateral pulmonary embolisms on 04/18/2021.  At that time Eliquis was initiated.  She followed up with her PCP on 05/06/2021 for further evaluation of hematuria.  At that time she was treated with Macrobid for 7 days for a possible UTI.  She had a routine cardiology appointment on 05/06/2021 as well at which time it was recommended for her to continue Eliquis and to continue to hold aspirin.  She had a consultation with pulmonology on 05/07/2021 and had updated PFTs at that time which were within normal limits.  She was then evaluated in the ED on 05/18/2021 and was diagnosed with diverticulitis of the sigmoid colon with an abscess.  She was started on IV Cipro and Flagyl and was discharged on oral antibiotics.  Her last dose of Cosentyx was on 05/22/2021 after she was discharged from the hospital.  She has an upcoming appointment with her PCP on 06/02/2021.      Activities of Daily Living:  Patient reports morning stiffness for 1 hour.   Patient Reports nocturnal pain.  Difficulty dressing/grooming: Reports Difficulty climbing stairs: Reports Difficulty  getting out of chair: Reports Difficulty using hands for taps, buttons, cutlery, and/or writing: Reports  Review of Systems  Constitutional:  Positive for fatigue.  HENT:  Positive for mouth dryness and nose dryness. Negative for mouth sores.   Eyes:  Positive for itching and dryness. Negative for pain and visual disturbance.  Respiratory:  Negative for cough, hemoptysis, shortness of breath and difficulty breathing.   Cardiovascular:  Negative for chest pain, palpitations, hypertension and swelling in legs/feet.  Gastrointestinal:  Positive for diarrhea. Negative for blood in stool and constipation.  Endocrine: Negative for increased urination.  Genitourinary:  Negative for difficulty urinating and painful urination.  Musculoskeletal:  Positive for joint pain, joint pain, joint swelling and morning stiffness. Negative for myalgias, muscle weakness, muscle tenderness and myalgias.  Skin:  Positive for rash. Negative for color change, pallor, hair loss, nodules/bumps, redness, skin tightness, ulcers and sensitivity to sunlight.  Allergic/Immunologic: Positive for susceptible to infections.  Neurological:  Positive for dizziness, numbness, memory loss and weakness. Negative for headaches.  Hematological:  Positive for bruising/bleeding tendency. Negative for swollen glands.  Psychiatric/Behavioral:  Positive for confusion. Negative for depressed mood and sleep disturbance. The patient is not nervous/anxious.    PMFS History:  Patient Active Problem List   Diagnosis Date Noted   Diverticulitis of large intestine with abscess without bleeding 05/28/2021   History of pulmonary embolism 05/28/2021   Chronic anticoagulation 05/28/2021  Midline cystocele 05/23/2020   Pelvic mass 12/04/2019   DDD (degenerative disc disease), lumbar 01/21/2018   Primary osteoarthritis of both hands 01/21/2018   Primary osteoarthritis of both feet 01/21/2018   Psoriasis 12/01/2017   Psoriatic arthritis ([place])  12/01/2017   High risk medication use 12/01/2017   S/P total knee replacement, 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   Thrombocytosis 12/01/2017   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    Past Medical History:  Diagnosis Date   Chronic stable angina (HCC)    DDD (degenerative disc disease), lumbar    Intestinal abscess    Psoriatic arthritis ([place])    Pulmonary embolism ([place])    Bilateral lungs   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[place])    Spinal stenosis     Family History  Problem Relation Age of Onset   Diabetes Mother    Hypertension Mother    Heart Problems Mother    Congestive Heart Failure Mother    Diabetes Father    Hypertension Father    Stroke Sister    Heart Problems Sister    Seizures Sister    Prostate cancer Brother    Diabetes Brother    Asthma Daughter    Healthy Daughter    Healthy Son    Healthy Son    Past Surgical History:  Procedure Laterality Date   BACK SURGERY  05/18/2019   L4 and L5   CHOLECYSTECTOMY     CYSTECTOMY  07/2017   JOINT REPLACEMENT Left    left knee   LAPAROSCOPIC ENDOMETRIOSIS FULGURATION  2011   LAPAROSCOPIC GASTRIC SLEEVE RESECTION  2018   LAPAROSCOPIC OOPHERECTOMY Right    LUMBAR EPIDURAL INJECTION  04/13/2018   REVISION TOTAL KNEE ARTHROPLASTY Left 12/2017   TOTAL SHOULDER ARTHROPLASTY Bilateral    TUBAL LIGATION     postpartum   Social History   Social History Narrative   Right handed    Lives with husband and 1 daughters    Immunization History  Administered Date(s) Administered   Influenza,inj,Quad PF,6+ Mos 04/15/2018   PFIZER(Purple Top)SARS-COV-2 Vaccination 07/15/2019, 08/05/2019, 03/13/2020   Pneumococcal Polysaccharide-23 04/15/2018   Zoster Recombinat (Shingrix) 05/13/2018, 10/25/2019     Objective: Vital Signs: BP 131/66 (BP Location: Left Arm, Patient Position: Sitting, Cuff Size: Large)    Pulse 71    Resp 13    Ht 5\' 5"  (1.651 m)    Wt 270 lb (122.5 kg)    BMI 44.93 kg/m    Physical Exam Vitals and nursing note reviewed.   Constitutional:      Appearance: She is well-developed.  HENT:     Head: Normocephalic and atraumatic.  Eyes:     Conjunctiva/sclera: Conjunctivae normal.  Pulmonary:     Effort: Pulmonary effort is normal.  Abdominal:     Palpations: Abdomen is soft.  Musculoskeletal:     Cervical back: Normal range of motion.  Skin:    General: Skin is warm and dry.     Capillary Refill: Capillary refill takes less than 2 seconds.  Neurological:     Mental Status: She is alert and oriented to person, place, and time.  Psychiatric:        Behavior: Behavior normal.     Musculoskeletal Exam: Generalized hyperalgesia.  C-spine has limited range of motion.  Some trapezius muscle tension and tenderness bilaterally.  Postural thoracic kyphosis noted.  Tenderness over both SI joints.  No midline spinal tenderness.  Shoulder joints, elbow joints, wrist joints, MCPs, PIPs, DIPs have good range of motion with  no synovitis.  She has PIP and DIP thickening consistent with osteoarthritis of both hands.  Tenderness over the right first MCP and PIP joint as well as the third MCP joint.  Complete fist formation bilaterally.  Hip joints have good range of motion with no groin pain.  Tenderness over both trochanteric bursa.  Left knee replacement has good range of motion.  Left knee replacement has warmth but no effusion.  Right knee joint has good range of motion with no warmth or effusion.  Ankle joints have good range of motion with no tenderness or joint swelling.  CDAI Exam: CDAI Score: -- Patient Global: --; Provider Global: -- Swollen: --; Tender: -- Joint Exam 05/28/2021   No joint exam has been documented for this visit   There is currently no information documented on the homunculus. Go to the Rheumatology activity and complete the homunculus joint exam.  Investigation: No additional findings.  Imaging: No results found.  Recent Labs: Lab Results  Component Value Date   WBC 7.6 10/23/2020   HGB  12.2 10/23/2020   PLT 405 (H) 10/23/2020   NA 141 10/23/2020   K 4.1 10/23/2020   CL 106 10/23/2020   CO2 28 10/23/2020   GLUCOSE 96 10/23/2020   BUN 10 10/23/2020   CREATININE 0.72 10/23/2020   BILITOT 0.6 10/23/2020   ALKPHOS 99 10/23/2020   AST 21 10/23/2020   ALT 29 10/23/2020   PROT 6.9 10/23/2020   ALBUMIN 3.4 (L) 10/23/2020   CALCIUM 9.1 10/23/2020   GFRAA 103 10/08/2020   QFTBGOLDPLUS NEGATIVE 05/01/2020    Speciality Comments: Cosentyx start date:04/2018  Procedures:  No procedures performed Allergies: Aspirin and Tylenol [acetaminophen]   Assessment / Plan:     Visit Diagnoses: Psoriatic arthritis ([place]): No synovitis or dactylitis was noted on examination today.  She has tenderness over the right first and third MCP joints and right first PIP joint.  She has been experiencing increased pain and stiffness in both hands and both wrist joints.  No inflammation was noted on examination today.  No evidence of Achilles tendinitis or plantar fasciitis.  She has no active psoriasis currently.  She is prescribed Cosentyx 150 mg subcutaneous injections every 14 days.  She was advised to discontinue methotrexate in September 2022 due to elevated LFTs.  Since then she has noticed more frequent and severe flares. Her most recent injection was administered on 05/22/2021.  She was admitted to the hospital on 05/19/2021 after being diagnosed with acute diverticulitis of the sigmoid colon with an abscess.  She was initially started on IV Cipro and Flagyl and was then transitioned to oral Cipro and Flagyl upon discharge.  She administered Cosentyx on the day that she was discharged.  Discussed that Cosentyx is an immunosuppressive agent and increases her risk for infections as well as making it more difficult to recover from infections. Discussed that ideally she should have had clearance from gastroenterology prior to resuming Cosentyx. Discussed options for psoriatic arthritis has not been well  controlled on Cosentyx as monotherapy we can discuss other treatment options.  She is not a good candidate for methotrexate due to elevated LFTs. Discussed treatment options with Dr. Estanislado Pandy and she recommended the use of otezla.  The indications, contraindications, potential side effects of Rutherford Nail were discussed today in detail.  All questions were addressed and consent was obtained.  We will apply for Rutherford Nail through her insurance and once approved a prescription will be sent to the pharmacy. She will follow-up  in the office in 6 to 8 weeks to assess her response.  Counseled patient that Rutherford Nail is a PDE 4 inhibitor that works to treat psoriasis and the joint pain and tenderness of psoriatic arthritis.  Counseled patient on purpose, proper use, and adverse effects of Otezla.  Reviewed the most common adverse effects of weight loss, depression, nausea/diarrhea/vomiting, headaches, and nasal congestion.  Advised patient to notify office of any serious changes in mood and/or thoughts of suicide.  Provided patient with medication education material and answered all questions.  Patient consented to [place].    Patient dose will be Otezla starter titration pack and then 30 mg twice daily.  Prescription pending insurance approval and once approved patient may pick up sample for starter pack from our office.  Psoriasis: She has occasional scattered patches of psoriasis.  No patches were evident on examination today.  High risk medication use - Applying for Otezla through her insurance.  Advised to discontinue Cosentyx 150 mg sq injections once every 14 days.  Discussed the risk of immunosuppression with her recent history of diverticulitis with an abscess.  Her last dose of cosentyx was on 05/22/21.   BMP and CBC 05/21/21.  CMP drawn on 05/18/21.  Discussed that Rutherford Nail does not require frequent lab monitoring like Cosentyx but we still typically like to check CBC and CMP every 6 months.  Thrombocytosis: Platelet  count 379 on 05/21/2021.  Primary osteoarthritis of both hands: She has PIP and DIP thickening consistent with osteoarthritis of both hands.  She has been experiencing increased pain and stiffness in both hands and both wrist joints.  No obvious synovitis or dactylitis was noted on examination.  Discussed the importance of joint protection and muscle strengthening.  Ulnar neuropathy of both upper extremities  S/P total knee replacement, left: Doing well.  She has good range of motion of the left knee replacement with warmth but no effusion.  Primary osteoarthritis of right knee: Painful range of motion of the right knee joint on examination today.  No warmth or effusion was noted.  Primary osteoarthritis of both feet: She continues to experience intermittent pain and stiffness in both feet.  Both ankle joints have good range of motion with no tenderness or joint swelling.  DDD (degenerative disc disease), lumbar: She continues to experience intermittent discomfort in her lower back.  She has difficulty standing or walking prolonged distances.  No symptoms of radiculopathy at this time.  Chronic anticoagulation - On Eliquis. Discontinued aspirin. Hx of PE December 2022.  History of pulmonary embolism - Unprovoked bilateral PE-dx 04/18/21-CT angiogram revealed bilateral subsegmental PEs.  Started on eliquis-lifelong.  D/c ASA on 05/06/21. She has not experienced any shortness of breath or pleuritic chest pain at this time.  Diverticulitis of large intestine with abscess without bleeding - Dx in the ED on 05/18/21: Acute sigmoid diverticulitis with abscess 2.8 cm.  IV antibiotic therapy: Cipro and flagyl.  Discharged on oral cipro and flagyl x2 wks.   Her last dose of Cosentyx was on 05/22/2021 which she took the day she was discharged from the hospital.  Discussed that anytime she develops signs or symptoms of an infection the recommendation is to hold Cosentyx until the infection has completely  cleared.  She was advised to discontinue Cosentyx.  Other medical conditions are listed as follows:  History of gastric bypass  Nocturnal muscle cramps  Orders: No orders of the defined types were placed in this encounter.  No orders of the defined types were  placed in this encounter.    Follow-Up Instructions: Return in 2 months (on 07/26/2021) for Psoriatic arthritis, Osteoarthritis, DDD.   Ofilia Neas, PA-C  Note - This record has been created using Dragon software.  Chart creation errors have been sought, but may not always  have been located. Such creation errors do not reflect on  the standard of medical care.

## 2021-05-28 ENCOUNTER — Telehealth: Payer: Self-pay

## 2021-05-28 ENCOUNTER — Encounter: Payer: Self-pay | Admitting: Physician Assistant

## 2021-05-28 ENCOUNTER — Other Ambulatory Visit: Payer: Self-pay

## 2021-05-28 ENCOUNTER — Ambulatory Visit (INDEPENDENT_AMBULATORY_CARE_PROVIDER_SITE_OTHER): Payer: Medicare Other | Admitting: Physician Assistant

## 2021-05-28 VITALS — BP 131/66 | HR 71 | Resp 13 | Ht 65.0 in | Wt 270.0 lb

## 2021-05-28 DIAGNOSIS — L4050 Arthropathic psoriasis, unspecified: Secondary | ICD-10-CM | POA: Diagnosis not present

## 2021-05-28 DIAGNOSIS — Z9884 Bariatric surgery status: Secondary | ICD-10-CM

## 2021-05-28 DIAGNOSIS — K5720 Diverticulitis of large intestine with perforation and abscess without bleeding: Secondary | ICD-10-CM | POA: Insufficient documentation

## 2021-05-28 DIAGNOSIS — M19041 Primary osteoarthritis, right hand: Secondary | ICD-10-CM

## 2021-05-28 DIAGNOSIS — L409 Psoriasis, unspecified: Secondary | ICD-10-CM | POA: Diagnosis not present

## 2021-05-28 DIAGNOSIS — D75839 Thrombocytosis, unspecified: Secondary | ICD-10-CM | POA: Diagnosis not present

## 2021-05-28 DIAGNOSIS — R252 Cramp and spasm: Secondary | ICD-10-CM

## 2021-05-28 DIAGNOSIS — Z7901 Long term (current) use of anticoagulants: Secondary | ICD-10-CM | POA: Insufficient documentation

## 2021-05-28 DIAGNOSIS — Z79899 Other long term (current) drug therapy: Secondary | ICD-10-CM | POA: Diagnosis not present

## 2021-05-28 DIAGNOSIS — M19071 Primary osteoarthritis, right ankle and foot: Secondary | ICD-10-CM

## 2021-05-28 DIAGNOSIS — M5136 Other intervertebral disc degeneration, lumbar region: Secondary | ICD-10-CM

## 2021-05-28 DIAGNOSIS — M1711 Unilateral primary osteoarthritis, right knee: Secondary | ICD-10-CM

## 2021-05-28 DIAGNOSIS — Z96652 Presence of left artificial knee joint: Secondary | ICD-10-CM

## 2021-05-28 DIAGNOSIS — M19072 Primary osteoarthritis, left ankle and foot: Secondary | ICD-10-CM

## 2021-05-28 DIAGNOSIS — Z86711 Personal history of pulmonary embolism: Secondary | ICD-10-CM

## 2021-05-28 DIAGNOSIS — M19042 Primary osteoarthritis, left hand: Secondary | ICD-10-CM

## 2021-05-28 DIAGNOSIS — G5623 Lesion of ulnar nerve, bilateral upper limbs: Secondary | ICD-10-CM

## 2021-05-28 NOTE — Telephone Encounter (Signed)
Please apply for otezla per Hazel Sams, PA-C. Thanks!   Consent obtained and sent to the scan center.

## 2021-05-28 NOTE — Patient Instructions (Signed)
Apremilast oral tablets What is this medication? APREMILAST (a PRE mil ast) is used to treat plaque psoriasis, psoriatic arthritis, and certain oral ulcers. This medicine may be used for other purposes; ask your health care provider or pharmacist if you have questions. COMMON BRAND NAME(S): Rutherford Nail What should I tell my care team before I take this medication? They need to know if you have any of these conditions: dehydration kidney disease mental illness an unusual or allergic reaction to apremilast, other medicines, foods, dyes, or preservatives pregnant or trying to get pregnant breast-feeding How should I use this medication? Take this medicine by mouth with a glass of water. Follow the directions on the prescription label. Do not cut, crush or chew this medicine. You can take it with or without food. If it upsets your stomach, take it with food. Take your medicine at regular intervals. Do not take it more often than directed. Do not stop taking except on your doctor's advice. Talk to your pediatrician regarding the use of this medicine in children. Special care may be needed. Overdosage: If you think you have taken too much of this medicine contact a poison control center or emergency room at once. NOTE: This medicine is only for you. Do not share this medicine with others. What if I miss a dose? If you miss a dose, take it as soon as you can. If it is almost time for your next dose, take only that dose. Do not take double or extra doses. What may interact with this medication? certain medicines for seizures like carbamazepine, phenobarbital, phenytoin rifampin This list may not describe all possible interactions. Give your health care provider a list of all the medicines, herbs, non-prescription drugs, or dietary supplements you use. Also tell them if you smoke, drink alcohol, or use illegal drugs. Some items may interact with your medicine. What should I watch for while using this  medication? Tell your doctor or healthcare professional if your symptoms do not start to get better or if they get worse. Patients and their families should watch out for new or worsening depression or thoughts of suicide. Also watch out for sudden changes in feelings such as feeling anxious, agitated, panicky, irritable, hostile, aggressive, impulsive, severely restless, overly excited and hyperactive, or not being able to sleep. If this happens, call your health care professional. Check with your doctor or health care professional if you get an attack of severe diarrhea, nausea and vomiting, or if you sweat a lot. The loss of too much body fluid can make it dangerous for you to take this medicine. What side effects may I notice from receiving this medication? Side effects that you should report to your care team as soon as possible: Allergic reactions or angioedema--skin rash, itching, hives, swelling of the face, eyes, lips, tongue, arms, or legs, trouble swallowing or breathing Depressed mood Weight loss Side effects that usually do not require medical attention (report to your care team if they continue or are bothersome): Diarrhea Headache Nausea Vomiting This list may not describe all possible side effects. Call your doctor for medical advice about side effects. You may report side effects to FDA at 1-800-FDA-1088. Where should I keep my medication? Keep out of the reach of children. Store below 30 degrees C (86 degrees F). Throw away any unused medicine after the expiration date. NOTE: This sheet is a summary. It may not cover all possible information. If you have questions about this medicine, talk to your doctor, pharmacist, or  health care provider.  2022 Elsevier/Gold Standard (2020-07-29 00:00:00)

## 2021-05-29 ENCOUNTER — Other Ambulatory Visit (HOSPITAL_COMMUNITY): Payer: Self-pay

## 2021-05-29 MED ORDER — APREMILAST 30 MG PO TABS
30.0000 mg | ORAL_TABLET | Freq: Two times a day (BID) | ORAL | 4 refills | Status: DC
  Filled 2021-05-29 – 2021-06-24 (×2): qty 60, 30d supply, fill #0
  Filled 2021-07-18: qty 60, 30d supply, fill #1
  Filled 2021-08-11: qty 60, 30d supply, fill #2
  Filled 2021-09-04: qty 60, 30d supply, fill #3
  Filled 2021-10-01: qty 60, 30d supply, fill #4

## 2021-05-29 MED ORDER — APREMILAST 10 & 20 & 30 MG PO TBPK
ORAL_TABLET | ORAL | 0 refills | Status: DC
  Filled 2021-05-29: qty 55, 30d supply, fill #0
  Filled 2021-05-29: qty 55, fill #0

## 2021-05-29 NOTE — Telephone Encounter (Addendum)
Received notification from [facility] regarding a prior authorization for [place]. Authorization has been APPROVED from 05/29/21 to 11/26/21.   Per test claim, copay for 28 days supply is $0  Patient can fill through [place]: [phone] . Patient will discontinue Cosentyx.  Authorization #  [id number] Phone # [phone]  Last dose of Cosentyx was 05/22/21 but can start Rutherford Nail as soon as received from pharmacy.  Cosentyx discontinued at [employer] today via phone - spoke with Anne Fu (pharmacy care coordinator).  Cosentyx discontinued from patient's medication list today as well.  Rx sent to [facility] for Otezla starter pack and maintenance dose. Day 1: 10 mg in the morning. Day 2: 10 mg twice daily; Day 3: 10 mg in AM and 20 mg in PM; Day 4: 20 mg twice daily; Day 5: 20 mg in AM and 30 mg in PM. Day 6 and onwards: 30 mg twice daily.  Called patient to notify. She is okay with filling with [facility] moving forward and is aware she will receive shipment at some point next week. Confirmed address on file is correct mailing address. Patient provided with pharmacy phone number to save for upcoming fill.  Knox Saliva, PharmD, MPH, BCPS Clinical Pharmacist (Rheumatology and Pulmonology)

## 2021-05-29 NOTE — Telephone Encounter (Signed)
Delivery instructions have been updated in [place], medication will be shipped to patient's home address by 06/02/21.  Rx has been processed in [facility] and the patient has no copay at this time.

## 2021-05-29 NOTE — Telephone Encounter (Addendum)
Submitted a Prior Authorization request to [facility] for OTEZLA via CoverMyMeds. Will update once we receive a response.  Auth submitted for 28 day starter pack (#55 tab for first 28 days)  Key: [id number]  Knox Saliva, PharmD, MPH, BCPS Clinical Pharmacist (Rheumatology and Pulmonology)

## 2021-05-30 ENCOUNTER — Other Ambulatory Visit (HOSPITAL_COMMUNITY): Payer: Self-pay

## 2021-06-06 ENCOUNTER — Ambulatory Visit (INDEPENDENT_AMBULATORY_CARE_PROVIDER_SITE_OTHER): Payer: Medicare Other | Admitting: Neurology

## 2021-06-06 ENCOUNTER — Encounter: Payer: Self-pay | Admitting: Neurology

## 2021-06-06 ENCOUNTER — Other Ambulatory Visit: Payer: Self-pay

## 2021-06-06 VITALS — BP 150/76 | HR 91 | Ht 65.0 in | Wt 269.0 lb

## 2021-06-06 DIAGNOSIS — G43109 Migraine with aura, not intractable, without status migrainosus: Secondary | ICD-10-CM

## 2021-06-06 DIAGNOSIS — G5623 Lesion of ulnar nerve, bilateral upper limbs: Secondary | ICD-10-CM | POA: Diagnosis not present

## 2021-06-06 MED ORDER — GABAPENTIN 300 MG PO CAPS
300.0000 mg | ORAL_CAPSULE | Freq: Three times a day (TID) | ORAL | 3 refills | Status: DC
Start: 2021-06-06 — End: 2022-08-12

## 2021-06-06 NOTE — Patient Instructions (Addendum)
We will refer you to [facility] to discuss surgical options for ulnar neuropathy  Return to clinic in 1 year

## 2021-06-06 NOTE — Progress Notes (Signed)
Follow-up Visit   Date: 06/06/21   Connie Hoffman MRN: [id number] DOB: December 29, 1960   Interim History: Connie Hoffman is a 61 y.o. 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  The patient was accompanied to the clinic by self.  History of present illness: Starting early 2021, she began noticing numbness in both hands, with occasional tingling. Symptoms are noticeable when she wakes up or when she is resting.  She endorses weakness and difficulty opening jars/bottle.     She has tingling and cramping of the legs for the past 1-2 years and takes gabapentin 100mg  twice daily. She had tingling up to her knees, which is present at nighttime.  She was diagnosed with neuropathy in 2018 by [facility] while living in [place].  This was ordered by her pain management provider, who she was seeing for lumbar canal stenosis. She underwent lumbar surgery which has significantly helped her low back pain and tingling in her legs.  She continues to get muscle cramps at night time. She takes methocarbamol and tramadol for pain.     She takes methotrexate and Cosentyx for psoriatic arthritis.   UPDATE 06/26/2020:  She is here for follow-up and reports worsening numbness/tingling of the hands, despite using soft elbow pad and being careful about how she positions the arms.  She wakes up most morning with her hands falling asleep and finds herself often shaking them throughout the day to get relief.  Symptoms are not constant, but recur.  She also some weakness in the hands, especially with opening jars/bottles.    UPDATE 09/19/2020:  Over the past 6 months, she has developed piercing headaches, lasting any where from 10-45 minutes, which occurs daily. It is usually at the base of her head and behind the right ear.   It is worse with movement and in the evening.  She prefers quiet,  dark room.  She has some relief with ice application to her neck.  She endorses photophobia, phonophobia, and mild dizziness.  She does not treat the headaches.  Headache does not wake her up from sleeping.  No worsening with coughing/sneezing.  No prior history of headaches.   The numbness and tingling in the hands is unchanged.  She has not started occupational therapy.   UPDATE 01/20/2021:  She is here for follow-up.  She reports worsening bilateral hand numbness and notices that within 5 minutes of holding her phone, her hand will falls asleep.  She is having harder time trying to get comfortable at night time, also.    Her headaches continue to be bothersome, it occurs about 4 days per week and lasts 3-4 hours.  She has associated nausea, photophobia.  She sometimes feels like she is in a fog when she wakes up.  She started gabapentin 300mg  at bedtime, but has not noticed any improvement.  UPDATE 06/06/2021: He is here for follow-up visit.  At her last visit, she reported worsening headaches so MRI brain was obtained which showed partially empty sella.  Given her associated headaches, she underwent large-volume lumbar puncture which showed normal opening pressure, excluding pseudotumor cerebri.  Overall, her headaches have improved and rarely gets migraines after increasing gabapentin to 300mg  three times.   Her bigger concern is ongoing paresthesias of her hands, worse on the right. It wakes her up from sleeping eventhough she is mindful of hand and arm positioning. She  does not report weakness.   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  She was also hospitalized in January 2023 with diverticulitis of the large intestines which was treated with antibiotic therapy.  Medications:  Current Outpatient Medications on File Prior to Visit  Medication Sig Dispense Refill   Accu-Chek Softclix Lancets lancets daily.     apixaban (ELIQUIS)  5 MG TABS tablet Take by mouth.     Apremilast 10 & 20 & 30 MG TBPK Day 1: 10 mg in the morning. Day 2: 10 mg twice daily; Day 3: 10 mg in AM and 20 mg in PM; Day 4: 20 mg twice daily; Day 5: 20 mg in AM and 30 mg in PM. Day 6 and onwards: 30 mg twice daily 55 each 0   [START ON 06/26/2021] Apremilast 30 MG TABS Take 1 tablet (30 mg total) by mouth in the morning and at bedtime. 60 tablet 4   atorvastatin (LIPITOR) 80 MG tablet Take 80 mg by mouth daily.     Blood Glucose Monitoring Suppl (ACCU-CHEK GUIDE) w/Device KIT TO BE USED TO CHECK BLOOD SUGAR DAILY AS DIRECTED.     ciprofloxacin (CIPRO) 500 MG tablet SMARTSIG:1 Tablet(s) By Mouth Every 12 Hours     DULoxetine (CYMBALTA) 30 MG capsule Take 30 mg by mouth at bedtime.     folic acid (FOLVITE) 1 MG tablet Take 1-2 mg by mouth daily.     glucose blood test strip TO BE USED TO CHECK BLOOD SUGAR DAILY AS DIRECTED. DX:E16.2     isosorbide mononitrate (IMDUR) 30 MG 24 hr tablet Take 30 mg by mouth daily.     ketoconazole (NIZORAL) 2 % cream Apply 1 application topically daily. 15 g 0   Menthol, Topical Analgesic, (BIOFREEZE) 4 % GEL Apply 1 application topically See admin instructions. Apply to painful sites daily as directed     methocarbamol (ROBAXIN) 500 MG tablet Take 500-1,000 mg by mouth 2 (two) times daily as needed for muscle spasms.     Methotrexate 25 MG/ML SOSY Inject 17.5 mg into the skin every Wednesday.     methotrexate 50 MG/2ML injection INJECT 0.7ML INTO THE SKIN ONCE A WEEK 10 mL 0   metoprolol succinate (TOPROL-XL) 25 MG 24 hr tablet Take 25 mg by mouth daily.     metroNIDAZOLE (FLAGYL) 500 MG tablet Take 500 mg by mouth every 8 (eight) hours.     Multiple Vitamin (MULTIVITAMIN WITH MINERALS) TABS tablet Take 1 tablet by mouth daily.     nitroGLYCERIN (NITROSTAT) 0.4 MG SL tablet Place under the tongue.     ondansetron (ZOFRAN-ODT) 4 MG disintegrating tablet Take 4 mg by mouth every 8 (eight) hours as needed.     OZEMPIC, 0.25 OR  0.5 MG/DOSE, 2 MG/1.5ML SOPN Inject 2 mg into the skin every Saturday.     pantoprazole (PROTONIX) 40 MG tablet Take 40 mg by mouth daily.     predniSONE (DELTASONE) 5 MG tablet Take 4 tablets by mouth daily x2 days, 3 tablets daily x2 days, 2 tablets by mouth daily x2 days, 1 tablet by mouth daily x2 days. 20 tablet 0   triamcinolone ointment (KENALOG) 0.1 % Apply 1 application topically 2 (two) times daily as needed (to affected sites- for itching).  0   TUBERCULIN SYR 1CC/27GX1/2" 27G X 1/2" 1 ML MISC Use 1 syringe once weekly to inject methotrexate. 12 each 2   No current  facility-administered medications on file prior to visit.    Allergies:  Allergies  Allergen Reactions   Aspirin Other (See Comments)    GI bleeding   Tylenol [Acetaminophen] Other (See Comments)    Raised patient's liver enzymes    Vital Signs:  BP (!) 150/76    Pulse 91    Ht 5\' 5"  (1.651 m)    Wt 269 lb (122 kg)    SpO2 95%    BMI 44.76 kg/m    Neurological Exam: MENTAL STATUS including orientation to time, place, person, recent and remote memory, attention span and concentration, language, and fund of knowledge is normal.  Speech is not dysarthric.  CRANIAL NERVES:  Normal conjugate, extra-ocular eye movements in all directions of gaze.  No ptosis. Face is symmetric. Tongue is midline.   MOTOR:  Motor strength is 5/5 in all extremities, except bilateral finger abductors are 4/5.  No atrophy, fasciculations or abnormal movements.  No pronator drift.  Tone is normal.    MSRs:  Reflexes are 2+/4 throughout.  SENSORY:  Intact to vibration and temperature throughout.  COORDINATION/GAIT:  Gait is mildly wide-based due to body habitus, stable unassisted   Data: NCS/EMG of the arms 05/08/2020: Bilateral ulnar neuropathy with slowing across the elbow, predominantly demyelinating, mild-to-moderate and worse on the right. There is no evidence of a cervical radiculopathy or carpal tunnel syndrome affecting either  upper extremity  MRI brain wo contrast 02/20/2021: Partially empty and slightly expanded sella turcica. While these findings can reflect incidental anatomic variation, they can also be associated with idiopathic intracranial hypertension (pseudotumor cerebri). Mild generalized cerebellar atrophy. Otherwise unremarkable non-contrast MRI appearance of the brain for age. Mild mucosal thickening within the left maxillary sinus.  Large volume LP 03/04/2021: OP 15  IMPRESSION/PLAN:  Bilateral cubital tunnel syndrome, worse on the right.  No benefit with conservative therapies  - Refer to hand orthopaedics for consideration of surgical decompression  Atypical migraine, improved.  - Work-up has been extensive and included MRI brain which showed partially empty sella, so she also underwent LP which had normal opening pressure, eliminating psuedotumor cerebri  - Continue gabapentin 300mg  TID  Return to clinic in 1 year  Thank you for allowing me to participate in patient's care.  If I can answer any additional questions, I would be pleased to do so.    Sincerely,    Rebel Laughridge K. Allena Katz, DO

## 2021-06-24 ENCOUNTER — Other Ambulatory Visit (HOSPITAL_COMMUNITY): Payer: Self-pay

## 2021-07-14 NOTE — Progress Notes (Signed)
Office Visit Note  Patient: Connie Hoffman             Date of Birth: 1960/11/30           MRN: [id number]             PCP: Kerin Salen, PA-C Referring: Kathaleen Bury* Visit Date: 07/28/2021 Occupation: @GUAROCC @  Subjective:  Medication monitoring   History of Present Illness: Connie Hoffman is a 61 y.o. female with history of psoriatic arthritis, osteoarthritis, DDD.  Patient is currently taking Otezla 30 mg 1 tablet by mouth twice daily as prescribed.  She initiated the starter pack after her last visit on 05/28/21.  She has been experiencing diarrhea 1-2 times daily since initiating Otezla.  She denies any other side effects and states that overall the medication has been tolerable.  She continues to experience pain in both hands especially her right hand and wrist.  She states that she has swelling in both hands first thing in the morning. She has upcoming surgery on Thursday for middle trigger finger release, right carpal tunnel release, and ulnar nerve entrapment release with Dr. Madelon Lips.  She denies any SI joint discomfort at this time.  She has been experiencing increased pain in the right foot due to right Achilles tendinitis.  She denies any plantar fasciitis.  She had Visco gel injections in the right knee joint in March 2023 performed by her orthopedist.   Overall she has noticed a 40 to 50% improvement since initiating Otezla. She denies any recent infections.    Activities of Daily Living:  Patient reports morning stiffness for 45 minutes  Patient Denies nocturnal pain.  Difficulty dressing/grooming: Denies Difficulty climbing stairs: Reports Difficulty getting out of chair: Reports Difficulty using hands for taps, buttons, cutlery, and/or writing: Reports  Review of Systems  Constitutional:  Positive for fatigue.  HENT:  Negative for mouth sores, mouth dryness and nose dryness.   Eyes:  Positive for dryness. Negative for pain and visual  disturbance.  Respiratory:  Negative for cough, hemoptysis, shortness of breath and difficulty breathing.   Cardiovascular:  Negative for chest pain, palpitations, hypertension and swelling in legs/feet.  Gastrointestinal:  Positive for diarrhea. Negative for blood in stool and constipation.  Endocrine: Negative for increased urination.  Genitourinary:  Negative for painful urination.  Musculoskeletal:  Positive for joint pain, joint pain, joint swelling and morning stiffness. Negative for myalgias, muscle weakness, muscle tenderness and myalgias.  Skin:  Negative for color change, pallor, rash, hair loss, nodules/bumps, skin tightness, ulcers and sensitivity to sunlight.  Allergic/Immunologic: Negative for susceptible to infections.  Neurological:  Negative for dizziness, numbness, headaches and weakness.  Hematological:  Negative for swollen glands.  Psychiatric/Behavioral:  Negative for depressed mood and sleep disturbance. The patient is not nervous/anxious.    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   Thrombocytosis 12/01/2017   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    Past Medical History:  Diagnosis Date   Chronic stable  angina (HCC)    DDD (degenerative disc disease), lumbar    Intestinal abscess    Psoriatic arthritis (HCC)    Pulmonary embolism (HCC)    Bilateral lungs   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CC)    Spinal stenosis     Family History   Problem Relation Age of Onset   Diabetes Mother    Hypertension Mother    Heart Problems Mother    Congestive Heart Failure Mother    Diabetes Father    Hypertension Father    Stroke Sister    Heart Problems Sister    Seizures Sister    Prostate cancer Brother    Diabetes Brother    Asthma Daughter    Healthy Daughter    Healthy Son    Healthy Son    Past Surgical History:  Procedure Laterality Date   BACK SURGERY  05/18/2019   L4 and L5   CHOLECYSTECTOMY     CYSTECTOMY  07/2017   JOINT REPLACEMENT Left    left knee   LAPAROSCOPIC ENDOMETRIOSIS FULGURATION  2011   LAPAROSCOPIC GASTRIC SLEEVE RESECTION  2018   LAPAROSCOPIC OOPHERECTOMY Right    LUMBAR EPIDURAL INJECTION  04/13/2018   REVISION TOTAL KNEE ARTHROPLASTY Left 12/2017   TOTAL SHOULDER ARTHROPLASTY Bilateral    TUBAL LIGATION     postpartum   Social History   Social History Narrative   Right handed    Lives with husband and 1 daughters    Immunization History  Administered Date(s) Administered   Influenza,inj,Quad PF,6+ Mos 04/15/2018   PFIZER(Purple Top)SARS-COV-2 Vaccination 07/15/2019, 08/05/2019, 03/13/2020   Pneumococcal Polysaccharide-23 04/15/2018   Zoster Recombinat (Shingrix) 05/13/2018, 10/25/2019     Objective: Vital Signs: BP 121/64 (BP Location: Right Arm, Patient Position: Sitting, Cuff Size: Large)   Pulse 66   Resp 12   Ht 5\' 5"  (1.651 m)   Wt 270 lb 3.2 oz (122.6 kg)   BMI 44.96 kg/m    Physical Exam Vitals and nursing note reviewed.  Constitutional:      Appearance: She is well-developed.  HENT:     Head: Normocephalic and atraumatic.  Eyes:     Conjunctiva/sclera: Conjunctivae normal.  Cardiovascular:     Rate and Rhythm: Normal rate and regular rhythm.     Heart sounds: Normal heart sounds.  Pulmonary:     Effort: Pulmonary effort is normal.     Breath sounds: Normal breath sounds.  Abdominal:     General: Bowel sounds are normal.     Palpations: Abdomen is soft.   Musculoskeletal:     Cervical back: Normal range of motion.  Skin:    General: Skin is warm and dry.     Capillary Refill: Capillary refill takes less than 2 seconds.  Neurological:     Mental Status: She is alert and oriented to person, place, and time.  Psychiatric:        Behavior: Behavior normal.     Musculoskeletal Exam: C-spine, thoracic spine, lumbar spine have good range of motion.  No midline spinal tenderness or SI joint tenderness.  Painful range of motion of the right shoulder joint.  Elbow joints have good range of motion with tenderness bilaterally, right greater than left.  Tenderness over the right wrist joint but no synovitis was noted.  She has tenderness over the right first and third MCPs and right fourth PIP joint.  Complete fist formation bilaterally.  Right middle trigger finger noted.  Hip joints have good range of motion with  no groin pain.  Right knee joint has good range of motion with no warmth or effusion.  Left knee replacement is slightly limited extension with warmth.  Ankle joints have good range of motion. Tenderness along the right achilles tendon.   CDAI Exam: CDAI Score: -- Patient Global: --; Provider Global: -- Swollen: 0 ; Tender: 4  Joint Exam 07/28/2021      Right  Left  Wrist   Tender     MCP 1   Tender     MCP 3   Tender     PIP 4   Tender        Investigation: No additional findings.  Imaging: No results found.  Recent Labs: Lab Results  Component Value Date   WBC 7.6 10/23/2020   HGB 12.2 10/23/2020   PLT 405 (H) 10/23/2020   NA 141 10/23/2020   K 4.1 10/23/2020   CL 106 10/23/2020   CO2 28 10/23/2020   GLUCOSE 96 10/23/2020   BUN 10 10/23/2020   CREATININE 0.72 10/23/2020   BILITOT 0.6 10/23/2020   ALKPHOS 99 10/23/2020   AST 21 10/23/2020   ALT 29 10/23/2020   PROT 6.9 10/23/2020   ALBUMIN 3.4 (L) 10/23/2020   CALCIUM 9.1 10/23/2020   GFRAA 103 10/08/2020   QFTBGOLDPLUS NEGATIVE 05/01/2020    Speciality  Comments: Cosentyx start date:04/2018  Procedures:  No procedures performed Allergies: Aspirin and Tylenol [acetaminophen]   Assessment / Plan:     Visit Diagnoses: Psoriatic arthritis (HCC): She has no synovitis or dactylitis on examination today.  She has ongoing pain and stiffness in both hands and the right wrist joint.  Her morning stiffness has been lasting about 45 minutes daily and has noticed some swelling first thing in the morning.  Of note, she is scheduled to have right carpal tunnel release, right middle trigger finger release, and right ulnar nerve entrapment release on 07/31/2021 by Dr. Madelon Lips.  She is currently taking Otezla 30 mg 1 tablet by mouth twice daily.  She has been experiencing 1-2 loose stools per day since initiating therapy after her last office visit on 05/28/2021.  Overall [place] has been tolerable and she is willing to give it more time to see the full efficacy.  She has noticed about a 40 to 50% improvement in her joint pain and inflammation while taking Otezla.  She has not had any infections since switching from Cosentyx to [place]. Discussed that I would like to reassess how she is doing after she has recovered from surgery and while getting [place] more time before reassessing the full efficacy.  She will follow-up in the office in 2 to 3 months or sooner if needed.  Psoriasis: She has no active psoriasis at this time.  She will remain on Otezla as prescribed.  High risk medication use - Otezla 30 mg 1 tablet by mouth twice daily.  Manya Silvas after her last office visit on 05/28/2021.  She has not noticed any increased depression or suicidal ideation since initiating otezla.  Previous therapy includes: Cosentyx.   History of diverticulitis with sigmoid colon abscess.   She has not had any recent infections since discontinuing cosentyx.  CBC and CMP drawn on 06/20/2021.  Thrombocytosis: Platelet count WNL on 06/20/21.   Primary osteoarthritis of both hands: She  has PIP and DIP thickening consistent with osteoarthritis of both hands.  She has been experiencing ongoing pain in both hands.  No inflammation was noted on examination today.  She is  able to make a complete fist bilaterally.  Discussed the importance of joint protection and muscle strengthening.  Ulnar neuropathy of both upper extremities: She is undergoing surgical release for right ulnar nerve entrapment on Thursday, 07/31/2021 by Dr. Madelon Lips.  She will also be having right carpal tunnel release and right middle trigger finger release at that time.   S/P total knee replacement, left: Doing well.  She has slightly limited extension of the left knee replacement with warmth on examination.  No effusion noted.  Primary osteoarthritis of right knee: She has good range of motion of the right knee joint on examination today.  No warmth or effusion was noted.  She had Visco gel injections performed in the right knee by her orthopedist in March 2023.  She is started to notice improvement in her knee joint pain and stiffness.  Primary osteoarthritis of both feet: She is good range of motion of both ankle joints with no synovitis on examination.  She has tenderness palpation over the right Achilles tendon.  Discussed the importance of wearing proper fitting shoes.  DDD (degenerative disc disease), lumbar: She has no midline spinal tenderness at this time.  No symptoms of radiculopathy.  Other medical conditions are listed as follows:  Chronic anticoagulation - On Eliquis. Discontinued aspirin. Hx of PE December 2022.  History of pulmonary embolism  Diverticulitis of large intestine with abscess without bleeding  History of gastric bypass  Orders: No orders of the defined types were placed in this encounter.  No orders of the defined types were placed in this encounter.    Follow-Up Instructions: Return in about 3 months (around 10/27/2021) for Psoriatic arthritis, Osteoarthritis, DDD.   Gearldine Bienenstock, PA-C  Note - This record has been created using Dragon software.  Chart creation errors have been sought, but may not always  have been located. Such creation errors do not reflect on  the standard of medical care.

## 2021-07-15 ENCOUNTER — Other Ambulatory Visit: Payer: Self-pay | Admitting: Physician Assistant

## 2021-07-15 ENCOUNTER — Other Ambulatory Visit (HOSPITAL_COMMUNITY): Payer: Self-pay

## 2021-07-15 DIAGNOSIS — L4050 Arthropathic psoriasis, unspecified: Secondary | ICD-10-CM

## 2021-07-15 DIAGNOSIS — L409 Psoriasis, unspecified: Secondary | ICD-10-CM

## 2021-07-16 ENCOUNTER — Other Ambulatory Visit (HOSPITAL_COMMUNITY): Payer: Self-pay

## 2021-07-16 NOTE — Telephone Encounter (Signed)
Maintenance dose sent on 06/26/2021 ?

## 2021-07-17 ENCOUNTER — Other Ambulatory Visit (HOSPITAL_COMMUNITY): Payer: Self-pay

## 2021-07-18 ENCOUNTER — Other Ambulatory Visit (HOSPITAL_COMMUNITY): Payer: Self-pay

## 2021-07-22 ENCOUNTER — Other Ambulatory Visit (HOSPITAL_COMMUNITY): Payer: Self-pay

## 2021-07-28 ENCOUNTER — Encounter: Payer: Self-pay | Admitting: Physician Assistant

## 2021-07-28 ENCOUNTER — Ambulatory Visit (INDEPENDENT_AMBULATORY_CARE_PROVIDER_SITE_OTHER): Payer: Medicare Other | Admitting: Physician Assistant

## 2021-07-28 VITALS — BP 121/64 | HR 66 | Resp 12 | Ht 65.0 in | Wt 270.2 lb

## 2021-07-28 DIAGNOSIS — M19042 Primary osteoarthritis, left hand: Secondary | ICD-10-CM

## 2021-07-28 DIAGNOSIS — Z86711 Personal history of pulmonary embolism: Secondary | ICD-10-CM

## 2021-07-28 DIAGNOSIS — Z9884 Bariatric surgery status: Secondary | ICD-10-CM

## 2021-07-28 DIAGNOSIS — M1711 Unilateral primary osteoarthritis, right knee: Secondary | ICD-10-CM

## 2021-07-28 DIAGNOSIS — K5720 Diverticulitis of large intestine with perforation and abscess without bleeding: Secondary | ICD-10-CM

## 2021-07-28 DIAGNOSIS — M19041 Primary osteoarthritis, right hand: Secondary | ICD-10-CM

## 2021-07-28 DIAGNOSIS — M5136 Other intervertebral disc degeneration, lumbar region: Secondary | ICD-10-CM

## 2021-07-28 DIAGNOSIS — G5623 Lesion of ulnar nerve, bilateral upper limbs: Secondary | ICD-10-CM

## 2021-07-28 DIAGNOSIS — L409 Psoriasis, unspecified: Secondary | ICD-10-CM | POA: Diagnosis not present

## 2021-07-28 DIAGNOSIS — L4050 Arthropathic psoriasis, unspecified: Secondary | ICD-10-CM

## 2021-07-28 DIAGNOSIS — Z79899 Other long term (current) drug therapy: Secondary | ICD-10-CM

## 2021-07-28 DIAGNOSIS — D75839 Thrombocytosis, unspecified: Secondary | ICD-10-CM | POA: Diagnosis not present

## 2021-07-28 DIAGNOSIS — Z7901 Long term (current) use of anticoagulants: Secondary | ICD-10-CM

## 2021-07-28 DIAGNOSIS — M19072 Primary osteoarthritis, left ankle and foot: Secondary | ICD-10-CM

## 2021-07-28 DIAGNOSIS — Z96652 Presence of left artificial knee joint: Secondary | ICD-10-CM

## 2021-07-28 DIAGNOSIS — M19071 Primary osteoarthritis, right ankle and foot: Secondary | ICD-10-CM

## 2021-08-11 ENCOUNTER — Other Ambulatory Visit (HOSPITAL_COMMUNITY): Payer: Self-pay

## 2021-08-14 ENCOUNTER — Other Ambulatory Visit (HOSPITAL_COMMUNITY): Payer: Self-pay

## 2021-08-15 ENCOUNTER — Telehealth: Payer: Self-pay | Admitting: Neurology

## 2021-08-15 NOTE — Telephone Encounter (Signed)
Pt called in stating her headaches are back and are "mind numbing". She would like to know what she should do? ?

## 2021-08-15 NOTE — Telephone Encounter (Signed)
Patient called back

## 2021-08-15 NOTE — Telephone Encounter (Signed)
Called patient back and she has been taking Gabapentin for her headaches which help for a little then it stops working. Headaches have been going on for about 3 weeks. Patient states she has been shaking in her sleep and her husband has noticed this and states it has occurred several times in the past. Patients husband stated that he noticed patient was shaking violently last night and stopped when he grabbed her to wake her up. Patient also states that she has been getting confused for the past couple of days and today she is feeling tired.  ? ?Advised patient that I would send her message to the covering physicians for Dr. Posey Pronto and give her a call once I hear back.  ? ?

## 2021-08-15 NOTE — Telephone Encounter (Signed)
Called patient and informed her that Dr. Tomi Likens recommended she make an appointment to discuss the new issue of shaking in her sleep and to discuss her headaches. Patient verbalized understanding and is aware I will have someone call to schedule her a virtual appt with Dr. Tomi Likens.  ?

## 2021-08-15 NOTE — Telephone Encounter (Signed)
Called patient and left a message for a call back.  

## 2021-08-18 NOTE — Telephone Encounter (Signed)
Called and left patient a message to call back to scheduled a virtual visit with Dr. Tomi Likens. ?

## 2021-09-01 NOTE — Progress Notes (Signed)
? ?NEUROLOGY FOLLOW UP OFFICE NOTE ? ?Connie Hoffman ?[id number] ? ?Assessment/Plan:  ? ?Seizure-like activity during sleep ? ?Check routine EEG ?If unremarkable without capturing an event, will order 72 hour ambulatory EEG ?Further recommendations pending results. ? ?Subjective:  ?Connie Hoffman is a 61 year old right-handed female with psoriatic arthritis, GERD, and anxiety who is followed by Dr. 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 ? ?For the past 7 months, her husband has witnessed seizure-like activity in her sleep.  She will have twitching or jerking of the body.  Sometimes brief.  Sometimes more severe.  It can last 5 to 10 minutes.  During the episodes, her head may arch back and she may grunt.  No tongue biting or incontinence.  It doesn't wake her up.  When she wakes up later in the morning, she sometimes may feel confused for 30 minutes.  It occurs at least once a week but may occur 3-4 nights some weeks.  No twitching or jerking during the day.  No h/o head trauma, seizures or meningitis, FH:  sister currently started having "seizures"   ? ?To evaluate headaches, she had an MRI of the brain without contrast on 02/20/2021 which revealed mild generalized atrophy and partially empty sella but otherwise unremarkable.  She had a lumbar puncture with normal opening pressure and CSF analysis.  She was doing well on gabapentin 315m three times daily but over the past month have returned.   ? ? ?PAST MEDICAL HISTORY: ?Past Medical History:  ?Diagnosis Date  ? Chronic stable angina (HCC)   ? DDD (degenerative disc disease), lumbar   ? Intestinal abscess   ? [place]   ? [place]   ? Bilateral lungs  ? 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 Spinal stenosis   ? ? ?MEDICATIONS: ?Current Outpatient Medications on File Prior to Visit  ?Medication Sig Dispense Refill  ? Accu-Chek Softclix Lancets lancets daily.    ?  albuterol (VENTOLIN HFA) 108 (90 Base) MCG/ACT inhaler Inhale 2 puffs into the lungs every 6 (six) hours as needed.    ? apixaban (ELIQUIS) 5 MG TABS tablet Take by mouth.    ? Apremilast 10 & 20 & 30 MG TBPK Day 1: 10 mg in the morning. Day 2: 10 mg twice daily; Day 3: 10 mg in AM and 20 mg in PM; Day 4: 20 mg twice daily; Day 5: 20 mg in AM and 30 mg in PM. Day 6 and onwards: 30 mg twice daily (Patient not taking: Reported on 07/28/2021) 55 each 0  ? Apremilast 30 MG TABS Take 1 tablet (30 mg total) by mouth in the morning and at bedtime. 60 tablet 4  ? atorvastatin (LIPITOR) 80 MG tablet Take 80 mg by mouth daily.    ? Blood Glucose Monitoring Suppl (ACCU-CHEK GUIDE) w/Device KIT TO BE USED TO CHECK BLOOD SUGAR DAILY AS DIRECTED.    ? ciprofloxacin (CIPRO) 500 MG tablet SMARTSIG:1 Tablet(s) By Mouth Every 12 Hours (Patient not taking: Reported on 07/28/2021)    ? DULoxetine (CYMBALTA) 30 MG capsule Take 30 mg by mouth at bedtime.    ? folic acid (FOLVITE) 1 MG tablet Take 1-2 mg by mouth daily.    ? gabapentin (NEURONTIN) 300 MG capsule Take 1 capsule (300 mg total) by mouth 3 (three) times daily. 270 capsule 3  ? glucose blood test strip TO BE USED TO CHECK BLOOD SUGAR  DAILY AS DIRECTED. 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 isosorbide mononitrate (IMDUR) 30 MG 24 hr tablet Take 30 mg by mouth daily. (Patient not taking: Reported on 07/28/2021)    ? ketoconazole (NIZORAL) 2 % cream Apply 1 application topically daily. 15 g 0  ? Menthol, Topical Analgesic, (BIOFREEZE) 4 % GEL Apply 1 application topically See admin instructions. Apply to painful sites daily as directed    ? methocarbamol (ROBAXIN) 500 MG tablet Take 500-1,000 mg by mouth 2 (two) times daily as needed for muscle spasms. (Patient not taking: Reported on 07/28/2021)    ? Methotrexate 25 MG/ML SOSY Inject 17.5 mg into the skin every Wednesday. (Patient not taking: Reported on 07/28/2021)    ? methotrexate 50 MG/2ML injection INJECT 0.7ML INTO THE SKIN ONCE A WEEK (Patient not taking:  Reported on 07/28/2021) 10 mL 0  ? metoprolol succinate (TOPROL-XL) 25 MG 24 hr tablet Take 25 mg by mouth daily.    ? metroNIDAZOLE (FLAGYL) 500 MG tablet Take 500 mg by mouth every 8 (eight) hours. (Patient not taking: Reported on 07/28/2021)    ? Multiple Vitamin (MULTIVITAMIN WITH MINERALS) TABS tablet Take 1 tablet by mouth daily.    ? nitroGLYCERIN (NITROSTAT) 0.4 MG SL tablet Place under the tongue.    ? ondansetron (ZOFRAN-ODT) 4 MG disintegrating tablet Take 4 mg by mouth every 8 (eight) hours as needed.    ? OZEMPIC, 0.25 OR 0.5 MG/DOSE, 2 MG/1.5ML SOPN Inject 2 mg into the skin every Saturday. (Patient not taking: Reported on 07/28/2021)    ? pantoprazole (PROTONIX) 40 MG tablet Take 40 mg by mouth daily.    ? predniSONE (DELTASONE) 5 MG tablet Take 4 tablets by mouth daily x2 days, 3 tablets daily x2 days, 2 tablets by mouth daily x2 days, 1 tablet by mouth daily x2 days. (Patient not taking: Reported on 07/28/2021) 20 tablet 0  ? triamcinolone ointment (KENALOG) 0.1 % Apply 1 application topically 2 (two) times daily as needed (to affected sites- for itching). (Patient not taking: Reported on 07/28/2021)  0  ? TUBERCULIN SYR 1CC/27GX1/2" 27G X 1/2" 1 ML MISC Use 1 syringe once weekly to inject methotrexate. (Patient not taking: Reported on 07/28/2021) 12 each 2  ? ?No current facility-administered medications on file prior to visit.  ? ? ?ALLERGIES: ?Allergies  ?Allergen Reactions  ? Aspirin Other (See Comments)  ?  GI bleeding  ? Tylenol [Acetaminophen] Other (See Comments)  ?  Raised patient's liver enzymes  ? ? ?FAMILY HISTORY: ?Family History  ?Problem Relation Age of Onset  ? Diabetes Mother   ? Hypertension Mother   ? Heart Problems Mother   ? Congestive Heart Failure Mother   ? Diabetes Father   ? Hypertension Father   ? Stroke Sister   ? Heart Problems Sister   ? Seizures Sister   ? Prostate cancer Brother   ? Diabetes Brother   ? Asthma Daughter   ? Healthy Daughter   ? Healthy Son   ? Healthy Son   ? ? ?   ?Objective:  ?Blood pressure (!) 144/86, pulse 82, height 5' 5"  (1.651 m), weight 267 lb (121.1 kg), SpO2 99 %. ?General: No acute distress.  Patient appears well-groomed.   ?Head:  Normocephalic/atraumatic ?Eyes:  Fundi examined but not visualized ?Neck: supple, no paraspinal tenderness, full range of motion ?Heart:  Regular rate and rhythm ?Neurological Exam: alert and oriented to person, place, and time.  Speech fluent and not dysarthric, language intact.  CN  II-XII intact. Bulk and tone normal, muscle strength 5/5 throughout.  Sensation to light touch intact.  Deep tendon reflexes 2+ throughout, toes downgoing.  Finger to nose testing intact.  Wearing boot.  Gait steady.   ? ? ?Metta Clines, DO ? ?CC: Jenel Lucks, PA-C ? ? ? ? ? ? ?

## 2021-09-02 ENCOUNTER — Encounter: Payer: Self-pay | Admitting: Neurology

## 2021-09-02 ENCOUNTER — Ambulatory Visit (INDEPENDENT_AMBULATORY_CARE_PROVIDER_SITE_OTHER): Payer: Medicare Other | Admitting: Neurology

## 2021-09-02 VITALS — BP 144/86 | HR 82 | Ht 65.0 in | Wt 267.0 lb

## 2021-09-02 DIAGNOSIS — R569 Unspecified convulsions: Secondary | ICD-10-CM | POA: Diagnosis not present

## 2021-09-02 NOTE — Patient Instructions (Signed)
Check routine EEG.  If normal, will then check a 72 hour ambulatory EEG.  Further recommendations pending results. ?

## 2021-09-04 ENCOUNTER — Encounter: Payer: Self-pay | Admitting: Neurology

## 2021-09-04 ENCOUNTER — Other Ambulatory Visit (HOSPITAL_COMMUNITY): Payer: Self-pay

## 2021-09-04 ENCOUNTER — Other Ambulatory Visit: Payer: Medicare Other

## 2021-09-10 ENCOUNTER — Ambulatory Visit (INDEPENDENT_AMBULATORY_CARE_PROVIDER_SITE_OTHER): Payer: Medicare Other | Admitting: Neurology

## 2021-09-10 ENCOUNTER — Encounter: Payer: Self-pay | Admitting: Neurology

## 2021-09-10 DIAGNOSIS — R569 Unspecified convulsions: Secondary | ICD-10-CM

## 2021-09-11 ENCOUNTER — Other Ambulatory Visit (HOSPITAL_COMMUNITY): Payer: Self-pay

## 2021-09-11 NOTE — Procedures (Signed)
ELECTROENCEPHALOGRAM REPORT  Date of Study: 09/10/2021  Patient's Name: Lisaann Atha MRN: [id number] Date of Birth: 03/22/1961   Clinical History: 61 year old female with migraines, psoriatic arthritis and history of pulmonary embolism who presents for evaluation of seizure-like activity during sleep.  Medications: CYMBALTA 30 MG capsule NEURONTIN 300 MG capsule VENTOLIN HFA 108 (90 Base) MCG/ACT inhaler ELIQUIS 5 MG TABS tablet Apremilast 30 MG TABS LIPITOR 80 MG tablet FOLVITE 1 MG tablet IMDUR 30 MG 24 hr tablet NIZORAL 2 % cream BIOFREEZE 4 % GEL TOPROL-XL 25 MG 24 hr tablet ULTIVITAMIN WITH MINERALS TABS tablet NITROSTAT 0.4 MG SL tablet PROTONIX 40 MG tablet  Technical Summary: A multichannel digital EEG recording measured by the international 10-20 system with electrodes applied with paste and impedances below 5000 ohms performed in our laboratory with EKG monitoring in an awake and drowsy patient.  Hyperventilation and photic stimulation were performed.  The digital EEG was referentially recorded, reformatted, and digitally filtered in a variety of bipolar and referential montages for optimal display.    Description: The patient is awake and drowsy during the recording.  During maximal wakefulness, there is a symmetric, low voltage 11-12 Hz posterior dominant rhythm that attenuates with eye opening.  The record is symmetric.  During drowsiness and sleep, there is an increase in theta slowing of the background.  Stage 2 sleep was not seen.  Hyperventilation and photic stimulation did not elicit any abnormalities.  There were no epileptiform discharges or electrographic seizures seen.    EKG lead was unremarkable.  Impression: This awake and drowsy EEG is normal.    Clinical Correlation: A normal EEG does not exclude a clinical diagnosis of epilepsy.  If further clinical questions remain, prolonged EEG may be helpful.  Clinical correlation is advised.   Metta Clines,  DO

## 2021-09-12 ENCOUNTER — Telehealth: Payer: Self-pay | Admitting: Neurology

## 2021-09-12 NOTE — Telephone Encounter (Signed)
EEG order sent to [facility]

## 2021-09-12 NOTE — Telephone Encounter (Signed)
Pt called informed EEG is normal 72 hour eeg ordered

## 2021-09-12 NOTE — Telephone Encounter (Signed)
Pt wants to set up 72 hour EEG Dr. Tomi Likens wanted her to have. Connie Hoffman stated it would need to be set up with [facility]

## 2021-09-12 NOTE — Telephone Encounter (Signed)
-----   Message from Pieter Partridge, DO sent at 09/11/2021  6:09 AM EDT ----- EEG is normal.  Would like to order 72 hour ambulatory EEG

## 2021-09-16 ENCOUNTER — Telehealth: Payer: Self-pay | Admitting: Neurology

## 2021-09-16 NOTE — Telephone Encounter (Signed)
Patient is calling to follow up regarding the 72hr EEG.

## 2021-09-16 NOTE — Telephone Encounter (Signed)
Referral for 72 hour EEG sent to [facility] and they should give her a call.

## 2021-09-24 ENCOUNTER — Ambulatory Visit (INDEPENDENT_AMBULATORY_CARE_PROVIDER_SITE_OTHER): Payer: Medicare Other | Admitting: Neurology

## 2021-09-24 DIAGNOSIS — R569 Unspecified convulsions: Secondary | ICD-10-CM

## 2021-10-01 ENCOUNTER — Other Ambulatory Visit (HOSPITAL_COMMUNITY): Payer: Self-pay

## 2021-10-03 ENCOUNTER — Telehealth: Payer: Self-pay | Admitting: Neurology

## 2021-10-03 NOTE — Telephone Encounter (Signed)
Patient called and stated she had an EEG done and she was wanting to see if her results are in.

## 2021-10-03 NOTE — Telephone Encounter (Signed)
Pt called an advised that it takes about 2 weeks to get the results back

## 2021-10-07 ENCOUNTER — Other Ambulatory Visit (HOSPITAL_COMMUNITY): Payer: Self-pay

## 2021-10-10 ENCOUNTER — Telehealth: Payer: Self-pay | Admitting: Neurology

## 2021-10-10 ENCOUNTER — Other Ambulatory Visit: Payer: Self-pay

## 2021-10-10 NOTE — Telephone Encounter (Signed)
There are 2 open phone notes for the same issue. I am closing this one. Renee, patient called you back, pls call re: result message from Dr. Tomi Likens.

## 2021-10-10 NOTE — Telephone Encounter (Signed)
Sent message to hospital to to get EMU order

## 2021-10-10 NOTE — Telephone Encounter (Signed)
Called Patient and informed her of Dr. Tomi Likens report. She agreed to do a hospital EEG.

## 2021-10-10 NOTE — Procedures (Signed)
Patient's Name: Connie Hoffman MRN: [id number] Date of Birth: 04/29/1960   Ordering Provider: Metta Clines, MD DX CODE(s): R56.9 Unspecified convulsions EXAM DURATION: 66 hours   CLINICAL HISTORY: This is a 61 year old woman with seizure-like activity during sleep, described as jerking or twitching, head may arch back with grunting, followed by confusion. EEG for classification.    MEDICATION(s): Gabapentin, Folvite, Duloxetine, protonix, Atorvastatin, Eliquis, Toprol XL   TECHNICAL DESCRIPTION: Long-Term EEG with Video was monitored intermittently by a qualified EEG technologist for the entirety of the recording; quality check-ins were performed at a minimum of every two hours, checking, and documenting real-time data and video to assure the integrity and quality of the recording (e.g., camera position, electrode integrity and impedance), and identify the need for maintenance. For intermittent monitoring, an EEG Technologist monitored no more than 12 patients concurrently. Video was being recorded at least 80% of the time during the study duration, unless otherwise noted in an Exception Statement.   At the end of the recording, the EEG Technologist generates a technical description, which is the EEG Technologist's written documentation of the reviewed video-EEG data, including technical interventions and these elements: reviewing raw EEG/VEEG data and events and automated detection as well as patient pushbutton event activations; and annotating, editing, and archiving EEG/VEEG data for review by the physician or other qualified healthcare professional. For review, the Video EEG recording can be visualized in all standard types of montages, 16 channels and greater, and playbacks include digital high frequency filters previously noted. The Video EEG has been notated with patient typical symptom events at the direction of the patient by depressing a push button mounted on a waist worn [employer] EEG  recording device. Digital spike and seizure detection software was used to identify potential abnormalities in the EEG, and alerts were reviewed and annotated by the technologist in the [facility] EEG Review software. Video EEG and report are notated with events that were determined to be of significance by the digital analysis software showing spike and seizure detections.   SET-UP TECH: Jerene Pitch   RECORDING SET-UP DATE: 09/24/2021 4:20PM RECORDING TAKE-DOWN DATE: 09/27/2021 10:47AM   SPIKE AND SEIZURE ANALYSIS AND REVIEW: Spike and seizure detection software alerts have been reviewed by a Production designer, theatre/television/film. None of these alerts appear to have clinical significance.  PUSH BUTTON EVENTS: A patient diary was maintained. The patient did not press button, nor did they describe any typical symptoms. 1 Button press was accidental or monitoring tech driven (Resets)    DESCRIPTION OF RECORDING: During maximal wakefulness, the background activity consisted of a symmetric 10-11 Hz posterior dominant rhythm that was reactive to eye opening and eye closure. The background is symmetric. There were no epileptiform discharges or electrographic seizures seen in wakefulness.   During the recording, the patient progresses through wakefulness, drowsiness, and sleep. Vertex waves and sleep spindles were seen. Again there were no epileptiform discharges seen.   EKG lead was unremarkable.    IMPRESSION: This 66 hour ambulatory EEG is normal.    CLINICAL CORRELATION: A normal EEG does not rule out a clinical diagnosis of epilepsy. Typical events were not captured. If further clinical questions remain, inpatient video EEG monitoring may be helpful.

## 2021-10-10 NOTE — Telephone Encounter (Signed)
Patient called and stated she missed a call from the office.

## 2021-10-10 NOTE — Telephone Encounter (Signed)
Ambulatory EEG was normal.  However, she reportedly didn't have an episode overnight during those three days of recording.  It would be helpful to capture an event.  We weren't able to capture an event for 3 nights.  She says she has at least one a week.  One option would be admission to [facility] for extended EEG monitoring in hope to finally record one of these shaking spells.

## 2021-10-10 NOTE — Telephone Encounter (Signed)
Left message on machine for patient to call back.

## 2021-10-13 ENCOUNTER — Other Ambulatory Visit: Payer: Self-pay

## 2021-10-14 ENCOUNTER — Other Ambulatory Visit: Payer: Self-pay

## 2021-10-14 ENCOUNTER — Telehealth: Payer: Self-pay | Admitting: Neurology

## 2021-10-14 DIAGNOSIS — R569 Unspecified convulsions: Secondary | ICD-10-CM

## 2021-10-14 NOTE — Telephone Encounter (Signed)
Pt called no answer per DPR left a voice mail that ambulatory EEG was normal but they would like to try and capture an episode so Doctors ordered hospital eeg called an EMU and that they would be reaching out to get her scheduled if she has any questions she can call the office back

## 2021-10-14 NOTE — Telephone Encounter (Signed)
Pt received heathers voicemail, stated she is aware of the test. If heather needs anything, she can call her back

## 2021-10-22 NOTE — Progress Notes (Deleted)
Office Visit Note  Patient: Connie Hoffman             Date of Birth: 11-19-1960           MRN: [id number]             PCP: Jenel Lucks, PA-C Referring: Burna Cash* Visit Date: 11/03/2021 Occupation: '@GUAROCC'$ @  Subjective:  No chief complaint on file.   History of Present Illness: Connie Hoffman is a 61 y.o. female ***   Activities of Daily Living:  Patient reports morning stiffness for *** {minute/hour:19697}.   Patient {ACTIONS;DENIES/REPORTS:21021675::"Denies"} nocturnal pain.  Difficulty dressing/grooming: {ACTIONS;DENIES/REPORTS:21021675::"Denies"} Difficulty climbing stairs: {ACTIONS;DENIES/REPORTS:21021675::"Denies"} Difficulty getting out of chair: {ACTIONS;DENIES/REPORTS:21021675::"Denies"} Difficulty using hands for taps, buttons, cutlery, and/or writing: {ACTIONS;DENIES/REPORTS:21021675::"Denies"}  No Rheumatology ROS completed.   PMFS History:  Patient Active Problem List   Diagnosis Date Noted   Diverticulitis of large intestine with abscess without bleeding 05/28/2021   History of pulmonary embolism 05/28/2021   Chronic anticoagulation 05/28/2021   Midline cystocele 05/23/2020   Pelvic mass 12/04/2019   DDD (degenerative disc disease), lumbar 01/21/2018   Primary osteoarthritis of both hands 01/21/2018   Primary osteoarthritis of both feet 01/21/2018   Psoriasis 12/01/2017   Psoriatic arthritis ([place]) 12/01/2017   High risk medication use 12/01/2017   S/P total knee replacement, 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   Thrombocytosis 12/01/2017   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    Past Medical History:  Diagnosis Date   Chronic stable angina (HCC)    DDD (degenerative disc disease), lumbar    Intestinal abscess    Psoriatic arthritis ([place])    Pulmonary embolism ([place])    Bilateral lungs   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[place])    Spinal stenosis     Family History  Problem Relation Age of Onset   Diabetes Mother    Hypertension Mother     Heart Problems Mother    Congestive Heart Failure Mother    Diabetes Father    Hypertension Father    Stroke Sister    Heart Problems Sister    Seizures Sister    Prostate cancer Brother    Diabetes Brother    Asthma Daughter    Healthy Daughter    Healthy Son    Healthy Son    Past Surgical History:  Procedure Laterality Date   BACK SURGERY  05/18/2019   L4 and L5   CHOLECYSTECTOMY     CYSTECTOMY  07/2017   JOINT REPLACEMENT Left    left knee   LAPAROSCOPIC ENDOMETRIOSIS FULGURATION  2011   LAPAROSCOPIC GASTRIC SLEEVE RESECTION  2018   LAPAROSCOPIC OOPHERECTOMY Right    LUMBAR EPIDURAL INJECTION  04/13/2018   REVISION TOTAL KNEE ARTHROPLASTY Left 12/2017   TOTAL SHOULDER ARTHROPLASTY Bilateral    TUBAL LIGATION     postpartum   Social History   Social History Narrative   Right handed    Lives with husband and 1 daughters    Immunization History  Administered Date(s) Administered   Influenza,inj,Quad PF,6+ Mos 04/15/2018   PFIZER(Purple Top)SARS-COV-2 Vaccination 07/15/2019, 08/05/2019, 03/13/2020   Pneumococcal Polysaccharide-23 04/15/2018   Zoster Recombinat (Shingrix) 05/13/2018, 10/25/2019     Objective: Vital Signs: There were no vitals taken for this visit.   Physical Exam   Musculoskeletal Exam: ***  CDAI Exam: CDAI Score: -- Patient Global: --; Provider Global: -- Swollen: --; Tender: -- Joint Exam 11/03/2021   No joint exam has been documented for this visit   There is currently no information documented  on the homunculus. Go to the Rheumatology activity and complete the homunculus joint exam.  Investigation: No additional findings.  Imaging: AMBULATORY EEG  Result Date: 09/24/2021 Cameron Sprang, MD     10/10/2021 12:45 PM Patient's Name: Connie Hoffman MRN: [id number] Date of Birth: April 02, 1961  Ordering Provider: Metta Clines, MD DX CODE(s): R56.9 Unspecified convulsions EXAM DURATION: 66 hours  CLINICAL HISTORY: This is a 61 year old  woman with seizure-like activity during sleep, described as jerking or twitching, head may arch back with grunting, followed by confusion. EEG for classification.  MEDICATION(s): Gabapentin, Folvite, Duloxetine, protonix, Atorvastatin, Eliquis, Toprol XL  TECHNICAL DESCRIPTION: Long-Term EEG with Video was monitored intermittently by a qualified EEG technologist for the entirety of the recording; quality check-ins were performed at a minimum of every two hours, checking, and documenting real-time data and video to assure the integrity and quality of the recording (e.g., camera position, electrode integrity and impedance), and identify the need for maintenance. For intermittent monitoring, an EEG Technologist monitored no more than 12 patients concurrently. Video was being recorded at least 80% of the time during the study duration, unless otherwise noted in an Exception Statement. At the end of the recording, the EEG Technologist generates a technical description, which is the EEG Technologist's written documentation of the reviewed video-EEG data, including technical interventions and these elements: reviewing raw EEG/VEEG data and events and automated detection as well as patient pushbutton event activations; and annotating, editing, and archiving EEG/VEEG data for review by the physician or other qualified healthcare professional. For review, the Video EEG recording can be visualized in all standard types of montages, 16 channels and greater, and playbacks include digital high frequency filters previously noted. The Video EEG has been notated with patient typical symptom events at the direction of the patient by depressing a push button mounted on a waist worn Lifelines EEG recording device. Digital spike and seizure detection software was used to identify potential abnormalities in the EEG, and alerts were reviewed and annotated by the technologist in the [employer] EEG Review software. Video EEG and report are  notated with events that were determined to be of significance by the digital analysis software showing spike and seizure detections.  SET-UP TECH: Jerene Pitch  RECORDING SET-UP DATE: 09/24/2021 4:20PM RECORDING TAKE-DOWN DATE: 09/27/2021 10:47AM  SPIKE AND SEIZURE ANALYSIS AND REVIEW: Spike and seizure detection software alerts have been reviewed by a Production designer, theatre/television/film. None of these alerts appear to have clinical significance. PUSH BUTTON EVENTS: A patient diary was maintained. The patient did not press button, nor did they describe any typical symptoms. 1 Button press was accidental or monitoring tech driven (Resets)  DESCRIPTION OF RECORDING: During maximal wakefulness, the background activity consisted of a symmetric 10-11 Hz posterior dominant rhythm that was reactive to eye opening and eye closure. The background is symmetric. There were no epileptiform discharges or electrographic seizures seen in wakefulness.  During the recording, the patient progresses through wakefulness, drowsiness, and sleep. Vertex waves and sleep spindles were seen. Again there were no epileptiform discharges seen. EKG lead was unremarkable.  IMPRESSION: This 66 hour ambulatory EEG is normal.  CLINICAL CORRELATION: A normal EEG does not rule out a clinical diagnosis of epilepsy. Typical events were not captured. If further clinical questions remain, inpatient video EEG monitoring may be helpful.     Recent Labs: Lab Results  Component Value Date   WBC 7.6 10/23/2020   HGB 12.2 10/23/2020   PLT 405 (H) 10/23/2020  NA 141 10/23/2020   K 4.1 10/23/2020   CL 106 10/23/2020   CO2 28 10/23/2020   GLUCOSE 96 10/23/2020   BUN 10 10/23/2020   CREATININE 0.72 10/23/2020   BILITOT 0.6 10/23/2020   ALKPHOS 99 10/23/2020   AST 21 10/23/2020   ALT 29 10/23/2020   PROT 6.9 10/23/2020   ALBUMIN 3.4 (L) 10/23/2020   CALCIUM 9.1 10/23/2020   GFRAA 103 10/08/2020   QFTBGOLDPLUS NEGATIVE 05/01/2020   June 20, 2021  CBC normal except hemoglobin 11.2, CMP creatinine 0.76, AST ALT normal, alkaline phosphatase 126, TSH normal  Speciality Comments: Cosentyx start date:04/2018  Procedures:  No procedures performed Allergies: Aspirin and Tylenol [acetaminophen]   Assessment / Plan:     Visit Diagnoses: Psoriatic arthritis ([place])  Psoriasis  High risk medication use  Primary osteoarthritis of both hands  Paresthesia of both hands  Primary osteoarthritis of right knee  S/P total knee replacement, left  Primary osteoarthritis of both feet  Ulnar neuropathy of both upper extremities  DDD (degenerative disc disease), lumbar  Chronic anticoagulation  History of pulmonary embolism  Thrombocytosis  Diverticulitis of large intestine with abscess without bleeding  History of gastric bypass  Nocturnal muscle cramps  Orders: No orders of the defined types were placed in this encounter.  No orders of the defined types were placed in this encounter.   Face-to-face time spent with patient was *** minutes. Greater than 50% of time was spent in counseling and coordination of care.  Follow-Up Instructions: No follow-ups on file.   Bo Merino, MD  Note - This record has been created using Editor, commissioning.  Chart creation errors have been sought, but may not always  have been located. Such creation errors do not reflect on  the standard of medical care.

## 2021-10-27 ENCOUNTER — Other Ambulatory Visit: Payer: Self-pay | Admitting: Physician Assistant

## 2021-10-27 ENCOUNTER — Other Ambulatory Visit (HOSPITAL_COMMUNITY): Payer: Self-pay

## 2021-10-27 DIAGNOSIS — L4050 Arthropathic psoriasis, unspecified: Secondary | ICD-10-CM

## 2021-10-27 DIAGNOSIS — L409 Psoriasis, unspecified: Secondary | ICD-10-CM

## 2021-10-27 MED ORDER — OTEZLA 30 MG PO TABS
30.0000 mg | ORAL_TABLET | Freq: Two times a day (BID) | ORAL | 0 refills | Status: DC
  Filled 2021-10-29: qty 60, 30d supply, fill #0

## 2021-10-27 NOTE — Telephone Encounter (Signed)
Next Visit: 11/03/2021  Last Visit: 08/24/2021  Last Fill: 06/26/2021  DX: Psoriatic arthritis   Current Dose per office note 07/28/2021: Rutherford Nail 30 mg 1 tablet by mouth twice daily  Labs: 06/20/2021, RBC 3.89, Hemoglobin 11.2, Hematocrit 34.1, CO2 23, BUN 6, Glucose 169, Alkaline Phosphatase 126, GFR 3, LMOM labs are due  Okay to refill Otezla?

## 2021-10-29 ENCOUNTER — Other Ambulatory Visit (HOSPITAL_COMMUNITY): Payer: Self-pay

## 2021-10-30 ENCOUNTER — Other Ambulatory Visit: Payer: Self-pay | Admitting: *Deleted

## 2021-10-30 DIAGNOSIS — Z79899 Other long term (current) drug therapy: Secondary | ICD-10-CM

## 2021-10-30 NOTE — Progress Notes (Deleted)
Office Visit Note  Patient: Connie Hoffman             Date of Birth: May 27, 1960           MRN: [id number]             PCP: Jenel Lucks, PA-C Referring: Burna Cash* Visit Date: 11/12/2021 Occupation: '@GUAROCC'$ @  Subjective:  No chief complaint on file.   History of Present Illness: Connie Hoffman is a 61 y.o. female ***   Otezla 30 mg 1 tablet by mouth twice daily.  Sharyon Cable after her last office visit on 05/28/2021.  She has not noticed any increased depression or suicidal ideation since initiating otezla.  Previous therapy includes: Cosentyx.   History of diverticulitis with sigmoid colon abscess.    Activities of Daily Living:  Patient reports morning stiffness for *** {minute/hour:19697}.   Patient {ACTIONS;DENIES/REPORTS:21021675::"Denies"} nocturnal pain.  Difficulty dressing/grooming: {ACTIONS;DENIES/REPORTS:21021675::"Denies"} Difficulty climbing stairs: {ACTIONS;DENIES/REPORTS:21021675::"Denies"} Difficulty getting out of chair: {ACTIONS;DENIES/REPORTS:21021675::"Denies"} Difficulty using hands for taps, buttons, cutlery, and/or writing: {ACTIONS;DENIES/REPORTS:21021675::"Denies"}  No Rheumatology ROS completed.   PMFS History:  Patient Active Problem List   Diagnosis Date Noted   Diverticulitis of large intestine with abscess without bleeding 05/28/2021   History of pulmonary embolism 05/28/2021   Chronic anticoagulation 05/28/2021   Midline cystocele 05/23/2020   Pelvic mass 12/04/2019   DDD (degenerative disc disease), lumbar 01/21/2018   Primary osteoarthritis of both hands 01/21/2018   Primary osteoarthritis of both feet 01/21/2018   Psoriasis 12/01/2017   Psoriatic arthritis ([place]) 12/01/2017   High risk medication use 12/01/2017   S/P total knee replacement, 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   Thrombocytosis 12/01/2017   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    Past Medical History:  Diagnosis Date   Chronic stable angina (HCC)     DDD (degenerative disc disease), lumbar    Intestinal abscess    Psoriatic arthritis ([place])    Pulmonary embolism ([place])    Bilateral lungs   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)    Spinal stenosis     Family History  Problem Relation Age of Onset   Diabetes Mother    Hypertension Mother    Heart Problems Mother    Congestive Heart Failure Mother    Diabetes Father    Hypertension Father    Stroke Sister    Heart Problems Sister    Seizures Sister    Prostate cancer Brother    Diabetes Brother    Asthma Daughter    Healthy Daughter    Healthy Son    Healthy Son    Past Surgical History:  Procedure Laterality Date   BACK SURGERY  05/18/2019   L4 and L5   CHOLECYSTECTOMY     CYSTECTOMY  07/2017   JOINT REPLACEMENT Left    left knee   LAPAROSCOPIC ENDOMETRIOSIS FULGURATION  2011   LAPAROSCOPIC GASTRIC SLEEVE RESECTION  2018   LAPAROSCOPIC OOPHERECTOMY Right    LUMBAR EPIDURAL INJECTION  04/13/2018   REVISION TOTAL KNEE ARTHROPLASTY Left 12/2017   TOTAL SHOULDER ARTHROPLASTY Bilateral    TUBAL LIGATION     postpartum   Social History   Social History Narrative   Right handed    Lives with husband and 1 daughters    Immunization History  Administered Date(s) Administered   Influenza,inj,Quad PF,6+ Mos 04/15/2018   PFIZER(Purple Top)SARS-COV-2 Vaccination 07/15/2019, 08/05/2019, 03/13/2020   Pneumococcal Polysaccharide-23 04/15/2018   Zoster Recombinat (Shingrix) 05/13/2018, 10/25/2019     Objective: Vital Signs: There were no vitals taken  for this visit.   Physical Exam   Musculoskeletal Exam: ***  CDAI Exam: CDAI Score: -- Patient Global: --; Provider Global: -- Swollen: --; Tender: -- Joint Exam 11/12/2021   No joint exam has been documented for this visit   There is currently no information documented on the homunculus. Go to the Rheumatology activity and complete the homunculus joint exam.  Investigation: No additional findings.  Imaging: No  results found.  Recent Labs: Lab Results  Component Value Date   WBC 7.6 10/23/2020   HGB 12.2 10/23/2020   PLT 405 (H) 10/23/2020   NA 141 10/23/2020   K 4.1 10/23/2020   CL 106 10/23/2020   CO2 28 10/23/2020   GLUCOSE 96 10/23/2020   BUN 10 10/23/2020   CREATININE 0.72 10/23/2020   BILITOT 0.6 10/23/2020   ALKPHOS 99 10/23/2020   AST 21 10/23/2020   ALT 29 10/23/2020   PROT 6.9 10/23/2020   ALBUMIN 3.4 (L) 10/23/2020   CALCIUM 9.1 10/23/2020   GFRAA 103 10/08/2020   QFTBGOLDPLUS NEGATIVE 05/01/2020    Speciality Comments: Cosentyx start date:04/2018  Procedures:  No procedures performed Allergies: Aspirin and Tylenol [acetaminophen]   Assessment / Plan:     Visit Diagnoses: Psoriatic arthritis ([place])  Psoriasis  High risk medication use  Thrombocytosis  Primary osteoarthritis of both hands  Ulnar neuropathy of both upper extremities  S/P total knee replacement, left  Primary osteoarthritis of right knee  Primary osteoarthritis of both feet  DDD (degenerative disc disease), lumbar  Chronic anticoagulation  History of pulmonary embolism  Diverticulitis of large intestine with abscess without bleeding  History of gastric bypass  Nocturnal muscle cramps  Orders: No orders of the defined types were placed in this encounter.  No orders of the defined types were placed in this encounter.   Face-to-face time spent with patient was *** minutes. Greater than 50% of time was spent in counseling and coordination of care.  Follow-Up Instructions: No follow-ups on file.   Ofilia Neas, PA-C  Note - This record has been created using Dragon software.  Chart creation errors have been sought, but may not always  have been located. Such creation errors do not reflect on  the standard of medical care.

## 2021-10-31 LAB — CBC WITH DIFFERENTIAL/PLATELET
Absolute Monocytes: 643 cells/uL (ref 200–950)
Basophils Absolute: 40 cells/uL (ref 0–200)
Basophils Relative: 0.6 %
Eosinophils Absolute: 67 cells/uL (ref 15–500)
Eosinophils Relative: 1 %
HCT: 35 % (ref 35.0–45.0)
Hemoglobin: 11.3 g/dL — ABNORMAL LOW (ref 11.7–15.5)
Lymphs Abs: 1668 cells/uL (ref 850–3900)
MCH: 28.3 pg (ref 27.0–33.0)
MCHC: 32.3 g/dL (ref 32.0–36.0)
MCV: 87.7 fL (ref 80.0–100.0)
MPV: 10.1 fL (ref 7.5–12.5)
Monocytes Relative: 9.6 %
Neutro Abs: 4281 cells/uL (ref 1500–7800)
Neutrophils Relative %: 63.9 %
Platelets: 370 10*3/uL (ref 140–400)
RBC: 3.99 10*6/uL (ref 3.80–5.10)
RDW: 13.7 % (ref 11.0–15.0)
Total Lymphocyte: 24.9 %
WBC: 6.7 10*3/uL (ref 3.8–10.8)

## 2021-10-31 LAB — COMPLETE METABOLIC PANEL WITH GFR
AG Ratio: 1.3 (calc) (ref 1.0–2.5)
ALT: 31 U/L — ABNORMAL HIGH (ref 6–29)
AST: 29 U/L (ref 10–35)
Albumin: 3.8 g/dL (ref 3.6–5.1)
Alkaline phosphatase (APISO): 139 U/L (ref 37–153)
BUN: 10 mg/dL (ref 7–25)
CO2: 29 mmol/L (ref 20–32)
Calcium: 9 mg/dL (ref 8.6–10.4)
Chloride: 107 mmol/L (ref 98–110)
Creat: 0.8 mg/dL (ref 0.50–1.03)
Globulin: 2.9 g/dL (calc) (ref 1.9–3.7)
Glucose, Bld: 112 mg/dL — ABNORMAL HIGH (ref 65–99)
Potassium: 4.2 mmol/L (ref 3.5–5.3)
Sodium: 144 mmol/L (ref 135–146)
Total Bilirubin: 0.4 mg/dL (ref 0.2–1.2)
Total Protein: 6.7 g/dL (ref 6.1–8.1)
eGFR: 85 mL/min/{1.73_m2} (ref >=60)

## 2021-10-31 NOTE — Progress Notes (Signed)
Hgb is borderline low-11.3.  rest of CBC WNL.   Glucose is 112. ALT is borderline elevated-31.  Rest of CMP WNL. We will continue to monitor.

## 2021-11-03 ENCOUNTER — Encounter: Payer: Self-pay | Admitting: Neurology

## 2021-11-03 ENCOUNTER — Ambulatory Visit: Payer: Medicare Other | Admitting: Rheumatology

## 2021-11-03 ENCOUNTER — Inpatient Hospital Stay (HOSPITAL_COMMUNITY): Payer: Medicare Other

## 2021-11-03 ENCOUNTER — Inpatient Hospital Stay (HOSPITAL_COMMUNITY)
Admission: RE | Admit: 2021-11-03 | Discharge: 2021-11-07 | DRG: 101 | Disposition: A | Discharge status: Home / Self Care | Payer: Medicare Other | Source: Ambulatory Visit | Attending: Neurology | Admitting: Neurology

## 2021-11-03 ENCOUNTER — Inpatient Hospital Stay (HOSPITAL_COMMUNITY): Admit: 2021-11-03 | Payer: Medicare Other | Admitting: Neurology

## 2021-11-03 DIAGNOSIS — R569 Unspecified convulsions: Principal | ICD-10-CM

## 2021-11-03 DIAGNOSIS — I200 Unstable angina: Secondary | ICD-10-CM

## 2021-11-03 DIAGNOSIS — Z86711 Personal history of pulmonary embolism: Secondary | ICD-10-CM

## 2021-11-03 DIAGNOSIS — I208 Other forms of angina pectoris: Secondary | ICD-10-CM | POA: Diagnosis present

## 2021-11-03 DIAGNOSIS — M19041 Primary osteoarthritis, right hand: Secondary | ICD-10-CM

## 2021-11-03 DIAGNOSIS — D75839 Thrombocytosis, unspecified: Secondary | ICD-10-CM

## 2021-11-03 DIAGNOSIS — M5136 Other intervertebral disc degeneration, lumbar region: Secondary | ICD-10-CM

## 2021-11-03 DIAGNOSIS — G5623 Lesion of ulnar nerve, bilateral upper limbs: Secondary | ICD-10-CM

## 2021-11-03 DIAGNOSIS — Z7901 Long term (current) use of anticoagulants: Secondary | ICD-10-CM | POA: Diagnosis not present

## 2021-11-03 DIAGNOSIS — Z79899 Other long term (current) drug therapy: Secondary | ICD-10-CM | POA: Diagnosis not present

## 2021-11-03 DIAGNOSIS — I2699 Other pulmonary embolism without acute cor pulmonale: Secondary | ICD-10-CM

## 2021-11-03 DIAGNOSIS — Z6841 Body Mass Index (BMI) 40.0 and over, adult: Secondary | ICD-10-CM

## 2021-11-03 DIAGNOSIS — Z7722 Contact with and (suspected) exposure to environmental tobacco smoke (acute) (chronic): Secondary | ICD-10-CM | POA: Diagnosis present

## 2021-11-03 DIAGNOSIS — G40909 Epilepsy, unspecified, not intractable, without status epilepticus: Principal | ICD-10-CM | POA: Diagnosis present

## 2021-11-03 DIAGNOSIS — L4050 Arthropathic psoriasis, unspecified: Secondary | ICD-10-CM | POA: Diagnosis present

## 2021-11-03 DIAGNOSIS — M19071 Primary osteoarthritis, right ankle and foot: Secondary | ICD-10-CM

## 2021-11-03 DIAGNOSIS — Z96652 Presence of left artificial knee joint: Secondary | ICD-10-CM

## 2021-11-03 DIAGNOSIS — I959 Hypotension, unspecified: Secondary | ICD-10-CM | POA: Diagnosis not present

## 2021-11-03 DIAGNOSIS — K5720 Diverticulitis of large intestine with perforation and abscess without bleeding: Secondary | ICD-10-CM

## 2021-11-03 DIAGNOSIS — Z87820 Personal history of traumatic brain injury: Secondary | ICD-10-CM | POA: Diagnosis not present

## 2021-11-03 DIAGNOSIS — M1711 Unilateral primary osteoarthritis, right knee: Secondary | ICD-10-CM

## 2021-11-03 DIAGNOSIS — R202 Paresthesia of skin: Secondary | ICD-10-CM

## 2021-11-03 DIAGNOSIS — L409 Psoriasis, unspecified: Secondary | ICD-10-CM

## 2021-11-03 DIAGNOSIS — R252 Cramp and spasm: Secondary | ICD-10-CM

## 2021-11-03 DIAGNOSIS — Z9884 Bariatric surgery status: Secondary | ICD-10-CM

## 2021-11-03 DIAGNOSIS — E785 Hyperlipidemia, unspecified: Secondary | ICD-10-CM | POA: Diagnosis present

## 2021-11-03 LAB — CBC WITH DIFFERENTIAL/PLATELET
Abs Immature Granulocytes: 0.02 10*3/uL (ref 0.00–0.07)
Basophils Absolute: 0 10*3/uL (ref 0.0–0.1)
Basophils Relative: 0 %
Eosinophils Absolute: 0.1 10*3/uL (ref 0.0–0.5)
Eosinophils Relative: 1 %
HCT: 35.4 % — ABNORMAL LOW (ref 36.0–46.0)
Hemoglobin: 11.1 g/dL — ABNORMAL LOW (ref 12.0–15.0)
Immature Granulocytes: 0 %
Lymphocytes Relative: 21 %
Lymphs Abs: 1.4 10*3/uL (ref 0.7–4.0)
MCH: 27.7 pg (ref 26.0–34.0)
MCHC: 31.4 g/dL (ref 30.0–36.0)
MCV: 88.3 fL (ref 80.0–100.0)
Monocytes Absolute: 0.5 10*3/uL (ref 0.1–1.0)
Monocytes Relative: 7 %
Neutro Abs: 4.6 10*3/uL (ref 1.7–7.7)
Neutrophils Relative %: 71 %
Platelets: 379 10*3/uL (ref 150–400)
RBC: 4.01 MIL/uL (ref 3.87–5.11)
RDW: 13.7 % (ref 11.5–15.5)
WBC: 6.5 10*3/uL (ref 4.0–10.5)
nRBC: 0 % (ref 0.0–0.2)

## 2021-11-03 LAB — COMPREHENSIVE METABOLIC PANEL
ALT: 37 U/L (ref 0–44)
AST: 24 U/L (ref 15–41)
Albumin: 3.2 g/dL — ABNORMAL LOW (ref 3.5–5.0)
Alkaline Phosphatase: 121 U/L (ref 38–126)
Anion gap: 10 (ref 5–15)
BUN: 6 mg/dL (ref 6–20)
CO2: 28 mmol/L (ref 22–32)
Calcium: 8.9 mg/dL (ref 8.9–10.3)
Chloride: 105 mmol/L (ref 98–111)
Creatinine, Ser: 0.72 mg/dL (ref 0.44–1.00)
GFR, Estimated: >60 mL/min (ref >60)
Glucose, Bld: 122 mg/dL — ABNORMAL HIGH (ref 70–99)
Potassium: 3.8 mmol/L (ref 3.5–5.1)
Sodium: 143 mmol/L (ref 135–145)
Total Bilirubin: 0.5 mg/dL (ref 0.3–1.2)
Total Protein: 6.7 g/dL (ref 6.5–8.1)

## 2021-11-03 LAB — PROTIME-INR
INR: 1.1 (ref 0.8–1.2)
Prothrombin Time: 14.1 seconds (ref 11.4–15.2)

## 2021-11-03 LAB — MAGNESIUM: Magnesium: 2 mg/dL (ref 1.7–2.4)

## 2021-11-03 LAB — PHOSPHORUS: Phosphorus: 3.5 mg/dL (ref 2.5–4.6)

## 2021-11-03 LAB — HIV ANTIBODY (ROUTINE TESTING W REFLEX): HIV Screen 4th Generation wRfx: NONREACTIVE

## 2021-11-03 MED ORDER — ENOXAPARIN SODIUM 60 MG/0.6ML IJ SOSY
60.0000 mg | PREFILLED_SYRINGE | INTRAMUSCULAR | Status: DC
  Administered 2021-11-03: 60 mg via SUBCUTANEOUS
  Filled 2021-11-03: qty 0.6

## 2021-11-03 MED ORDER — ALBUTEROL SULFATE (2.5 MG/3ML) 0.083% IN NEBU
3.0000 mL | INHALATION_SOLUTION | Freq: Four times a day (QID) | RESPIRATORY_TRACT | Status: DC | PRN
Start: 2021-11-03 — End: 2021-11-07

## 2021-11-03 MED ORDER — ISOSORBIDE MONONITRATE ER 30 MG PO TB24
30.0000 mg | ORAL_TABLET | Freq: Every day | ORAL | Status: DC
  Administered 2021-11-05 – 2021-11-07 (×3): 30 mg via ORAL
  Filled 2021-11-03 (×4): qty 1

## 2021-11-03 MED ORDER — DULOXETINE HCL 30 MG PO CPEP
30.0000 mg | ORAL_CAPSULE | Freq: Every day | ORAL | Status: DC
  Administered 2021-11-03 – 2021-11-06 (×4): 30 mg via ORAL
  Filled 2021-11-03 (×4): qty 1

## 2021-11-03 MED ORDER — METOPROLOL SUCCINATE ER 25 MG PO TB24
25.0000 mg | ORAL_TABLET | Freq: Every day | ORAL | Status: DC
  Administered 2021-11-05 – 2021-11-07 (×3): 25 mg via ORAL
  Filled 2021-11-03 (×4): qty 1

## 2021-11-03 MED ORDER — ENOXAPARIN SODIUM 40 MG/0.4ML IJ SOSY: 40.0000 mg | PREFILLED_SYRINGE | INTRAMUSCULAR | Status: DC

## 2021-11-03 MED ORDER — METHOCARBAMOL 750 MG PO TABS
750.0000 mg | ORAL_TABLET | Freq: Four times a day (QID) | ORAL | Status: DC | PRN
  Administered 2021-11-06: 750 mg via ORAL
  Filled 2021-11-03: qty 1

## 2021-11-03 MED ORDER — ATORVASTATIN CALCIUM 80 MG PO TABS
80.0000 mg | ORAL_TABLET | Freq: Every day | ORAL | Status: DC
  Administered 2021-11-04 – 2021-11-07 (×4): 80 mg via ORAL
  Filled 2021-11-03 (×4): qty 1

## 2021-11-03 MED ORDER — GABAPENTIN 100 MG PO CAPS: 100.0000 mg | ORAL_CAPSULE | Freq: Three times a day (TID) | ORAL | Status: DC

## 2021-11-03 MED ORDER — LORAZEPAM 2 MG/ML IJ SOLN: 2.0000 mg | INTRAMUSCULAR | Status: DC | PRN

## 2021-11-03 MED ORDER — SODIUM CHLORIDE 0.9% FLUSH
3.0000 mL | Freq: Two times a day (BID) | INTRAVENOUS | Status: DC
  Administered 2021-11-03 – 2021-11-06 (×8): 3 mL via INTRAVENOUS

## 2021-11-03 MED ORDER — GABAPENTIN 300 MG PO CAPS
300.0000 mg | ORAL_CAPSULE | Freq: Three times a day (TID) | ORAL | Status: DC
  Administered 2021-11-03 – 2021-11-04 (×3): 300 mg via ORAL
  Filled 2021-11-03 (×3): qty 1

## 2021-11-03 MED ORDER — LABETALOL HCL 5 MG/ML IV SOLN: 10.0000 mg | INTRAVENOUS | Status: DC | PRN

## 2021-11-03 MED ORDER — APIXABAN 5 MG PO TABS
5.0000 mg | ORAL_TABLET | Freq: Two times a day (BID) | ORAL | Status: DC
Start: 2021-11-03 — End: 2021-11-07
  Administered 2021-11-03 – 2021-11-07 (×8): 5 mg via ORAL
  Filled 2021-11-03 (×8): qty 1

## 2021-11-03 MED ORDER — PANTOPRAZOLE SODIUM 40 MG PO TBEC
40.0000 mg | DELAYED_RELEASE_TABLET | Freq: Every day | ORAL | Status: DC
  Administered 2021-11-04 – 2021-11-07 (×4): 40 mg via ORAL
  Filled 2021-11-03 (×4): qty 1

## 2021-11-03 NOTE — Progress Notes (Signed)
     Date: 11/03/2021   Connie Hoffman [address] [place] [address]   Subject: work note   To whom it may concern,    Ms Connie Hoffman is currently admitted at [facility] for workup. She is expected to stay in the hospital on 11/03/2021 and 11/04/2021 ( potentially longer depending on workup). Patient's husband has been requested to stay with her.    Please do not hesitate to contact me for any questions.   Dr Connie Hoffman

## 2021-11-03 NOTE — Progress Notes (Signed)
LTM EEG hooked up and running - no initial skin breakdown - push button tested - neuro notified. [facility] monitoring.  

## 2021-11-03 NOTE — Progress Notes (Signed)
  Transition of Care [facility]) Screening Note   Patient Details  Name: Connie Hoffman Date of Birth: 1961/04/26   Transition of Care [facility]) CM/SW Contact:    Pollie Friar, RN Phone Number: 11/03/2021, 9:44 AM    Transition of Care Department [facility]) has reviewed patient and no TOC needs have been identified at this time. We will continue to monitor patient advancement through interdisciplinary progression rounds. If new patient transition needs arise, please place a TOC consult.
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
## 2021-11-04 ENCOUNTER — Other Ambulatory Visit (HOSPITAL_COMMUNITY): Payer: Self-pay

## 2021-11-04 DIAGNOSIS — R569 Unspecified convulsions: Secondary | ICD-10-CM | POA: Diagnosis not present

## 2021-11-04 MED ORDER — ACETAMINOPHEN 325 MG PO TABS
650.0000 mg | ORAL_TABLET | Freq: Once | ORAL | Status: DC
Start: 2021-11-05 — End: 2021-11-07
  Filled 2021-11-04: qty 2

## 2021-11-04 NOTE — Procedures (Signed)
Patient Name: Connie Hoffman  MRN: [id number]  Epilepsy Attending: Lora Havens  Referring Physician/Provider: Lora Havens, MD Duration: 11/03/2021 [phone] to 11/04/2021 2831  Patient history:  61 year old female with jerking episodes during sleep admitted to epilepsy monitoring unit for characterization of these episodes. EEG to evaluate for seizure.   Level of alertness: Awake, asleep  AEDs during EEG study: GBP  Technical aspects: This EEG study was done with scalp electrodes positioned according to the 10-20 International system of electrode placement. Electrical activity was acquired at a sampling rate of '500Hz'$  and reviewed with a high frequency filter of '70Hz'$  and a low frequency filter of '1Hz'$ . EEG data were recorded continuously and digitally stored.   Description: The posterior dominant rhythm consists of 9 Hz activity of moderate voltage (25-35 uV) seen predominantly in posterior head regions, symmetric and reactive to eye opening and eye closing. Sleep was characterized by vertex waves, sleep spindles (12 to 14 Hz), maximal frontocentral region. Hyperventilation and photic stimulation were not performed.     IMPRESSION: This study is within normal limits. No seizures or epileptiform discharges were seen throughout the recording.  Vala Raffo [name]

## 2021-11-04 NOTE — Progress Notes (Signed)
Pt had a quiet night rest, no seizure like activity observed, pt and family at bedside reassured. Obasogie-Asidi, Nyzir Dubois Efe

## 2021-11-04 NOTE — Progress Notes (Signed)
Subjective: No acute events overnight.  No new concerns.  ROS: negative except above  Examination  Vital signs in last 24 hours: Temp:  [97.8 F (36.6 C)-98.6 F (37 C)] 98.3 F (36.8 C) (07/11 1154) Pulse Rate:  [63-77] 67 (07/11 1154) Resp:  [17-20] 17 (07/11 1154) BP: (105-117)/(46-55) 117/53 (07/11 1154) SpO2:  [99 %] 99 % (07/11 1154)  Constitutional: Appears well-developed and well-nourished.  Neuro: AOx3, cranial nerves II through XII was intact, 5/5 in all extremities  Basic Metabolic Panel: Recent Labs  Lab 10/30/21 1413 11/03/21 1125  NA 144 143  K 4.2 3.8  CL 107 105  CO2 29 28  GLUCOSE 112* 122*  BUN 10 6  CREATININE 0.80 0.72  CALCIUM 9.0 8.9  MG  --  2.0  PHOS  --  3.5    CBC: Recent Labs  Lab 10/30/21 1413 11/03/21 1125  WBC 6.7 6.5  NEUTROABS 4,281 4.6  HGB 11.3* 11.1*  HCT 35.0 35.4*  MCV 87.7 88.3  PLT 370 379     Coagulation Studies: Recent Labs    11/03/21 1125  LABPROT 14.1  INR 1.1    Imaging Reviewed:     ASSESSMENT AND PLAN: 61 year old female with jerking episodes during sleep admitted to epilepsy monitoring unit for characterization of these episodes   Seizures -Continue video EEG for characterization of spells -Hold gabapentin for seizure provocation -Hyperventilation and photic stimulation for seizure provocation -Discussed overnight EEG findings and plan for the day with the patient -Continue seizure precautions -As needed IV Ativan 2 mg for clinical seizure-like activity   Pulmonary embolism - continue home Eliquis  Angina -Head home metoprolol and Imdur due to hypotension this morning   Hyperlipidemia -Continue Lipitor   I have spent a total of  35  minutes with the patient reviewing hospital notes,  test results, labs and examining the patient as well as establishing an assessment and plan that was discussed personally with the patient.  > 50% of time was spent in direct patient care.    Zeb Comfort Epilepsy [facility] For questions after 5pm please refer to AMION to reach the Neurologist on call

## 2021-11-05 DIAGNOSIS — R569 Unspecified convulsions: Secondary | ICD-10-CM | POA: Diagnosis not present

## 2021-11-05 NOTE — Progress Notes (Signed)
LTM maint complete - no skin breakdown under:  Checked multiple leads reglued

## 2021-11-05 NOTE — Progress Notes (Signed)
HV and Photic performed along with maintenance. Patient had no skin breakdown under the frontal leads along with P3.

## 2021-11-05 NOTE — Progress Notes (Signed)
Subjective: No acute events overnight.  Denies any concerns.  ROS: negative except above  Examination  Vital signs in last 24 hours: Temp:  [97.8 F (36.6 C)-98.6 F (37 C)] 97.9 F (36.6 C) (07/12 1135) Pulse Rate:  [64-74] 68 (07/12 1135) Resp:  [19-20] 19 (07/12 1135) BP: (105-130)/(30-59) 130/59 (07/12 1135) SpO2:  [98 %-100 %] 99 % (07/12 1135)  Constitutional: Appears well-developed and well-nourished.  Neuro: AOx3, cranial nerves II through XII was intact, 5/5 in all extremities  Basic Metabolic Panel: Recent Labs  Lab 10/30/21 1413 11/03/21 1125  NA 144 143  K 4.2 3.8  CL 107 105  CO2 29 28  GLUCOSE 112* 122*  BUN 10 6  CREATININE 0.80 0.72  CALCIUM 9.0 8.9  MG  --  2.0  PHOS  --  3.5    CBC: Recent Labs  Lab 10/30/21 1413 11/03/21 1125  WBC 6.7 6.5  NEUTROABS 4,281 4.6  HGB 11.3* 11.1*  HCT 35.0 35.4*  MCV 87.7 88.3  PLT 370 379     Coagulation Studies: Recent Labs    11/03/21 1125  LABPROT 14.1  INR 1.1    Imaging No new brain imaging overnight  ASSESSMENT AND PLAN: 61 year old female with jerking episodes during sleep admitted to epilepsy monitoring unit for characterization of these episodes   Seizures -Continue video EEG for characterization of spells -Hold gabapentin for seizure provocation -Discussed overnight EEG findings and plan for the day with the patient -Continue seizure precautions -As needed IV Ativan 2 mg for clinical seizure-like activity   Pulmonary embolism - continue home Eliquis  Angina -Head home metoprolol and Imdur due to hypotension this morning   Hyperlipidemia -Continue Lipitor    I have spent a total of  26 minutes with the patient reviewing hospital notes,  test results, labs and examining the patient as well as establishing an assessment and plan that was discussed personally with the patient.  > 50% of time was spent in direct patient care.   Zeb Comfort Epilepsy [facility] For  questions after 5pm please refer to AMION to reach the Neurologist on call

## 2021-11-05 NOTE — Procedures (Signed)
Patient Name: Connie Hoffman  MRN: [id number]  Epilepsy Attending: Lora Havens  Referring Physician/Provider: Lora Havens, MD Duration: 11/04/2021 [phone] to 11/05/2021 1410   Patient history:  61 year old female with jerking episodes during sleep admitted to epilepsy monitoring unit for characterization of these episodes. EEG to evaluate for seizure.    Level of alertness: Awake, asleep   AEDs during EEG study: GBP   Technical aspects: This EEG study was done with scalp electrodes positioned according to the 10-20 International system of electrode placement. Electrical activity was acquired at a sampling rate of '500Hz'$  and reviewed with a high frequency filter of '70Hz'$  and a low frequency filter of '1Hz'$ . EEG data were recorded continuously and digitally stored.    Description: The posterior dominant rhythm consists of 9 Hz activity of moderate voltage (25-35 uV) seen predominantly in posterior head regions, symmetric and reactive to eye opening and eye closing. Sleep was characterized by vertex waves, sleep spindles (12 to 14 Hz), maximal frontocentral region.  No EEG change was seen during hyperventilation.  Photic driving was seen during photic stimulation.    IMPRESSION: This study is within normal limits. No seizures or epileptiform discharges were seen throughout the recording.  Connie Hoffman

## 2021-11-06 ENCOUNTER — Other Ambulatory Visit: Payer: Self-pay

## 2021-11-06 ENCOUNTER — Encounter (HOSPITAL_COMMUNITY): Payer: Self-pay | Admitting: Neurology

## 2021-11-06 DIAGNOSIS — R569 Unspecified convulsions: Secondary | ICD-10-CM | POA: Diagnosis not present

## 2021-11-06 MED ORDER — GABAPENTIN 300 MG PO CAPS
300.0000 mg | ORAL_CAPSULE | Freq: Three times a day (TID) | ORAL | Status: DC
  Administered 2021-11-06 – 2021-11-07 (×2): 300 mg via ORAL
  Filled 2021-11-06 (×2): qty 1

## 2021-11-06 NOTE — Care Management Important Message (Signed)
Important Message  Patient Details  Name: Connie Hoffman MRN: [id number] Date of Birth: 11/08/1960   Medicare Important Message Given:  Yes     Orbie Pyo 11/06/2021, 2:22 PM

## 2021-11-06 NOTE — Progress Notes (Signed)
Performed maintenance.  All under 10.  No skin breakdown

## 2021-11-06 NOTE — Progress Notes (Signed)
Subjective: No acute events overnight.  No new concerns.  ROS: negative except above  Examination  Vital signs in last 24 hours: Temp:  [97.7 F (36.5 C)-98.2 F (36.8 C)] 97.8 F (36.6 C) (07/13 1121) Pulse Rate:  [63-70] 64 (07/13 1121) Resp:  [16-18] 18 (07/13 1121) BP: (122-130)/(45-72) 127/50 (07/13 1121) SpO2:  [98 %-100 %] 99 % (07/13 1121)  Constitutional: Appears well-developed and well-nourished.  Neuro: AOx3, cranial nerves II through XII was intact, 5/5 in all extremities    Basic Metabolic Panel: Recent Labs  Lab 10/30/21 1413 11/03/21 1125  NA 144 143  K 4.2 3.8  CL 107 105  CO2 29 28  GLUCOSE 112* 122*  BUN 10 6  CREATININE 0.80 0.72  CALCIUM 9.0 8.9  MG  --  2.0  PHOS  --  3.5    CBC: Recent Labs  Lab 10/30/21 1413 11/03/21 1125  WBC 6.7 6.5  NEUTROABS 4,281 4.6  HGB 11.3* 11.1*  HCT 35.0 35.4*  MCV 87.7 88.3  PLT 370 379     Coagulation Studies: No results for input(s): "LABPROT", "INR" in the last 72 hours.  Imaging No new brain imaging overnight   ASSESSMENT AND PLAN: 61 year old female with jerking episodes during sleep admitted to epilepsy monitoring unit for characterization of these episodes   Seizures -Continue video EEG for characterization of spells -Resume gabapentin toight -Discussed overnight EEG findings and plan for the day with the patient.  Discussed plan to discharge tomorrow. -Continue seizure precautions -As needed IV Ativan 2 mg for clinical seizure-like activity   Pulmonary embolism - continue home Eliquis  Angina -Continue home metoprolol and Imdur due to hypotension this morning   Hyperlipidemia -Continue Lipitor    I have spent a total of  26 minutes with the patient reviewing hospital notes,  test results, labs and examining the patient as well as establishing an assessment and plan that was discussed personally with the patient.  > 50% of time was spent in direct patient care.  Zeb Comfort Epilepsy [facility] For questions after 5pm please refer to AMION to reach the Neurologist on call

## 2021-11-06 NOTE — Progress Notes (Signed)
vLTM maintenance   All impedances below 10kohms.  No skin breakdown noted at all skin sites 

## 2021-11-06 NOTE — Procedures (Signed)
Patient Name: Connie Hoffman  MRN: [id number]  Epilepsy Attending: Lora Havens  Referring Physician/Provider: Lora Havens, MD Duration: 11/05/2021 [phone] to 11/06/2021 2518   Patient history:  61 year old female with jerking episodes during sleep admitted to epilepsy monitoring unit for characterization of these episodes. EEG to evaluate for seizure.    Level of alertness: Awake, asleep   AEDs during EEG study: None   Technical aspects: This EEG study was done with scalp electrodes positioned according to the 10-20 International system of electrode placement. Electrical activity was acquired at a sampling rate of '500Hz'$  and reviewed with a high frequency filter of '70Hz'$  and a low frequency filter of '1Hz'$ . EEG data were recorded continuously and digitally stored.    Description: The posterior dominant rhythm consists of 9 Hz activity of moderate voltage (25-35 uV) seen predominantly in posterior head regions, symmetric and reactive to eye opening and eye closing. Sleep was characterized by vertex waves, sleep spindles (12 to 14 Hz), maximal frontocentral region.     IMPRESSION: This study is within normal limits. No seizures or epileptiform discharges were seen throughout the recording.  [name] [name]

## 2021-11-07 DIAGNOSIS — R569 Unspecified convulsions: Secondary | ICD-10-CM | POA: Diagnosis not present

## 2021-11-07 NOTE — Progress Notes (Signed)
Patient ready for discharge to home; discharge instructions given and reviewed; no new RX; patient's spouse is at the bedside; patient discharged out via wheelchair with volunteer services; all belongings returned.

## 2021-11-07 NOTE — Discharge Summary (Addendum)
Physician Discharge Summary  Patient ID: Connie Hoffman MRN: [id number] DOB/AGE: Aug 28, 1960 61 y.o.  Admit date: 11/03/2021 Discharge date: 11/07/2021  Admission Diagnoses: Seizure  Discharge Diagnoses: Nonepileptic events  Discharged Condition: stable  Hospital Course: Ms. Devivo was admitted to epilepsy monitoring unit from 11/03/2021 to 11/07/2021.  During this time, she underwent continuous video EEG monitoring.  Gabapentin was held, hyperventilation, photic simulation were performed for seizure provocation.  No typical events were reported.  EEG was within normal limits.  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  These episodes predominantly happen during sleep, sleep myoclonus is and also in the differential.  Can consider sleep study if needed.  Also can consider cognitive behavioral therapy.  Consults: None  Significant Diagnostic Studies: EEG   Description: The posterior dominant rhythm consists of 9 Hz activity of moderate voltage (25-35 uV) seen predominantly in posterior head regions, symmetric and reactive to eye opening and eye closing. Sleep was characterized by vertex waves, sleep spindles (12 to 14 Hz), maximal frontocentral region.  No EEG change was seen during hyperventilation.  Photic driving was seen during photic stimulation.     IMPRESSION: This study is within normal limits. No seizures or epileptiform discharges were seen throughout the recording.  Treatments: Continue home medications  Discharge Exam: Blood pressure (!) 101/39, pulse 78, temperature 98.7 F (37.1 C), temperature source Oral, resp. rate 20, height '5\' 5"'$  (1.651 m), weight 121.1 kg, SpO2 100 %.  Constitutional: Appears well-developed and well-nourished.  Neuro: AOx3, cranial nerves II through XII was intact, 5/5 in all extremities  Disposition: Discharge disposition: 01-Home or Self Care   Discharge Instructions      Call MD for:   Complete by: As directed    If patient has another seizure, call 911 and bring them back to the ED if: A.  The seizure lasts longer than 5 minutes.      B.  The patient doesn't wake shortly after the seizure or has new problems such as difficulty seeing, speaking or moving following the seizure C.  The patient was injured during the seizure D.  The patient has a temperature over 102 F (39C) E.  The patient vomited during the seizure and now is having trouble breathing      Diet - low sodium heart healthy   Complete by: As directed    Increase activity slowly   Complete by: As directed    Other Restrictions   Complete by: As directed    Seizure precautions: Per [facility] statutes, patients with seizures are not allowed to drive until they have been seizure-free for six months and cleared by a physician    Use caution when using heavy equipment or power tools. Avoid working on ladders or at heights. Take showers instead of baths. Ensure the water temperature is not too high on the home water heater. Do not go swimming alone. Do not lock yourself in a room alone (i.e. bathroom). When caring for infants or small children, sit down when holding, feeding, or changing them to minimize risk of injury to the child in the event you have a seizure. Maintain good sleep hygiene. Avoid alcohol.         Allergies as of 11/07/2021       Reactions   Aspirin Other (See Comments)   GI bleeding   Tylenol [acetaminophen] Other (See Comments)   Raised patient's liver enzymes  Medication List     TAKE these medications    albuterol 108 (90 Base) MCG/ACT inhaler Commonly known as: VENTOLIN HFA Inhale 2 puffs into the lungs every 6 (six) hours as needed.   apixaban 5 MG Tabs tablet Commonly known as: ELIQUIS Take by mouth.   atorvastatin 80 MG tablet Commonly known as: LIPITOR Take 80 mg by mouth daily.   Biofreeze 4 % Gel Generic drug: Menthol (Topical  Analgesic) Apply 1 application topically See admin instructions. Apply to painful sites daily as directed   DULoxetine 30 MG capsule Commonly known as: CYMBALTA Take 30 mg by mouth at bedtime.   folic acid 1 MG tablet Commonly known as: FOLVITE Take 1 mg by mouth daily.   gabapentin 300 MG capsule Commonly known as: NEURONTIN Take 1 capsule (300 mg total) by mouth 3 (three) times daily.   isosorbide mononitrate 30 MG 24 hr tablet Commonly known as: IMDUR Take 30 mg by mouth daily.   methocarbamol 500 MG tablet Commonly known as: ROBAXIN Take 500 mg by mouth 2 (two) times daily as needed for muscle spasms.   metoprolol succinate 25 MG 24 hr tablet Commonly known as: TOPROL-XL Take 25 mg by mouth daily.   nitroGLYCERIN 0.4 MG SL tablet Commonly known as: NITROSTAT Place 0.4 mg under the tongue every 5 (five) minutes as needed for chest pain.   NONFORMULARY OR COMPOUNDED ITEM Apply 1 Application topically daily as needed (rash, itching). Triamcinolone compounded with unknown   Otezla 30 MG Tabs Generic drug: Apremilast Take 1 tablet (30 mg total) by mouth in the morning and at bedtime.   pantoprazole 40 MG tablet Commonly known as: PROTONIX Take 40 mg by mouth daily.       I have spent a total of  36  minutes with the patient reviewing hospital notes,  test results, labs and examining the patient as well as establishing an assessment and plan that was discussed personally with the patient.  > 50% of time was spent in direct patient care.    Signed: Lora Havens 11/07/2021, 10:43 AM

## 2021-11-07 NOTE — Procedures (Addendum)
Patient Name: Connie Hoffman  MRN: [id number]  Epilepsy Attending: Lora Havens  Referring Physician/Provider: Lora Havens, MD Duration: 11/06/2021 0917 to 11/07/2021 1028   Patient history:  60 year old female with jerking episodes during sleep admitted to epilepsy monitoring unit for characterization of these episodes. EEG to evaluate for seizure.    Level of alertness: Awake, asleep   AEDs during EEG study: None   Technical aspects: This EEG study was done with scalp electrodes positioned according to the 10-20 International system of electrode placement. Electrical activity was acquired at a sampling rate of '500Hz'$  and reviewed with a high frequency filter of '70Hz'$  and a low frequency filter of '1Hz'$ . EEG data were recorded continuously and digitally stored.    Description: The posterior dominant rhythm consists of 9 Hz activity of moderate voltage (25-35 uV) seen predominantly in posterior head regions, symmetric and reactive to eye opening and eye closing. Sleep was characterized by vertex waves, sleep spindles (12 to 14 Hz), maximal frontocentral region.     IMPRESSION: This study is within normal limits. No seizures or epileptiform discharges were seen throughout the recording.  Connie Hoffman

## 2021-11-07 NOTE — TOC Transition Note (Signed)
Transition of Care [facility]) - CM/SW Discharge Note   Patient Details  Name: Connie Hoffman MRN: [id number] Date of Birth: Apr 14, 1961  [facility]) CM/SW Contact:  Pollie Friar, RN Phone Number: 11/07/2021, 10:53 AM   Clinical Narrative:    Pt discharging home with self care. No needs per TOC.    Final next level of care: Home/Self Care Barriers to Discharge: No Barriers Identified   Patient Goals and CMS Choice        Discharge Placement                       Discharge Plan and Services                                     Social Determinants of Health (SDOH) Interventions     Readmission Risk Interventions     No data to display

## 2021-11-07 NOTE — Discharge Instructions (Addendum)
You were admitted to epilepsy monitoring unit from 11/03/2021 to 11/07/2021.  During this time you underwent continuous video EEG monitoring.  Gabapentin was held, photic stimulation and hyperventilation were performed.  Your EEG was within normal limits. No typical events were recorded.  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  You can continue current medications.  I do not recommend adding any further antiepileptic drugs.  Recommend cognitive behavioral therapy if episodes persist and a sleep study.

## 2021-11-07 NOTE — Progress Notes (Signed)
LTM EEG discontinued - no skin breakdown at unhook.   

## 2021-11-12 ENCOUNTER — Ambulatory Visit: Payer: Medicare Other | Admitting: Physician Assistant

## 2021-11-12 DIAGNOSIS — R252 Cramp and spasm: Secondary | ICD-10-CM

## 2021-11-12 DIAGNOSIS — M5136 Other intervertebral disc degeneration, lumbar region: Secondary | ICD-10-CM

## 2021-11-12 DIAGNOSIS — L4050 Arthropathic psoriasis, unspecified: Secondary | ICD-10-CM

## 2021-11-12 DIAGNOSIS — D75839 Thrombocytosis, unspecified: Secondary | ICD-10-CM

## 2021-11-12 DIAGNOSIS — Z96652 Presence of left artificial knee joint: Secondary | ICD-10-CM

## 2021-11-12 DIAGNOSIS — G5623 Lesion of ulnar nerve, bilateral upper limbs: Secondary | ICD-10-CM

## 2021-11-12 DIAGNOSIS — M1711 Unilateral primary osteoarthritis, right knee: Secondary | ICD-10-CM

## 2021-11-12 DIAGNOSIS — L409 Psoriasis, unspecified: Secondary | ICD-10-CM

## 2021-11-12 DIAGNOSIS — Z79899 Other long term (current) drug therapy: Secondary | ICD-10-CM

## 2021-11-12 DIAGNOSIS — Z7901 Long term (current) use of anticoagulants: Secondary | ICD-10-CM

## 2021-11-12 DIAGNOSIS — K5720 Diverticulitis of large intestine with perforation and abscess without bleeding: Secondary | ICD-10-CM

## 2021-11-12 DIAGNOSIS — Z86711 Personal history of pulmonary embolism: Secondary | ICD-10-CM

## 2021-11-12 DIAGNOSIS — M19041 Primary osteoarthritis, right hand: Secondary | ICD-10-CM

## 2021-11-12 DIAGNOSIS — Z9884 Bariatric surgery status: Secondary | ICD-10-CM

## 2021-11-12 DIAGNOSIS — M19071 Primary osteoarthritis, right ankle and foot: Secondary | ICD-10-CM

## 2021-11-13 NOTE — Progress Notes (Deleted)
Office Visit Note  Patient: Connie Hoffman             Date of Birth: [birth year]           MRN: [id number]             PCP: Jenel Lucks, PA-C Referring: Burna Cash* Visit Date: 11/19/2021 Occupation: '@GUAROCC'$ @  Subjective:    History of Present Illness: Connie Hoffman is a 61 y.o. female with history of psoriatic arthritis and osteoarthritis.  She is taking otezla 30 mg 1 tablet by mouth twice daily.    CBC and CMP updated on 11/12/21.   Activities of Daily Living:  Patient reports morning stiffness for *** {minute/hour:19697}.   Patient {ACTIONS;DENIES/REPORTS:21021675::"Denies"} nocturnal pain.  Difficulty dressing/grooming: {ACTIONS;DENIES/REPORTS:21021675::"Denies"} Difficulty climbing stairs: {ACTIONS;DENIES/REPORTS:21021675::"Denies"} Difficulty getting out of chair: {ACTIONS;DENIES/REPORTS:21021675::"Denies"} Difficulty using hands for taps, buttons, cutlery, and/or writing: {ACTIONS;DENIES/REPORTS:21021675::"Denies"}  No Rheumatology ROS completed.   PMFS History:  Patient Active Problem List   Diagnosis Date Noted   Seizure ([place]) 11/03/2021   Diverticulitis of large intestine with abscess without bleeding 05/28/2021   History of pulmonary embolism 05/28/2021   Chronic anticoagulation 05/28/2021   Midline cystocele 05/23/2020   Pelvic mass 12/04/2019   DDD (degenerative disc disease), lumbar 01/21/2018   Primary osteoarthritis of both hands 01/21/2018   Primary osteoarthritis of both feet 01/21/2018   Psoriasis 12/01/2017   Psoriatic arthritis ([place]) 12/01/2017   High risk medication use 12/01/2017   S/P total knee replacement, 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   Thrombocytosis 12/01/2017   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    Past Medical History:  Diagnosis Date   Chronic stable angina (HCC)    DDD (degenerative disc disease), lumbar    Intestinal abscess    Psoriatic arthritis ([place])    Pulmonary embolism ([place])    Bilateral lungs    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[place])    Spinal stenosis     Family History  Problem Relation Age of Onset   Diabetes Mother    Hypertension Mother    Heart Problems Mother    Congestive Heart Failure Mother    Diabetes Father    Hypertension Father    Stroke Sister    Heart Problems Sister    Seizures Sister    Prostate cancer Brother    Diabetes Brother    Asthma Daughter    Healthy Daughter    Healthy Son    Healthy Son    Past Surgical History:  Procedure Laterality Date   BACK SURGERY  05/18/2019   L4 and L5   CHOLECYSTECTOMY     CYSTECTOMY  07/2017   JOINT REPLACEMENT Left    left knee   LAPAROSCOPIC ENDOMETRIOSIS FULGURATION  2011   LAPAROSCOPIC GASTRIC SLEEVE RESECTION  2018   LAPAROSCOPIC OOPHERECTOMY Right    LUMBAR EPIDURAL INJECTION  04/13/2018   REVISION TOTAL KNEE ARTHROPLASTY Left 12/2017   TOTAL SHOULDER ARTHROPLASTY Bilateral    TUBAL LIGATION     postpartum   Social History   Social History Narrative   Right handed    Lives with husband and 1 daughters    Immunization History  Administered Date(s) Administered   Influenza,inj,Quad PF,6+ Mos 04/15/2018   PFIZER(Purple Top)SARS-COV-2 Vaccination 07/15/2019, 08/05/2019, 03/13/2020   Pneumococcal Polysaccharide-23 04/15/2018   Zoster Recombinat (Shingrix) 05/13/2018, 10/25/2019     Objective: Vital Signs: There were no vitals taken for this visit.   Physical Exam Vitals and nursing note reviewed.  Constitutional:      Appearance: She is  well-developed.  HENT:     Head: Normocephalic and atraumatic.  Eyes:     Conjunctiva/sclera: Conjunctivae normal.  Cardiovascular:     Rate and Rhythm: Normal rate and regular rhythm.     Heart sounds: Normal heart sounds.  Pulmonary:     Effort: Pulmonary effort is normal.     Breath sounds: Normal breath sounds.  Abdominal:     General: Bowel sounds are normal.     Palpations: Abdomen is soft.  Musculoskeletal:     Cervical back: Normal range of motion.   Skin:    General: Skin is warm and dry.     Capillary Refill: Capillary refill takes less than 2 seconds.  Neurological:     Mental Status: She is alert and oriented to person, place, and time.  Psychiatric:        Behavior: Behavior normal.      Musculoskeletal Exam: ***  CDAI Exam: CDAI Score: -- Patient Global: --; Provider Global: -- Swollen: --; Tender: -- Joint Exam 11/19/2021   No joint exam has been documented for this visit   There is currently no information documented on the homunculus. Go to the Rheumatology activity and complete the homunculus joint exam.  Investigation: No additional findings.  Imaging: Overnight EEG with video  Result Date: 11/04/2021 Lora Havens, MD     11/04/2021  9:37 AM Patient Name: Connie Hoffman MRN: [id number] Epilepsy Attending: Lora Havens Referring Physician/Provider: Lora Havens, MD Duration: 11/03/2021 [phone] to 11/04/2021 [id number] Patient history:  61 year old female with jerking episodes during sleep admitted to epilepsy monitoring unit for characterization of these episodes. EEG to evaluate for seizure. Level of alertness: Awake, asleep AEDs during EEG study: GBP Technical aspects: This EEG study was done with scalp electrodes positioned according to the 10-20 International system of electrode placement. Electrical activity was acquired at a sampling rate of '500Hz'$  and reviewed with a high frequency filter of '70Hz'$  and a low frequency filter of '1Hz'$ . EEG data were recorded continuously and digitally stored. Description: The posterior dominant rhythm consists of 9 Hz activity of moderate voltage (25-35 uV) seen predominantly in posterior head regions, symmetric and reactive to eye opening and eye closing. Sleep was characterized by vertex waves, sleep spindles (12 to 14 Hz), maximal frontocentral region. Hyperventilation and photic stimulation were not performed.   IMPRESSION: This study is within normal limits. No seizures or  epileptiform discharges were seen throughout the recording. Priyanka [name]    Recent Labs: Lab Results  Component Value Date   WBC 6.5 11/03/2021   HGB 11.1 (L) 11/03/2021   PLT 379 11/03/2021   NA 143 11/03/2021   K 3.8 11/03/2021   CL 105 11/03/2021   CO2 28 11/03/2021   GLUCOSE 122 (H) 11/03/2021   BUN 6 11/03/2021   CREATININE 0.72 11/03/2021   BILITOT 0.5 11/03/2021   ALKPHOS 121 11/03/2021   AST 24 11/03/2021   ALT 37 11/03/2021   PROT 6.7 11/03/2021   ALBUMIN 3.2 (L) 11/03/2021   CALCIUM 8.9 11/03/2021   GFRAA 103 10/08/2020   QFTBGOLDPLUS NEGATIVE 05/01/2020    Speciality Comments: Cosentyx start date:04/2018  Procedures:  No procedures performed Allergies: Aspirin and Tylenol [acetaminophen]   Assessment / Plan:     Visit Diagnoses: No diagnosis found.  Orders: No orders of the defined types were placed in this encounter.  No orders of the defined types were placed in this encounter.   Face-to-face time spent with patient was *** minutes.  Greater than 50% of time was spent in counseling and coordination of care.  Follow-Up Instructions: No follow-ups on file.   Earnestine Mealing, CMA  Note - This record has been created using Editor, commissioning.  Chart creation errors have been sought, but may not always  have been located. Such creation errors do not reflect on  the standard of medical care.

## 2021-11-17 ENCOUNTER — Telehealth: Payer: Self-pay | Admitting: Pharmacy Technician

## 2021-11-17 ENCOUNTER — Other Ambulatory Visit (HOSPITAL_COMMUNITY): Payer: Self-pay

## 2021-11-17 NOTE — Telephone Encounter (Signed)
Patient Advocate Encounter  Prior Authorization for OTEZLA '30MG'$  has been approved.    PA# [id number] Effective dates: 07.24.23 through 12.31.23  Kaylee Wombles B. CPhT P: [phone] F: [phone]

## 2021-11-17 NOTE — Telephone Encounter (Deleted)
Submitted a Prior Authorization request to [facility] for OTEZLA via CoverMyMeds. Will update once we receive a response.   KEY: [id number]

## 2021-11-17 NOTE — Telephone Encounter (Signed)
Submitted a Prior Authorization request to [facility] for OTEZLA via CoverMyMeds. Will update once we receive a response.     KEY: [id number]

## 2021-11-17 NOTE — Telephone Encounter (Signed)
ERROR

## 2021-11-19 ENCOUNTER — Ambulatory Visit: Payer: Medicare Other | Admitting: Physician Assistant

## 2021-11-19 DIAGNOSIS — Z79899 Other long term (current) drug therapy: Secondary | ICD-10-CM

## 2021-11-19 DIAGNOSIS — Z86711 Personal history of pulmonary embolism: Secondary | ICD-10-CM

## 2021-11-19 DIAGNOSIS — Z9884 Bariatric surgery status: Secondary | ICD-10-CM

## 2021-11-19 DIAGNOSIS — G5623 Lesion of ulnar nerve, bilateral upper limbs: Secondary | ICD-10-CM

## 2021-11-19 DIAGNOSIS — L409 Psoriasis, unspecified: Secondary | ICD-10-CM

## 2021-11-19 DIAGNOSIS — L4050 Arthropathic psoriasis, unspecified: Secondary | ICD-10-CM

## 2021-11-19 DIAGNOSIS — D75839 Thrombocytosis, unspecified: Secondary | ICD-10-CM

## 2021-11-19 DIAGNOSIS — M19041 Primary osteoarthritis, right hand: Secondary | ICD-10-CM

## 2021-11-19 DIAGNOSIS — Z7901 Long term (current) use of anticoagulants: Secondary | ICD-10-CM

## 2021-11-19 DIAGNOSIS — K5720 Diverticulitis of large intestine with perforation and abscess without bleeding: Secondary | ICD-10-CM

## 2021-11-19 DIAGNOSIS — Z96652 Presence of left artificial knee joint: Secondary | ICD-10-CM

## 2021-11-19 DIAGNOSIS — M5136 Other intervertebral disc degeneration, lumbar region: Secondary | ICD-10-CM

## 2021-11-19 DIAGNOSIS — M19071 Primary osteoarthritis, right ankle and foot: Secondary | ICD-10-CM

## 2021-11-19 DIAGNOSIS — M1711 Unilateral primary osteoarthritis, right knee: Secondary | ICD-10-CM

## 2021-11-24 ENCOUNTER — Other Ambulatory Visit (HOSPITAL_COMMUNITY): Payer: Self-pay

## 2021-11-24 ENCOUNTER — Other Ambulatory Visit: Payer: Self-pay | Admitting: Physician Assistant

## 2021-11-24 DIAGNOSIS — L409 Psoriasis, unspecified: Secondary | ICD-10-CM

## 2021-11-24 DIAGNOSIS — L4050 Arthropathic psoriasis, unspecified: Secondary | ICD-10-CM

## 2021-11-25 ENCOUNTER — Other Ambulatory Visit (HOSPITAL_COMMUNITY): Payer: Self-pay

## 2021-11-25 MED ORDER — OTEZLA 30 MG PO TABS
30.0000 mg | ORAL_TABLET | Freq: Two times a day (BID) | ORAL | 0 refills | Status: DC
  Filled 2021-11-25: qty 60, 30d supply, fill #0

## 2021-11-25 NOTE — Telephone Encounter (Signed)
Next Visit: Due July 2023. Message sent to the front to schedule.   Last Visit: 07/28/2021  Last Fill: 10/27/2021 (30 day supply)  DX: Psoriatic arthritis   Current Dose per office note 07/28/2021: Otezla 30 mg 1 tablet by mouth twice daily  Labs: 11/12/2021 Glucose 150, Alk. Phos 154, ALT 53, Hgb 11.7, MCHC 31.6  Okay to refill Otezla?

## 2021-11-25 NOTE — Telephone Encounter (Signed)
Please schedule patient a follow up visit. Patient was due July 2023. Thanks! Patient cancelled a follow up and no showed 2 in a row. Please advise patient if she cancels or no shows the next appointment we will not be able to reschedule her.

## 2021-11-27 ENCOUNTER — Other Ambulatory Visit: Payer: Self-pay | Admitting: Pharmacist

## 2021-11-27 ENCOUNTER — Other Ambulatory Visit (HOSPITAL_COMMUNITY): Payer: Self-pay

## 2021-11-27 DIAGNOSIS — L409 Psoriasis, unspecified: Secondary | ICD-10-CM

## 2021-11-27 DIAGNOSIS — L4050 Arthropathic psoriasis, unspecified: Secondary | ICD-10-CM

## 2021-11-27 MED ORDER — OTEZLA 30 MG PO TABS
30.0000 mg | ORAL_TABLET | Freq: Two times a day (BID) | ORAL | 0 refills | Status: DC
  Filled 2021-11-27 (×2): qty 60, 30d supply, fill #0

## 2021-11-27 NOTE — Progress Notes (Unsigned)
Office Visit Note  Patient: Connie Hoffman             Date of Birth: Apr 19, 1961           MRN: [id number]             PCP: Jenel Lucks, PA-C Referring: Burna Cash* Visit Date: 12/03/2021 Occupation: '@GUAROCC'$ @  Subjective:  Pain in multiple joints   History of Present Illness: Connie Hoffman is a 61 y.o. female with history of psoriatic arthritis and osteoarthritis.  She is taking otezla 30 mg 1 tablet by mouth twice daily.  She reports that she takes an Imodium about once a week for diarrhea but overall has been tolerating Otezla without any side effects.  She denies any increased depression or suicidal ideation.  She has ongoing fatigue.   Patient reports that she has been experiencing increased pain involving multiple joints.  Her pain has been most severe in both hands and the right knee joint.  She has noticed intermittent swelling in both hands.  She underwent ulnar nerve impingement release surgery for both elbows as well as left middle trigger finger release surgery earlier this spring.  She states that she had Visco gel injections in the right knee in March 2023 with minimal improvement in her symptoms.  She states that she has chronic pain in the left knee joint replacement.  She has also been having increased discomfort in her lower back and has reached out to Dr. Katherine Roan.  She remains under the care of Dr. Vivien Presto.  She will be going to PT for management of right achilles tendonosis.  She has no psoriasis at this time.  She uses triamcinolone cream topically as needed.    Activities of Daily Living:  Patient reports morning stiffness for all day. Patient Reports nocturnal pain.  Difficulty dressing/grooming: Reports Difficulty climbing stairs: Reports Difficulty getting out of chair: Reports Difficulty using hands for taps, buttons, cutlery, and/or writing: Reports  Review of Systems  Constitutional:  Positive for fatigue.  HENT:  Positive  for mouth dryness. Negative for mouth sores.   Eyes:  Positive for dryness.  Respiratory:  Negative for shortness of breath.   Cardiovascular:  Negative for chest pain and palpitations.  Gastrointestinal:  Positive for diarrhea. Negative for blood in stool and constipation.  Endocrine: Negative for increased urination.  Genitourinary:  Negative for involuntary urination.  Musculoskeletal:  Positive for joint pain, joint pain, joint swelling, morning stiffness and muscle tenderness. Negative for myalgias, muscle weakness and myalgias.  Skin:  Positive for rash and sensitivity to sunlight. Negative for color change and hair loss.  Allergic/Immunologic: Positive for susceptible to infections.  Neurological:  Positive for headaches. Negative for dizziness.  Hematological:  Negative for swollen glands.  Psychiatric/Behavioral:  Positive for sleep disturbance. Negative for depressed mood. The patient is nervous/anxious.     PMFS History:  Patient Active Problem List   Diagnosis Date Noted   Seizure ([place]) 11/03/2021   Diverticulitis of large intestine with abscess without bleeding 05/28/2021   History of pulmonary embolism 05/28/2021   Chronic anticoagulation 05/28/2021   Midline cystocele 05/23/2020   Pelvic mass 12/04/2019   DDD (degenerative disc disease), lumbar 01/21/2018   Primary osteoarthritis of both hands 01/21/2018   Primary osteoarthritis of both feet 01/21/2018   Psoriasis 12/01/2017   Psoriatic arthritis ([place]) 12/01/2017   High risk medication use 12/01/2017   S/P total knee replacement, 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   Thrombocytosis 12/01/2017   History of gastric  bypass 12/01/2017    Past Medical History:  Diagnosis Date   Chronic stable angina (HCC)    DDD (degenerative disc disease), lumbar    Intestinal abscess    Psoriatic arthritis ([place])    Pulmonary embolism ([place])    Bilateral lungs   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[place])    Spinal stenosis     Family History  Problem Relation  Age of Onset   Diabetes Mother    Hypertension Mother    Heart Problems Mother    Congestive Heart Failure Mother    Diabetes Father    Hypertension Father    Stroke Sister    Heart Problems Sister    Seizures Sister    Prostate cancer Brother    Diabetes Brother    Asthma Daughter    Healthy Daughter    Healthy Son    Healthy Son    Past Surgical History:  Procedure Laterality Date   BACK SURGERY  05/18/2019   L4 and L5   CHOLECYSTECTOMY     CYSTECTOMY  07/2017   JOINT REPLACEMENT Left    left knee   LAPAROSCOPIC ENDOMETRIOSIS FULGURATION  2011   LAPAROSCOPIC GASTRIC SLEEVE RESECTION  2018   LAPAROSCOPIC OOPHERECTOMY Right    LUMBAR EPIDURAL INJECTION  04/13/2018   REVISION TOTAL KNEE ARTHROPLASTY Left 12/2017   TOTAL SHOULDER ARTHROPLASTY Bilateral    TRIGGER FINGER RELEASE Bilateral 2023   bilateral 3rd digit   TUBAL LIGATION     postpartum   ULNAR NERVE REPAIR Bilateral 2023   Social History   Social History Narrative   Right handed    Lives with husband and 1 daughters    Immunization History  Administered Date(s) Administered   Influenza,inj,Quad PF,6+ Mos 04/15/2018   PFIZER(Purple Top)SARS-COV-2 Vaccination 07/15/2019, 08/05/2019, 03/13/2020   Pneumococcal Polysaccharide-23 04/15/2018   Zoster Recombinat (Shingrix) 05/13/2018, 10/25/2019     Objective: Vital Signs: BP 139/82 (BP Location: Left Wrist, Patient Position: Sitting, Cuff Size: Normal)   Pulse (!) 57   Resp 15   Ht '5\' 5"'$  (1.651 m)   Wt 267 lb 9.6 oz (121.4 kg)   BMI 44.53 kg/m    Physical Exam Vitals and nursing note reviewed.  Constitutional:      Appearance: She is well-developed.  HENT:     Head: Normocephalic and atraumatic.  Eyes:     Conjunctiva/sclera: Conjunctivae normal.  Cardiovascular:     Rate and Rhythm: Normal rate and regular rhythm.     Heart sounds: Normal heart sounds.  Pulmonary:     Effort: Pulmonary effort is normal.     Breath sounds: Normal breath  sounds.  Abdominal:     General: Bowel sounds are normal.     Palpations: Abdomen is soft.  Musculoskeletal:     Cervical back: Normal range of motion.  Skin:    General: Skin is warm and dry.     Capillary Refill: Capillary refill takes less than 2 seconds.  Neurological:     Mental Status: She is alert and oriented to person, place, and time.  Psychiatric:        Behavior: Behavior normal.      Musculoskeletal Exam: C-spine has good range of motion.  Thoracic and pulses noted.  Painful range of motion of the lumbar spine.  Midline spinal tenderness in the lumbar region.  No tenderness over SI joints.  Shoulder joints have good range of motion with some discomfort bilaterally.  Elbow joints have good range of motion with no  joint tenderness.  Wrist joints, MCPs, PIPs, DIPs have good range of motion with no synovitis.  Tenderness over both wrist joints but no synovitis noted.  Complete fist formation bilaterally.  Hip joints have good range of motion with no groin pain.  Left knee replacement has good range of motion with some warmth.  Painful range of motion was slightly limited extension of the right knee.  Ankle joints have good range of motion with no joint tenderness.  Right Achilles tendinitis noted.  CDAI Exam: CDAI Score: -- Patient Global: --; Provider Global: -- Swollen: --; Tender: -- Joint Exam 12/03/2021   No joint exam has been documented for this visit   There is currently no information documented on the homunculus. Go to the Rheumatology activity and complete the homunculus joint exam.  Investigation: No additional findings.  Imaging: Overnight EEG with video  Result Date: 11/04/2021 Lora Havens, MD     11/04/2021  9:37 AM Patient Name: Kortne All MRN: [id number] Epilepsy Attending: Lora Havens Referring Physician/Provider: Lora Havens, MD Duration: 11/03/2021 [phone] to 11/04/2021 3762 Patient history:  61 year old female with jerking episodes  during sleep admitted to epilepsy monitoring unit for characterization of these episodes. EEG to evaluate for seizure. Level of alertness: Awake, asleep AEDs during EEG study: GBP Technical aspects: This EEG study was done with scalp electrodes positioned according to the 10-20 International system of electrode placement. Electrical activity was acquired at a sampling rate of '500Hz'$  and reviewed with a high frequency filter of '70Hz'$  and a low frequency filter of '1Hz'$ . EEG data were recorded continuously and digitally stored. Description: The posterior dominant rhythm consists of 9 Hz activity of moderate voltage (25-35 uV) seen predominantly in posterior head regions, symmetric and reactive to eye opening and eye closing. Sleep was characterized by vertex waves, sleep spindles (12 to 14 Hz), maximal frontocentral region. Hyperventilation and photic stimulation were not performed.   IMPRESSION: This study is within normal limits. No seizures or epileptiform discharges were seen throughout the recording. Priyanka [name]    Recent Labs: Lab Results  Component Value Date   WBC 6.5 11/03/2021   HGB 11.1 (L) 11/03/2021   PLT 379 11/03/2021   NA 143 11/03/2021   K 3.8 11/03/2021   CL 105 11/03/2021   CO2 28 11/03/2021   GLUCOSE 122 (H) 11/03/2021   BUN 6 11/03/2021   CREATININE 0.72 11/03/2021   BILITOT 0.5 11/03/2021   ALKPHOS 121 11/03/2021   AST 24 11/03/2021   ALT 37 11/03/2021   PROT 6.7 11/03/2021   ALBUMIN 3.2 (L) 11/03/2021   CALCIUM 8.9 11/03/2021   GFRAA 103 10/08/2020   QFTBGOLDPLUS NEGATIVE 05/01/2020    Speciality Comments: Cosentyx start date:04/2018  Procedures:  No procedures performed Allergies: Aspirin and Tylenol [acetaminophen]    Assessment / Plan:     Visit Diagnoses: Psoriatic arthritis ([place]): She has no synovitis or dactylitis on examination today.  She has been experiencing significant pain in both hands as well as the right knee joint.  Her discomfort in the right  knee joint is due to underlying osteoarthritis.  No right knee warmth or effusion was noted today.  She had no synovitis in her wrist joints or hands at this time.  Discussed that if her symptoms persist or worsen I would recommend proceeding with an ultrasound of both hands to assess for synovitis.   Overall her psoriatic arthritis appears to be well-controlled on the current treatment regimen.  She has been  taking Otezla 30 mg 1 tablet by mouth twice daily and has been tolerating it overall.  She takes Imodium about once a week for diarrhea.  She has not noticed any worsening depression or suicidal ideation since initiating Otezla on 05/28/2021.  She has not had any recent or recurrent infections while taking Otezla.  She has no active psoriasis at this time.  No SI joint tenderness upon palpation.  She will be starting physical therapy for right Achilles tendinitis as ordered by Dr. Kennyth Arnold.   She will remain on Otezla 30 mg 1 tablet by mouth twice daily.  She was advised to notify us if her symptoms persist or worsen.  A referral to aquatic therapy was placed today to increase her activity level and muscle strengthening.  I also offered a referral to pain management but she would like to hold off at this time.  She will notify us if she would like to schedule ultrasound of both hands to assess for synovitis in the future.  She will follow-up in the office in 3 months or sooner if needed.  Psoriasis: No active psoriasis at this time.  She has a prescription for triamcinolone cream which she can apply topically as needed.  She will remain on Otezla as prescribed.  High risk medication use - Otezla 30 mg 1 tablet by mouth twice daily.  Sharyon Cable on 05/28/2021. CBC and CMP updated on 11/12/21.  Previous therapy includes: Cosentyx.   History of diverticulitis with sigmoid colon abscess.  Discussed the importance wanting to avoid the use of Biologics going forward if possible due to her risk for recurrent  infections.  She has not had any recent or recurrent infections.  Thrombocytosis: WNL on 11/12/21.   Primary osteoarthritis of both hands: She has been experiencing increased pain in both hands.  She has noticed intermittent joint swelling.  She had a left middle trigger finger release in spring 2023 watch alleviated the locking she was experiencing.  On examination today she had no synovitis or dactylitis.  She has some tenderness palpation over bilateral wrist joints but no inflammation was noted.  Discussed that if her symptoms persist or worsen I would recommend scheduling an ultrasound of both hands to assess for synovitis.  She would like to hold off on scheduling the ultrasound at this time.  I also offered a referral to pain management but she would like to hold off at this time.  Ulnar neuropathy of both upper extremities -S/p surgical release bilaterally-performed by Dr. French Ana.  Doing well.   S/P total knee replacement, left: Chronic pain.  She has good ROM with warmth but no effusion.   Primary osteoarthritis of right knee -She has been experiencing severe discomfort in the right knee joint.  No recent injury or fall.  On examination she has painful range of motion with slightly limited extension. No effusion noted. She had Visco gel injections performed in the right knee by her orthopedist in March 2023.  She had very minimal improvement in her joint pain.  She is considering proceeding with a right knee replacement in the future. A referral to aquatic therapy was placed today to increase her activity level as well as for lower extremity muscle strengthening. I also offered a referral to pain management but she would like to hold off at this time.  Primary osteoarthritis of both feet: She has good range of motion of both ankle joints on examination today.  No tenderness or synovitis noted.  She has  right Achilles tendinitis apparent on examination today.  She will be starting physical  therapy as ordered by Dr. Vivien Presto.  She was advised to notify us if her symptoms persist or worsen.   DDD (degenerative disc disease), lumbar: She is in experiencing increased discomfort in her lower back.  She plans on following up with Dr. Katherine Roan for further evaluation.  On examination today she has painful range of motion of the lumbar spine with midline spinal tenderness.  No SI joint tenderness upon palpation.  No symptoms of radiculopathy currently.  Her lower back pain is exacerbated by strenuous activities including painting, yard work, and moving heavy furniture.  She was encouraged to avoid excessive bending and stooping as well as to avoid lifting more than 10 pounds. A referral to aquatic therapy was placed today to increase her activity level.    Other medical conditions are listed as follows:  Chronic anticoagulation - On Eliquis. Discontinued aspirin. Hx of PE December 2022.  History of gastric bypass  Diverticulitis of large intestine with abscess without bleeding  History of pulmonary embolism  Orders: Orders Placed This Encounter  Procedures   Ambulatory referral to Physical Therapy   No orders of the defined types were placed in this encounter.     Follow-Up Instructions: Return in about 2 months (around 02/02/2022) for Psoriatic arthritis, Osteoarthritis.   Ofilia Neas, PA-C  Note - This record has been created using Dragon software.  Chart creation errors have been sought, but may not always  have been located. Such creation errors do not reflect on  the standard of medical care.

## 2021-11-27 NOTE — Telephone Encounter (Signed)
Rx for Otezla '30mg'$  twice daily sent to [facility] for hospital-based cost pricing.   Total qty remaining is  60 tabs    Knox Saliva, PharmD, MPH, BCPS, CPP Clinical Pharmacist (Rheumatology and Pulmonology)

## 2021-11-28 ENCOUNTER — Other Ambulatory Visit (HOSPITAL_COMMUNITY): Payer: Self-pay

## 2021-12-03 ENCOUNTER — Ambulatory Visit: Payer: Medicare Other | Attending: Physician Assistant | Admitting: Physician Assistant

## 2021-12-03 ENCOUNTER — Encounter: Payer: Self-pay | Admitting: Physician Assistant

## 2021-12-03 VITALS — BP 139/82 | HR 57 | Resp 15 | Ht 65.0 in | Wt 267.6 lb

## 2021-12-03 DIAGNOSIS — Z79899 Other long term (current) drug therapy: Secondary | ICD-10-CM

## 2021-12-03 DIAGNOSIS — L409 Psoriasis, unspecified: Secondary | ICD-10-CM

## 2021-12-03 DIAGNOSIS — Z7901 Long term (current) use of anticoagulants: Secondary | ICD-10-CM

## 2021-12-03 DIAGNOSIS — M19042 Primary osteoarthritis, left hand: Secondary | ICD-10-CM

## 2021-12-03 DIAGNOSIS — D75839 Thrombocytosis, unspecified: Secondary | ICD-10-CM

## 2021-12-03 DIAGNOSIS — M19071 Primary osteoarthritis, right ankle and foot: Secondary | ICD-10-CM

## 2021-12-03 DIAGNOSIS — M19072 Primary osteoarthritis, left ankle and foot: Secondary | ICD-10-CM

## 2021-12-03 DIAGNOSIS — M51369 Other intervertebral disc degeneration, lumbar region without mention of lumbar back pain or lower extremity pain: Secondary | ICD-10-CM

## 2021-12-03 DIAGNOSIS — M1711 Unilateral primary osteoarthritis, right knee: Secondary | ICD-10-CM

## 2021-12-03 DIAGNOSIS — Z96652 Presence of left artificial knee joint: Secondary | ICD-10-CM

## 2021-12-03 DIAGNOSIS — L4050 Arthropathic psoriasis, unspecified: Secondary | ICD-10-CM | POA: Diagnosis not present

## 2021-12-03 DIAGNOSIS — M5136 Other intervertebral disc degeneration, lumbar region: Secondary | ICD-10-CM

## 2021-12-03 DIAGNOSIS — Z9884 Bariatric surgery status: Secondary | ICD-10-CM

## 2021-12-03 DIAGNOSIS — G5623 Lesion of ulnar nerve, bilateral upper limbs: Secondary | ICD-10-CM

## 2021-12-03 DIAGNOSIS — M19041 Primary osteoarthritis, right hand: Secondary | ICD-10-CM

## 2021-12-03 DIAGNOSIS — Z86711 Personal history of pulmonary embolism: Secondary | ICD-10-CM

## 2021-12-03 DIAGNOSIS — K5720 Diverticulitis of large intestine with perforation and abscess without bleeding: Secondary | ICD-10-CM

## 2021-12-25 ENCOUNTER — Ambulatory Visit: Payer: Medicare Other | Admitting: Physical Therapy

## 2021-12-29 ENCOUNTER — Other Ambulatory Visit: Payer: Self-pay | Admitting: Rheumatology

## 2021-12-29 DIAGNOSIS — L409 Psoriasis, unspecified: Secondary | ICD-10-CM

## 2021-12-29 DIAGNOSIS — L4050 Arthropathic psoriasis, unspecified: Secondary | ICD-10-CM

## 2021-12-30 ENCOUNTER — Other Ambulatory Visit (HOSPITAL_COMMUNITY): Payer: Self-pay

## 2021-12-30 MED ORDER — OTEZLA 30 MG PO TABS
30.0000 mg | ORAL_TABLET | Freq: Two times a day (BID) | ORAL | 2 refills | Status: DC
  Filled 2021-12-30 (×2): qty 60, 30d supply, fill #0
  Filled 2022-01-28: qty 60, 30d supply, fill #1
  Filled 2022-02-25: qty 60, 30d supply, fill #2

## 2021-12-30 NOTE — Telephone Encounter (Signed)
Next Visit: 02/04/2022  Last Visit: 12/03/2021  Last Fill: 11/27/2021 (30 day supply)  DX: Psoriatic arthritis   Current Dose per office note 12/03/2021: Otezla 30 mg 1 tablet by mouth twice daily  Labs: 11/12/2021 Glucose 150, Alk. Phos 154, ALT 53, Hgb 11.7, MCHC 31.6  Okay to refill Otezla?

## 2022-01-01 ENCOUNTER — Ambulatory Visit: Payer: Medicare Other | Admitting: Physical Therapy

## 2022-01-02 ENCOUNTER — Other Ambulatory Visit (HOSPITAL_COMMUNITY): Payer: Self-pay

## 2022-01-05 ENCOUNTER — Encounter: Payer: Self-pay | Admitting: Physical Therapy

## 2022-01-05 ENCOUNTER — Ambulatory Visit: Payer: Medicare Other | Attending: Physician Assistant | Admitting: Physical Therapy

## 2022-01-05 ENCOUNTER — Other Ambulatory Visit: Payer: Self-pay

## 2022-01-05 DIAGNOSIS — G8929 Other chronic pain: Secondary | ICD-10-CM | POA: Diagnosis present

## 2022-01-05 DIAGNOSIS — R262 Difficulty in walking, not elsewhere classified: Secondary | ICD-10-CM | POA: Insufficient documentation

## 2022-01-05 DIAGNOSIS — M1711 Unilateral primary osteoarthritis, right knee: Secondary | ICD-10-CM | POA: Diagnosis not present

## 2022-01-05 DIAGNOSIS — M5459 Other low back pain: Secondary | ICD-10-CM | POA: Insufficient documentation

## 2022-01-05 DIAGNOSIS — M25561 Pain in right knee: Secondary | ICD-10-CM | POA: Diagnosis present

## 2022-01-05 DIAGNOSIS — M6281 Muscle weakness (generalized): Secondary | ICD-10-CM | POA: Diagnosis present

## 2022-01-05 DIAGNOSIS — R2689 Other abnormalities of gait and mobility: Secondary | ICD-10-CM | POA: Insufficient documentation

## 2022-01-05 DIAGNOSIS — M5136 Other intervertebral disc degeneration, lumbar region: Secondary | ICD-10-CM | POA: Insufficient documentation

## 2022-01-05 NOTE — Therapy (Signed)
OUTPATIENT PHYSICAL THERAPY EVALUATION   Patient Name: Connie Hoffman MRN: [id number] DOB:06/14/1960, 61 y.o., female Today's Date: 01/05/2022   PT End of Session - 01/05/22 1617     Visit Number 1    Date for PT Re-Evaluation 03/03/22    Authorization Type [employer] Medicare & Medicaid    PT Start Time 1617    PT Stop Time 1704    PT Time Calculation (min) 47 min    Activity Tolerance Patient tolerated treatment well;Patient limited by pain    Behavior During Therapy WFL for tasks assessed/performed             Past Medical History:  Diagnosis Date   Chronic stable angina (HCC)    DDD (degenerative disc disease), lumbar    Intestinal abscess    Psoriatic arthritis ([place])    Pulmonary embolism ([place])    Bilateral lungs   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[place])    Spinal stenosis    Past Surgical History:  Procedure Laterality Date   BACK SURGERY  05/18/2019   L4 and L5   CHOLECYSTECTOMY     CYSTECTOMY  07/2017   JOINT REPLACEMENT Left    left knee   LAPAROSCOPIC ENDOMETRIOSIS FULGURATION  2011   LAPAROSCOPIC GASTRIC SLEEVE RESECTION  2018   LAPAROSCOPIC OOPHERECTOMY Right    LUMBAR EPIDURAL INJECTION  04/13/2018   REVISION TOTAL KNEE ARTHROPLASTY Left 12/2017   TOTAL SHOULDER ARTHROPLASTY Bilateral    TRIGGER FINGER RELEASE Bilateral 2023   bilateral 3rd digit   TUBAL LIGATION     postpartum   ULNAR NERVE REPAIR Bilateral 2023   Patient Active Problem List   Diagnosis Date Noted   Seizure ([place]) 11/03/2021   Diverticulitis of large intestine with abscess without bleeding 05/28/2021   History of pulmonary embolism 05/28/2021   Chronic anticoagulation 05/28/2021   Midline cystocele 05/23/2020   Pelvic mass 12/04/2019   DDD (degenerative disc disease), lumbar 01/21/2018   Primary osteoarthritis of both hands 01/21/2018   Primary osteoarthritis of both feet 01/21/2018   Psoriasis 12/01/2017   Psoriatic arthritis ([place]) 12/01/2017   High risk medication use 12/01/2017    S/P total knee replacement, 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   Thrombocytosis 12/01/2017   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    PCP: Jenel Lucks, PA-C  REFERRING PROVIDER: Ofilia Neas, PA-C  REFERRING DIAG:  M17.11 (ICD-10-CM) - Primary osteoarthritis of right knee  M51.36 (ICD-10-CM) - DDD (degenerative disc disease), lumbar    RATIONALE FOR EVALUATION AND TREATMENT: Rehabilitation  THERAPY DIAG:  Other low back pain  Chronic pain of right knee  Muscle weakness (generalized)  Other abnormalities of gait and mobility  Difficulty in walking, not elsewhere classified  ONSET DATE: chronic  NEXT MD VISIT: 02/04/22   SUBJECTIVE:  SUBJECTIVE STATEMENT: Pt reports she is always in pain but tries to just deal with it. Lately she seems to be more tired with increased joint tenderness. She reports tendinitis in her R ankle which makes it hard for her to put a shoe on. MD gave her a boot to wear but she felt like it made her more likely to fall so she stopped wearing it. Her back pain started in 2017 but recently is starting to hurt like it had before surgery. Her R knee is stiff causing her to squirm to get comfortable. L knee also painful and lacks considerable flexion ROM s/p TKA despite MUA x 2.   PT order - Aquatic therapy: DDD lumbar spine and osteoarthritis of right knee.  PAIN:  Are you having pain? Yes: NPRS scale:  6/10 Pain location: B low back w/o radiculopathy Pain description: achy, constant Aggravating factors: bending, lifting Relieving factors: Icy-Hot, heating pad, ice, soak in a hot tub, muscle relaxant on a bad day  Are you having pain? Yes: NPRS scale:  4/10 Pain location: R anterior knee Pain description: achy, dull but intense Aggravating factors: moving her  knee Relieving factors: soak in hot tub, sit with pillow under her knee, heating pad (moist heat)  Are you having pain? Yes: NPRS scale:  7/10 Pain location: R Achilles tendon Pain description: burning, constant, tearing Aggravating factors: wearing a shoe, prolonged standing Relieving factors: warm soak in tub, ice    PERTINENT HISTORY: Back surgery L4 & L5; L TKA 07/2017 with revision 12/2017; B TSA; B ulnar nerve repair; Lumbar DDD and spinal stenosis; OA of multiple sites - hands, feet, knees, hips; psoriasis; psoriatic arthritis; h/o PE on chronic anticoagulation; h/o gastric bypass; diverticulitis; renal artery aneurysm; chronic stable angina  PRECAUTIONS: Fall  WEIGHT BEARING RESTRICTIONS: No  FALLS:  Has patient fallen in last 6 months? No - reports increased falls 1-2 yrs ago  LIVING ENVIRONMENT: Lives with: lives with their spouse and lives with their daughter Lives in: House/apartment Stairs: No Has following equipment at home: Lobbyist, Environmental consultant - 2 wheeled, and bed side commode  OCCUPATION: Disabled  PLOF: Independent and Leisure: painting, cleaning, gardening, home DIY projects, reading  PATIENT GOALS: "More tolerance for standing/walking and better mobility."   OBJECTIVE:   DIAGNOSTIC FINDINGS:  12/05/21 - Lumbar spine x-ray:  Multilevel degenerative changes of the lumbar spine.  Interlaminar fixation L4/L5. Vertebral body heights maintained.  No acute fracture. No subluxation. Moderate disc space height loss L4/L5 and L5/S1. Mild to moderate disc space height loss L3/L4. Moderate multilevel facet degenerative changes of the lumbar spine. Bilateral hip osteoarthritis. Atherosclerosis.  04/11/20 - Lumbar MRI:   - Interval surgery at L4-L5 with placement of a intraspinous device. There is no significant spinal stenosis.  - There is a moderate stenosis of the right neural foramen at L4-5 and moderate stenosis of the left neural foramen at L5-S1 which are  unchanged.    04/11/22 - R knee MRI:  - Chronic tear, maceration, and extrusion of the medial meniscus.   - Bone-on-bone medial compartment arthrosis, potentially crystalline or inflammatory in etiology, with numerous prominent subchondral osseous erosions, most pronounced in the central medial tibial plateau. Extensive cartilage degenerative changes as above. - Small chondroid lesion in the distal femur likely representing enchondroma despite the fact that it extends to the anterior endosteal margin.  - Mild radial free edge degeneration of the lateral meniscus.  - Degenerative changes of the ACL, impinged upon by  notch osteophytes.    12/21/19 - R knee x-ray: Tricompartmental degenerative changes.  PATIENT SURVEYS:  Modified Oswestry 26/50 = 52.0% - severe disability  LEFS 34/80 = 42.5%  SCREENING FOR RED FLAGS: Bowel or bladder incontinence: No Spinal tumors: No Cauda equina syndrome: No Compression fracture: No Abdominal aneurysm: No  COGNITION:  Overall cognitive status: Within functional limits for tasks assessed     SENSATION: WFL  MUSCLE LENGTH: Hamstrings: WFL ITB: mild tight B Piriformis: mod tight B Hip flexors: mild/mod tight B Quads: mod tight R; limited by knee ROM on L Heelcord: mod tight   POSTURE:  weight shift right  PALPATION: TTP with increase muscle tension in B lumbar paraspinals, glutes and piriformis  LUMBAR ROM:   Active  A/PROM  eval  Flexion Hands to ankles  Extension 25% limited  Right lateral flexion Hand to lateral knee  Left lateral flexion Hand to lateral knee  Right rotation 40% limited  Left rotation 40% limited   (Blank rows = not tested)  LOWER EXTREMITY ROM:     Active  Right eval Left eval  Hip flexion    Hip extension    Hip abduction    Hip adduction    Hip internal rotation    Hip external rotation    Knee flexion 93 seated 97 supine 44 seated & supine  Knee extension 11 seated 0 supine 3 seated 1 supine   Ankle dorsiflexion    Ankle plantarflexion    Ankle inversion    Ankle eversion     (Blank rows = not tested)  LOWER EXTREMITY MMT:    MMT Right eval Left eval  Hip flexion 4 4-  Hip extension 4- 4  Hip abduction 3+ 4-  Hip adduction 4- 3-  Hip internal rotation    Hip external rotation    Knee flexion 3+ 4-  Knee extension 4 3+  Ankle dorsiflexion 4+ 3-  Ankle plantarflexion    Ankle inversion    Ankle eversion     (Blank rows = not tested)  LUMBAR SPECIAL TESTS:  Straight leg raise test: Negative  FUNCTIONAL TESTS:  10 meter walk test: 15.50 sec Dynamic Gait Index: TBA next visit  GAIT: Distance walked: 60 ft Assistive device utilized: None Level of assistance: SBA Gait pattern: step through pattern, decreased step length- Right, decreased step length- Left, decreased stance time- Left, decreased stride length, decreased hip/knee flexion- Right, decreased hip/knee flexion- Left, decreased ankle dorsiflexion- Right, lateral lean- Right, and wide BOS Comments: gait speed = 2.12 ft/sec   TODAY'S TREATMENT:  01/05/22 Eval only   PATIENT EDUCATION:  Education details: PT eval findings, anticipated POC, and need for further assessment of balance Person educated: Patient Education method: Explanation Education comprehension: verbalized understanding   HOME EXERCISE PROGRAM: TBD  ASSESSMENT:  CLINICAL IMPRESSION: Connie Hoffman is a 61 y.o. female who was seen today for physical therapy evaluation and treatment for aquatic therapy for DDD of the lumbar spine and OA of the R knee resulting in chronic pain. She has an extensive medical history with several other active problems contributing to her low back and R knee pain, including but not limited to R Achilles tendinitis and a L knee contracture following limiting her L knee flexion ROM to 44, as well as OA and psoriatic arthritis with chronic pain in several other joints. Deficits include chronic pain,  limited lumbar and R knee AROM, abnormal muscle tension, impaired flexibility, along with core and B LE weakness. Deficits  contribute to impaired mobility and abnormal gait pattern with decreased activity and positional tolerance. Further testing for balance to be completed next visit along with establishment of an initial HEP. Connie Hoffman will benefit from skilled PT with primary emphasis on aquatic PT utilizing the buoyancy and hydrostatic pressure of water for support and to offload joints along with the viscosity of the water for resistance of strengthening and water current perturbations to provide challenge to standing balance for increased core activation.   OBJECTIVE IMPAIRMENTS: Abnormal gait, decreased activity tolerance, decreased balance, decreased endurance, decreased knowledge of condition, decreased mobility, difficulty walking, decreased ROM, decreased strength, decreased safety awareness, increased fascial restrictions, impaired perceived functional ability, increased muscle spasms, impaired flexibility, improper body mechanics, postural dysfunction, and pain.   ACTIVITY LIMITATIONS: carrying, lifting, bending, sitting, standing, squatting, sleeping, stairs, transfers, bed mobility, bathing, dressing, locomotion level, and caring for others  PARTICIPATION LIMITATIONS: meal prep, cleaning, laundry, medication management, interpersonal relationship, driving, shopping, community activity, yard work, and church  PERSONAL FACTORS: Fitness, Past/current experiences, Time since onset of injury/illness/exacerbation, 3+ comorbidities: Back surgery L4 & L5; L TKA 07/2017 with revision 12/2017; B TSA; B ulnar nerve repair; Lumbar DDD and spinal stenosis; OA of multiple sites - hands, feet, knees, hips; psoriasis; psoriatic arthritis; h/o PE on chronic anticoagulation; h/o gastric bypass; diverticulitis; renal artery aneurysm; chronic stable angina, and chronic disability  are also affecting patient's  functional outcome.   REHAB POTENTIAL: Good  CLINICAL DECISION MAKING: Evolving/moderate complexity  EVALUATION COMPLEXITY: Moderate   GOALS: Goals reviewed with patient? Yes  SHORT TERM GOALS: Target date: 01/26/2022   Patient will be independent with initial HEP to improve outcomes and carryover.  Baseline:  Goal status: INITIAL  LONG TERM GOALS: Target date: 03/03/2022    Patient will be independent with ongoing/advanced HEP for self-management at home.  Baseline:  Goal status: INITIAL  2.  Patient will report 50-75% improvement in low back pain to improve QOL.  Baseline:  Goal status: INITIAL  3.  Patient will report at least 50-75% improvement in R knee pain to improve ambulation tolerance. Baseline:  Goal status: INITIAL  4.  Patient to demonstrate ability to achieve and maintain good spinal alignment/posturing and body mechanics needed for daily activities. Baseline:  Goal status: INITIAL  5.  Patient will demonstrate functional lumbar ROM w/o increased pain to perform ADLs.   Baseline:  Goal status: INITIAL  6.  Patient will demonstrate improved R knee AROM to >/= 0-105 deg to allow for normal gait and stair mechanics. Baseline:  Goal status: INITIAL  7.  Patient will demonstrate improved B strength to >/= 4+/5 for improved stability and ease of mobility . Baseline:  Goal status: INITIAL  8.  Patient will tolerate >/= 20-30 min of standing or walking to perform chores, cooking, or grocery shopping . Baseline:  Goal status: INITIAL  9.  Patient will demonstrate at least 19/24 on DGI to decrease risk of falls. Baseline:  Goal status: INITIAL   10.  Patient will report </= 44% on Modified Oswestry to demonstrate improved functional ability with decreased limitation due to lumbar DDD. Baseline: Modified Oswestry 26/50 = 52.0% - severe disability Goal status: INITIAL  11.  Patient will report 43/80 on LEFS to demonstrate improved functional ability with  decreased limitation due to R knee OA. Baseline: LEFS 34/80 = 42.5% Goal status: INITIAL    PLAN: PT FREQUENCY: 2x/week  PT DURATION: 8 weeks  PLANNED INTERVENTIONS: Therapeutic exercises, Therapeutic activity, Neuromuscular re-education, Balance  training, Gait training, Patient/Family education, Self Care, Joint mobilization, Stair training, Aquatic Therapy, Dry Needling, Electrical stimulation, Cryotherapy, Moist heat, Taping, Vasopneumatic device, Ultrasound, Ionotophoresis '4mg'$ /ml Dexamethasone, Manual therapy, and Re-evaluation.  PLAN FOR NEXT SESSION: Balance testing - DGI; Create initial [place], PT 01/05/2022, 6:35 PM

## 2022-01-08 ENCOUNTER — Ambulatory Visit: Payer: Medicare Other | Admitting: Physical Therapy

## 2022-01-12 ENCOUNTER — Ambulatory Visit: Payer: Medicare Other

## 2022-01-12 DIAGNOSIS — M5459 Other low back pain: Secondary | ICD-10-CM | POA: Diagnosis not present

## 2022-01-12 DIAGNOSIS — R262 Difficulty in walking, not elsewhere classified: Secondary | ICD-10-CM

## 2022-01-12 DIAGNOSIS — R2689 Other abnormalities of gait and mobility: Secondary | ICD-10-CM

## 2022-01-12 DIAGNOSIS — M6281 Muscle weakness (generalized): Secondary | ICD-10-CM

## 2022-01-12 DIAGNOSIS — G8929 Other chronic pain: Secondary | ICD-10-CM

## 2022-01-12 NOTE — Therapy (Signed)
OUTPATIENT PHYSICAL THERAPY TREATMENT   Patient Name: Connie Hoffman MRN: [id number] DOB:03-Jun-1960, 61 y.o., female Today's Date: 01/12/2022   PT End of Session - 01/12/22 1532     Visit Number 2    Date for PT Re-Evaluation 03/03/22    Authorization Type [employer] Medicare & Medicaid    PT Start Time 1450   pt late   PT Stop Time 1308    PT Time Calculation (min) 40 min    Activity Tolerance Patient tolerated treatment well    Behavior During Therapy WFL for tasks assessed/performed              Past Medical History:  Diagnosis Date   Chronic stable angina ([place])    DDD (degenerative disc disease), lumbar    Intestinal abscess    Psoriatic arthritis ([place])    Pulmonary embolism ([address])    Bilateral lungs   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[place])    Spinal stenosis    Past Surgical History:  Procedure Laterality Date   BACK SURGERY  05/18/2019   L4 and L5   CHOLECYSTECTOMY     CYSTECTOMY  07/2017   JOINT REPLACEMENT Left    left knee   LAPAROSCOPIC ENDOMETRIOSIS FULGURATION  2011   LAPAROSCOPIC GASTRIC SLEEVE RESECTION  2018   LAPAROSCOPIC OOPHERECTOMY Right    LUMBAR EPIDURAL INJECTION  04/13/2018   REVISION TOTAL KNEE ARTHROPLASTY Left 12/2017   TOTAL SHOULDER ARTHROPLASTY Bilateral    TRIGGER FINGER RELEASE Bilateral 2023   bilateral 3rd digit   TUBAL LIGATION     postpartum   ULNAR NERVE REPAIR Bilateral 2023   Patient Active Problem List   Diagnosis Date Noted   Seizure ([place]) 11/03/2021   Diverticulitis of large intestine with abscess without bleeding 05/28/2021   History of pulmonary embolism 05/28/2021   Chronic anticoagulation 05/28/2021   Midline cystocele 05/23/2020   Pelvic mass 12/04/2019   DDD (degenerative disc disease), lumbar 01/21/2018   Primary osteoarthritis of both hands 01/21/2018   Primary osteoarthritis of both feet 01/21/2018   Psoriasis 12/01/2017   Psoriatic arthritis ([place]) 12/01/2017   High risk medication use 12/01/2017   S/P total knee  replacement, 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   Thrombocytosis 12/01/2017   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    PCP: Jenel Lucks, PA-C  REFERRING PROVIDER: Ofilia Neas, PA-C  REFERRING DIAG:  M17.11 (ICD-10-CM) - Primary osteoarthritis of right knee  M51.36 (ICD-10-CM) - DDD (degenerative disc disease), lumbar    RATIONALE FOR EVALUATION AND TREATMENT: Rehabilitation  THERAPY DIAG:  Other low back pain  Chronic pain of right knee  Muscle weakness (generalized)  Other abnormalities of gait and mobility  Difficulty in walking, not elsewhere classified  ONSET DATE: chronic  NEXT MD VISIT: 02/04/22   SUBJECTIVE:  SUBJECTIVE STATEMENT: Pt reports going on a walk over the weekend, went well, does little chores around the house but afterwards has a lot of soreness.  PT order - Aquatic therapy: DDD lumbar spine and osteoarthritis of right knee.  PAIN:  Are you having pain? Yes: NPRS scale: 7/10 Pain location: B low back w/o radiculopathy Pain description: achy, constant Aggravating factors: bending, lifting Relieving factors: Icy-Hot, heating pad, ice, soak in a hot tub, muscle relaxant on a bad day  Are you having pain? Yes: NPRS scale:  6/10 Pain location: R anterior knee Pain description: achy, dull but intense Aggravating factors: moving her knee Relieving factors: soak in hot tub, sit with pillow under her knee, heating pad (moist heat)  Are you having pain? No    PERTINENT HISTORY: Back surgery L4 & L5; L TKA 07/2017 with revision 12/2017; B TSA; B ulnar nerve repair; Lumbar DDD and spinal stenosis; OA of multiple sites - hands, feet, knees, hips; psoriasis; psoriatic arthritis; h/o PE on chronic anticoagulation; h/o gastric bypass; diverticulitis; renal artery aneurysm;  chronic stable angina  PRECAUTIONS: Fall  WEIGHT BEARING RESTRICTIONS: No  FALLS:  Has patient fallen in last 6 months? No - reports increased falls 1-2 yrs ago  LIVING ENVIRONMENT: Lives with: lives with their spouse and lives with their daughter Lives in: House/apartment Stairs: No Has following equipment at home: Lobbyist, Environmental consultant - 2 wheeled, and bed side commode  OCCUPATION: Disabled  PLOF: Independent and Leisure: painting, cleaning, gardening, home DIY projects, reading  PATIENT GOALS: "More tolerance for standing/walking and better mobility."   OBJECTIVE:   DIAGNOSTIC FINDINGS:  12/05/21 - Lumbar spine x-ray:  Multilevel degenerative changes of the lumbar spine.  Interlaminar fixation L4/L5. Vertebral body heights maintained.  No acute fracture. No subluxation. Moderate disc space height loss L4/L5 and L5/S1. Mild to moderate disc space height loss L3/L4. Moderate multilevel facet degenerative changes of the lumbar spine. Bilateral hip osteoarthritis. Atherosclerosis.  04/11/20 - Lumbar MRI:   - Interval surgery at L4-L5 with placement of a intraspinous device. There is no significant spinal stenosis.  - There is a moderate stenosis of the right neural foramen at L4-5 and moderate stenosis of the left neural foramen at L5-S1 which are unchanged.    04/11/22 - R knee MRI:  - Chronic tear, maceration, and extrusion of the medial meniscus.   - Bone-on-bone medial compartment arthrosis, potentially crystalline or inflammatory in etiology, with numerous prominent subchondral osseous erosions, most pronounced in the central medial tibial plateau. Extensive cartilage degenerative changes as above. - Small chondroid lesion in the distal femur likely representing enchondroma despite the fact that it extends to the anterior endosteal margin.  - Mild radial free edge degeneration of the lateral meniscus.  - Degenerative changes of the ACL, impinged upon by notch  osteophytes.    12/21/19 - R knee x-ray: Tricompartmental degenerative changes.  PATIENT SURVEYS:  Modified Oswestry 26/50 = 52.0% - severe disability  LEFS 34/80 = 42.5%  SCREENING FOR RED FLAGS: Bowel or bladder incontinence: No Spinal tumors: No Cauda equina syndrome: No Compression fracture: No Abdominal aneurysm: No  COGNITION:  Overall cognitive status: Within functional limits for tasks assessed     SENSATION: WFL  MUSCLE LENGTH: Hamstrings: WFL ITB: mild tight B Piriformis: mod tight B Hip flexors: mild/mod tight B Quads: mod tight R; limited by knee ROM on L Heelcord: mod tight   POSTURE:  weight shift right  PALPATION: TTP with increase muscle tension  in B lumbar paraspinals, glutes and piriformis  LUMBAR ROM:   Active  A/PROM  eval  Flexion Hands to ankles  Extension 25% limited  Right lateral flexion Hand to lateral knee  Left lateral flexion Hand to lateral knee  Right rotation 40% limited  Left rotation 40% limited   (Blank rows = not tested)  LOWER EXTREMITY ROM:     Active  Right eval Left eval  Hip flexion    Hip extension    Hip abduction    Hip adduction    Hip internal rotation    Hip external rotation    Knee flexion 93 seated 97 supine 44 seated & supine  Knee extension 11 seated 0 supine 3 seated 1 supine  Ankle dorsiflexion    Ankle plantarflexion    Ankle inversion    Ankle eversion     (Blank rows = not tested)  LOWER EXTREMITY MMT:    MMT Right eval Left eval  Hip flexion 4 4-  Hip extension 4- 4  Hip abduction 3+ 4-  Hip adduction 4- 3-  Hip internal rotation    Hip external rotation    Knee flexion 3+ 4-  Knee extension 4 3+  Ankle dorsiflexion 4+ 3-  Ankle plantarflexion    Ankle inversion    Ankle eversion     (Blank rows = not tested)  LUMBAR SPECIAL TESTS:  Straight leg raise test: Negative  FUNCTIONAL TESTS:  10 meter walk test: 15.50 sec Dynamic Gait Index: TBA next visit  GAIT: Distance  walked: 60 ft Assistive device utilized: None Level of assistance: SBA Gait pattern: step through pattern, decreased step length- Right, decreased step length- Left, decreased stance time- Left, decreased stride length, decreased hip/knee flexion- Right, decreased hip/knee flexion- Left, decreased ankle dorsiflexion- Right, lateral lean- Right, and wide BOS Comments: gait speed = 2.12 ft/sec   TODAY'S TREATMENT: 01/12/22 Therapeutic Exercise: DGI: 15/24 Seated hamstring stretch 2x30 sec bil Supine thomas stretch 2x30 sec bil SKTC stretch 2x30 sec bil Seated lunge stretch for hip flexors x 30 sec bil Standing church pew x 10 Standing heel/toe raise x 10  01/05/22 Eval only   PATIENT EDUCATION:  Education details: initial HEP Person educated: Patient Education method: Consulting civil engineer, Media planner, and Handouts Education comprehension: verbalized understanding and returned demonstration   HOME EXERCISE PROGRAM: Access Code: [email or web address] URL: [email or web address] Date: 01/12/2022 Prepared by: Clarene Essex  Exercises - Modified Thomas Stretch  - 1 x daily - 7 x weekly - 3 sets - 30 sec hold - Supine Lower Trunk Rotation  - 1 x daily - 7 x weekly - 3 sets - 10 reps - Supine Single Knee to Chest Stretch  - 1 x daily - 7 x weekly - 3 sets - 10 reps - Heel Toe Raises with Counter Support  - 1 x daily - 7 x weekly - 3 sets - 10 reps  ASSESSMENT:  CLINICAL IMPRESSION: Pt demonstrated a good response to treatment w/ no increased pain. Based DGI score she demonstrates fall risk, with slow gait velocity. We were unable to do piriformis stretch d/t limited L knee flexion. Established initial HEP based tightness found and symptoms noted by patient. Pt demonstrates a good amount of tightness in the hip flexors. Finished session with no issues, continue progressing balance and hip strengthening.  OBJECTIVE IMPAIRMENTS: Abnormal gait, decreased activity tolerance, decreased  balance, decreased endurance, decreased knowledge of condition, decreased mobility, difficulty walking, decreased ROM, decreased strength, decreased safety awareness, increased fascial restrictions,  impaired perceived functional ability, increased muscle spasms, impaired flexibility, improper body mechanics, postural dysfunction, and pain.   ACTIVITY LIMITATIONS: carrying, lifting, bending, sitting, standing, squatting, sleeping, stairs, transfers, bed mobility, bathing, dressing, locomotion level, and caring for others  PARTICIPATION LIMITATIONS: meal prep, cleaning, laundry, medication management, interpersonal relationship, driving, shopping, community activity, yard work, and church  PERSONAL FACTORS: Fitness, Past/current experiences, Time since onset of injury/illness/exacerbation, 3+ comorbidities: Back surgery L4 & L5; L TKA 07/2017 with revision 12/2017; B TSA; B ulnar nerve repair; Lumbar DDD and spinal stenosis; OA of multiple sites - hands, feet, knees, hips; psoriasis; psoriatic arthritis; h/o PE on chronic anticoagulation; h/o gastric bypass; diverticulitis; renal artery aneurysm; chronic stable angina, and chronic disability  are also affecting patient's functional outcome.   REHAB POTENTIAL: Good  CLINICAL DECISION MAKING: Evolving/moderate complexity  EVALUATION COMPLEXITY: Moderate   GOALS: Goals reviewed with patient? Yes  SHORT TERM GOALS: Target date: 01/26/2022   Patient will be independent with initial HEP to improve outcomes and carryover.  Baseline:  Goal status: IN PROGRESS  LONG TERM GOALS: Target date: 03/03/2022    Patient will be independent with ongoing/advanced HEP for self-management at home.  Baseline:  Goal status: IN PROGRESS  2.  Patient will report 50-75% improvement in low back pain to improve QOL.  Baseline:  Goal status: IN PROGRESS  3.  Patient will report at least 50-75% improvement in R knee pain to improve ambulation tolerance. Baseline:   Goal status: IN PROGRESS  4.  Patient to demonstrate ability to achieve and maintain good spinal alignment/posturing and body mechanics needed for daily activities. Baseline:  Goal status: IN PROGRESS  5.  Patient will demonstrate functional lumbar ROM w/o increased pain to perform ADLs.   Baseline:  Goal status: IN PROGRESS  6.  Patient will demonstrate improved R knee AROM to >/= 0-105 deg to allow for normal gait and stair mechanics. Baseline:  Goal status: IN PROGRESS  7.  Patient will demonstrate improved B strength to >/= 4+/5 for improved stability and ease of mobility . Baseline:  Goal status: IN PROGRESS  8.  Patient will tolerate >/= 20-30 min of standing or walking to perform chores, cooking, or grocery shopping . Baseline:  Goal status: IN PROGRESS  9.  Patient will demonstrate at least 19/24 on DGI to decrease risk of falls. Baseline:  Goal status: IN PROGRESS   10.  Patient will report </= 44% on Modified Oswestry to demonstrate improved functional ability with decreased limitation due to lumbar DDD. Baseline: Modified Oswestry 26/50 = 52.0% - severe disability Goal status: IN PROGRESS  11.  Patient will report 43/80 on LEFS to demonstrate improved functional ability with decreased limitation due to R knee OA. Baseline: LEFS 34/80 = 42.5% Goal status: IN PROGRESS    PLAN: PT FREQUENCY: 2x/week  PT DURATION: 8 weeks  PLANNED INTERVENTIONS: Therapeutic exercises, Therapeutic activity, Neuromuscular re-education, Balance training, Gait training, Patient/Family education, Self Care, Joint mobilization, Stair training, Aquatic Therapy, Dry Needling, Electrical stimulation, Cryotherapy, Moist heat, Taping, Vasopneumatic device, Ultrasound, Ionotophoresis '4mg'$ /ml Dexamethasone, Manual therapy, and Re-evaluation.  PLAN FOR NEXT SESSION: review HEP; progress hip strengthening and core stability   Artist Pais, PTA 01/12/2022, 3:33 PM

## 2022-01-16 ENCOUNTER — Ambulatory Visit (HOSPITAL_BASED_OUTPATIENT_CLINIC_OR_DEPARTMENT_OTHER): Payer: Medicare Other | Admitting: Physical Therapy

## 2022-01-20 ENCOUNTER — Ambulatory Visit (HOSPITAL_BASED_OUTPATIENT_CLINIC_OR_DEPARTMENT_OTHER): Payer: Medicare Other | Attending: Physician Assistant | Admitting: Physical Therapy

## 2022-01-20 ENCOUNTER — Encounter (HOSPITAL_BASED_OUTPATIENT_CLINIC_OR_DEPARTMENT_OTHER): Payer: Self-pay | Admitting: Physical Therapy

## 2022-01-20 DIAGNOSIS — R262 Difficulty in walking, not elsewhere classified: Secondary | ICD-10-CM | POA: Diagnosis present

## 2022-01-20 DIAGNOSIS — M25561 Pain in right knee: Secondary | ICD-10-CM | POA: Diagnosis present

## 2022-01-20 DIAGNOSIS — M5459 Other low back pain: Secondary | ICD-10-CM | POA: Diagnosis present

## 2022-01-20 DIAGNOSIS — R2689 Other abnormalities of gait and mobility: Secondary | ICD-10-CM | POA: Diagnosis present

## 2022-01-20 DIAGNOSIS — M6281 Muscle weakness (generalized): Secondary | ICD-10-CM | POA: Insufficient documentation

## 2022-01-20 DIAGNOSIS — G8929 Other chronic pain: Secondary | ICD-10-CM | POA: Insufficient documentation

## 2022-01-20 NOTE — Therapy (Signed)
OUTPATIENT PHYSICAL THERAPY TREATMENT   Patient Name: Connie Hoffman MRN: [id number] DOB:May 03, 1960, 61 y.o., female Today's Date: 01/20/2022   PT End of Session - 01/20/22 1403     Visit Number 3    Date for PT Re-Evaluation 03/03/22    Authorization Type UHC Medicare & Medicaid    PT Start Time 1401    PT Stop Time 1440    PT Time Calculation (min) 39 min    Activity Tolerance Patient tolerated treatment well    Behavior During Therapy WFL for tasks assessed/performed              Past Medical History:  Diagnosis Date   Chronic stable angina (HCC)    DDD (degenerative disc disease), lumbar    Intestinal abscess    Psoriatic arthritis ([place])    Pulmonary embolism ([place])    Bilateral lungs   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[place])    Spinal stenosis    Past Surgical History:  Procedure Laterality Date   BACK SURGERY  05/18/2019   L4 and L5   CHOLECYSTECTOMY     CYSTECTOMY  07/2017   JOINT REPLACEMENT Left    left knee   LAPAROSCOPIC ENDOMETRIOSIS FULGURATION  2011   LAPAROSCOPIC GASTRIC SLEEVE RESECTION  2018   LAPAROSCOPIC OOPHERECTOMY Right    LUMBAR EPIDURAL INJECTION  04/13/2018   REVISION TOTAL KNEE ARTHROPLASTY Left 12/2017   TOTAL SHOULDER ARTHROPLASTY Bilateral    TRIGGER FINGER RELEASE Bilateral 2023   bilateral 3rd digit   TUBAL LIGATION     postpartum   ULNAR NERVE REPAIR Bilateral 2023   Patient Active Problem List   Diagnosis Date Noted   Seizure ([place]) 11/03/2021   Diverticulitis of large intestine with abscess without bleeding 05/28/2021   History of pulmonary embolism 05/28/2021   Chronic anticoagulation 05/28/2021   Midline cystocele 05/23/2020   Pelvic mass 12/04/2019   DDD (degenerative disc disease), lumbar 01/21/2018   Primary osteoarthritis of both hands 01/21/2018   Primary osteoarthritis of both feet 01/21/2018   Psoriasis 12/01/2017   Psoriatic arthritis ([place]) 12/01/2017   High risk medication use 12/01/2017   S/P total knee  replacement, 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   Thrombocytosis 12/01/2017   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    PCP: Jenel Lucks, PA-C  REFERRING PROVIDER: Ofilia Neas, PA-C  REFERRING DIAG:  M17.11 (ICD-10-CM) - Primary osteoarthritis of right knee  M51.36 (ICD-10-CM) - DDD (degenerative disc disease), lumbar    RATIONALE FOR EVALUATION AND TREATMENT: Rehabilitation  THERAPY DIAG:  Other low back pain  Chronic pain of right knee  Muscle weakness (generalized)  Other abnormalities of gait and mobility  Difficulty in walking, not elsewhere classified  ONSET DATE: chronic  NEXT MD VISIT: 02/04/22   SUBJECTIVE:  SUBJECTIVE STATEMENT: Pt reports she did "pretty good" after last session.  No other changes.   PT order - Aquatic therapy: DDD lumbar spine and osteoarthritis of right knee.  PAIN:  Are you having pain? Yes: NPRS scale: 4/10 Pain location: B low back w/o radiculopathy ;hands; R ankle Pain description: achy, constant Aggravating factors: bending, lifting Relieving factors: Icy-Hot, heating pad, ice, soak in a hot tub, muscle relaxant on a bad day    PERTINENT HISTORY: Back surgery L4 & L5- 2021; L TKA 07/2017 with revision 12/2017; B TSA; B ulnar nerve repair; Lumbar DDD and spinal stenosis; OA of multiple sites - hands, feet, knees, hips; psoriasis; psoriatic arthritis; h/o PE on chronic anticoagulation; h/o gastric bypass; diverticulitis; renal artery aneurysm; chronic stable angina  PRECAUTIONS: Fall  WEIGHT BEARING RESTRICTIONS: No  FALLS:  Has patient fallen in last 6 months? No - reports increased falls 1-2 yrs ago  LIVING ENVIRONMENT: Lives with: lives with their spouse and lives with their daughter Lives in: House/apartment Stairs: No Has following equipment  at home: Lobbyist, Environmental consultant - 2 wheeled, and bed side commode  OCCUPATION: Disabled  PLOF: Independent and Leisure: painting, cleaning, gardening, home DIY projects, reading  PATIENT GOALS: "More tolerance for standing/walking and better mobility."   OBJECTIVE:   DIAGNOSTIC FINDINGS:  12/05/21 - Lumbar spine x-ray:  Multilevel degenerative changes of the lumbar spine.  Interlaminar fixation L4/L5. Vertebral body heights maintained.  No acute fracture. No subluxation. Moderate disc space height loss L4/L5 and L5/S1. Mild to moderate disc space height loss L3/L4. Moderate multilevel facet degenerative changes of the lumbar spine. Bilateral hip osteoarthritis. Atherosclerosis.  04/11/20 - Lumbar MRI:   - Interval surgery at L4-L5 with placement of a intraspinous device. There is no significant spinal stenosis.  - There is a moderate stenosis of the right neural foramen at L4-5 and moderate stenosis of the left neural foramen at L5-S1 which are unchanged.    04/11/22 - R knee MRI:  - Chronic tear, maceration, and extrusion of the medial meniscus.   - Bone-on-bone medial compartment arthrosis, potentially crystalline or inflammatory in etiology, with numerous prominent subchondral osseous erosions, most pronounced in the central medial tibial plateau. Extensive cartilage degenerative changes as above. - Small chondroid lesion in the distal femur likely representing enchondroma despite the fact that it extends to the anterior endosteal margin.  - Mild radial free edge degeneration of the lateral meniscus.  - Degenerative changes of the ACL, impinged upon by notch osteophytes.    12/21/19 - R knee x-ray: Tricompartmental degenerative changes.  PATIENT SURVEYS:  Modified Oswestry 26/50 = 52.0% - severe disability  LEFS 34/80 = 42.5%  SCREENING FOR RED FLAGS: Bowel or bladder incontinence: No Spinal tumors: No Cauda equina syndrome: No Compression fracture: No Abdominal aneurysm:  No  COGNITION:  Overall cognitive status: Within functional limits for tasks assessed     SENSATION: WFL  MUSCLE LENGTH: Hamstrings: WFL ITB: mild tight B Piriformis: mod tight B Hip flexors: mild/mod tight B Quads: mod tight R; limited by knee ROM on L Heelcord: mod tight   POSTURE:  weight shift right  PALPATION: TTP with increase muscle tension in B lumbar paraspinals, glutes and piriformis  LUMBAR ROM:   Active  A/PROM  eval  Flexion Hands to ankles  Extension 25% limited  Right lateral flexion Hand to lateral knee  Left lateral flexion Hand to lateral knee  Right rotation 40% limited  Left rotation 40% limited   (  Blank rows = not tested)  LOWER EXTREMITY ROM:     Active  Right eval Left eval  Hip flexion    Hip extension    Hip abduction    Hip adduction    Hip internal rotation    Hip external rotation    Knee flexion 93 seated 97 supine 44 seated & supine  Knee extension 11 seated 0 supine 3 seated 1 supine  Ankle dorsiflexion    Ankle plantarflexion    Ankle inversion    Ankle eversion     (Blank rows = not tested)  LOWER EXTREMITY MMT:    MMT Right eval Left eval  Hip flexion 4 4-  Hip extension 4- 4  Hip abduction 3+ 4-  Hip adduction 4- 3-  Hip internal rotation    Hip external rotation    Knee flexion 3+ 4-  Knee extension 4 3+  Ankle dorsiflexion 4+ 3-  Ankle plantarflexion    Ankle inversion    Ankle eversion     (Blank rows = not tested)  LUMBAR SPECIAL TESTS:  Straight leg raise test: Negative  FUNCTIONAL TESTS:  10 meter walk test: 15.50 sec Dynamic Gait Index: TBA next visit  GAIT: Distance walked: 60 ft Assistive device utilized: None Level of assistance: SBA Gait pattern: step through pattern, decreased step length- Right, decreased step length- Left, decreased stance time- Left, decreased stride length, decreased hip/knee flexion- Right, decreased hip/knee flexion- Left, decreased ankle dorsiflexion- Right,  lateral lean- Right, and wide BOS Comments: gait speed = 2.12 ft/sec   TODAY'S TREATMENT: 01/20/22 Pt seen for aquatic therapy today.  Treatment took place in water 3.25-4.5 ft in depth at the [facility]. Temp of water was 93.  Pt entered/exited the pool via stairs independently with bilat rail.  Warm up of forward walking, backward walking and side stepping Ab set with short blue noodle pull down to thighs x 10 Kick board horiz submerged with row motion -in wide stance and staggered stance Trunk rotation with arms on kick board  Holding wall:  hamstring curls; heel/toe raises; hip abdct/add Forward walking kicks (in available range with core engaged) Holding rainbow hand buoys:  SLS with toe taps front, side, back x 5 x 2 each At bench in water:  STS x 5 reps with core engaged At stairs:  R quad stretch with foot on 2nd step behind her With back against wall, using noodle as strap - ITB, hamstring and adductor stretch - each leg  Pt requires the buoyancy and hydrostatic pressure of water for support, and to offload joints by unweighting joint load by at least 50 % in navel deep water and by at least 75-80% in chest to neck deep water.  Viscosity of the water is needed for resistance of strengthening. Water current perturbations provides challenge to standing balance requiring increased core activation.    01/12/22 Therapeutic Exercise: DGI: 15/24 Seated hamstring stretch 2x30 sec bil Supine thomas stretch 2x30 sec bil SKTC stretch 2x30 sec bil Seated lunge stretch for hip flexors x 30 sec bil Standing church pew x 10 Standing heel/toe raise x 10  01/05/22 Eval only   PATIENT EDUCATION:  Education details: initial HEP Person educated: Patient Education method: Consulting civil engineer, Media planner, and Handouts Education comprehension: verbalized understanding and returned demonstration   HOME EXERCISE PROGRAM: Access Code: [email or web address] URL:  [email or web address] Date: 01/12/2022 Prepared by: Clarene Essex  Exercises - Modified Thomas Stretch  - 1 x daily - 7 x weekly - 3  sets - 30 sec hold - Supine Lower Trunk Rotation  - 1 x daily - 7 x weekly - 3 sets - 10 reps - Supine Single Knee to Chest Stretch  - 1 x daily - 7 x weekly - 3 sets - 10 reps - Heel Toe Raises with Counter Support  - 1 x daily - 7 x weekly - 3 sets - 10 reps  ASSESSMENT:  CLINICAL IMPRESSION: Pt confident in aquatic setting; able to take direction from the therapist on deck.  She reported reduction of pain by 2 points while in the water.  She tolerated exercises well.  Goals are ongoing.   OBJECTIVE IMPAIRMENTS: Abnormal gait, decreased activity tolerance, decreased balance, decreased endurance, decreased knowledge of condition, decreased mobility, difficulty walking, decreased ROM, decreased strength, decreased safety awareness, increased fascial restrictions, impaired perceived functional ability, increased muscle spasms, impaired flexibility, improper body mechanics, postural dysfunction, and pain.   ACTIVITY LIMITATIONS: carrying, lifting, bending, sitting, standing, squatting, sleeping, stairs, transfers, bed mobility, bathing, dressing, locomotion level, and caring for others  PARTICIPATION LIMITATIONS: meal prep, cleaning, laundry, medication management, interpersonal relationship, driving, shopping, community activity, yard work, and church  PERSONAL FACTORS: Fitness, Past/current experiences, Time since onset of injury/illness/exacerbation, 3+ comorbidities: Back surgery L4 & L5; L TKA 07/2017 with revision 12/2017; B TSA; B ulnar nerve repair; Lumbar DDD and spinal stenosis; OA of multiple sites - hands, feet, knees, hips; psoriasis; psoriatic arthritis; h/o PE on chronic anticoagulation; h/o gastric bypass; diverticulitis; renal artery aneurysm; chronic stable angina, and chronic disability  are also affecting patient's functional outcome.    REHAB POTENTIAL: Good  CLINICAL DECISION MAKING: Evolving/moderate complexity  EVALUATION COMPLEXITY: Moderate   GOALS: Goals reviewed with patient? Yes  SHORT TERM GOALS: Target date: 01/26/2022   Patient will be independent with initial HEP to improve outcomes and carryover.  Baseline:  Goal status: IN PROGRESS  LONG TERM GOALS: Target date: 03/03/2022    Patient will be independent with ongoing/advanced HEP for self-management at home.  Baseline:  Goal status: IN PROGRESS  2.  Patient will report 50-75% improvement in low back pain to improve QOL.  Baseline:  Goal status: IN PROGRESS  3.  Patient will report at least 50-75% improvement in R knee pain to improve ambulation tolerance. Baseline:  Goal status: IN PROGRESS  4.  Patient to demonstrate ability to achieve and maintain good spinal alignment/posturing and body mechanics needed for daily activities. Baseline:  Goal status: IN PROGRESS  5.  Patient will demonstrate functional lumbar ROM w/o increased pain to perform ADLs.   Baseline:  Goal status: IN PROGRESS  6.  Patient will demonstrate improved R knee AROM to >/= 0-105 deg to allow for normal gait and stair mechanics. Baseline:  Goal status: IN PROGRESS  7.  Patient will demonstrate improved B strength to >/= 4+/5 for improved stability and ease of mobility . Baseline:  Goal status: IN PROGRESS  8.  Patient will tolerate >/= 20-30 min of standing or walking to perform chores, cooking, or grocery shopping . Baseline:  Goal status: IN PROGRESS  9.  Patient will demonstrate at least 19/24 on DGI to decrease risk of falls. Baseline:  Goal status: IN PROGRESS   10.  Patient will report </= 44% on Modified Oswestry to demonstrate improved functional ability with decreased limitation due to lumbar DDD. Baseline: Modified Oswestry 26/50 = 52.0% - severe disability Goal status: IN PROGRESS  11.  Patient will report 43/80 on LEFS to demonstrate improved  functional ability with decreased limitation due to R knee OA. Baseline: LEFS 34/80 = 42.5% Goal status: IN PROGRESS    PLAN: PT FREQUENCY: 2x/week  PT DURATION: 8 weeks  PLANNED INTERVENTIONS: Therapeutic exercises, Therapeutic activity, Neuromuscular re-education, Balance training, Gait training, Patient/Family education, Self Care, Joint mobilization, Stair training, Aquatic Therapy, Dry Needling, Electrical stimulation, Cryotherapy, Moist heat, Taping, Vasopneumatic device, Ultrasound, Ionotophoresis '4mg'$ /ml Dexamethasone, Manual therapy, and Re-evaluation.  PLAN FOR NEXT SESSION: review HEP; progress hip strengthening and core stability  - introduce golfer's lift for improved mechanics when reaching item from floor.   Kerin Perna, PTA 01/20/22 2:56 PM [facility] [address] [place], [place], [address] Phone: [phone]   Fax:  [phone]

## 2022-01-21 NOTE — Progress Notes (Deleted)
Office Visit Note  Patient: Connie Hoffman             Date of Birth: 1960/08/19           MRN: [id number]             PCP: Jenel Lucks, PA-C Referring: Burna Cash* Visit Date: 02/04/2022 Occupation: '@GUAROCC'$ @  Subjective:    History of Present Illness: Kenady Doxtater is a 61 y.o. female with history of psoriatic arthritis and osteoarthritis. She is taking Otezla 30 mg 1 tablet by mouth twice daily.   CBC and CMP updated on 11/12/21. CBC updated on 01/01/22.   Activities of Daily Living:  Patient reports morning stiffness for *** {minute/hour:19697}.   Patient {ACTIONS;DENIES/REPORTS:21021675::"Denies"} nocturnal pain.  Difficulty dressing/grooming: {ACTIONS;DENIES/REPORTS:21021675::"Denies"} Difficulty climbing stairs: {ACTIONS;DENIES/REPORTS:21021675::"Denies"} Difficulty getting out of chair: {ACTIONS;DENIES/REPORTS:21021675::"Denies"} Difficulty using hands for taps, buttons, cutlery, and/or writing: {ACTIONS;DENIES/REPORTS:21021675::"Denies"}  No Rheumatology ROS completed.   PMFS History:  Patient Active Problem List   Diagnosis Date Noted   Seizure ([place]) 11/03/2021   Diverticulitis of large intestine with abscess without bleeding 05/28/2021   History of pulmonary embolism 05/28/2021   Chronic anticoagulation 05/28/2021   Midline cystocele 05/23/2020   Pelvic mass 12/04/2019   DDD (degenerative disc disease), lumbar 01/21/2018   Primary osteoarthritis of both hands 01/21/2018   Primary osteoarthritis of both feet 01/21/2018   Psoriasis 12/01/2017   Psoriatic arthritis ([place]) 12/01/2017   High risk medication use 12/01/2017   S/P total knee replacement, 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   Thrombocytosis 12/01/2017   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    Past Medical History:  Diagnosis Date   Chronic stable angina (HCC)    DDD (degenerative disc disease), lumbar    Intestinal abscess    Psoriatic arthritis ([place])    Pulmonary embolism ([place])     Bilateral lungs   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[place])    Spinal stenosis     Family History  Problem Relation Age of Onset   Diabetes Mother    Hypertension Mother    Heart Problems Mother    Congestive Heart Failure Mother    Diabetes Father    Hypertension Father    Stroke Sister    Heart Problems Sister    Seizures Sister    Prostate cancer Brother    Diabetes Brother    Asthma Daughter    Healthy Daughter    Healthy Son    Healthy Son    Past Surgical History:  Procedure Laterality Date   BACK SURGERY  05/18/2019   L4 and L5   CHOLECYSTECTOMY     CYSTECTOMY  07/2017   JOINT REPLACEMENT Left    left knee   LAPAROSCOPIC ENDOMETRIOSIS FULGURATION  2011   LAPAROSCOPIC GASTRIC SLEEVE RESECTION  2018   LAPAROSCOPIC OOPHERECTOMY Right    LUMBAR EPIDURAL INJECTION  04/13/2018   REVISION TOTAL KNEE ARTHROPLASTY Left 12/2017   TOTAL SHOULDER ARTHROPLASTY Bilateral    TRIGGER FINGER RELEASE Bilateral 2023   bilateral 3rd digit   TUBAL LIGATION     postpartum   ULNAR NERVE REPAIR Bilateral 2023   Social History   Social History Narrative   Right handed    Lives with husband and 1 daughters    Immunization History  Administered Date(s) Administered   Influenza,inj,Quad PF,6+ Mos 04/15/2018   PFIZER(Purple Top)SARS-COV-2 Vaccination 07/15/2019, 08/05/2019, 03/13/2020   Pneumococcal Polysaccharide-23 04/15/2018   Zoster Recombinat (Shingrix) 05/13/2018, 10/25/2019     Objective: Vital Signs: There were no vitals taken for  this visit.   Physical Exam Vitals and nursing note reviewed.  Constitutional:      Appearance: She is well-developed.  HENT:     Head: Normocephalic and atraumatic.  Eyes:     Conjunctiva/sclera: Conjunctivae normal.  Cardiovascular:     Rate and Rhythm: Normal rate and regular rhythm.     Heart sounds: Normal heart sounds.  Pulmonary:     Effort: Pulmonary effort is normal.     Breath sounds: Normal breath sounds.  Abdominal:     General:  Bowel sounds are normal.     Palpations: Abdomen is soft.  Musculoskeletal:     Cervical back: Normal range of motion.  Skin:    General: Skin is warm and dry.     Capillary Refill: Capillary refill takes less than 2 seconds.  Neurological:     Mental Status: She is alert and oriented to person, place, and time.  Psychiatric:        Behavior: Behavior normal.      Musculoskeletal Exam: ***  CDAI Exam: CDAI Score: -- Patient Global: --; Provider Global: -- Swollen: --; Tender: -- Joint Exam 02/04/2022   No joint exam has been documented for this visit   There is currently no information documented on the homunculus. Go to the Rheumatology activity and complete the homunculus joint exam.  Investigation: No additional findings.  Imaging: No results found.  Recent Labs: Lab Results  Component Value Date   WBC 6.5 11/03/2021   HGB 11.1 (L) 11/03/2021   PLT 379 11/03/2021   NA 143 11/03/2021   K 3.8 11/03/2021   CL 105 11/03/2021   CO2 28 11/03/2021   GLUCOSE 122 (H) 11/03/2021   BUN 6 11/03/2021   CREATININE 0.72 11/03/2021   BILITOT 0.5 11/03/2021   ALKPHOS 121 11/03/2021   AST 24 11/03/2021   ALT 37 11/03/2021   PROT 6.7 11/03/2021   ALBUMIN 3.2 (L) 11/03/2021   CALCIUM 8.9 11/03/2021   GFRAA 103 10/08/2020   QFTBGOLDPLUS NEGATIVE 05/01/2020    Speciality Comments: Cosentyx start date:04/2018  Procedures:  No procedures performed Allergies: Aspirin and Tylenol [acetaminophen]   Assessment / Plan:     Visit Diagnoses: No diagnosis found.  Orders: No orders of the defined types were placed in this encounter.  No orders of the defined types were placed in this encounter.   Face-to-face time spent with patient was *** minutes. Greater than 50% of time was spent in counseling and coordination of care.  Follow-Up Instructions: No follow-ups on file.   Earnestine Mealing, CMA  Note - This record has been created using Editor, commissioning.  Chart creation  errors have been sought, but may not always  have been located. Such creation errors do not reflect on  the standard of medical care.

## 2022-01-23 ENCOUNTER — Ambulatory Visit (HOSPITAL_BASED_OUTPATIENT_CLINIC_OR_DEPARTMENT_OTHER): Payer: Medicare Other | Admitting: Physical Therapy

## 2022-01-26 ENCOUNTER — Other Ambulatory Visit (HOSPITAL_COMMUNITY): Payer: Self-pay

## 2022-01-26 ENCOUNTER — Encounter: Payer: Medicare Other | Admitting: Physical Therapy

## 2022-01-27 ENCOUNTER — Ambulatory Visit (HOSPITAL_BASED_OUTPATIENT_CLINIC_OR_DEPARTMENT_OTHER): Payer: Medicare Other | Attending: Physician Assistant | Admitting: Physical Therapy

## 2022-01-27 ENCOUNTER — Encounter (HOSPITAL_BASED_OUTPATIENT_CLINIC_OR_DEPARTMENT_OTHER): Payer: Self-pay | Admitting: Physical Therapy

## 2022-01-27 DIAGNOSIS — G8929 Other chronic pain: Secondary | ICD-10-CM | POA: Insufficient documentation

## 2022-01-27 DIAGNOSIS — M25561 Pain in right knee: Secondary | ICD-10-CM | POA: Insufficient documentation

## 2022-01-27 DIAGNOSIS — M6281 Muscle weakness (generalized): Secondary | ICD-10-CM | POA: Diagnosis present

## 2022-01-27 DIAGNOSIS — R2689 Other abnormalities of gait and mobility: Secondary | ICD-10-CM | POA: Diagnosis present

## 2022-01-27 DIAGNOSIS — M5459 Other low back pain: Secondary | ICD-10-CM | POA: Insufficient documentation

## 2022-01-27 NOTE — Therapy (Addendum)
OUTPATIENT PHYSICAL THERAPY TREATMENT / DISCHARGE SUMMARY   Patient Name: Connie Hoffman MRN: [id number] DOB:1960-09-05, 61 y.o., female Today's Date: 01/27/2022   PT End of Session - 01/27/22 1407     Visit Number 4    Date for PT Re-Evaluation 03/03/22    Authorization Type [employer] Medicare & Medicaid    PT Start Time 1404    PT Stop Time 1442    PT Time Calculation (min) 38 min    Activity Tolerance Patient tolerated treatment well    Behavior During Therapy WFL for tasks assessed/performed              Past Medical History:  Diagnosis Date   Chronic stable angina    DDD (degenerative disc disease), lumbar    Intestinal abscess    Psoriatic arthritis ([place])    Pulmonary embolism ([place])    Bilateral lungs   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[place])    Spinal stenosis    Past Surgical History:  Procedure Laterality Date   BACK SURGERY  05/18/2019   L4 and L5   CHOLECYSTECTOMY     CYSTECTOMY  07/2017   JOINT REPLACEMENT Left    left knee   LAPAROSCOPIC ENDOMETRIOSIS FULGURATION  2011   LAPAROSCOPIC GASTRIC SLEEVE RESECTION  2018   LAPAROSCOPIC OOPHERECTOMY Right    LUMBAR EPIDURAL INJECTION  04/13/2018   REVISION TOTAL KNEE ARTHROPLASTY Left 12/2017   TOTAL SHOULDER ARTHROPLASTY Bilateral    TRIGGER FINGER RELEASE Bilateral 2023   bilateral 3rd digit   TUBAL LIGATION     postpartum   ULNAR NERVE REPAIR Bilateral 2023   Patient Active Problem List   Diagnosis Date Noted   Seizure ([place]) 11/03/2021   Diverticulitis of large intestine with abscess without bleeding 05/28/2021   History of pulmonary embolism 05/28/2021   Chronic anticoagulation 05/28/2021   Midline cystocele 05/23/2020   Pelvic mass 12/04/2019   DDD (degenerative disc disease), lumbar 01/21/2018   Primary osteoarthritis of both hands 01/21/2018   Primary osteoarthritis of both feet 01/21/2018   Psoriasis 12/01/2017   Psoriatic arthritis ([place]) 12/01/2017   High risk medication use 12/01/2017   S/P total  knee replacement, 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   Thrombocytosis 12/01/2017   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    PCP: Jenel Lucks, PA-C  REFERRING PROVIDER: Ofilia Neas, PA-C  REFERRING DIAG:  M17.11 (ICD-10-CM) - Primary osteoarthritis of right knee  M51.36 (ICD-10-CM) - DDD (degenerative disc disease), lumbar    RATIONALE FOR EVALUATION AND TREATMENT: Rehabilitation  THERAPY DIAG:  Other low back pain  Chronic pain of right knee  Muscle weakness (generalized)  Other abnormalities of gait and mobility  ONSET DATE: chronic  NEXT MD VISIT: 02/04/22   SUBJECTIVE:  SUBJECTIVE STATEMENT: Pt reports she felt was tired after last session, but felt good.  "Today is a good day; all my joints feel good" She reports she has had some chest tightness over the last week; plans to call the cardiologist today.   PT order - Aquatic therapy: DDD lumbar spine and osteoarthritis of right knee.  PAIN:  Are you having pain? no: NPRS scale: 0/10 Pain location:  Pain description:  Aggravating factors: bending, lifting Relieving factors: Icy-Hot, heating pad, ice, soak in a hot tub, muscle relaxant on a bad day    PERTINENT HISTORY: Back surgery L4 & L5- 2021; L TKA 07/2017 with revision 12/2017; B TSA; B ulnar nerve repair; Lumbar DDD and spinal stenosis; OA of multiple sites - hands, feet, knees, hips; psoriasis; psoriatic arthritis; h/o PE on chronic anticoagulation; h/o gastric bypass; diverticulitis; renal artery aneurysm; chronic stable angina  PRECAUTIONS: Fall  WEIGHT BEARING RESTRICTIONS: No  FALLS:  Has patient fallen in last 6 months? No - reports increased falls 1-2 yrs ago  LIVING ENVIRONMENT: Lives with: lives with their spouse and lives with their daughter Lives in:  House/apartment Stairs: No Has following equipment at home: Lobbyist, Environmental consultant - 2 wheeled, and bed side commode  OCCUPATION: Disabled  PLOF: Independent and Leisure: painting, cleaning, gardening, home DIY projects, reading  PATIENT GOALS: "More tolerance for standing/walking and better mobility."   OBJECTIVE:   DIAGNOSTIC FINDINGS:  12/05/21 - Lumbar spine x-ray:  Multilevel degenerative changes of the lumbar spine.  Interlaminar fixation L4/L5. Vertebral body heights maintained.  No acute fracture. No subluxation. Moderate disc space height loss L4/L5 and L5/S1. Mild to moderate disc space height loss L3/L4. Moderate multilevel facet degenerative changes of the lumbar spine. Bilateral hip osteoarthritis. Atherosclerosis.  04/11/20 - Lumbar MRI:   - Interval surgery at L4-L5 with placement of a intraspinous device. There is no significant spinal stenosis.  - There is a moderate stenosis of the right neural foramen at L4-5 and moderate stenosis of the left neural foramen at L5-S1 which are unchanged.    04/11/22 - R knee MRI:  - Chronic tear, maceration, and extrusion of the medial meniscus.   - Bone-on-bone medial compartment arthrosis, potentially crystalline or inflammatory in etiology, with numerous prominent subchondral osseous erosions, most pronounced in the central medial tibial plateau. Extensive cartilage degenerative changes as above. - Small chondroid lesion in the distal femur likely representing enchondroma despite the fact that it extends to the anterior endosteal margin.  - Mild radial free edge degeneration of the lateral meniscus.  - Degenerative changes of the ACL, impinged upon by notch osteophytes.    12/21/19 - R knee x-ray: Tricompartmental degenerative changes.  PATIENT SURVEYS:  Modified Oswestry 26/50 = 52.0% - severe disability  LEFS 34/80 = 42.5%  SCREENING FOR RED FLAGS: Bowel or bladder incontinence: No Spinal tumors: No Cauda equina  syndrome: No Compression fracture: No Abdominal aneurysm: No  COGNITION:  Overall cognitive status: Within functional limits for tasks assessed     SENSATION: WFL  MUSCLE LENGTH: Hamstrings: WFL ITB: mild tight B Piriformis: mod tight B Hip flexors: mild/mod tight B Quads: mod tight R; limited by knee ROM on L Heelcord: mod tight   POSTURE:  weight shift right  PALPATION: TTP with increase muscle tension in B lumbar paraspinals, glutes and piriformis  LUMBAR ROM:   Active  A/PROM  eval  Flexion Hands to ankles  Extension 25% limited  Right lateral flexion Hand to lateral knee  Left lateral flexion Hand to lateral knee  Right rotation 40% limited  Left rotation 40% limited   (Blank rows = not tested)  LOWER EXTREMITY ROM:     Active  Right eval Left eval  Hip flexion    Hip extension    Hip abduction    Hip adduction    Hip internal rotation    Hip external rotation    Knee flexion 93 seated 97 supine 44 seated & supine  Knee extension 11 seated 0 supine 3 seated 1 supine  Ankle dorsiflexion    Ankle plantarflexion    Ankle inversion    Ankle eversion     (Blank rows = not tested)  LOWER EXTREMITY MMT:    MMT Right eval Left eval  Hip flexion 4 4-  Hip extension 4- 4  Hip abduction 3+ 4-  Hip adduction 4- 3-  Hip internal rotation    Hip external rotation    Knee flexion 3+ 4-  Knee extension 4 3+  Ankle dorsiflexion 4+ 3-  Ankle plantarflexion    Ankle inversion    Ankle eversion     (Blank rows = not tested)  LUMBAR SPECIAL TESTS:  Straight leg raise test: Negative  FUNCTIONAL TESTS:  10 meter walk test: 15.50 sec Dynamic Gait Index: TBA next visit  GAIT: Distance walked: 60 ft Assistive device utilized: None Level of assistance: SBA Gait pattern: step through pattern, decreased step length- Right, decreased step length- Left, decreased stance time- Left, decreased stride length, decreased hip/knee flexion- Right, decreased  hip/knee flexion- Left, decreased ankle dorsiflexion- Right, lateral lean- Right, and wide BOS Comments: gait speed = 2.12 ft/sec   TODAY'S TREATMENT: 01/20/22 Pt seen for aquatic therapy today.  Treatment took place in water 3.25-4.5 ft in depth at the [facility]. Temp of water was 93.  Pt entered/exited the pool via stairs independently with bilat rail.  At wall:  weight shifts forward/backward for ROM of ankle and Knee Without support: Warm up of forward walking, backward walking and side stepping Holding Yellow noodle:  single leg forward leans x 6 reps each; cues for form.  Ab set with short blue noodle pull down to thighs x 10; repeated with thin square noodle x 10 Kick board horiz submerged with row motion -in staggered stance Trunk rotation with arms on kick board  Holding yellow noodle:   hip abdct/add x 10 each; heel raises narrow stance x 10, wide stance x 10 Forward walking kicks (in available range with core engaged) Holding rainbow hand buoys:  SLS with toe taps front, side, back x 5 x 2 each At bench in water:  STS x 10 reps with core engaged At stairs:  R/L quad stretch with foot behind her on step   Pt requires the buoyancy and hydrostatic pressure of water for support, and to offload joints by unweighting joint load by at least 50 % in navel deep water and by at least 75-80% in chest to neck deep water.  Viscosity of the water is needed for resistance of strengthening. Water current perturbations provides challenge to standing balance requiring increased core activation.    01/12/22 Therapeutic Exercise: DGI: 15/24 Seated hamstring stretch 2x30 sec bil Supine thomas stretch 2x30 sec bil SKTC stretch 2x30 sec bil Seated lunge stretch for hip flexors x 30 sec bil Standing church pew x 10 Standing heel/toe raise x 10  01/05/22 Eval only   PATIENT EDUCATION:  Education details: initial HEP Person educated: Patient  Education method: Explanation,  Demonstration, and Handouts Education comprehension: verbalized understanding and returned demonstration   HOME EXERCISE PROGRAM: Access Code: [email or web address] URL: [email or web address] Date: 01/12/2022 Prepared by: Clarene Essex  Exercises - Modified Thomas Stretch  - 1 x daily - 7 x weekly - 3 sets - 30 sec hold - Supine Lower Trunk Rotation  - 1 x daily - 7 x weekly - 3 sets - 10 reps - Supine Single Knee to Chest Stretch  - 1 x daily - 7 x weekly - 3 sets - 10 reps - Heel Toe Raises with Counter Support  - 1 x daily - 7 x weekly - 3 sets - 10 reps  ASSESSMENT:  CLINICAL IMPRESSION: Pt arrived pain free, and remained pain free during session.  She reported reduction of chest tightness while in water. She was able to tolerate increased # of exercises with increased balance challenge.  Progressing towards goals.   OBJECTIVE IMPAIRMENTS: Abnormal gait, decreased activity tolerance, decreased balance, decreased endurance, decreased knowledge of condition, decreased mobility, difficulty walking, decreased ROM, decreased strength, decreased safety awareness, increased fascial restrictions, impaired perceived functional ability, increased muscle spasms, impaired flexibility, improper body mechanics, postural dysfunction, and pain.   ACTIVITY LIMITATIONS: carrying, lifting, bending, sitting, standing, squatting, sleeping, stairs, transfers, bed mobility, bathing, dressing, locomotion level, and caring for others  PARTICIPATION LIMITATIONS: meal prep, cleaning, laundry, medication management, interpersonal relationship, driving, shopping, community activity, yard work, and church  PERSONAL FACTORS: Fitness, Past/current experiences, Time since onset of injury/illness/exacerbation, 3+ comorbidities: Back surgery L4 & L5; L TKA 07/2017 with revision 12/2017; B TSA; B ulnar nerve repair; Lumbar DDD and spinal stenosis; OA of multiple sites - hands, feet, knees, hips; psoriasis; psoriatic  arthritis; h/o PE on chronic anticoagulation; h/o gastric bypass; diverticulitis; renal artery aneurysm; chronic stable angina, and chronic disability  are also affecting patient's functional outcome.   REHAB POTENTIAL: Good  CLINICAL DECISION MAKING: Evolving/moderate complexity  EVALUATION COMPLEXITY: Moderate   GOALS: Goals reviewed with patient? Yes  SHORT TERM GOALS: Target date: 01/26/2022   Patient will be independent with initial HEP to improve outcomes and carryover.  Baseline:  Goal status: IN PROGRESS  LONG TERM GOALS: Target date: 03/03/2022    Patient will be independent with ongoing/advanced HEP for self-management at home.  Baseline:  Goal status: IN PROGRESS  2.  Patient will report 50-75% improvement in low back pain to improve QOL.  Baseline:  Goal status: IN PROGRESS  3.  Patient will report at least 50-75% improvement in R knee pain to improve ambulation tolerance. Baseline:  Goal status: IN PROGRESS  4.  Patient to demonstrate ability to achieve and maintain good spinal alignment/posturing and body mechanics needed for daily activities. Baseline:  Goal status: IN PROGRESS  5.  Patient will demonstrate functional lumbar ROM w/o increased pain to perform ADLs.   Baseline:  Goal status: IN PROGRESS  6.  Patient will demonstrate improved R knee AROM to >/= 0-105 deg to allow for normal gait and stair mechanics. Baseline:  Goal status: IN PROGRESS  7.  Patient will demonstrate improved B strength to >/= 4+/5 for improved stability and ease of mobility . Baseline:  Goal status: IN PROGRESS  8.  Patient will tolerate >/= 20-30 min of standing or walking to perform chores, cooking, or grocery shopping . Baseline:  Goal status: IN PROGRESS  9.  Patient will demonstrate at least 19/24 on DGI to decrease risk of falls. Baseline:  Goal status: IN PROGRESS  10.  Patient will report </= 44% on Modified Oswestry to demonstrate improved functional ability  with decreased limitation due to lumbar DDD. Baseline: Modified Oswestry 26/50 = 52.0% - severe disability Goal status: IN PROGRESS  11.  Patient will report 43/80 on LEFS to demonstrate improved functional ability with decreased limitation due to R knee OA. Baseline: LEFS 34/80 = 42.5% Goal status: IN PROGRESS    PLAN: PT FREQUENCY: 2x/week  PT DURATION: 8 weeks  PLANNED INTERVENTIONS: Therapeutic exercises, Therapeutic activity, Neuromuscular re-education, Balance training, Gait training, Patient/Family education, Self Care, Joint mobilization, Stair training, Aquatic Therapy, Dry Needling, Electrical stimulation, Cryotherapy, Moist heat, Taping, Vasopneumatic device, Ultrasound, Ionotophoresis 46m/ml Dexamethasone, Manual therapy, and Re-evaluation.  PLAN FOR NEXT SESSION: review HEP; progress hip strengthening and core stability  -   JKerin Perna PTA 01/27/22 2:47 PM [facility] [address] [address]Phone: [phone]  Fax:  [phone]   PHYSICAL THERAPY DISCHARGE SUMMARY  Visits from Start of Care: 4  Current functional level related to goals / functional outcomes:   Refer to above clinical impression and goal assessment for status as of last visit on 01/27/22. Patient NS'd for next visit, then cancelled all remaining visits stating she was overwhelmed with the number of medical appointments. She was placed on a 30-day hold, but did not return to PT in >30 days, therefore will proceed with discharge from PT for this episode.    Remaining deficits:   As above. Unable to formally assess status at discharge due to failure to return to PT.   Education / Equipment:   Initial HEP   Patient agrees to discharge. Patient goals were not met. Patient is being discharged due to not returning since the last visit.  JPercival Spanish PT, MPT 03/24/22, 9:35 AM  [facility] [place] [address]Phone: [phone]  Fax:  [phone]

## 2022-01-28 ENCOUNTER — Other Ambulatory Visit (HOSPITAL_COMMUNITY): Payer: Self-pay

## 2022-01-29 ENCOUNTER — Encounter: Payer: Medicare Other | Admitting: Physical Therapy

## 2022-01-30 ENCOUNTER — Other Ambulatory Visit (HOSPITAL_COMMUNITY): Payer: Self-pay

## 2022-01-30 ENCOUNTER — Telehealth (HOSPITAL_BASED_OUTPATIENT_CLINIC_OR_DEPARTMENT_OTHER): Payer: Self-pay | Admitting: Physical Therapy

## 2022-01-30 ENCOUNTER — Ambulatory Visit (HOSPITAL_BASED_OUTPATIENT_CLINIC_OR_DEPARTMENT_OTHER): Payer: Medicare Other | Admitting: Physical Therapy

## 2022-01-30 NOTE — Telephone Encounter (Signed)
Patient did not show for aquatic PT appointment.  Called and spoke to patient. She stated she forgot her appointment, "has been in a fog all morning".  Patient was on her way to Cardiologist appointment when we spoke.  Confirmed her of upcoming PT appointment on 10/10.   Kerin Perna, PTA 01/30/22 2:17 PM [facility] [address] [place], [place], [address] Phone: [phone]   Fax:  [phone]

## 2022-02-03 ENCOUNTER — Ambulatory Visit (HOSPITAL_BASED_OUTPATIENT_CLINIC_OR_DEPARTMENT_OTHER): Payer: Medicare Other | Admitting: Physical Therapy

## 2022-02-04 ENCOUNTER — Ambulatory Visit: Payer: Medicare Other | Admitting: Physician Assistant

## 2022-02-04 DIAGNOSIS — M1711 Unilateral primary osteoarthritis, right knee: Secondary | ICD-10-CM

## 2022-02-04 DIAGNOSIS — Z79899 Other long term (current) drug therapy: Secondary | ICD-10-CM

## 2022-02-04 DIAGNOSIS — Z9884 Bariatric surgery status: Secondary | ICD-10-CM

## 2022-02-04 DIAGNOSIS — L409 Psoriasis, unspecified: Secondary | ICD-10-CM

## 2022-02-04 DIAGNOSIS — M19071 Primary osteoarthritis, right ankle and foot: Secondary | ICD-10-CM

## 2022-02-04 DIAGNOSIS — K5720 Diverticulitis of large intestine with perforation and abscess without bleeding: Secondary | ICD-10-CM

## 2022-02-04 DIAGNOSIS — Z96652 Presence of left artificial knee joint: Secondary | ICD-10-CM

## 2022-02-04 DIAGNOSIS — M5136 Other intervertebral disc degeneration, lumbar region: Secondary | ICD-10-CM

## 2022-02-04 DIAGNOSIS — M19041 Primary osteoarthritis, right hand: Secondary | ICD-10-CM

## 2022-02-04 DIAGNOSIS — L4050 Arthropathic psoriasis, unspecified: Secondary | ICD-10-CM

## 2022-02-04 DIAGNOSIS — D75839 Thrombocytosis, unspecified: Secondary | ICD-10-CM

## 2022-02-04 DIAGNOSIS — Z7901 Long term (current) use of anticoagulants: Secondary | ICD-10-CM

## 2022-02-04 DIAGNOSIS — Z86711 Personal history of pulmonary embolism: Secondary | ICD-10-CM

## 2022-02-04 DIAGNOSIS — G5623 Lesion of ulnar nerve, bilateral upper limbs: Secondary | ICD-10-CM

## 2022-02-06 ENCOUNTER — Ambulatory Visit (HOSPITAL_BASED_OUTPATIENT_CLINIC_OR_DEPARTMENT_OTHER): Payer: Medicare Other | Admitting: Physical Therapy

## 2022-02-10 ENCOUNTER — Ambulatory Visit (HOSPITAL_BASED_OUTPATIENT_CLINIC_OR_DEPARTMENT_OTHER): Payer: Medicare Other | Admitting: Physical Therapy

## 2022-02-11 NOTE — Progress Notes (Signed)
Office Visit Note  Patient: Connie Hoffman             Date of Birth: 22-Oct-1960           MRN: [id number]             PCP: Jenel Lucks, PA-C Referring: Burna Cash* Visit Date: 02/13/2022 Occupation: '@GUAROCC'$ @  Subjective:  Pain in both hands  History of Present Illness: Connie Hoffman is a 61 y.o. female with history of psoriatic arthritis and osteoarthritis.  Patient is currently taking Otezla 30 mg 1 tablet by mouth twice daily.  Patient reports that she continues to experience diarrhea while taking Otezla.  She states that she has to take Imodium most days to help manage her symptoms.  She states if she does not take Imodium she has 3-4 episodes of diarrhea.  Patient reports that she has been having increased pain in multiple joints, most severe in both hands, both knees, and both ankle joints.  She states that she has noticed some swelling in her hands first thing in the morning.  She currently rates her pain in the hands and 8 out of 10.  She denies any discomfort in her SI joints at this time.  She is having some issues with right Achilles tendinitis and is under the care of Dr. Lorenz Coaster.  She states she has 1 small patch of psoriasis behind the right ear.  Activities of Daily Living:  Patient reports morning stiffness for 1 hour.   Patient Denies nocturnal pain.  Difficulty dressing/grooming: Reports Difficulty climbing stairs: Reports Difficulty getting out of chair: Reports Difficulty using hands for taps, buttons, cutlery, and/or writing: Reports  Review of Systems  Constitutional:  Positive for fatigue.  HENT:  Negative for mouth sores and mouth dryness.   Eyes:  Positive for dryness.  Respiratory:  Positive for shortness of breath.   Cardiovascular:  Negative for chest pain and palpitations.  Gastrointestinal:  Positive for diarrhea. Negative for blood in stool and constipation.  Endocrine: Negative for increased urination.   Genitourinary:  Negative for involuntary urination.  Musculoskeletal:  Positive for joint pain, gait problem, joint pain and morning stiffness. Negative for joint swelling, myalgias, muscle weakness, muscle tenderness and myalgias.  Skin:  Positive for rash and sensitivity to sunlight. Negative for color change and hair loss.  Allergic/Immunologic: Positive for susceptible to infections.  Neurological:  Negative for dizziness and headaches.  Hematological:  Negative for swollen glands.  Psychiatric/Behavioral:  Positive for sleep disturbance. Negative for depressed mood. The patient is not nervous/anxious.     PMFS History:  Patient Active Problem List   Diagnosis Date Noted   Seizure ([place]) 11/03/2021   Diverticulitis of large intestine with abscess without bleeding 05/28/2021   History of pulmonary embolism 05/28/2021   Chronic anticoagulation 05/28/2021   Midline cystocele 05/23/2020   Pelvic mass 12/04/2019   DDD (degenerative disc disease), lumbar 01/21/2018   Primary osteoarthritis of both hands 01/21/2018   Primary osteoarthritis of both feet 01/21/2018   Psoriasis 12/01/2017   Psoriatic arthritis ([place]) 12/01/2017   High risk medication use 12/01/2017   S/P total knee replacement, 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   Thrombocytosis 12/01/2017   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    Past Medical History:  Diagnosis Date   Chronic stable angina    DDD (degenerative disc disease), lumbar    Intestinal abscess    Psoriatic arthritis ([place])    Pulmonary embolism ([facility])    Bilateral lungs   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[place])    Spinal stenosis     Family History  Problem Relation Age of Onset   Diabetes Mother    Hypertension Mother    Heart Problems Mother    Congestive Heart Failure Mother    Diabetes Father    Hypertension Father    Psoriasis Father    Stroke Sister    Heart Problems Sister    Seizures Sister    Prostate cancer Brother    Diabetes Brother    Asthma Daughter     Healthy Daughter    Healthy Son    Healthy Son    Past Surgical History:  Procedure Laterality Date   BACK SURGERY  05/18/2019   L4 and L5   CHOLECYSTECTOMY     CYSTECTOMY  07/2017   JOINT REPLACEMENT Left    left knee   LAPAROSCOPIC ENDOMETRIOSIS FULGURATION  2011   LAPAROSCOPIC GASTRIC SLEEVE RESECTION  2018   LAPAROSCOPIC OOPHERECTOMY Right    LUMBAR EPIDURAL INJECTION  04/13/2018   REVISION TOTAL KNEE ARTHROPLASTY Left 12/2017   TOTAL SHOULDER ARTHROPLASTY Bilateral    TRIGGER FINGER RELEASE Bilateral 2023   bilateral 3rd digit   TUBAL LIGATION     postpartum   ULNAR NERVE REPAIR Bilateral 2023   Social History   Social History Narrative   Right handed    Lives with husband and 1 daughters    Immunization History  Administered Date(s) Administered   Influenza,inj,Quad PF,6+ Mos 04/15/2018   PFIZER(Purple Top)SARS-COV-2 Vaccination 07/15/2019, 08/05/2019, 03/13/2020   Pneumococcal Polysaccharide-23 04/15/2018   Zoster Recombinat (Shingrix) 05/13/2018, 10/25/2019     Objective: Vital Signs: BP 130/81 (BP Location: Left Wrist, Patient Position: Sitting, Cuff Size: Normal)   Pulse 60   Resp 15   Ht '5\' 5"'$  (1.651 m)   Wt 266 lb (120.7 kg)   BMI 44.26 kg/m    Physical Exam Vitals and nursing note reviewed.  Constitutional:      Appearance: She is well-developed.  HENT:     Head: Normocephalic and atraumatic.  Eyes:     Conjunctiva/sclera: Conjunctivae normal.  Cardiovascular:     Rate and Rhythm: Normal rate and regular rhythm.     Heart sounds: Normal heart sounds.  Pulmonary:     Effort: Pulmonary effort is normal.     Breath sounds: Normal breath sounds.  Abdominal:     General: Bowel sounds are normal.     Palpations: Abdomen is soft.  Musculoskeletal:     Cervical back: Normal range of motion.  Skin:    General: Skin is warm and dry.     Capillary Refill: Capillary refill takes less than 2 seconds.  Neurological:     Mental Status: She is alert  and oriented to person, place, and time.  Psychiatric:        Behavior: Behavior normal.      Musculoskeletal Exam: C-spine has limited range of motion without rotation.  No midline spinal tenderness.  No SI joint tenderness.  Shoulder joints have painful range of motion with stiffness bilaterally.  Elbow joints have good range of motion with no tenderness or synovitis.  Tenderness over MCPs, PIPs, DIP joints.  No obvious synovitis or dactylitis noted.  Complete fist formation bilaterally.  Hip joints have good range of motion with no groin pain.  Right knee joint has limited extension.  Left knee replacement has good range of motion.  Ankle joints have good range of motion with tenderness bilaterally.  Tenderness at the  right Achilles insertion site.  CDAI Exam: CDAI Score: -- Patient Global: --; Provider Global: -- Swollen: --; Tender: -- Joint Exam 02/13/2022   No joint exam has been documented for this visit   There is currently no information documented on the homunculus. Go to the Rheumatology activity and complete the homunculus joint exam.  Investigation: No additional findings.  Imaging: No results found.  Recent Labs: Lab Results  Component Value Date   WBC 6.5 11/03/2021   HGB 11.1 (L) 11/03/2021   PLT 379 11/03/2021   NA 143 11/03/2021   K 3.8 11/03/2021   CL 105 11/03/2021   CO2 28 11/03/2021   GLUCOSE 122 (H) 11/03/2021   BUN 6 11/03/2021   CREATININE 0.72 11/03/2021   BILITOT 0.5 11/03/2021   ALKPHOS 121 11/03/2021   AST 24 11/03/2021   ALT 37 11/03/2021   PROT 6.7 11/03/2021   ALBUMIN 3.2 (L) 11/03/2021   CALCIUM 8.9 11/03/2021   GFRAA 103 10/08/2020   QFTBGOLDPLUS NEGATIVE 05/01/2020    Speciality Comments: Cosentyx start date:04/2018  Procedures:  No procedures performed Allergies: Aspirin and Tylenol [acetaminophen]    Assessment / Plan:     Visit Diagnoses: Psoriatic arthritis ([place]): Patient has no obvious synovitis or dactylitis on  examination today.  She presents today with increased pain in both hands, both knees (left knee replaced), and both ankle joints.  She currently rates the pain in her hands and 8 out of 10.  She is not having difficulty with ADLs and has noticed decreased grip strength in her hands recently.  She remains on Otezla 30 mg 1 tablet by mouth twice daily.  She has had ongoing diarrhea while taking [place] and has to take Imodium most days.  She initiated [place] on 05/28/2021 but has noticed very little improvement in her symptoms.  She has no obvious synovitis or dactylitis on examination today.  She is not experiencing any SI joint pain.  She has a very small patch of psoriasis behind the right ear.   Discussed my apprehension to start on a biologic agent given her history of diverticulitis with an abscess in the past.  Discussed that if she obtains clearance from gastroenterology to restart on Biologics we could further discuss the use of Tremfya or Skyrizi in the future. In the meantime an ultrasound of both hands will be scheduled to assess for synovitis.  Psoriasis: She has 1 small patch of psoriasis behind the right ear.  High risk medication use -  Otezla 30 mg 1 tablet by mouth twice daily.  Initiated otezla on 05/28/21.  Previous therapy: Cosentyx: History of diverticulitis with sigmoid colon abscess. CBC and CMP drawn on 11/12/21.  CBC updated on 01/01/22.   Thrombocytosis: Platelet count within normal limits on 01/01/2022.  Primary osteoarthritis of both hands: She has some PIP and DIP thickening consistent with osteoarthritis of both hands.  She is currently experiencing significant discomfort and stiffness in both hands.  She has tenderness over all MCP, PIP, DIP joints.  No obvious synovitis or dactylitis was noted on examination today.  She is rating her pain an 8 out of 10.  Plan to schedule ultrasound of both hands to assess for synovitis.  Ulnar neuropathy of both upper extremities - S/p surgical  release bilaterally-performed by Dr. French Ana.  S/P total knee replacement, left: Chronic pain.  Primary osteoarthritis of right knee: Chronic pain.  No warmth or effusion.   Primary osteoarthritis of both feet: She is not experiencing any increased  discomfort in her feet at this time. She is wearing proper fitting shoes.  DDD (degenerative disc disease), lumbar - Dr. Katherine Roan.  Her lower back pain is currently well controlled.  She has no midline spinal tenderness or symptoms of radiculopathy at this time.  Other medical conditions are listed as follows:  Chronic anticoagulation  History of gastric bypass  Diverticulitis of large intestine with abscess without bleeding  History of pulmonary embolism  Nocturnal muscle cramps  Frequent falls  Orders: No orders of the defined types were placed in this encounter.  No orders of the defined types were placed in this encounter.   Follow-Up Instructions: Return in about 3 months (around 05/16/2022) for Psoriatic arthritis, Osteoarthritis.   Ofilia Neas, PA-C  Note - This record has been created using Dragon software.  Chart creation errors have been sought, but may not always  have been located. Such creation errors do not reflect on  the standard of medical care.

## 2022-02-13 ENCOUNTER — Encounter: Payer: Self-pay | Admitting: Physician Assistant

## 2022-02-13 ENCOUNTER — Ambulatory Visit (HOSPITAL_BASED_OUTPATIENT_CLINIC_OR_DEPARTMENT_OTHER): Payer: Medicare Other | Admitting: Physical Therapy

## 2022-02-13 ENCOUNTER — Ambulatory Visit: Payer: Medicare Other | Attending: Physician Assistant | Admitting: Physician Assistant

## 2022-02-13 VITALS — BP 130/81 | HR 60 | Resp 15 | Ht 65.0 in | Wt 266.0 lb

## 2022-02-13 DIAGNOSIS — K5720 Diverticulitis of large intestine with perforation and abscess without bleeding: Secondary | ICD-10-CM

## 2022-02-13 DIAGNOSIS — L409 Psoriasis, unspecified: Secondary | ICD-10-CM | POA: Diagnosis not present

## 2022-02-13 DIAGNOSIS — M19071 Primary osteoarthritis, right ankle and foot: Secondary | ICD-10-CM

## 2022-02-13 DIAGNOSIS — M19041 Primary osteoarthritis, right hand: Secondary | ICD-10-CM

## 2022-02-13 DIAGNOSIS — R252 Cramp and spasm: Secondary | ICD-10-CM

## 2022-02-13 DIAGNOSIS — R296 Repeated falls: Secondary | ICD-10-CM

## 2022-02-13 DIAGNOSIS — L4050 Arthropathic psoriasis, unspecified: Secondary | ICD-10-CM

## 2022-02-13 DIAGNOSIS — Z86711 Personal history of pulmonary embolism: Secondary | ICD-10-CM

## 2022-02-13 DIAGNOSIS — M19072 Primary osteoarthritis, left ankle and foot: Secondary | ICD-10-CM

## 2022-02-13 DIAGNOSIS — Z96652 Presence of left artificial knee joint: Secondary | ICD-10-CM

## 2022-02-13 DIAGNOSIS — Z79899 Other long term (current) drug therapy: Secondary | ICD-10-CM

## 2022-02-13 DIAGNOSIS — M19042 Primary osteoarthritis, left hand: Secondary | ICD-10-CM

## 2022-02-13 DIAGNOSIS — D75839 Thrombocytosis, unspecified: Secondary | ICD-10-CM

## 2022-02-13 DIAGNOSIS — M1711 Unilateral primary osteoarthritis, right knee: Secondary | ICD-10-CM

## 2022-02-13 DIAGNOSIS — Z7901 Long term (current) use of anticoagulants: Secondary | ICD-10-CM

## 2022-02-13 DIAGNOSIS — M5136 Other intervertebral disc degeneration, lumbar region: Secondary | ICD-10-CM

## 2022-02-13 DIAGNOSIS — G5623 Lesion of ulnar nerve, bilateral upper limbs: Secondary | ICD-10-CM

## 2022-02-13 DIAGNOSIS — Z9884 Bariatric surgery status: Secondary | ICD-10-CM

## 2022-02-17 ENCOUNTER — Ambulatory Visit: Payer: Medicare Other | Admitting: Physical Therapy

## 2022-02-17 ENCOUNTER — Ambulatory Visit (HOSPITAL_BASED_OUTPATIENT_CLINIC_OR_DEPARTMENT_OTHER): Payer: Medicare Other | Admitting: Physical Therapy

## 2022-02-19 ENCOUNTER — Other Ambulatory Visit (HOSPITAL_COMMUNITY): Payer: Self-pay

## 2022-02-20 ENCOUNTER — Ambulatory Visit (HOSPITAL_BASED_OUTPATIENT_CLINIC_OR_DEPARTMENT_OTHER): Payer: Medicare Other | Admitting: Physical Therapy

## 2022-02-24 ENCOUNTER — Ambulatory Visit (HOSPITAL_BASED_OUTPATIENT_CLINIC_OR_DEPARTMENT_OTHER): Payer: Medicare Other | Admitting: Physical Therapy

## 2022-02-25 ENCOUNTER — Other Ambulatory Visit (HOSPITAL_COMMUNITY): Payer: Self-pay

## 2022-02-27 ENCOUNTER — Ambulatory Visit (HOSPITAL_BASED_OUTPATIENT_CLINIC_OR_DEPARTMENT_OTHER): Payer: Medicare Other | Admitting: Physical Therapy

## 2022-03-03 ENCOUNTER — Other Ambulatory Visit (HOSPITAL_COMMUNITY): Payer: Self-pay

## 2022-03-03 ENCOUNTER — Ambulatory Visit: Payer: Medicare Other | Admitting: Physical Therapy

## 2022-03-03 ENCOUNTER — Ambulatory Visit (HOSPITAL_BASED_OUTPATIENT_CLINIC_OR_DEPARTMENT_OTHER): Payer: Medicare Other | Admitting: Physical Therapy

## 2022-03-04 ENCOUNTER — Ambulatory Visit: Payer: Medicare Other | Attending: Rheumatology | Admitting: Rheumatology

## 2022-03-05 ENCOUNTER — Ambulatory Visit (INDEPENDENT_AMBULATORY_CARE_PROVIDER_SITE_OTHER): Payer: Medicare Other

## 2022-03-05 ENCOUNTER — Ambulatory Visit: Payer: Medicare Other | Attending: Rheumatology | Admitting: Rheumatology

## 2022-03-05 DIAGNOSIS — M79642 Pain in left hand: Secondary | ICD-10-CM

## 2022-03-05 DIAGNOSIS — L4050 Arthropathic psoriasis, unspecified: Secondary | ICD-10-CM

## 2022-03-05 DIAGNOSIS — M79641 Pain in right hand: Secondary | ICD-10-CM

## 2022-03-05 DIAGNOSIS — M19042 Primary osteoarthritis, left hand: Secondary | ICD-10-CM

## 2022-03-05 DIAGNOSIS — M19041 Primary osteoarthritis, right hand: Secondary | ICD-10-CM

## 2022-03-05 NOTE — Progress Notes (Signed)
Visit diagnosis: Pain in both hands  Connie Hoffman is 61 years old female with history of psoriatic arthritis and osteoarthritis.  She has been experiencing increased pain and discomfort in her bilateral hands.  She came today to get ultrasound of bilateral hands to evaluate for synovitis or tenosynovitis.   Ultrasound examination of bilateral hands was performed per EULAR recommendations. Using 15 MHz transducer, grayscale and power Doppler bilateral second, third, and fifth MCP joints and bilateral wrist joints both dorsal and volar aspects were evaluated to look for synovitis or tenosynovitis. The findings were there was no synovitis or tenosynovitis on ultrasound examination.  Running of all PIP and DIP joints was noted.  Right median nerve was 0.09 cm squares which was the normal limits and left median nerve was 0.12 cm squares which was normal limits.  Impression: Ultrasound examination did not show any synovitis or tenosynovitis.  Bilateral median nerves are within normal limits.   Ultrasound findings were discussed with the patient.  She voiced understanding.  Bo Merino, MD

## 2022-03-06 ENCOUNTER — Other Ambulatory Visit: Payer: Medicare Other | Admitting: Rheumatology

## 2022-03-17 ENCOUNTER — Other Ambulatory Visit: Payer: Self-pay | Admitting: Orthopaedic Surgery

## 2022-03-24 ENCOUNTER — Other Ambulatory Visit: Payer: Self-pay | Admitting: Physician Assistant

## 2022-03-24 ENCOUNTER — Encounter (HOSPITAL_COMMUNITY): Payer: Self-pay | Admitting: Orthopaedic Surgery

## 2022-03-24 ENCOUNTER — Other Ambulatory Visit (HOSPITAL_COMMUNITY): Payer: Self-pay

## 2022-03-24 DIAGNOSIS — L4050 Arthropathic psoriasis, unspecified: Secondary | ICD-10-CM

## 2022-03-24 DIAGNOSIS — L409 Psoriasis, unspecified: Secondary | ICD-10-CM

## 2022-03-24 MED ORDER — OTEZLA 30 MG PO TABS
30.0000 mg | ORAL_TABLET | Freq: Two times a day (BID) | ORAL | 2 refills | Status: DC
  Filled 2022-03-24: qty 60, 30d supply, fill #0
  Filled 2022-04-21: qty 60, 30d supply, fill #1
  Filled 2022-05-15: qty 60, 30d supply, fill #2

## 2022-03-24 NOTE — Telephone Encounter (Signed)
Next Visit: 05/19/2022  Last Visit: 02/13/2022  Last Fill: 12/30/2021  DX: Psoriatic arthritis   Current Dose per office note 02/13/2022: Otezla 30 mg 1 tablet by mouth twice daily.   Labs: 11/12/2021 Hgb 11.7, MCHC 31.6, Glucose 150, Alk. Phos. 154, ALT 53  Okay to refill Otezla?

## 2022-03-24 NOTE — Progress Notes (Signed)
Called Dr. Pollie Friar office, left message for surgical scheduler requesting instructions regarding pt's Eliquis.

## 2022-03-25 ENCOUNTER — Other Ambulatory Visit: Payer: Self-pay

## 2022-03-25 ENCOUNTER — Other Ambulatory Visit: Payer: Medicare Other | Admitting: Rheumatology

## 2022-03-25 ENCOUNTER — Encounter (HOSPITAL_COMMUNITY): Payer: Self-pay | Admitting: Orthopaedic Surgery

## 2022-03-25 NOTE — Progress Notes (Signed)
SDW CALL  Patient was given pre-op instructions over the phone. Patient verbalized understanding of instructions provided.   PCP - Dr. Cathi Roan Cardiologist - Dr. Otho Perl  Chest x-ray - 10-22-2020 EKG - 11-03-21 Stress Test -  ECHO - 02-15-2021 Cardiac Cath - 03-21-2021  Blood Thinner Instructions: Pt didn't receive instructions from provider, but reports Last Dose Eliquis was 11/27   ERAS Protcol - clears until 0930 PRE-SURGERY Ensure or G2-   COVID TEST- no   Anesthesia review: YES, cardiac/medical hx  Patient denies shortness of breath, fever, cough and chest pain over the phone call.  Pt with a history of chest pain syndrome. Reports that since her Isosorbide dose was increased, she has not experienced any chest pain in the last "few" weeks.  Pt is a Jehovah's Witness, refuses blood products. Blood product refusal placed in chart to sign DOS.

## 2022-03-25 NOTE — Anesthesia Preprocedure Evaluation (Signed)
Anesthesia Evaluation  Patient identified by MRN, date of birth, ID band Patient awake    Reviewed: Allergy & Precautions, H&P , NPO status , Patient's Chart, lab work & pertinent test results, reviewed documented beta blocker date and time   Airway Mallampati: II   Neck ROM: full    Dental  (+) Teeth Intact   Pulmonary PE (1/23)   breath sounds clear to auscultation       Cardiovascular hypertension, Pt. on home beta blockers + angina   Rhythm:regular Rate:Normal  Cardiac cath 03/21/21 ([facility]): Impression:  Normal coronary arteries  Moderately elevated LVEDP    Echo 02/15/21 ([facility]) SUMMARY  Normal biventricular size and function, LVEF 60-65%  No significant valvular heart disease, no pericardial effusion. Trace aortic regurgitation. Trace tricuspid regurgitation. Trace pulmonic regurgitation.  No prior study for comparison.      Neuro/Psych  Headaches, Seizures -, Well Controlled,  PSYCHIATRIC DISORDERS Anxiety        GI/Hepatic ,GERD  Medicated,,  Endo/Other    Morbid obesity  Renal/GU Renal disease (Renal artery aneurysm)     Musculoskeletal  (+) Arthritis ,  RIGHT MID SUBSTANCE ACHILLES TENDINOSIS, PARTIAL ACHILLES TENDON TEAR HAGLUND'S DEFORMITY   Abdominal   Peds  Hematology  (+) Blood dyscrasia (Eliquis) , REFUSES BLOOD PRODUCTS, JEHOVAH'S WITNESS  Anesthesia Other Findings   Reproductive/Obstetrics                             Anesthesia Physical Anesthesia Plan  ASA: 3  Anesthesia Plan: General   Post-op Pain Management: Regional block* and Gabapentin PO (pre-op)*   Induction: Intravenous  PONV Risk Score and Plan: 3 and Ondansetron, Dexamethasone, Midazolam and Treatment may vary due to age or medical condition  Airway Management Planned: Oral ETT  Additional Equipment:   Intra-op Plan:   Post-operative Plan: Extubation in OR  Informed Consent: I have  reviewed the patients History and Physical, chart, labs and discussed the procedure including the risks, benefits and alternatives for the proposed anesthesia with the patient or authorized representative who has indicated his/her understanding and acceptance.     Dental advisory given  Plan Discussed with: CRNA, Anesthesiologist and Surgeon  Anesthesia Plan Comments: (PAT note written 03/25/2022 by Myra Gianotti, PA-C.  )       Anesthesia Quick Evaluation

## 2022-03-25 NOTE — Progress Notes (Signed)
Anesthesia Chart Review: SAME DAY WORK-UP  Case: [id number] Date/Time: 03/26/22 1215   Procedures:      RIGHT ACHILLES TENDON RECONSTRUCTION AND DEBRIDEMENT (Right) - LENGTH OF SURGERY: 120 MINUTES     FLEXOR HALLUCIS LONGUS TENDON TRANSFER (Right)     GASTROCNEMIUS RECESSION, CALCANEAL EXOSTECTOMY (Right)   Anesthesia type: General   Pre-op diagnosis: RIGHT MID SUBSTANCE ACHILLES TENDINOSIS, PARTIAL ACHILLES TENDON TEAR HAGLUND'S DEFORMITY   Location: [facility] OR ROOM 02 / [facility]   Surgeons: Erle Crocker, MD       DISCUSSION: Patient is a 61 year old female scheduled for the above procedure.  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 Jehovah's Witness.   She is a same day work-up. Anesthesia team to evaluate on the day of surgery. She reported last Eliquis dose was 03/23/22.   VS:  BP Readings from Last 3 Encounters:  02/13/22 130/81  12/03/21 139/82  11/07/21 (!) 101/39   Pulse Readings from Last 3 Encounters:  02/13/22 60  12/03/21 (!) 57  11/07/21 78     PROVIDERS: Jenel Lucks, PA-C is PCP  Bo Merino, MD is rheumatologist Metta Clines, DO / Narda Amber, DO are neurologists Orlinda Blalock, MD is cardiologist ([facility]). Last visit 02/09/22.  Verner Chol, MD is HEM ([facility]). Last visit 01/01/22. Per note, she was started on Eliquis 04/19/21 for unprovoked multiple subsegmental PE, she had been on aspirin and Eliquis.  Aspirin discontinued after she developed hematuria.  She completed 6 months of  Eliquis at 5 mg twice daily and was reduced to 2.5 mg twice daily.  1 year follow-up planned.   LABS: For labs on arrival as indicated. H/H 11.8/36.8 on 01/01/22 and A1c 5.6% 05/18/21 and CMET on 11/12/21 showed Cr 0.70, AST 34, ALT 53. ([facility])   OTHER: Overnight Awake/Asleep EEG 11/07/21: IMPRESSION: This study is within normal limits. No seizures or epileptiform discharges were seen throughout the recording.   Ambulatory EEG 09/24/21: IMPRESSION: This 66 hour ambulatory EEG is normal.  CLINICAL CORRELATION: A normal EEG does not rule out a clinical diagnosis of epilepsy. Typical events were not captured. If further clinical questions remain, inpatient video EEG monitoring may be helpful.    IMAGES: CTA Chest 06/19/21 ([facility]): IMPRESSION:  - Poor opacification of the upper lobe pulmonary arteries, though it  is noted this patient had prior subsegmental filling defects in the  upper lobes on the prior exam in December 2022. Satisfactory  opacification of the right middle and lower lung arteries. No  evidence of central, lower lobe, or right middle lobe pulmonary  emboli.  - No other acute findings in the chest.    EKG: 11/03/21: NSR   CV: Cardiac cath 03/21/21 ([facility]): Impression:  Normal coronary arteries  Moderately elevated LVEDP   Plan:  1. Secondary prevention of coronary artery disease  2. Medical management of elevated LVEDP    Echo 02/15/21 ([facility]) SUMMARY  Normal biventricular size and function, LVEF 60-65%  No significant valvular heart disease, no pericardial effusion. Trace aortic regurgitation. Trace tricuspid regurgitation. Trace pulmonic regurgitation.  No prior study for comparison.    Past Medical History:  Diagnosis Date  Chest pain syndrome    Chronic stable angina    DDD (degenerative disc disease), lumbar    Headache    Intestinal abscess    Pre-diabetes    Precordial pain    Psoriatic arthritis ([place])    Pulmonary embolism ([facility])     Bilateral lungs   Refusal of blood transfusions as patient is Jehovah's Witness    Renal artery aneurysm [facility])    Sacroiliac pain    Spinal stenosis     Past Surgical History:  Procedure Laterality Date   BACK SURGERY  05/18/2019   L4 and L5   CHOLECYSTECTOMY     CYSTECTOMY  07/2017   JOINT REPLACEMENT Left    left knee   LAPAROSCOPIC ENDOMETRIOSIS FULGURATION  2011   LAPAROSCOPIC GASTRIC SLEEVE RESECTION  2018   LAPAROSCOPIC OOPHERECTOMY Right    LUMBAR EPIDURAL INJECTION  04/13/2018   REVISION TOTAL KNEE ARTHROPLASTY Left 12/2017   TOTAL SHOULDER ARTHROPLASTY Bilateral    TRIGGER FINGER RELEASE Bilateral 2023   bilateral 3rd digit   TUBAL LIGATION     postpartum   ULNAR NERVE REPAIR Bilateral 2023    MEDICATIONS: No current facility-administered medications for this encounter.    albuterol (VENTOLIN HFA) 108 (90 Base) MCG/ACT inhaler   apixaban (ELIQUIS) 2.5 MG TABS tablet   atorvastatin (LIPITOR) 80 MG tablet   dexlansoprazole (DEXILANT) 60 MG capsule   DULoxetine (CYMBALTA) 30 MG capsule   gabapentin (NEURONTIN) 300 MG capsule   isosorbide mononitrate (IMDUR) 30 MG 24 hr tablet   Menthol, Topical Analgesic, (BIOFREEZE) 4 % GEL   methocarbamol (ROBAXIN) 500 MG tablet   metoprolol succinate (TOPROL-XL) 50 MG 24 hr tablet   nitroGLYCERIN (NITROSTAT) 0.4 MG SL tablet   NONFORMULARY OR COMPOUNDED ITEM   pantoprazole (PROTONIX) 40 MG tablet   Polyethylene Glycol 400 (BLINK TEARS) 0.25 % SOLN   sodium chloride (OCEAN) 0.65 % SOLN nasal spray   Apremilast (OTEZLA) 30 MG TABS    Myra Gianotti, PA-C Surgical Short Stay/Anesthesiology [facility] Phone [phone] [facility] Phone [phone] 03/25/2022 1:54 PM

## 2022-03-26 ENCOUNTER — Other Ambulatory Visit: Payer: Self-pay

## 2022-03-26 ENCOUNTER — Ambulatory Visit (HOSPITAL_BASED_OUTPATIENT_CLINIC_OR_DEPARTMENT_OTHER): Payer: Medicare Other | Admitting: Vascular Surgery

## 2022-03-26 ENCOUNTER — Encounter (HOSPITAL_COMMUNITY)
Admission: RE | Disposition: A | Discharge status: Home / Self Care | Payer: Self-pay | Source: Home / Self Care | Attending: Orthopaedic Surgery

## 2022-03-26 ENCOUNTER — Ambulatory Visit (HOSPITAL_COMMUNITY)
Admission: RE | Admit: 2022-03-26 | Discharge: 2022-03-27 | Disposition: A | Discharge status: Home / Self Care | Payer: Medicare Other | Attending: Orthopaedic Surgery | Admitting: Orthopaedic Surgery

## 2022-03-26 ENCOUNTER — Ambulatory Visit (HOSPITAL_COMMUNITY): Payer: Medicare Other | Admitting: Vascular Surgery

## 2022-03-26 ENCOUNTER — Ambulatory Visit (HOSPITAL_COMMUNITY): Payer: Medicare Other

## 2022-03-26 ENCOUNTER — Encounter (HOSPITAL_COMMUNITY): Payer: Self-pay | Admitting: Orthopaedic Surgery

## 2022-03-26 DIAGNOSIS — K219 Gastro-esophageal reflux disease without esophagitis: Secondary | ICD-10-CM | POA: Diagnosis not present

## 2022-03-26 DIAGNOSIS — X58XXXA Exposure to other specified factors, initial encounter: Secondary | ICD-10-CM | POA: Insufficient documentation

## 2022-03-26 DIAGNOSIS — Z7901 Long term (current) use of anticoagulants: Secondary | ICD-10-CM | POA: Insufficient documentation

## 2022-03-26 DIAGNOSIS — I10 Essential (primary) hypertension: Secondary | ICD-10-CM | POA: Insufficient documentation

## 2022-03-26 DIAGNOSIS — G40909 Epilepsy, unspecified, not intractable, without status epilepticus: Secondary | ICD-10-CM | POA: Insufficient documentation

## 2022-03-26 DIAGNOSIS — M6788 Other specified disorders of synovium and tendon, other site: Secondary | ICD-10-CM | POA: Diagnosis present

## 2022-03-26 DIAGNOSIS — M7661 Achilles tendinitis, right leg: Secondary | ICD-10-CM

## 2022-03-26 DIAGNOSIS — S86011A Strain of right Achilles tendon, initial encounter: Secondary | ICD-10-CM | POA: Diagnosis not present

## 2022-03-26 DIAGNOSIS — M899 Disorder of bone, unspecified: Secondary | ICD-10-CM | POA: Diagnosis not present

## 2022-03-26 DIAGNOSIS — Z6841 Body Mass Index (BMI) 40.0 and over, adult: Secondary | ICD-10-CM

## 2022-03-26 HISTORY — PX: ARTHRODESIS FOOT WITH WEIL OSTEOTOMY: SHX5589

## 2022-03-26 HISTORY — DX: Chest pain, unspecified: R07.9

## 2022-03-26 HISTORY — DX: Anxiety disorder, unspecified: F41.9

## 2022-03-26 HISTORY — DX: Hematuria, unspecified: R31.9

## 2022-03-26 HISTORY — DX: Essential (primary) hypertension: I10

## 2022-03-26 HISTORY — PX: TENOLYSIS: SHX396

## 2022-03-26 HISTORY — DX: Reserved for inherently not codable concepts without codable children: IMO0001

## 2022-03-26 HISTORY — DX: Headache, unspecified: R51.9

## 2022-03-26 HISTORY — DX: Anemia, unspecified: D64.9

## 2022-03-26 HISTORY — DX: Sacrococcygeal disorders, not elsewhere classified: M53.3

## 2022-03-26 HISTORY — DX: Procedure and treatment not carried out because of patient's decision for reasons of belief and group pressure: Z53.1

## 2022-03-26 HISTORY — DX: Precordial pain: R07.2

## 2022-03-26 HISTORY — DX: Nontoxic single thyroid nodule: E04.1

## 2022-03-26 HISTORY — DX: Prediabetes: R73.03

## 2022-03-26 HISTORY — DX: Gastritis, unspecified, without bleeding: K29.70

## 2022-03-26 LAB — CBC
HCT: 37.3 % (ref 36.0–46.0)
Hemoglobin: 12.1 g/dL (ref 12.0–15.0)
MCH: 28.8 pg (ref 26.0–34.0)
MCHC: 32.4 g/dL (ref 30.0–36.0)
MCV: 88.8 fL (ref 80.0–100.0)
Platelets: 337 10*3/uL (ref 150–400)
RBC: 4.2 MIL/uL (ref 3.87–5.11)
RDW: 13.1 % (ref 11.5–15.5)
WBC: 6.2 10*3/uL (ref 4.0–10.5)
nRBC: 0 % (ref 0.0–0.2)

## 2022-03-26 LAB — NO BLOOD PRODUCTS

## 2022-03-26 LAB — BASIC METABOLIC PANEL
Anion gap: 8 (ref 5–15)
BUN: 8 mg/dL (ref 6–20)
CO2: 25 mmol/L (ref 22–32)
Calcium: 8.9 mg/dL (ref 8.9–10.3)
Chloride: 107 mmol/L (ref 98–111)
Creatinine, Ser: 0.79 mg/dL (ref 0.44–1.00)
GFR, Estimated: >60 mL/min (ref >60)
Glucose, Bld: 136 mg/dL — ABNORMAL HIGH (ref 70–99)
Potassium: 4.3 mmol/L (ref 3.5–5.1)
Sodium: 140 mmol/L (ref 135–145)

## 2022-03-26 SURGERY — INCISION, TENDON SHEATH
Anesthesia: Regional | Laterality: Right

## 2022-03-26 MED ORDER — FENTANYL CITRATE (PF) 100 MCG/2ML IJ SOLN: 50.0000 ug | Freq: Once | INTRAMUSCULAR | Status: AC

## 2022-03-26 MED ORDER — HYDROCODONE-ACETAMINOPHEN 5-325 MG PO TABS: 1.0000 | ORAL_TABLET | ORAL | Status: DC | PRN

## 2022-03-26 MED ORDER — CEFAZOLIN SODIUM-DEXTROSE 2-4 GM/100ML-% IV SOLN
2.0000 g | Freq: Four times a day (QID) | INTRAVENOUS | Status: AC
  Administered 2022-03-26 – 2022-03-27 (×3): 2 g via INTRAVENOUS
  Filled 2022-03-26 (×3): qty 100

## 2022-03-26 MED ORDER — SUGAMMADEX SODIUM 200 MG/2ML IV SOLN
INTRAVENOUS | Status: DC | PRN
  Administered 2022-03-26 (×4): 100 mg via INTRAVENOUS

## 2022-03-26 MED ORDER — LIDOCAINE 2% (20 MG/ML) 5 ML SYRINGE
INTRAMUSCULAR | Status: AC
  Filled 2022-03-26: qty 5

## 2022-03-26 MED ORDER — NITROGLYCERIN 0.4 MG SL SUBL: 0.4000 mg | SUBLINGUAL_TABLET | SUBLINGUAL | Status: DC | PRN

## 2022-03-26 MED ORDER — CHLORHEXIDINE GLUCONATE 0.12 % MT SOLN
15.0000 mL | Freq: Once | OROMUCOSAL | Status: AC
  Administered 2022-03-26: 15 mL via OROMUCOSAL
  Filled 2022-03-26: qty 15

## 2022-03-26 MED ORDER — GABAPENTIN 300 MG PO CAPS
300.0000 mg | ORAL_CAPSULE | Freq: Three times a day (TID) | ORAL | Status: DC
  Administered 2022-03-26 – 2022-03-27 (×3): 300 mg via ORAL
  Filled 2022-03-26 (×3): qty 1

## 2022-03-26 MED ORDER — APIXABAN 2.5 MG PO TABS
2.5000 mg | ORAL_TABLET | Freq: Two times a day (BID) | ORAL | Status: DC
  Administered 2022-03-27: 2.5 mg via ORAL
  Filled 2022-03-26: qty 1

## 2022-03-26 MED ORDER — DOCUSATE SODIUM 100 MG PO CAPS
100.0000 mg | ORAL_CAPSULE | Freq: Two times a day (BID) | ORAL | Status: DC
  Filled 2022-03-26 (×2): qty 1

## 2022-03-26 MED ORDER — METOCLOPRAMIDE HCL 5 MG/ML IJ SOLN: 5.0000 mg | Freq: Three times a day (TID) | INTRAMUSCULAR | Status: DC | PRN

## 2022-03-26 MED ORDER — MIDAZOLAM HCL 2 MG/2ML IJ SOLN
INTRAMUSCULAR | Status: DC | PRN
  Administered 2022-03-26: 2 mg via INTRAVENOUS

## 2022-03-26 MED ORDER — MORPHINE SULFATE (PF) 2 MG/ML IV SOLN: 0.5000 mg | INTRAVENOUS | Status: DC | PRN

## 2022-03-26 MED ORDER — LACTATED RINGERS IV SOLN: INTRAVENOUS | Status: DC

## 2022-03-26 MED ORDER — ISOSORBIDE MONONITRATE ER 60 MG PO TB24
60.0000 mg | ORAL_TABLET | Freq: Every day | ORAL | Status: DC
  Administered 2022-03-27: 60 mg via ORAL
  Filled 2022-03-26: qty 1

## 2022-03-26 MED ORDER — CEFAZOLIN SODIUM-DEXTROSE 2-4 GM/100ML-% IV SOLN
2.0000 g | INTRAVENOUS | Status: AC
  Administered 2022-03-26: 3 g via INTRAVENOUS
  Filled 2022-03-26: qty 100

## 2022-03-26 MED ORDER — HYDROCODONE-ACETAMINOPHEN 7.5-325 MG PO TABS: 1.0000 | ORAL_TABLET | ORAL | Status: DC | PRN

## 2022-03-26 MED ORDER — ONDANSETRON HCL 4 MG/2ML IJ SOLN: 4.0000 mg | Freq: Four times a day (QID) | INTRAMUSCULAR | Status: DC | PRN

## 2022-03-26 MED ORDER — BUPIVACAINE-EPINEPHRINE (PF) 0.5% -1:200000 IJ SOLN
INTRAMUSCULAR | Status: AC
  Filled 2022-03-26: qty 30

## 2022-03-26 MED ORDER — DULOXETINE HCL 30 MG PO CPEP
30.0000 mg | ORAL_CAPSULE | Freq: Every day | ORAL | Status: DC
  Administered 2022-03-26: 30 mg via ORAL
  Filled 2022-03-26: qty 1

## 2022-03-26 MED ORDER — ALBUTEROL SULFATE (2.5 MG/3ML) 0.083% IN NEBU: 3.0000 mL | INHALATION_SOLUTION | Freq: Four times a day (QID) | RESPIRATORY_TRACT | Status: DC | PRN

## 2022-03-26 MED ORDER — LIDOCAINE 2% (20 MG/ML) 5 ML SYRINGE
INTRAMUSCULAR | Status: DC | PRN
  Administered 2022-03-26: 60 mg via INTRAVENOUS

## 2022-03-26 MED ORDER — ACETAMINOPHEN 500 MG PO TABS
500.0000 mg | ORAL_TABLET | Freq: Four times a day (QID) | ORAL | Status: DC
  Administered 2022-03-26 – 2022-03-27 (×3): 500 mg via ORAL
  Filled 2022-03-26 (×3): qty 1

## 2022-03-26 MED ORDER — FENTANYL CITRATE (PF) 250 MCG/5ML IJ SOLN
INTRAMUSCULAR | Status: AC
  Filled 2022-03-26: qty 5

## 2022-03-26 MED ORDER — VANCOMYCIN HCL 500 MG IV SOLR
INTRAVENOUS | Status: AC
  Filled 2022-03-26: qty 10

## 2022-03-26 MED ORDER — PANTOPRAZOLE SODIUM 40 MG PO TBEC
40.0000 mg | DELAYED_RELEASE_TABLET | Freq: Every day | ORAL | Status: DC
  Administered 2022-03-27: 40 mg via ORAL
  Filled 2022-03-26: qty 1

## 2022-03-26 MED ORDER — DIPHENHYDRAMINE HCL 12.5 MG/5ML PO ELIX: 12.5000 mg | ORAL_SOLUTION | ORAL | Status: DC | PRN

## 2022-03-26 MED ORDER — MIDAZOLAM HCL 2 MG/2ML IJ SOLN
INTRAMUSCULAR | Status: AC
  Administered 2022-03-26: 2 mg via INTRAVENOUS
  Filled 2022-03-26: qty 2

## 2022-03-26 MED ORDER — MIDAZOLAM HCL 2 MG/2ML IJ SOLN
INTRAMUSCULAR | Status: AC
  Filled 2022-03-26: qty 2

## 2022-03-26 MED ORDER — METHOCARBAMOL 1000 MG/10ML IJ SOLN: 500.0000 mg | Freq: Four times a day (QID) | INTRAVENOUS | Status: DC | PRN

## 2022-03-26 MED ORDER — OXYCODONE HCL 5 MG PO TABS: 5.0000 mg | ORAL_TABLET | Freq: Once | ORAL | Status: DC | PRN

## 2022-03-26 MED ORDER — METOCLOPRAMIDE HCL 5 MG PO TABS: 5.0000 mg | ORAL_TABLET | Freq: Three times a day (TID) | ORAL | Status: DC | PRN

## 2022-03-26 MED ORDER — FENTANYL CITRATE (PF) 250 MCG/5ML IJ SOLN
INTRAMUSCULAR | Status: DC | PRN
  Administered 2022-03-26: 100 ug via INTRAVENOUS

## 2022-03-26 MED ORDER — ATORVASTATIN CALCIUM 80 MG PO TABS
80.0000 mg | ORAL_TABLET | Freq: Every day | ORAL | Status: DC
  Administered 2022-03-26 – 2022-03-27 (×2): 80 mg via ORAL
  Filled 2022-03-26 (×2): qty 1

## 2022-03-26 MED ORDER — ROCURONIUM BROMIDE 10 MG/ML (PF) SYRINGE
PREFILLED_SYRINGE | INTRAVENOUS | Status: DC | PRN
  Administered 2022-03-26: 60 mg via INTRAVENOUS

## 2022-03-26 MED ORDER — DEXAMETHASONE SODIUM PHOSPHATE 10 MG/ML IJ SOLN
INTRAMUSCULAR | Status: DC | PRN
  Administered 2022-03-26: 10 mg via INTRAVENOUS

## 2022-03-26 MED ORDER — PROPOFOL 10 MG/ML IV BOLUS
INTRAVENOUS | Status: DC | PRN
  Administered 2022-03-26: 160 mg via INTRAVENOUS

## 2022-03-26 MED ORDER — METOPROLOL SUCCINATE ER 50 MG PO TB24
50.0000 mg | ORAL_TABLET | Freq: Every day | ORAL | Status: DC
  Administered 2022-03-27: 50 mg via ORAL
  Filled 2022-03-26: qty 1

## 2022-03-26 MED ORDER — PROPOFOL 10 MG/ML IV BOLUS
INTRAVENOUS | Status: AC
  Filled 2022-03-26: qty 20

## 2022-03-26 MED ORDER — FENTANYL CITRATE (PF) 100 MCG/2ML IJ SOLN
INTRAMUSCULAR | Status: AC
  Administered 2022-03-26: 50 ug via INTRAVENOUS
  Filled 2022-03-26: qty 2

## 2022-03-26 MED ORDER — DEXAMETHASONE SODIUM PHOSPHATE 10 MG/ML IJ SOLN
INTRAMUSCULAR | Status: AC
  Filled 2022-03-26: qty 1

## 2022-03-26 MED ORDER — ORAL CARE MOUTH RINSE: 15.0000 mL | Freq: Once | OROMUCOSAL | Status: AC

## 2022-03-26 MED ORDER — 0.9 % SODIUM CHLORIDE (POUR BTL) OPTIME
TOPICAL | Status: DC | PRN
  Administered 2022-03-26: 1000 mL

## 2022-03-26 MED ORDER — BUPIVACAINE HCL (PF) 0.5 % IJ SOLN
INTRAMUSCULAR | Status: DC | PRN
  Administered 2022-03-26: 10 mL via PERINEURAL
  Administered 2022-03-26: 5 mL via PERINEURAL

## 2022-03-26 MED ORDER — BUPIVACAINE LIPOSOME 1.3 % IJ SUSP
INTRAMUSCULAR | Status: DC | PRN
  Administered 2022-03-26 (×2): 5 mL via PERINEURAL

## 2022-03-26 MED ORDER — ONDANSETRON HCL 4 MG/2ML IJ SOLN
INTRAMUSCULAR | Status: DC | PRN
  Administered 2022-03-26: 4 mg via INTRAVENOUS

## 2022-03-26 MED ORDER — OXYCODONE HCL 5 MG/5ML PO SOLN: 5.0000 mg | Freq: Once | ORAL | Status: DC | PRN

## 2022-03-26 MED ORDER — METHOCARBAMOL 500 MG PO TABS
500.0000 mg | ORAL_TABLET | Freq: Four times a day (QID) | ORAL | Status: DC | PRN
  Administered 2022-03-27 (×2): 500 mg via ORAL
  Filled 2022-03-26 (×2): qty 1

## 2022-03-26 MED ORDER — ONDANSETRON HCL 4 MG/2ML IJ SOLN
INTRAMUSCULAR | Status: AC
  Filled 2022-03-26: qty 2

## 2022-03-26 MED ORDER — MIDAZOLAM HCL 2 MG/2ML IJ SOLN: 2.0000 mg | Freq: Once | INTRAMUSCULAR | Status: AC

## 2022-03-26 MED ORDER — FENTANYL CITRATE (PF) 100 MCG/2ML IJ SOLN: 25.0000 ug | INTRAMUSCULAR | Status: DC | PRN

## 2022-03-26 MED ORDER — ROCURONIUM BROMIDE 10 MG/ML (PF) SYRINGE
PREFILLED_SYRINGE | INTRAVENOUS | Status: AC
  Filled 2022-03-26: qty 10

## 2022-03-26 MED ORDER — ACETAMINOPHEN 325 MG PO TABS: 325.0000 mg | ORAL_TABLET | Freq: Four times a day (QID) | ORAL | Status: DC | PRN

## 2022-03-26 MED ORDER — BUPIVACAINE LIPOSOME 1.3 % IJ SUSP: INTRAMUSCULAR | Status: DC | PRN

## 2022-03-26 MED ORDER — ONDANSETRON HCL 4 MG PO TABS: 4.0000 mg | ORAL_TABLET | Freq: Four times a day (QID) | ORAL | Status: DC | PRN

## 2022-03-26 SURGICAL SUPPLY — 69 items
BAG COUNTER SPONGE SURGICOUNT (BAG) IMPLANT
BANDAGE ESMARK 6X9 LF (GAUZE/BANDAGES/DRESSINGS) IMPLANT
BENZOIN TINCTURE PRP APPL 2/3 (GAUZE/BANDAGES/DRESSINGS) IMPLANT
BLADE LONG MED 31X9 (MISCELLANEOUS) ×1 IMPLANT
BLADE SURG 15 STRL LF DISP TIS (BLADE) ×3 IMPLANT
BLADE SURG 15 STRL SS (BLADE) ×3
BNDG ELASTIC 6X10 VLCR STRL LF (GAUZE/BANDAGES/DRESSINGS) ×1 IMPLANT
BNDG ELASTIC 6X5.8 VLCR STR LF (GAUZE/BANDAGES/DRESSINGS) ×2 IMPLANT
BNDG ESMARK 4X9 LF (GAUZE/BANDAGES/DRESSINGS) IMPLANT
BNDG ESMARK 6X9 LF (GAUZE/BANDAGES/DRESSINGS)
CHLORAPREP W/TINT 26 (MISCELLANEOUS) ×1 IMPLANT
COVER BACK TABLE 60X90IN (DRAPES) ×1 IMPLANT
COVER SURGICAL LIGHT HANDLE (MISCELLANEOUS) ×1 IMPLANT
CUFF TOURN SGL QUICK 34 (TOURNIQUET CUFF) ×1
CUFF TRNQT CYL 34X4.125X (TOURNIQUET CUFF) ×1 IMPLANT
DRAPE C-ARM 42X72 X-RAY (DRAPES) ×1 IMPLANT
DRAPE OEC MINIVIEW 54X84 (DRAPES) IMPLANT
DRAPE U-SHAPE 47X51 STRL (DRAPES) ×1 IMPLANT
DRSG XEROFORM 1X8 (GAUZE/BANDAGES/DRESSINGS) IMPLANT
ELECT REM PT RETURN 9FT ADLT (ELECTROSURGICAL) ×1
ELECTRODE REM PT RTRN 9FT ADLT (ELECTROSURGICAL) ×1 IMPLANT
FHL IMPLANT SYSTEM 6.25 (Anchor) ×1 IMPLANT
GAUZE PAD ABD 8X10 STRL (GAUZE/BANDAGES/DRESSINGS) IMPLANT
GAUZE SPONGE 4X4 12PLY STRL (GAUZE/BANDAGES/DRESSINGS) ×1 IMPLANT
GAUZE SPONGE 4X4 12PLY STRL LF (GAUZE/BANDAGES/DRESSINGS) ×1 IMPLANT
GAUZE XEROFORM 1X8 LF (GAUZE/BANDAGES/DRESSINGS) ×1 IMPLANT
GLOVE BIOGEL M STRL SZ7.5 (GLOVE) ×4 IMPLANT
GLOVE BIOGEL PI IND STRL 8 (GLOVE) ×2 IMPLANT
GOWN STRL REUS W/ TWL LRG LVL3 (GOWN DISPOSABLE) ×1 IMPLANT
GOWN STRL REUS W/ TWL XL LVL3 (GOWN DISPOSABLE) ×2 IMPLANT
GOWN STRL REUS W/TWL LRG LVL3 (GOWN DISPOSABLE) ×1
GOWN STRL REUS W/TWL XL LVL3 (GOWN DISPOSABLE) ×2
IMPL SYS BIOCOMP ACH SPEED (Anchor) IMPLANT
IMPLANT SYS BIOCOMP ACH SPEED (Anchor) ×1 IMPLANT
KIT BASIN OR (CUSTOM PROCEDURE TRAY) ×1 IMPLANT
KIT BIOCARTILAGE LG JOINT MIX (KITS) IMPLANT
KIT SIZER GRAFT TENODESIS SUTR (KITS) IMPLANT
NDL SUT 6 .5 CRC .975X.05 MAYO (NEEDLE) IMPLANT
NDL TAPERED W/ NITINOL LOOP (MISCELLANEOUS) ×1 IMPLANT
NEEDLE MAYO TAPER (NEEDLE)
NEEDLE TAPERED W/ NITINOL LOOP (MISCELLANEOUS) ×1 IMPLANT
NS IRRIG 1000ML POUR BTL (IV SOLUTION) ×1 IMPLANT
PACK ORTHO EXTREMITY (CUSTOM PROCEDURE TRAY) ×1 IMPLANT
PAD CAST 4YDX4 CTTN HI CHSV (CAST SUPPLIES) ×1 IMPLANT
PADDING CAST COTTON 4X4 STRL (CAST SUPPLIES) ×1
PADDING CAST SYNTHETIC 4X4 STR (CAST SUPPLIES) ×1 IMPLANT
PUTTY DBM ALLOSYNC PURE 5CC (Putty) IMPLANT
SCOTCHCAST PLUS 4X4 WHITE (CAST SUPPLIES) IMPLANT
SLEEVE SCD COMPRESS KNEE MED (STOCKING) ×1 IMPLANT
SPIKE FLUID TRANSFER (MISCELLANEOUS) IMPLANT
SPLINT PLASTER CAST FAST 5X30 (CAST SUPPLIES) ×20 IMPLANT
SPONGE T-LAP 18X18 ~~LOC~~+RFID (SPONGE) ×1 IMPLANT
STRIP CLOSURE SKIN 1/2X4 (GAUZE/BANDAGES/DRESSINGS) IMPLANT
SUCTION FRAZIER HANDLE 10FR (MISCELLANEOUS) ×1
SUCTION TUBE FRAZIER 10FR DISP (MISCELLANEOUS) ×1 IMPLANT
SUT ETHILON 3 0 PS 1 (SUTURE) ×1 IMPLANT
SUT FIBERWIRE 2-0 18 17.9 3/8 (SUTURE)
SUT MNCRL AB 3-0 PS2 18 (SUTURE) ×1 IMPLANT
SUT PDS AB 2-0 CT2 27 (SUTURE) ×1 IMPLANT
SUT VIC AB 0 CT1 27 (SUTURE)
SUT VIC AB 0 CT1 27XBRD ANBCTR (SUTURE) IMPLANT
SUT VIC AB 3-0 FS2 27 (SUTURE) IMPLANT
SUTURE FIBERWR 2-0 18 17.9 3/8 (SUTURE) IMPLANT
SYR 5ML LL (SYRINGE) ×1 IMPLANT
SYR BULB EAR ULCER 3OZ GRN STR (SYRINGE) ×1 IMPLANT
SYSTEM IMPLANT FHL 6.25 (Anchor) IMPLANT
TOWEL GREEN STERILE FF (TOWEL DISPOSABLE) ×2 IMPLANT
TUBE CONNECTING 20X1/4 (TUBING) ×2 IMPLANT
UNDERPAD 30X36 HEAVY ABSORB (UNDERPADS AND DIAPERS) ×1 IMPLANT

## 2022-03-26 NOTE — H&P (Signed)
PREOPERATIVE H&P  Chief Complaint: Right chronic distal Achilles tendon rupture  HPI: Connie Hoffman is a 61 y.o. 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  She also has equinus contracture.  She has failed conservative treatment and is here today for surgery.. Symptoms are rated as moderate to severe, and have been worsening.  This is significantly impairing activities of daily living.  She has elected for surgical management.   Past Medical History:  Diagnosis Date   Anemia    Anxiety    Chest pain syndrome    Chronic stable angina    DDD (degenerative disc disease), lumbar    Gastritis    Headache    Hematuria    resolved after removing ASA, only on Eliquis now   Hypertension    Intestinal abscess    Pre-diabetes    Precordial pain    Psoriatic arthritis ([place])    Pulmonary embolism (HCC)    Bilateral lungs   Refusal of blood transfusions as patient is Jehovah's Witness    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CC)    Sacroiliac pain    Spinal stenosis    Thyroid nodule    Past Surgical History:  Procedure Laterality Date   BACK SURGERY  05/18/2019   L4 and L5   CHOLECYSTECTOMY     CYSTECTOMY  07/2017   JOINT REPLACEMENT Left    left knee   LAPAROSCOPIC ENDOMETRIOSIS FULGURATION  2011   LAPAROSCOPIC GASTRIC SLEEVE RESECTION  2018   LAPAROSCOPIC OOPHERECTOMY Right    LUMBAR EPIDURAL INJECTION  04/13/2018   REVISION TOTAL KNEE ARTHROPLASTY Left 12/2017   TOTAL SHOULDER ARTHROPLASTY Bilateral    TRIGGER FINGER RELEASE Bilateral 2023   bilateral 3rd digit   TUBAL LIGATION     postpartum   ULNAR NERVE REPAIR Bilateral 2023   Social History   Socioeconomic History   Marital status: Married    Spouse name: Not on file   Number of children: Not on file   Years of education: Not on file   Highest education level: Not on file  Occupational History   Not on file   Tobacco Use   Smoking status: Never    Passive exposure: Past   Smokeless tobacco: Never  Vaping Use   Vaping Use: Never used  Substance and Sexual Activity   Alcohol use: Never   Drug use: Never   Sexual activity: Yes    Birth control/protection: Surgical, None  Other Topics Concern   Not on file  Social History Narrative   Right handed    Lives with husband and 1 daughters    Social Determinants of Health   Financial Resource Strain: Not on file  Food Insecurity: No Food Insecurity (12/04/2019)   Hunger Vital Sign    Worried About Running Out of Food in the Last Year: Never true    Ran Out of Food in the Last Year: Never true  Transportation Needs: No Transportation Needs (12/04/2019)   PRAPARE - Hydrologist (Medical): No    Lack of Transportation (Non-Medical): No  Physical Activity: Not on file  Stress: Not on file  Social Connections: Not on file   Family History  Problem Relation Age of Onset   Diabetes Mother    Hypertension Mother    Heart Problems Mother    Congestive Heart Failure Mother    Diabetes Father    Hypertension  Father    Psoriasis Father    Stroke Sister    Heart Problems Sister    Seizures Sister    Prostate cancer Brother    Diabetes Brother    Asthma Daughter    Healthy Daughter    Healthy Son    Healthy Son    Allergies  Allergen Reactions   Aspirin Other (See Comments)    GI bleeding   Tylenol [Acetaminophen] Other (See Comments)    Raised patient's liver enzymes   Prior to Admission medications   Medication Sig Start Date End Date Taking? Authorizing Provider  apixaban (ELIQUIS) 2.5 MG TABS tablet Take 2.5 mg by mouth 2 (two) times daily. 04/25/21  Yes [provider]  Apremilast (OTEZLA) 30 MG TABS Take 1 tablet (30 mg total) by mouth in the morning and at bedtime. 03/24/22  Yes Ofilia Neas, PA-C  atorvastatin (LIPITOR) 80 MG tablet Take 80 mg by mouth daily.   Yes [provider]   dexlansoprazole (DEXILANT) 60 MG capsule Take 60 mg by mouth daily.   Yes [provider]  DULoxetine (CYMBALTA) 30 MG capsule Take 30 mg by mouth at bedtime.   Yes [provider]  gabapentin (NEURONTIN) 300 MG capsule Take 1 capsule (300 mg total) by mouth 3 (three) times daily. 06/06/21  Yes Patel, Donika K, DO  isosorbide mononitrate (IMDUR) 30 MG 24 hr tablet Take 60 mg by mouth daily. 10/13/21  Yes [provider]  methocarbamol (ROBAXIN) 500 MG tablet Take 500 mg by mouth 2 (two) times daily as needed for muscle spasms. 06/19/20  Yes [provider]  metoprolol succinate (TOPROL-XL) 50 MG 24 hr tablet Take 50 mg by mouth daily.   Yes [provider]  NONFORMULARY OR COMPOUNDED ITEM Apply 1 Application topically daily as needed (rash, itching). Triamcinolone compounded with unknown   Yes [provider]  pantoprazole (PROTONIX) 40 MG tablet Take 40 mg by mouth daily as needed (Heartburn).   Yes [provider]  Polyethylene Glycol 400 (BLINK TEARS) 0.25 % SOLN Place 1 drop into both eyes 2 (two) times daily.   Yes [provider]  sodium chloride (OCEAN) 0.65 % SOLN nasal spray Place 1 spray into both nostrils 3 (three) times daily.   Yes [provider]  albuterol (VENTOLIN HFA) 108 (90 Base) MCG/ACT inhaler Inhale 2 puffs into the lungs every 6 (six) hours as needed for wheezing. 07/10/21   [provider]  Menthol, Topical Analgesic, (BIOFREEZE) 4 % GEL Apply 1 application  topically daily as needed (pain). Apply to painful sites daily as directed    [provider]  nitroGLYCERIN (NITROSTAT) 0.4 MG SL tablet Place 0.4 mg under the tongue every 5 (five) minutes as needed for chest pain. 02/15/21   [provider]     Positive ROS: All other systems have been reviewed and were otherwise negative with the exception of those mentioned in the HPI and as above.  Physical Exam:  Vitals:    03/26/22 1155 03/26/22 1200  BP: (!) 158/86 (!) 154/77  Pulse: 67 65  Resp: (!) 22 18  Temp:    SpO2: 100% 100%   General: Alert, no acute distress Cardiovascular: No pedal edema Respiratory: No cyanosis, no use of accessory musculature GI: No organomegaly, abdomen is soft and non-tender Skin: No lesions in the area of chief complaint Neurologic: Sensation intact distally Psychiatric: Patient is competent for consent with normal mood and affect Lymphatic: No axillary or  cervical lymphadenopathy  MUSCULOSKELETAL: Right leg with bulbous portion of the distal Achilles tendon and pain from the bulbous portion down to the calcaneus.  Small palpable defect there.  Weakness with plantarflexion testing.  Slight equinus contracture.  Foot is warm and well-perfused with intact sensation.  Assessment: Right chronic distal Achilles tendon rupture with tendinosis and equinus contracture   Plan: Plan for Achilles tendon debridement and reconstruction with FHL transfer and possible gastrocnemius recession.  Patient will be admitted to the hospital tonight for observation.  She will likely be discharged tomorrow after working with physical therapy.  She we will restart her Eliquis tomorrow.  We discussed the risks, benefits and alternatives of surgery which include but are not limited to wound healing complications, infection, nonunion, malunion, need for further surgery, damage to surrounding structures and continued pain.  They understand there is no guarantees to an acceptable outcome.  After weighing these risks they opted to proceed with surgery.     Erle Crocker, MD    03/26/2022 12:48 PM

## 2022-03-26 NOTE — Anesthesia Procedure Notes (Signed)
Procedure Name: Intubation Date/Time: 03/26/2022 1:17 PM  Performed by: Trinna Post., CRNAPre-anesthesia Checklist: Patient identified, Emergency Drugs available, Suction available, Patient being monitored and Timeout performed Patient Re-evaluated:Patient Re-evaluated prior to induction Oxygen Delivery Method: Circle system utilized Preoxygenation: Pre-oxygenation with 100% oxygen Induction Type: IV induction Ventilation: Mask ventilation without difficulty Laryngoscope Size: Mac and 3 Grade View: Grade I Tube type: Oral Tube size: 7.0 mm Number of attempts: 1 Airway Equipment and Method: Stylet Placement Confirmation: ETT inserted through vocal cords under direct vision, positive ETCO2 and breath sounds checked- equal and bilateral Secured at: 22 cm Tube secured with: Tape Dental Injury: Teeth and Oropharynx as per pre-operative assessment

## 2022-03-26 NOTE — Anesthesia Postprocedure Evaluation (Signed)
Anesthesia Post Note  Patient: Connie Hoffman  Procedure(s) Performed: RIGHT ACHILLES TENDON RECONSTRUCTION AND DEBRIDEMENT (Right) FLEXOR HALLUCIS LONGUS TENDON TRANSFER (Right) GASTROCNEMIUS RECESSION, CALCANEAL EXOSTECTOMY (Right)     Patient location during evaluation: PACU Anesthesia Type: Regional and General Level of consciousness: awake and alert Pain management: pain level controlled Vital Signs Assessment: post-procedure vital signs reviewed and stable Respiratory status: spontaneous breathing, nonlabored ventilation, respiratory function stable and patient connected to nasal cannula oxygen Cardiovascular status: blood pressure returned to baseline and stable Postop Assessment: no apparent nausea or vomiting Anesthetic complications: no   No notable events documented.  Last Vitals:  Vitals:   03/26/22 1615 03/26/22 1704  BP: 133/62 139/66  Pulse: 76 73  Resp: 12 16  Temp: 36.7 C 36.4 C  SpO2: 96% 100%    Last Pain:  Vitals:   03/26/22 1704  TempSrc: Oral  PainSc: 0-No pain                 Santa Lighter

## 2022-03-26 NOTE — Anesthesia Procedure Notes (Signed)
Anesthesia Regional Block: Popliteal block   Pre-Anesthetic Checklist: , timeout performed,  Correct Patient, Correct Site, Correct Laterality,  Correct Procedure, Correct Position, site marked,  Risks and benefits discussed,  Surgical consent,  Pre-op evaluation,  At surgeon's request and post-op pain management  Laterality: Right  Prep: chloraprep       Needles:  Injection technique: Single-shot  Needle Type: Echogenic Needle     Needle Length: 9cm  Needle Gauge: 21     Additional Needles:   Procedures:,,,, ultrasound used (permanent image in chart),,    Narrative:  Start time: 03/26/2022 11:50 AM End time: 03/26/2022 11:55 AM Injection made incrementally with aspirations every 5 mL.  Performed by: Personally  Anesthesiologist: Santa Lighter, MD  Additional Notes: No pain on injection. No increased resistance to injection. Injection made in 5cc increments.  Good needle visualization.  Patient tolerated procedure well.

## 2022-03-26 NOTE — Anesthesia Procedure Notes (Signed)
Anesthesia Regional Block: Adductor canal block   Pre-Anesthetic Checklist: , timeout performed,  Correct Patient, Correct Site, Correct Laterality,  Correct Procedure, Correct Position, site marked,  Risks and benefits discussed,  Surgical consent,  Pre-op evaluation,  At surgeon's request and post-op pain management  Laterality: Right  Prep: chloraprep       Needles:  Injection technique: Single-shot  Needle Type: Echogenic Needle     Needle Length: 9cm  Needle Gauge: 21     Additional Needles:   Procedures:,,,, ultrasound used (permanent image in chart),,    Narrative:  Start time: 03/26/2022 11:55 AM End time: 03/26/2022 12:00 PM Injection made incrementally with aspirations every 5 mL.  Performed by: Personally  Anesthesiologist: Santa Lighter, MD  Additional Notes: No pain on injection. No increased resistance to injection. Injection made in 5cc increments.  Good needle visualization.  Patient tolerated procedure well.

## 2022-03-26 NOTE — Evaluation (Signed)
Physical Therapy Evaluation Patient Details Name: Connie Hoffman MRN: [id number] DOB: 1960-08-26 Today's Date: 03/26/2022  History of Present Illness  61 y.o. female presents to [facility] hospital on 03/26/2022 for R achilles tendon reconstruction and debridement. PMH includes anemia, anxiety, DDD, HA, HTN, PE.  Clinical Impression  Pt presents to PT with deficits in functional mobility, gait, balance, ROM, endurance, strength. Pt with chronic L knee flexion ROM deficits, leading to difficulty transferring while maintaining NWB through RLE. Pt is able to transfer well from elevated surfaces, but has difficulty from lower surfaces as she is unable to flex her knee enough, resulting in an impaired ability to get her center of mass over her base of support. Pt is able to navigate within the room on a knee scooter, and will benefit from further gait and transfer training tomorrow morning to improve confidence in mobility. Pt would also like to practice mobilizing on knee scooter over a simulated home threshold. PT recommends HHPT and a RW at this time.       Recommendations for follow up therapy are one component of a multi-disciplinary discharge planning process, led by the attending physician.  Recommendations may be updated based on patient status, additional functional criteria and insurance authorization.  Follow Up Recommendations Home health PT      Assistance Recommended at Discharge Intermittent Supervision/Assistance  Patient can return home with the following  A little help with walking and/or transfers;A little help with bathing/dressing/bathroom;Assistance with cooking/housework;Assist for transportation;Help with stairs or ramp for entrance    Equipment Recommendations Rolling walker (2 wheels)  Recommendations for Other Services       Functional Status Assessment Patient has had a recent decline in their functional status and demonstrates the ability to make significant improvements  in function in a reasonable and predictable amount of time.     Precautions / Restrictions Precautions Precautions: Fall Restrictions Weight Bearing Restrictions: Yes RLE Weight Bearing: Non weight bearing      Mobility  Bed Mobility Overal bed mobility: Modified Independent                  Transfers Overall transfer level: Needs assistance Equipment used: Rolling walker (2 wheels) Transfers: Sit to/from Stand, Bed to chair/wheelchair/BSC Sit to Stand: Min assist, Min guard Stand pivot transfers: Min assist         General transfer comment: minA from lower surfaces, minG from elevated bed    Ambulation/Gait Ambulation/Gait assistance: Min assist Gait Distance (Feet): 40 Feet Assistive device:  (knee scooter)   Gait velocity: reduced Gait velocity interpretation: <1.31 ft/sec, indicative of household ambulator   General Gait Details: pt mobilizes with knee scooter, completing turn in tight quarters with mild leftward LOB requiring PT assist for safety  Stairs Stairs:  (discussed technique to manage threshold at home, pt would like to attempt next session)          Wheelchair Mobility    Modified Rankin (Stroke Patients Only)       Balance Overall balance assessment: Needs assistance Sitting-balance support: No upper extremity supported, Feet supported Sitting balance-Leahy Scale: Good     Standing balance support: Single extremity supported, Reliant on assistive device for balance Standing balance-Leahy Scale: Poor                               Pertinent Vitals/Pain Pain Assessment Pain Assessment: Faces Faces Pain Scale: No hurt    Home Living Family/patient expects  to be discharged to:: Private residence Living Arrangements: Spouse/significant other;Children Available Help at Discharge: Family;Available PRN/intermittently Type of Home: House Home Access: Stairs to enter Entrance Stairs-Rails: None Entrance Stairs-Number  of Steps: 1   Home Layout: One level Home Equipment: BSC/3in1;Wheelchair - manual;Cane - single point (knee scooter)      Prior Function Prior Level of Function : Independent/Modified Independent;Driving             Mobility Comments: enjoys gardening       Hand Dominance        Extremity/Trunk Assessment   Upper Extremity Assessment Upper Extremity Assessment: Overall WFL for tasks assessed    Lower Extremity Assessment Lower Extremity Assessment: LLE deficits/detail LLE Deficits / Details: L knee with limited flexion, active knee flexion to ~75 degrees based on observation    Cervical / Trunk Assessment Cervical / Trunk Assessment: Normal  Communication   Communication: No difficulties  Cognition Arousal/Alertness: Awake/alert Behavior During Therapy: WFL for tasks assessed/performed Overall Cognitive Status: Within Functional Limits for tasks assessed                                          General Comments General comments (skin integrity, edema, etc.): VSS on RA    Exercises     Assessment/Plan    PT Assessment Patient needs continued PT services  PT Problem List Decreased strength;Decreased range of motion;Decreased balance;Decreased mobility;Decreased knowledge of use of DME       PT Treatment Interventions DME instruction;Gait training;Stair training;Functional mobility training;Therapeutic activities;Balance training;Neuromuscular re-education;Patient/family education    PT Goals (Current goals can be found in the Care Plan section)  Acute Rehab PT Goals Patient Stated Goal: to return to independence PT Goal Formulation: With patient/family Time For Goal Achievement: 04/09/22 Potential to Achieve Goals: Fair    Frequency Min 5X/week     Co-evaluation               AM-PAC PT "6 Clicks" Mobility  Outcome Measure Help needed turning from your back to your side while in a flat bed without using bedrails?: None Help  needed moving from lying on your back to sitting on the side of a flat bed without using bedrails?: None Help needed moving to and from a bed to a chair (including a wheelchair)?: A Little Help needed standing up from a chair using your arms (e.g., wheelchair or bedside chair)?: A Little Help needed to walk in hospital room?: Total Help needed climbing 3-5 steps with a railing? : Total 6 Click Score: 16    End of Session   Activity Tolerance: Patient tolerated treatment well Patient left: in chair;with call bell/phone within reach;with family/visitor present Nurse Communication: Mobility status PT Visit Diagnosis: Other abnormalities of gait and mobility (R26.89)    Time: 9326-7124 PT Time Calculation (min) (ACUTE ONLY): 50 min   Charges:   PT Evaluation $PT Eval Low Complexity: 1 Low PT Treatments $Gait Training: 8-22 mins $Therapeutic Activity: 8-22 mins        Zenaida Niece, PT, DPT Acute Rehabilitation Office [phone]   Zenaida Niece 03/26/2022, 6:46 PM

## 2022-03-26 NOTE — Brief Op Note (Signed)
03/26/2022  2:44 PM  PATIENT:  Connie Hoffman  61 y.o. female  PRE-OPERATIVE DIAGNOSIS:  RIGHT MID SUBSTANCE ACHILLES TENDINOSIS, PARTIAL ACHILLES TENDON TEAR HAGLUND'S DEFORMITY  POST-OPERATIVE DIAGNOSIS:  RIGHT MID SUBSTANCE ACHILLES TENDINOSIS, PARTIAL ACHILLES TENDON TEAR HAGLUND'S DEFORMITY  PROCEDURE:  Procedure(s) with comments: RIGHT ACHILLES TENDON RECONSTRUCTION AND DEBRIDEMENT (Right) - LENGTH OF SURGERY: 120 MINUTES FLEXOR HALLUCIS LONGUS TENDON TRANSFER (Right) GASTROCNEMIUS RECESSION, CALCANEAL EXOSTECTOMY (Right)  SURGEON:  Surgeon(s) and Role:    Erle Crocker, MD - Primary  PHYSICIAN ASSISTANT: J Martinique  ASSISTANTS: none   ANESTHESIA:   general  EBL:  15 mL   BLOOD ADMINISTERED:none  DRAINS: none   LOCAL MEDICATIONS USED:  NONE  SPECIMEN:  No Specimen  DISPOSITION OF SPECIMEN:  N/A  COUNTS:  YES  TOURNIQUET:  * Missing tourniquet times found for documented tourniquets in log: [id number] *  DICTATION: .Dragon Dictation  PLAN OF CARE: Admit for overnight observation  PATIENT DISPOSITION:  PACU - hemodynamically stable.   Delay start of Pharmacological VTE agent (>24hrs) due to surgical blood loss or risk of bleeding: Restart tomorrow

## 2022-03-26 NOTE — Transfer of Care (Signed)
Immediate Anesthesia Transfer of Care Note  Patient: Connie Hoffman  Procedure(s) Performed: RIGHT ACHILLES TENDON RECONSTRUCTION AND DEBRIDEMENT (Right) FLEXOR HALLUCIS LONGUS TENDON TRANSFER (Right) GASTROCNEMIUS RECESSION, CALCANEAL EXOSTECTOMY (Right)  Patient Location: PACU  Anesthesia Type:General and Regional  Level of Consciousness: awake, alert , and oriented  Airway & Oxygen Therapy: Patient Spontanous Breathing and Patient connected to nasal cannula oxygen  Post-op Assessment: Report given to RN and Post -op Vital signs reviewed and stable  Post vital signs: Reviewed and stable  Last Vitals:  Vitals Value Taken Time  BP    Temp    Pulse    Resp 16 03/26/22 1517  SpO2    Vitals shown include unvalidated device data.  Last Pain:  Vitals:   03/26/22 1111  TempSrc:   PainSc: 3       Patients Stated Pain Goal: 0 (68/15/94 7076)  Complications: No notable events documented.

## 2022-03-27 ENCOUNTER — Encounter (HOSPITAL_COMMUNITY): Payer: Self-pay | Admitting: Orthopaedic Surgery

## 2022-03-27 DIAGNOSIS — S86011A Strain of right Achilles tendon, initial encounter: Secondary | ICD-10-CM | POA: Diagnosis not present

## 2022-03-27 MED ORDER — OXYCODONE HCL 5 MG PO TABS: 5.0000 mg | ORAL_TABLET | ORAL | 0 refills | Status: DC | PRN

## 2022-03-27 NOTE — TOC Transition Note (Signed)
Transition of Care (TOC) - CM/SW Discharge Note Marvetta Gibbons RN, BSN Transitions of Care Unit 4E- RN Case Manager See Treatment Team for direct phone # [phone] Cross Coverage  Patient Details  Name: Connie Hoffman MRN: [id number] Date of Birth: Feb 24, 1961  Transition of Care [facility]) CM/SW Contact:  Dawayne Patricia, RN Phone Number: 03/27/2022, 3:17 PM   Clinical Narrative:    Pt stable for transition home. Orders placed for [facility] and DME needs.  CM spoke with pt and spouse at bedside. List provided for [facility] choice Per CMS guidelines from [email or web address] website with star ratings (copy placed in shadow chart), Pt has selected [facility] as first choice - and voiced if they are unable to accept then she has no further preference and will defer to CM to secure [facility] on her behalf.   Address, phone # and PCP all confirmed in epic.   Pt confirmed DME- RW need- choice offered for agency and pt agreeable to use in house provider to deliver to room prior to discharge.   Spouse to transport home once DME arrives.   Call made to [facility]- for [facility] referral.- pending review [place] unable to accept do to low staffing this week Call made to [facility]- unable to accept Call made to [facility]- referral has been accepted and [facility] will reach out to pt to schedule this weekend.   Pt called and updated on [facility] arrangements.    Final next level of care: [place] Barriers to Discharge: No Barriers Identified   Patient Goals and CMS Choice Patient states their goals for this hospitalization and ongoing recovery are:: return home CMS Medicare.gov Compare Post Acute Care list provided to:: Patient Choice offered to / list presented to : Patient, Spouse  Discharge Placement               Home w/ [facility]        Discharge Plan and Services   Discharge Planning Services: CM Consult Post Acute Care Choice: Home Health, Durable Medical Equipment          DME Arranged:  Walker rolling DME Agency: [employer] Date DME Agency Contacted: 03/27/22 Time DME Agency Contacted: 33 Representative spoke with at DME Agency: Erasmo Downer [place]: PT, OT, RN, Nurse's Aide [place] Agency: [place] ([place]) Date [place]: 03/27/22 Time [place]: 1516 Representative spoke with at [place]: [place] ([place]) Interventions     Readmission Risk Interventions     No data to display

## 2022-03-27 NOTE — Plan of Care (Signed)

## 2022-03-27 NOTE — Progress Notes (Signed)
     Connie Hoffman is a 61 y.o. female   Orthopaedic diagnosis: Right Achilles tendon reconstruction and debridement with left FHL transfer, Gastrocnemius recession, and calcaneal exostectomy 03/26/2022  Subjective: Patient doing well.  Pain well-controlled.  Nerve block is just now beginning to wear off.  Physical therapy yesterday was found to be suitable for discharge home with home physical therapy.  She can drinking well.  She does have a rolling walker and a knee scooter to maintain nonweightbearing.  Family present in the room.  Objectyive: Vitals:   03/26/22 2113 03/27/22 0547  BP: (!) 127/47 (!) 140/61  Pulse: 100 80  Resp: 18 19  Temp:  98.3 F (36.8 C)  SpO2: 100% 100%     Exam: Awake and alert Respirations even and unlabored No acute distress  Right lower extremity demonstrates well-fitted, clean, and dry short leg cast with the foot plantarflexed.  The cast not removed.  Exposed skin is benign.  She is unable to wiggle the toes secondary to nerve block, but is endorsing some sensation to light touch in the toes.  No tenderness palpation distal or proximal to the cast.   Toes are warm and well-perfused distally.  Assessment: Postop day 1 status post the above, doing well and suitable for discharge from orthopedic standpoint with home health PT.  She would like to see physical therapy again today before discharge.  Plan: -Nonweightbearing right lower extremity -Keep cast clean, dry, and intact -Oxycodone for pain control as needed.  This is been sent to pharmacy. -She will resume her baseline Eliquis, which will serve as DVT prophylaxis  Follow-up outpatient with Dr. Lucia Gaskins 2 weeks from surgery for cast removal and suture removal if appropriate.    Holland Nickson J. [place], PA-C

## 2022-03-27 NOTE — Discharge Summary (Signed)
Patient ID: Avalise Gatling MRN: [id number] DOB/AGE: 1961-04-13 61 y.o.  Admit date: 03/26/2022 Discharge date: 03/27/2022  Admission Diagnoses:  Principal Problem:   Achilles tendonosis of right lower extremity   Discharge Diagnoses:  Same  Past Medical History:  Diagnosis Date   Anemia    Anxiety    Chest pain syndrome    Chronic stable angina    DDD (degenerative disc disease), lumbar    Gastritis    Headache    Hematuria    resolved after removing ASA, only on Eliquis now   Hypertension    Intestinal abscess    Pre-diabetes    Precordial pain    Psoriatic arthritis (HCC)    Pulmonary embolism (HCC)    Bilateral lungs   Refusal of blood transfusions as patient is Jehovah's Witness    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CC)    Sacroiliac pain    Spinal stenosis    Thyroid nodule     Surgeries: Procedure(s): RIGHT ACHILLES TENDON RECONSTRUCTION AND DEBRIDEMENT FLEXOR HALLUCIS LONGUS TENDON TRANSFER GASTROCNEMIUS RECESSION, CALCANEAL EXOSTECTOMY on 03/26/2022   Consultants:   Discharged Condition: Improved  Hospital Course: Yihan Leanos is an 61 y.o. female who was admitted 03/26/2022 for operative treatment ofAchilles tendonosis of right lower extremity. Patient has severe unremitting pain that affects sleep, daily activities, and work/hobbies. After pre-op clearance the patient was taken to the operating room on 03/26/2022 and underwent  Procedure(s): RIGHT ACHILLES TENDON RECONSTRUCTION AND DEBRIDEMENT FLEXOR HALLUCIS LONGUS TENDON TRANSFER GASTROCNEMIUS RECESSION, CALCANEAL EXOSTECTOMY.    Patient was given perioperative antibiotics:  Anti-infectives (From admission, onward)    Start     Dose/Rate Route Frequency Ordered Stop   03/26/22 1930  ceFAZolin (ANCEF) IVPB 2g/100 mL premix        2 g 200 mL/hr over 30 Minutes Intravenous Every 6 hours 03/26/22 1707 03/27/22 1329   03/26/22 1030  ceFAZolin (ANCEF) IVPB 2g/100 mL premix        2 g 200 mL/hr over 30  Minutes Intravenous On call to O.R. 03/26/22 1021 03/26/22 1340        Patient was given sequential compression devices, early ambulation, and chemoprophylaxis to prevent DVT.  Patient benefited maximally from hospital stay and there were no complications.    Recent vital signs: Patient Vitals for the past 24 hrs:  BP Temp Temp src Pulse Resp SpO2 Height Weight  03/27/22 0547 (!) 140/61 98.3 F (36.8 C) Oral 80 19 100 % -- --  03/26/22 2113 (!) 127/47 -- Oral 100 18 100 % -- --  03/26/22 1704 139/66 97.6 F (36.4 C) Oral 73 16 100 % -- --  03/26/22 1615 133/62 98 F (36.7 C) -- 76 12 96 % -- --  03/26/22 1600 (!) 143/66 -- -- 77 15 96 % -- --  03/26/22 1545 (!) 139/59 -- -- 84 20 96 % -- --  03/26/22 1530 126/88 -- -- 82 20 97 % -- --  03/26/22 1520 (!) 145/69 (!) 97.5 F (36.4 C) -- 84 16 96 % -- --  03/26/22 1200 (!) 154/77 -- -- 65 18 100 % -- --  03/26/22 1155 (!) 158/86 -- -- 67 (!) 22 100 % -- --  03/26/22 1150 (!) 163/74 -- -- 66 13 99 % -- --  03/26/22 1145 (!) 150/88 -- -- 73 16 100 % -- --  03/26/22 1125 121/64 -- -- (!) 57 15 98 % -- --  03/26/22 1016 (!) 155/73 98 F (36.7 C) Oral 73 17  98 % 5\' 5"  (1.651 m) 119.3 kg     Recent laboratory studies:  Recent Labs    03/26/22 1057  WBC 6.2  HGB 12.1  HCT 37.3  PLT 337  NA 140  K 4.3  CL 107  CO2 25  BUN 8  CREATININE 0.79  GLUCOSE 136*  CALCIUM 8.9     Discharge Medications:   Allergies as of 03/27/2022       Reactions   Aspirin Other (See Comments)   GI bleeding   Tylenol [acetaminophen] Other (See Comments)   Raised patient's liver enzymes        Medication List     TAKE these medications    albuterol 108 (90 Base) MCG/ACT inhaler Commonly known as: VENTOLIN HFA Inhale 2 puffs into the lungs every 6 (six) hours as needed for wheezing.   apixaban 2.5 MG Tabs tablet Commonly known as: ELIQUIS Take 2.5 mg by mouth 2 (two) times daily.   atorvastatin 80 MG tablet Commonly known as:  LIPITOR Take 80 mg by mouth daily.   Biofreeze 4 % Gel Generic drug: Menthol (Topical Analgesic) Apply 1 application  topically daily as needed (pain). Apply to painful sites daily as directed   Blink Tears 0.25 % Soln Generic drug: Polyethylene Glycol 400 Place 1 drop into both eyes 2 (two) times daily.   dexlansoprazole 60 MG capsule Commonly known as: DEXILANT Take 60 mg by mouth daily.   DULoxetine 30 MG capsule Commonly known as: CYMBALTA Take 30 mg by mouth at bedtime.   gabapentin 300 MG capsule Commonly known as: NEURONTIN Take 1 capsule (300 mg total) by mouth 3 (three) times daily.   isosorbide mononitrate 30 MG 24 hr tablet Commonly known as: IMDUR Take 60 mg by mouth daily.   methocarbamol 500 MG tablet Commonly known as: ROBAXIN Take 500 mg by mouth 2 (two) times daily as needed for muscle spasms.   metoprolol succinate 50 MG 24 hr tablet Commonly known as: TOPROL-XL Take 50 mg by mouth daily.   nitroGLYCERIN 0.4 MG SL tablet Commonly known as: NITROSTAT Place 0.4 mg under the tongue every 5 (five) minutes as needed for chest pain.   NONFORMULARY OR COMPOUNDED ITEM Apply 1 Application topically daily as needed (rash, itching). Triamcinolone compounded with unknown   Otezla 30 MG Tabs Generic drug: Apremilast Take 1 tablet (30 mg total) by mouth in the morning and at bedtime.   oxyCODONE 5 MG immediate release tablet Commonly known as: Roxicodone Take 1 tablet (5 mg total) by mouth every 4 (four) hours as needed for severe pain.   pantoprazole 40 MG tablet Commonly known as: PROTONIX Take 40 mg by mouth daily as needed (Heartburn).   sodium chloride 0.65 % Soln nasal spray Commonly known as: OCEAN Place 1 spray into both nostrils 3 (three) times daily.               Discharge Care Instructions  (From admission, onward)           Start     Ordered   03/27/22 0000  Non weight bearing       Question Answer Comment  Laterality right    Extremity Lower      03/27/22 0817            Diagnostic Studies: DG MINI C-ARM IMAGE ONLY  Result Date: 03/26/2022 There is no interpretation for this exam.  This order is for images obtained during a surgical procedure.  Please See "Surgeries"  Tab for more information regarding the procedure.   [place] COMPLETE JOINT SPACE STRUCTURES UP BILAT  Result Date: 03/05/2022 Ultrasound examination of bilateral hands was performed per EULAR recommendations. Using 15 MHz transducer, grayscale and power Doppler bilateral second, third, and fifth MCP joints and bilateral wrist joints both dorsal and volar aspects were evaluated to look for synovitis or tenosynovitis. The findings were there was no synovitis or tenosynovitis on ultrasound examination.  Running of all PIP and DIP joints was noted.  Right median nerve was 0.09 cm squares which was the normal limits and left median nerve was 0.12 cm squares which was normal limits. Impression: Ultrasound examination did not show any synovitis or tenosynovitis.  Bilateral median nerves are within normal limits.   Disposition: Discharge disposition: 01-Home or Self Care     Plan: -Nonweightbearing right lower extremity -Keep cast clean, dry, and intact -Oxycodone for pain control as needed.  This is been sent to pharmacy. -She will resume her baseline Eliquis, which will serve as DVT prophylaxis   Follow-up outpatient with Dr. Susa Simmonds 2 weeks from surgery for cast removal and suture removal if appropriate.  Discharge Instructions     Call MD / Call 911   Complete by: As directed    If you experience chest pain or shortness of breath, CALL 911 and be transported to the hospital emergency room.  If you develope a fever above 101 F, pus (white drainage) or increased drainage or redness at the wound, or calf pain, call your surgeon's office.   Constipation Prevention   Complete by: As directed    Drink plenty of fluids.  Prune juice may be helpful.   You may use a stool softener, such as Colace (over the counter) 100 mg twice a day.  Use MiraLax (over the counter) for constipation as needed.   Diet - low sodium heart healthy   Complete by: As directed    Non weight bearing   Complete by: As directed    Laterality: right   Extremity: Lower   Post-operative opioid taper instructions:   Complete by: As directed    POST-OPERATIVE OPIOID TAPER INSTRUCTIONS: It is important to wean off of your opioid medication as soon as possible. If you do not need pain medication after your surgery it is ok to stop day one. Opioids include: Codeine, Hydrocodone(Norco, Vicodin), Oxycodone(Percocet, oxycontin) and hydromorphone amongst others.  Long term and even short term use of opiods can cause: Increased pain response Dependence Constipation Depression Respiratory depression And more.  Withdrawal symptoms can include Flu like symptoms Nausea, vomiting And more Techniques to manage these symptoms Hydrate well Eat regular healthy meals Stay active Use relaxation techniques(deep breathing, meditating, yoga) Do Not substitute Alcohol to help with tapering If you have been on opioids for less than two weeks and do not have pain than it is ok to stop all together.  Plan to wean off of opioids This plan should start within one week post op of your joint replacement. Maintain the same interval or time between taking each dose and first decrease the dose.  Cut the total daily intake of opioids by one tablet each day Next start to increase the time between doses. The last dose that should be eliminated is the evening dose.           Follow-up Information     Terance Hart, MD Follow up in 2 week(s).   Specialty: Orthopedic Surgery Contact information: [address] [place] [address]  [phone]                  Signed: Aissata Wilmore J [place] 03/27/2022, 8:37 AM

## 2022-03-27 NOTE — Progress Notes (Signed)
Explained discharge instructions to patient. Reviewed follow up appointment and next medication administration times. Also reviewed education. Patient verbalized having an understanding for instructions given. All belongings are in the patient's possession to include her rolling walker. IV was removed.  No other needs verbalized. Transported downstairs for discharge.

## 2022-03-27 NOTE — Evaluation (Signed)
Occupational Therapy Evaluation Patient Details Name: Connie Hoffman MRN: [id number] DOB: Aug 04, 1960 Today's Date: 03/27/2022   History of Present Illness 61 y.o. female presents to [facility] hospital on 03/26/2022 for R achilles tendon reconstruction and debridement. PMH includes anemia, anxiety, DDD, HA, HTN, PE.   Clinical Impression   Patient evaluated by Occupational Therapy with no further acute OT needs identified. All education has been completed and the patient and spouse  have no further questions.  See below for any follow-up Occupational Therapy or equipment needs. OT is signing off. Thank you for this referral.      Recommendations for follow up therapy are one component of a multi-disciplinary discharge planning process, led by the attending physician.  Recommendations may be updated based on patient status, additional functional criteria and insurance authorization.   Follow Up Recommendations  No OT follow up     Assistance Recommended at Discharge Set up Supervision/Assistance  Patient can return home with the following A little help with walking and/or transfers;A little help with bathing/dressing/bathroom;Help with stairs or ramp for entrance    Functional Status Assessment  Patient has had a recent decline in their functional status and demonstrates the ability to make significant improvements in function in a reasonable and predictable amount of time.  Equipment Recommendations  None recommended by OT    Recommendations for Other Services       Precautions / Restrictions Precautions Precautions: Fall Restrictions Weight Bearing Restrictions: Yes RLE Weight Bearing: Non weight bearing      Mobility Bed Mobility Overal bed mobility: Modified Independent                  Transfers                          Balance Overall balance assessment: Needs assistance Sitting-balance support: No upper extremity supported (LT foot  supported) Sitting balance-Leahy Scale: Good     Standing balance support: Single extremity supported, Reliant on assistive device for balance Standing balance-Leahy Scale: Poor                             ADL either performed or assessed with clinical judgement   ADL Overall ADL's : Needs assistance/impaired Eating/Feeding: Independent   Grooming: Sitting Grooming Details (indicate cue type and reason): Pt instructed to sit for grooming to prevent falls. Upper Body Bathing: Sitting;Modified independent   Lower Body Bathing: Supervison/ safety Lower Body Bathing Details (indicate cue type and reason): with shower chair Upper Body Dressing : Set up;Sitting   Lower Body Dressing: Sitting/lateral leans;Sit to/from stand;Min guard Lower Body Dressing Details (indicate cue type and reason): Pt educated on sequence for ease and donned underwear over feet with setup only. Pt used lateral lean to pull partially over hips. OT demonstrated safest method to stand to finish, keeping 1 hand on RW at all times. Pt demonstrated back with Min Guard assist for balance once pt sped up movements. Spouse reminded pt to slow down and pt corrected balance. Toilet Transfer: Magazine features editor Details (indicate cue type and reason): Pt has elevated toilets to compensate for lack of knee flexion.  Pt and spouse also shown furniture risers for home to acoommodate pt's knees and needs with chairs.  Pt stood from elevated EOB to simulate home bed with close supervision and ambulated with RW ~8' with Min guard assist and good compliance with NWB. Toileting- Clothing  Manipulation and Hygiene: Sitting/lateral lean;Min guard;Cueing for compensatory techniques Toileting - Clothing Manipulation Details (indicate cue type and reason): Pt educated on compensatory strategies to assist with hygiene and adaptive equipment options.   Tub/Shower Transfer Details (indicate cue type and reason): Pt and spouse  educated on tub transfer bench and how to position in tub/shower vs sponge bathing. Functional mobility during ADLs: Min guard;Rolling walker (2 wheels);Cueing for safety      IADLS: Pt educated on RW and knee scooter safety in kitchen including how to safely approach cabinets, having a chair or bar stool in kitchen to avoid prolonged standing, and having family bring most used items out to counter top height for increased accesibility.  Recommended family support as possible.       Vision Baseline Vision/History: 0 No visual deficits       Perception     Praxis      Pertinent Vitals/Pain Pain Assessment Pain Assessment: No/denies pain Faces Pain Scale: No hurt     Hand Dominance     Extremity/Trunk Assessment Upper Extremity Assessment Upper Extremity Assessment: Overall WFL for tasks assessed (Baseline limitations from past surgeries to elbow/wrists/shoulders, but functional)   Lower Extremity Assessment LLE Deficits / Details: L knee with limited flexion, active knee flexion to ~75 degrees based on observation   Cervical / Trunk Assessment Cervical / Trunk Assessment: Normal   Communication Communication Communication: No difficulties   Cognition Arousal/Alertness: Awake/alert Behavior During Therapy: WFL for tasks assessed/performed Overall Cognitive Status: Within Functional Limits for tasks assessed                                 General Comments: Occasional cues for slow, controlled movements. Pt admitted excitement on going home.     General Comments       Exercises     Shoulder Instructions      Home Living Family/patient expects to be discharged to:: Private residence Living Arrangements: Spouse/significant other;Children Available Help at Discharge: Family;Available PRN/intermittently Type of Home: House Home Access: Stairs to enter [employer] of Steps: 1/2 step Entrance Stairs-Rails: None Home Layout: One level      Bathroom Shower/Tub: Tub/shower unit;Curtain   Biochemist, clinical: Standard (BSC atop toilet in one bathroom and riser with wide arm rails atop other toilet)     Home Equipment: BSC/3in1;Wheelchair - manual;Cane - single point;Grab bars - toilet;Hand held shower head;Toilet riser;Shower seat   Additional Comments: knee scooter. Spouse ordered LE elevation pillow for bed.      Prior Functioning/Environment Prior Level of Function : Independent/Modified Independent;Driving             Mobility Comments: enjoys gardening          OT Problem List: Impaired balance (sitting and/or standing);Decreased range of motion      OT Treatment/Interventions:      OT Goals(Current goals can be found in the care plan section) Acute Rehab OT Goals Patient Stated Goal: Go home today! OT Goal Formulation: All assessment and education complete, DC therapy Potential to Achieve Goals: Good ADL Goals Additional ADL Goal #1: Pt will verbalize or demonstrate understanding regarding dressing, bathing, toileting and grooming as well as safety with functional bathroom and kitchen mobility while maintaining NWB to RLE.  OT Frequency:      Co-evaluation              AM-PAC OT "6 Clicks" Daily Activity  Outcome Measure Help from another person eating meals?: None Help from another person taking care of personal grooming?: None Help from another person toileting, which includes using toliet, bedpan, or urinal?: A Little Help from another person bathing (including washing, rinsing, drying)?: A Little Help from another person to put on and taking off regular upper body clothing?: None Help from another person to put on and taking off regular lower body clothing?: A Little 6 Click Score: 21   End of Session Equipment Utilized During Treatment: Rolling walker (2 wheels)  Activity Tolerance: Patient tolerated treatment well Patient left: in chair;with call bell/phone within reach;with  family/visitor present  OT Visit Diagnosis: Unsteadiness on feet (R26.81)                Time: 2409-7353 OT Time Calculation (min): 30 min Charges:  OT General Charges $OT Visit: 1 Visit OT Evaluation $OT Eval Low Complexity: 1 Low OT Treatments $Self Care/Home Management : 8-22 mins  Anderson Malta, [place] Office: [phone] 03/27/2022  Julien Girt 03/27/2022, 9:46 AM

## 2022-03-27 NOTE — Discharge Instructions (Signed)

## 2022-03-27 NOTE — Progress Notes (Signed)
Physical Therapy Treatment Patient Details Name: Connie Hoffman MRN: [id number] DOB: 02-16-1961 Today's Date: 03/27/2022   History of Present Illness 61 y.o. female presents to [facility] hospital on 03/26/2022 for R achilles tendon reconstruction and debridement. PMH includes anemia, anxiety, DDD, HA, HTN, PE.    PT Comments    Pt received in recliner, agreeable to therapy session and with good participation and tolerance for transfer and curb step (threshold step) gait training with RW. Pt able to perform backward step-up onto 3" platform with minA +2 for safety and assist with RW management over step, pt able to maintain RLE NWB throughout. Recommend pt have +2 people present to assist with this getting out of car and up to home upon return today for her safety, pt/spouse receptive. Pt defers to practice curb step with knee scooter and states she plans to use RW for this. Case manager notified that RW has not yet been delivered to her room. Pt continues to benefit from PT services to progress toward functional mobility goals.   Recommendations for follow up therapy are one component of a multi-disciplinary discharge planning process, led by the attending physician.  Recommendations may be updated based on patient status, additional functional criteria and insurance authorization.  Follow Up Recommendations  Home health PT     Assistance Recommended at Discharge Intermittent Supervision/Assistance  Patient can return home with the following A little help with walking and/or transfers;A little help with bathing/dressing/bathroom;Assistance with cooking/housework;Assist for transportation;Help with stairs or ramp for entrance   Equipment Recommendations  Rolling walker (2 wheels)    Recommendations for Other Services       Precautions / Restrictions Precautions Precautions: Fall Required Braces or Orthoses: Other Brace Restrictions Weight Bearing Restrictions: Yes RLE Weight Bearing: Non  weight bearing     Mobility  Bed Mobility               General bed mobility comments: received/remained in recliner    Transfers Overall transfer level: Needs assistance Equipment used: Rolling walker (2 wheels) Transfers: Sit to/from Stand, Bed to chair/wheelchair/BSC Sit to Stand: Min assist, +2 safety/equipment           General transfer comment: minA from chair surface, spouse present and instructed on gait belt and guarding positions, PTA guarding also for pt safety    Ambulation/Gait Ambulation/Gait assistance: Min assist, +2 safety/equipment Gait Distance (Feet): 15 Feet Assistive device: Rolling walker (2 wheels) Gait Pattern/deviations: Step-to pattern ("hop-to") Gait velocity: reduced     General Gait Details: Pt mobilizes fairly well with RW, min guard for safety and x1 episode minA while pt turning prior to attempting curb step. Cues needed for IV line awareness.   Stairs Stairs: Yes Stairs assistance: Min assist, +2 physical assistance, +2 safety/equipment Stair Management: With walker, Backwards, Step to pattern Number of Stairs: 1 General stair comments: pt ascended/descended single 3" curb step via backward hop-up after initial demo, spouse present and instructed on guarding position with gait belt. Pt with mild instability, esp when raising RW up to surface needing +2 minA for stability and to maintain RLE NWB. Pt states this is the method she prefers for home entry and defers to practice curb step with knee scooter.   [employer]chant [employer] officer mobility:  (pt reports comfort with this technique)  Modified Rankin (Stroke Patients Only)       Balance Overall balance assessment: Needs assistance Sitting-balance support: No upper extremity supported, Feet supported Sitting balance-Leahy Scale: Good  Standing balance support: Single extremity supported, Reliant on assistive device for balance Standing balance-Leahy  Scale: Poor Standing balance comment: RW and external assist needed for safety                            Cognition Arousal/Alertness: Awake/alert Behavior During Therapy: WFL for tasks assessed/performed Overall Cognitive Status: Within Functional Limits for tasks assessed                                 General Comments: Spouse present and receptive to instruction.        Exercises Other Exercises Other Exercises: verbal/visual demo for toe ROMAT, pt demos back hip abduction, knee press, SLR, vcs for heel slides. Pt receptive, reviewed frequency    General Comments General comments (skin integrity, edema, etc.): VSS on RA per chart review, no acute s/sx distress      Pertinent Vitals/Pain Pain Assessment Pain Assessment: No/denies pain Faces Pain Scale: No hurt    Home Living Family/patient expects to be discharged to:: Private residence Living Arrangements: Spouse/significant other;Children Available Help at Discharge: Family;Available PRN/intermittently Type of Home: House Home Access: Stairs to enter Entrance Stairs-Rails: None Entrance Stairs-Number of Steps: 1/2 step   Home Layout: One level Home Equipment: BSC/3in1;Wheelchair - manual;Cane - single point;Grab bars - toilet;Hand held shower head;Toilet riser;Shower seat Additional Comments: knee scooter. Spouse ordered LE elevation pillow for bed.        PT Goals (current goals can now be found in the care plan section) Acute Rehab PT Goals Patient Stated Goal: To return to independence PT Goal Formulation: With patient/family Time For Goal Achievement: 04/09/22 Progress towards PT goals: Progressing toward goals    Frequency    Min 5X/week      PT Plan Current plan remains appropriate       AM-PAC PT "6 Clicks" Mobility   Outcome Measure  Help needed turning from your back to your side while in a flat bed without using bedrails?: None Help needed moving from lying on  your back to sitting on the side of a flat bed without using bedrails?: None Help needed moving to and from a bed to a chair (including a wheelchair)?: A Little Help needed standing up from a chair using your arms (e.g., wheelchair or bedside chair)?: A Little Help needed to walk in hospital room?: A Lot (+2 safety with RW to hop ~76ft) Help needed climbing 3-5 steps with a railing? : Total (can do curb step but anticipate pt would need +2 for standard step vs ramp) 6 Click Score: 17    End of Session Equipment Utilized During Treatment: Gait belt Activity Tolerance: Patient tolerated treatment well Patient left: in chair;with call bell/phone within reach;with family/visitor present Nurse Communication: Mobility status;Other (comment) (pt needs RW) PT Visit Diagnosis: Other abnormalities of gait and mobility (R26.89)     Time: 9323-5573 PT Time Calculation (min) (ACUTE ONLY): 20 min  Charges:  $Gait Training: 8-22 mins                     Demya Scruggs P., PTA Acute Rehabilitation Services Secure Chat Preferred 9a-5:30pm Office: [phone]    Dorathy Kinsman Anapaula Severt 03/27/2022, 11:00 AM

## 2022-03-27 NOTE — Plan of Care (Signed)

## 2022-03-30 ENCOUNTER — Other Ambulatory Visit (HOSPITAL_COMMUNITY): Payer: Self-pay

## 2022-03-30 NOTE — Op Note (Signed)
Connie Hoffman female 61 y.o. 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  ANESTHESIA: General with peripheral nerve blockade  FINDINGS: Chronic distal Achilles tendinosis/tear with large calcaneal exostosis  IMPLANTS: Arthrex speed bridge Biotenodesis screw  INDICATIONS:61 y.o. femalehad significant pain along the posterior aspect of her calcaneus and the insertion of her Achilles.  She has had pain for several months and MRI scan ordered by outside provider demonstrate likely partial insertional rupture.  She had thickening and tenderness along the tendon distally.  There is concern for inadequate tendon for reconstruction so discussion was had for possibility of deep tendon transfer of the flexor hallucis longus tendon.  We also discussed gastrocnemius recession as she had significant amount of tightness through her ankle joint.  We discussed the risks, benefits and alternatives of surgery which include but were not limited to wound healing complications, infection, need for further surgery, persistent discomfort and tightness.  Despite these risks she wished to proceed with surgery.  PROCEDURE: The patient was identified in the preoperative holding area.  The right leg was marked by myself.  The consent was signed by myself and the patient.  Peripheral nerve blockade was performed by anesthesia.  They was taken to the operative suite where general anesthesia was induced without difficulty.   Preoperative antibiotics were given.  The patient was then placed prone on the operative table.  Chest rolls were used and all bony prominences were well-padded.  A thigh tourniquet was placed on the operative thigh.  The operative extremity was then prepped and draped in the usual sterile fashion.  Surgical timeout was performed.  The tourniquet was elevated to 250 mmHg.  We began by making a longitudinal incision overlying the musculotendinous junction of the gastrocnemius muscle and tendon.  This was taken sharply down to skin and subcutaneous tissue.  Blunt dissection was used to mobilize skin flaps.  The deep fascia was then identified and incised in line with the incision.  Then deep to this were able to gain access to the gastrocnemius tendon.  The interval between the gastrocnemius and the soleus was identified.  Care was taken to protect the soft tissue posteriorly and anteriorly to the gastrocnemius tendon.  Then the tendon was incised transversely just above the insertion onto the soleus tendon.  The underlying tissue was inspected and found to be intact.  There was significant gain in ankle dorsiflexion after release of the tendon.  Then the wound was irrigated with normal saline.  The fascia was closed with a 3-0 Monocryl suture.  The skin and subcu tissues was closed with a 3-0 Monocryl and 3-0 nylon suture.  We then turned our attention to the Achilles tendon reconstruction  Achilles tendon debridement and tenolysis: We proceeded by making a longitudinal incision on the medial aspect of the distal Achilles tendon that curved into the central portion of the calcaneus tuberosity.  This was taken sharply down through skin, subcutaneous tissue and peritenon.  The tendon was identified and medial and lateral sub-peritenon flaps were created.    The tendon was fully exposed and removed from the calcaneal tuberosity.  The tendon was diseased appearing with tendinosis portions as well  as calcified  portions distally. There is evidence of prior tendon rupture with retraction of the tendon.  The distal aspect of the tendon was significantly scarred and diseased.  The diseased tendon was sharply debrided and excised with a 15 blade and scissors. Then using a Freer and a Cobb elevator the proximal portions of adhesed tendon were lysed circumferentially   There was a large amount of inflamed synovial tissue in the retro-calcaneal area.  The inflamed bursal and synovial tissue was excisionally debrided with 15 blade and excised.    Calcaneal exostectomy: Then the calcaneal tuberosity was inspected and using an osteotome the Haglund's bump and enthesophytes were removed.  Rondure was used to ensure adequate removal of all bony prominences medially, laterally and posteriorly.  Acceptable bony debridement was performed and the bone was excised.    Flexor hallucis longus deep tendon transfer: Then the Achilles was further inspected.  There was a significant amount of loss of tendon due to debridement of the scar tissue and therefore the decision to proceed with flexor hallucis longus tendon transfer was made.  The fascia overlying the deep posterior compartment was identified.  The fascia was incised in line with the incision.  The deep tissue was mobilized and the flexor halluces longus muscle was identified.  Were able to dissected out the flexor hallucis longus tendon and this tendon was transected as distal as possible.  Then using a fiber loop the tendon was sutured.  Then the guidepin for the Bio-Tenodesis screw was placed within the calcaneus.  After appropriate placement of the guidepin the reamer was used to ream for the tendon transfer.  Then the tendon was placed into the tunnel and the Bio-Tenodesis screw was placed with the tendon and maximal tension.  The ankle was and plantar flexion.  We then proceeded with Achilles tendon reconstruction.  Achilles tendon reconstruction: Then using the Arthrex  drill the 4 drill tunnels were placed within the calcaneus and the holes were tapped.  Then the suture tape was run through the Achilles tendon within healthy-appearing tendon at the point of maximal tension with repair onto the calcaneus.  Then this was tied down to the calcaneus.  Then the suture bridge technique was used to fix this distally.  There was excellent fixation and tension on the Achilles after repair.  Then the medial, lateral and posterior aspect of the repair was inspected and palpated for any bony prominences and there were none found.  The wound was then irrigated with normal saline and the tourniquet was released.  Hemostasis was obtained using Bovie cautery and direct compression.  Then the peritenon tissue was closed with a 2-0 PDS suture.  The subcuticular tissue was closed with 3-0 Monocryl and the skin with 3-0 nylon.   Xeroform, 4 x 4's and sterile she cotton was placed.  She was placed in a short leg nonweightbearing cast in a plantarflexed position.  There were no complications.  She tolerated the procedure well.  She was placed back in the supine position on her hospital bed and extubated.  She was taken to the recovery room in stable condition.  POST OPERATIVE INSTRUCTIONS: Nonweightbearing to operative extremity Keep cast dry and in place Call the office with any concerns Follow-up in 2 weeks for cast removal, suture removal if appropriate, no x-rays needed.  TOURNIQUET TIME:less than 2 hours  BLOOD LOSS:  Minimal         DRAINS: none         SPECIMEN:  none       COMPLICATIONS:  * No complications entered in OR log *         Disposition: PACU - hemodynamically stable.         Condition: stable

## 2022-04-03 ENCOUNTER — Telehealth: Payer: Self-pay | Admitting: Pharmacist

## 2022-04-03 NOTE — Telephone Encounter (Signed)
Submitted a Prior Authorization RENEWAL request to [facility] for OTEZLA via CoverMyMeds. Will update once we receive a response.  Key: [id number]  Knox Saliva, PharmD, MPH, BCPS, CPP Clinical Pharmacist (Rheumatology and Pulmonology)

## 2022-04-06 NOTE — Telephone Encounter (Signed)
Received notification from [facility] regarding a prior authorization for Connie Hoffman. Authorization has been APPROVED from 04/03/22 to 04/27/2023. Approval letter sent to scan center.  Patient can continue to fill through [place]: [phone]   Authorization # [id number]  Knox Saliva, PharmD, MPH, BCPS, CPP Clinical Pharmacist (Rheumatology and Pulmonology)

## 2022-04-16 ENCOUNTER — Other Ambulatory Visit (HOSPITAL_COMMUNITY): Payer: Self-pay

## 2022-04-21 ENCOUNTER — Other Ambulatory Visit: Payer: Self-pay

## 2022-04-22 ENCOUNTER — Other Ambulatory Visit: Payer: Self-pay

## 2022-04-22 ENCOUNTER — Other Ambulatory Visit (HOSPITAL_COMMUNITY): Payer: Self-pay

## 2022-04-30 ENCOUNTER — Encounter (HOSPITAL_COMMUNITY): Payer: Self-pay

## 2022-05-05 NOTE — Progress Notes (Deleted)
Office Visit Note  Patient: Connie Hoffman             Date of Birth: 06-30-1960           MRN: [id number]             PCP: Jenel Lucks, PA-C Referring: Burna Cash* Visit Date: 05/19/2022 Occupation: @GUAROCC$ @  Subjective:    History of Present Illness: Connie Hoffman is a 62 y.o. female with history of psoriatic arthritis and osteoarthritis.  She is taking Otezla 30 mg 1 tablet by mouth twice daily.   CBC and BMP drawn on 03/26/22.   Activities of Daily Living:  Patient reports morning stiffness for *** {minute/hour:19697}.   Patient {ACTIONS;DENIES/REPORTS:21021675::"Denies"} nocturnal pain.  Difficulty dressing/grooming: {ACTIONS;DENIES/REPORTS:21021675::"Denies"} Difficulty climbing stairs: {ACTIONS;DENIES/REPORTS:21021675::"Denies"} Difficulty getting out of chair: {ACTIONS;DENIES/REPORTS:21021675::"Denies"} Difficulty using hands for taps, buttons, cutlery, and/or writing: {ACTIONS;DENIES/REPORTS:21021675::"Denies"}  No Rheumatology ROS completed.   PMFS History:  Patient Active Problem List   Diagnosis Date Noted   Achilles tendonosis of right lower extremity 03/26/2022   Seizure ([place]) 11/03/2021   Diverticulitis of large intestine with abscess without bleeding 05/28/2021   History of pulmonary embolism 05/28/2021   Chronic anticoagulation 05/28/2021   Midline cystocele 05/23/2020   Pelvic mass 12/04/2019   DDD (degenerative disc disease), lumbar 01/21/2018   Primary osteoarthritis of both hands 01/21/2018   Primary osteoarthritis of both feet 01/21/2018   Psoriasis 12/01/2017   Psoriatic arthritis ([place]) 12/01/2017   High risk medication use 12/01/2017   S/P total knee replacement, 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   Thrombocytosis 12/01/2017   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    Past Medical History:  Diagnosis Date   Anemia    Anxiety    Chest pain syndrome    Chronic stable angina    DDD (degenerative disc disease), lumbar     Gastritis    Headache    Hematuria    resolved after removing ASA, only on Eliquis now   Hypertension    Intestinal abscess    Pre-diabetes    Precordial pain    Psoriatic arthritis ([place])    Pulmonary embolism ([facility])    Bilateral lungs   Refusal of blood transfusions as patient is Jehovah's Witness    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)    Sacroiliac pain    Spinal stenosis    Thyroid nodule     Family History  Problem Relation Age of Onset   Diabetes Mother    Hypertension Mother    Heart Problems Mother    Congestive Heart Failure Mother    Diabetes Father    Hypertension Father    Psoriasis Father    Stroke Sister    Heart Problems Sister    Seizures Sister    Prostate cancer Brother    Diabetes Brother    Asthma Daughter    Healthy Daughter    Healthy Son    Healthy Son    Past Surgical History:  Procedure Laterality Date   ARTHRODESIS FOOT WITH WEIL OSTEOTOMY Right 03/26/2022   Procedure: GASTROCNEMIUS RECESSION, CALCANEAL EXOSTECTOMY;  Surgeon: Erle Crocker, MD;  Location: [facility];  Service: Orthopedics;  Laterality: Right;   BACK SURGERY  05/18/2019   L4 and L5   CHOLECYSTECTOMY     CYSTECTOMY  07/2017   JOINT REPLACEMENT Left    left knee   LAPAROSCOPIC ENDOMETRIOSIS FULGURATION  2011   LAPAROSCOPIC GASTRIC SLEEVE RESECTION  2018   LAPAROSCOPIC OOPHERECTOMY Right    LUMBAR EPIDURAL INJECTION  04/13/2018  REVISION TOTAL KNEE ARTHROPLASTY Left 12/2017   TENOLYSIS Right 03/26/2022   Procedure: RIGHT ACHILLES TENDON RECONSTRUCTION AND DEBRIDEMENT;  Surgeon: Erle Crocker, MD;  Location: [facility];  Service: Orthopedics;  Laterality: Right;  LENGTH OF SURGERY: 120 MINUTES   TOTAL SHOULDER ARTHROPLASTY Bilateral    TRIGGER FINGER RELEASE Bilateral 2023   bilateral 3rd digit   TUBAL LIGATION     postpartum   ULNAR NERVE REPAIR Bilateral 2023   Social History   Social History Narrative   Right handed    Lives with husband and 1 daughters     Immunization History  Administered Date(s) Administered   Influenza,inj,Quad PF,6+ Mos 04/15/2018   PFIZER(Purple Top)SARS-COV-2 Vaccination 07/15/2019, 08/05/2019, 03/13/2020   Pneumococcal Polysaccharide-23 04/15/2018   Zoster Recombinat (Shingrix) 05/13/2018, 10/25/2019     Objective: Vital Signs: There were no vitals taken for this visit.   Physical Exam Vitals and nursing note reviewed.  Constitutional:      Appearance: She is well-developed.  HENT:     Head: Normocephalic and atraumatic.  Eyes:     Conjunctiva/sclera: Conjunctivae normal.  Cardiovascular:     Rate and Rhythm: Normal rate and regular rhythm.     Heart sounds: Normal heart sounds.  Pulmonary:     Effort: Pulmonary effort is normal.     Breath sounds: Normal breath sounds.  Abdominal:     General: Bowel sounds are normal.     Palpations: Abdomen is soft.  Musculoskeletal:     Cervical back: Normal range of motion.  Skin:    General: Skin is warm and dry.     Capillary Refill: Capillary refill takes less than 2 seconds.  Neurological:     Mental Status: She is alert and oriented to person, place, and time.  Psychiatric:        Behavior: Behavior normal.      Musculoskeletal Exam: ***  CDAI Exam: CDAI Score: -- Patient Global: --; Provider Global: -- Swollen: --; Tender: -- Joint Exam 05/19/2022   No joint exam has been documented for this visit   There is currently no information documented on the homunculus. Go to the Rheumatology activity and complete the homunculus joint exam.  Investigation: No additional findings.  Imaging: No results found.  Recent Labs: Lab Results  Component Value Date   WBC 6.2 03/26/2022   HGB 12.1 03/26/2022   PLT 337 03/26/2022   NA 140 03/26/2022   K 4.3 03/26/2022   CL 107 03/26/2022   CO2 25 03/26/2022   GLUCOSE 136 (H) 03/26/2022   BUN 8 03/26/2022   CREATININE 0.79 03/26/2022   BILITOT 0.5 11/03/2021   ALKPHOS 121 11/03/2021   AST 24  11/03/2021   ALT 37 11/03/2021   PROT 6.7 11/03/2021   ALBUMIN 3.2 (L) 11/03/2021   CALCIUM 8.9 03/26/2022   GFRAA 103 10/08/2020   QFTBGOLDPLUS NEGATIVE 05/01/2020    Speciality Comments: Cosentyx start date:04/2018  Procedures:  No procedures performed Allergies: Aspirin and Tylenol [acetaminophen]   Assessment / Plan:     Visit Diagnoses: No diagnosis found.  Orders: No orders of the defined types were placed in this encounter.  No orders of the defined types were placed in this encounter.   Face-to-face time spent with patient was *** minutes. Greater than 50% of time was spent in counseling and coordination of care.  Follow-Up Instructions: No follow-ups on file.   Earnestine Mealing, CMA  Note - This record has been created using Editor, commissioning.  Chart creation errors have been sought, but may not always  have been located. Such creation errors do not reflect on  the standard of medical care.

## 2022-05-08 HISTORY — PX: FOOT SURGERY: SHX648

## 2022-05-15 ENCOUNTER — Other Ambulatory Visit (HOSPITAL_COMMUNITY): Payer: Self-pay

## 2022-05-19 ENCOUNTER — Ambulatory Visit: Payer: 59 | Admitting: Physician Assistant

## 2022-05-19 DIAGNOSIS — R296 Repeated falls: Secondary | ICD-10-CM

## 2022-05-19 DIAGNOSIS — L409 Psoriasis, unspecified: Secondary | ICD-10-CM

## 2022-05-19 DIAGNOSIS — M5136 Other intervertebral disc degeneration, lumbar region: Secondary | ICD-10-CM

## 2022-05-19 DIAGNOSIS — Z9884 Bariatric surgery status: Secondary | ICD-10-CM

## 2022-05-19 DIAGNOSIS — Z79899 Other long term (current) drug therapy: Secondary | ICD-10-CM

## 2022-05-19 DIAGNOSIS — M1711 Unilateral primary osteoarthritis, right knee: Secondary | ICD-10-CM

## 2022-05-19 DIAGNOSIS — G5623 Lesion of ulnar nerve, bilateral upper limbs: Secondary | ICD-10-CM

## 2022-05-19 DIAGNOSIS — Z86711 Personal history of pulmonary embolism: Secondary | ICD-10-CM

## 2022-05-19 DIAGNOSIS — M19041 Primary osteoarthritis, right hand: Secondary | ICD-10-CM

## 2022-05-19 DIAGNOSIS — Z7901 Long term (current) use of anticoagulants: Secondary | ICD-10-CM

## 2022-05-19 DIAGNOSIS — Z96652 Presence of left artificial knee joint: Secondary | ICD-10-CM

## 2022-05-19 DIAGNOSIS — R252 Cramp and spasm: Secondary | ICD-10-CM

## 2022-05-19 DIAGNOSIS — K5720 Diverticulitis of large intestine with perforation and abscess without bleeding: Secondary | ICD-10-CM

## 2022-05-19 DIAGNOSIS — M19072 Primary osteoarthritis, left ankle and foot: Secondary | ICD-10-CM

## 2022-05-19 DIAGNOSIS — D75839 Thrombocytosis, unspecified: Secondary | ICD-10-CM

## 2022-05-19 DIAGNOSIS — L4050 Arthropathic psoriasis, unspecified: Secondary | ICD-10-CM

## 2022-05-22 ENCOUNTER — Other Ambulatory Visit (HOSPITAL_COMMUNITY): Payer: Self-pay

## 2022-06-03 ENCOUNTER — Telehealth: Payer: Self-pay

## 2022-06-03 ENCOUNTER — Ambulatory Visit (INDEPENDENT_AMBULATORY_CARE_PROVIDER_SITE_OTHER): Payer: 59 | Admitting: Plastic Surgery

## 2022-06-03 ENCOUNTER — Encounter: Payer: Self-pay | Admitting: Plastic Surgery

## 2022-06-03 VITALS — BP 131/54 | HR 64

## 2022-06-03 DIAGNOSIS — S91301A Unspecified open wound, right foot, initial encounter: Secondary | ICD-10-CM | POA: Diagnosis not present

## 2022-06-03 NOTE — Progress Notes (Signed)
Patient ID: Connie Hoffman, female    DOB: 1960-08-14, 62 y.o.   MRN: [id number]   Chief Complaint  Patient presents with   Consult        Skin Problem    The patient is a very sweet 62 year old female here for evaluation of her right foot.  She underwent surgery in November for an tear of her Achilles tendon.  When the cast came off she fell and ruptured it so had surgery again in January.  The incision appears to be healing well but right in the midline it has opened up a little bit.  It does not appear infected.  It is approximately 1 x 1 x 0.5 cm in size.  Her leg does not seem to have swelling.  Her past medical history is positive for anxiety, hypertension, degenerative disc disease, pre-diabetes and psoriatic arthritis.  She has had several surgeries in the past and those are listed below.  These include back surgery, knee surgery and shoulder surgery.  She is currently using Xeroform on the wound.     Review of Systems  Constitutional:  Positive for activity change. Negative for appetite change.  HENT: Negative.    Eyes: Negative.   Respiratory: Negative.  Negative for chest tightness and shortness of breath.   Cardiovascular: Negative.   Gastrointestinal: Negative.   Endocrine: Negative.   Genitourinary: Negative.   Skin:  Positive for wound.    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)    Sacroiliac pain    Spinal stenosis    Thyroid nodule     Past Surgical History:  Procedure Laterality Date   ARTHRODESIS FOOT  WITH WEIL OSTEOTOMY Right 03/26/2022   Procedure: GASTROCNEMIUS RECESSION, CALCANEAL EXOSTECTOMY;  Surgeon: Erle Crocker, MD;  Location: [facility];  Service: Orthopedics;  Laterality: Right;   BACK SURGERY  05/18/2019   L4 and L5   CHOLECYSTECTOMY     CYSTECTOMY  07/2017   JOINT REPLACEMENT Left    left knee   LAPAROSCOPIC ENDOMETRIOSIS FULGURATION  2011   LAPAROSCOPIC GASTRIC SLEEVE RESECTION  2018   LAPAROSCOPIC OOPHERECTOMY Right    LUMBAR EPIDURAL INJECTION  04/13/2018   REVISION TOTAL KNEE ARTHROPLASTY Left 12/2017   TENOLYSIS Right 03/26/2022   Procedure: RIGHT ACHILLES TENDON RECONSTRUCTION AND DEBRIDEMENT;  Surgeon: Erle Crocker, MD;  Location: [facility];  Service: Orthopedics;  Laterality: Right;  LENGTH OF SURGERY: 120 MINUTES   TOTAL SHOULDER ARTHROPLASTY Bilateral    TRIGGER FINGER RELEASE Bilateral 2023   bilateral 3rd digit   TUBAL LIGATION     postpartum   ULNAR NERVE REPAIR Bilateral 2023      Current Outpatient Medications:    albuterol (VENTOLIN HFA) 108 (90 Base) MCG/ACT inhaler, Inhale 2 puffs into the lungs every 6 (six) hours as needed for wheezing., Disp: , Rfl:    apixaban (ELIQUIS) 2.5 MG TABS tablet, Take  2.5 mg by mouth 2 (two) times daily., Disp: , Rfl:    Apremilast (OTEZLA) 30 MG TABS, Take 1 tablet (30 mg total) by mouth in the morning and at bedtime., Disp: 60 tablet, Rfl: 2   atorvastatin (LIPITOR) 80 MG tablet, Take 80 mg by mouth daily., Disp: , Rfl:    dexlansoprazole (DEXILANT) 60 MG capsule, Take 60 mg by mouth daily., Disp: , Rfl:    DULoxetine (CYMBALTA) 30 MG capsule, Take 30 mg by mouth at bedtime., Disp: , Rfl:    gabapentin (NEURONTIN) 300 MG capsule, Take 1 capsule (300 mg total) by mouth 3 (three) times daily., Disp: 270 capsule, Rfl: 3   isosorbide mononitrate (IMDUR) 30 MG 24 hr tablet, Take 60 mg by mouth daily., Disp: , Rfl:    Menthol, Topical Analgesic, (BIOFREEZE) 4 % GEL, Apply 1 application  topically daily as needed  (pain). Apply to painful sites daily as directed, Disp: , Rfl:    methocarbamol (ROBAXIN) 500 MG tablet, Take 500 mg by mouth 2 (two) times daily as needed for muscle spasms., Disp: , Rfl:    metoprolol succinate (TOPROL-XL) 50 MG 24 hr tablet, Take 50 mg by mouth daily., Disp: , Rfl:    nitroGLYCERIN (NITROSTAT) 0.4 MG SL tablet, Place 0.4 mg under the tongue every 5 (five) minutes as needed for chest pain., Disp: , Rfl:    NONFORMULARY OR COMPOUNDED ITEM, Apply 1 Application topically daily as needed (rash, itching). Triamcinolone compounded with unknown, Disp: , Rfl:    pantoprazole (PROTONIX) 40 MG tablet, Take 40 mg by mouth daily as needed (Heartburn)., Disp: , Rfl:    Polyethylene Glycol 400 (BLINK TEARS) 0.25 % SOLN, Place 1 drop into both eyes 2 (two) times daily., Disp: , Rfl:    sodium chloride (OCEAN) 0.65 % SOLN nasal spray, Place 1 spray into both nostrils 3 (three) times daily., Disp: , Rfl:    Objective:   Vitals:   06/03/22 1128  BP: (!) 131/54  Pulse: 64  SpO2: 98%    Physical Exam Constitutional:      Appearance: Normal appearance.  HENT:     Head: Normocephalic.  Cardiovascular:     Rate and Rhythm: Normal rate.     Pulses: Normal pulses.  Pulmonary:     Effort: Pulmonary effort is normal.  Musculoskeletal:        General: Tenderness and signs of injury present. No swelling.  Skin:    General: Skin is warm.     Capillary Refill: Capillary refill takes less than 2 seconds.     Coloration: Skin is not jaundiced.     Findings: Lesion present.  Neurological:     Mental Status: She is alert and oriented to person, place, and time.  Psychiatric:        Mood and Affect: Mood normal.        Behavior: Behavior normal.        Thought Content: Thought content normal.        Judgment: Judgment normal.     Assessment & Plan:  Open wound of right foot, initial encounter  Recommendations are for Vashe twice a day 10 minutes soaks.  Vaseline until supplies arrive.   Once the supplies arrive then Medihoney and Vashe daily.  I like to see the patient back in 1 week.  Will get a picture at the next visit.  I also need to ask her at the next visit if she is taking in a medication for her  psoriatic arthritis.  If she is we may need to hold it so that she can heal.  I also recommended decreasing her carbs from her sugars and increasing her protein.  I will let Dr. Lucia Gaskins know of the visit.  [place], DO

## 2022-06-03 NOTE — Telephone Encounter (Signed)
Faxed wound supply order to [facility] with demographics, insurance card and most recent ov note included. Received fax success confirmation. Forwarded original document to front desk for batch scanning.

## 2022-06-04 ENCOUNTER — Telehealth: Payer: Self-pay | Admitting: Plastic Surgery

## 2022-06-04 NOTE — Telephone Encounter (Signed)
Would like a call back to clarify cleaing and dressing with sutures.  Met with Dr. Marla Roe yesterday and couldn't remember exactly what she said.

## 2022-06-06 NOTE — Telephone Encounter (Signed)
Left message with patient to use Vashe on wound twice a day with 10 min soak on the gauze.

## 2022-06-08 ENCOUNTER — Ambulatory Visit: Payer: 59 | Admitting: Neurology

## 2022-06-08 ENCOUNTER — Telehealth: Payer: Self-pay | Admitting: Plastic Surgery

## 2022-06-08 NOTE — Telephone Encounter (Signed)
Pt called saying that she received the supplies for her wound but has not received meds that she thought Dr Marla Roe was going to prescribe her. Office visit was 2.7.24

## 2022-06-08 NOTE — Telephone Encounter (Signed)
Spoke with patient. She stated she thought the medihoney was a prescription. I informed her that the order would actually be coming through [facility]. She stated her shipment said 1 of 2 and her second shipment hadn't come yet. I asked that she see if that is the medihoney and to contact us if not. Pt agreed.

## 2022-06-12 ENCOUNTER — Ambulatory Visit (INDEPENDENT_AMBULATORY_CARE_PROVIDER_SITE_OTHER): Payer: 59 | Admitting: Surgical

## 2022-06-12 ENCOUNTER — Encounter: Payer: Self-pay | Admitting: Surgical

## 2022-06-12 VITALS — BP 153/82 | HR 87

## 2022-06-12 DIAGNOSIS — S91301A Unspecified open wound, right foot, initial encounter: Secondary | ICD-10-CM

## 2022-06-12 MED ORDER — CIPROFLOXACIN HCL 500 MG PO TABS: 500.0000 mg | ORAL_TABLET | Freq: Two times a day (BID) | ORAL | 0 refills | Status: AC

## 2022-06-12 NOTE — Progress Notes (Signed)
   Referring Provider Connie Lucks, PA-C [place],  [place] [address]   CC:  Chief Complaint  Patient presents with   Follow-up      Connie Hoffman is an 62 y.o. female.  HPI: Patient is a pleasant 61 year old female here with her husband for evaluation of her right ankle wound.  She underwent right Achilles tendon reconstruction and debridement on 03/26/2022 with Dr. Lucia Gaskins, when the cast came off she fell and ruptured it so she had surgery again in January.  The posterior ankle incision over the Achilles opened up a little bit and she has been receiving assistance from our office with managing the wound.  She reports that she has noticed some increased swelling in the right foot over the past week, reports she finished antibiotics, specifically ciprofloxacin on Monday.  She reports that she has also been ambulating per recommendation of Ortho.  She is otherwise feeling well.  She reported to nursing staff that she has been waiting for the [place] to come in the mail as she had to order it herself.  She reports that she has been using other wound ointment mixtures on the wound and feels as if this has been going well.  Review of Systems General: No fevers or chills  Physical Exam    06/12/2022    9:58 AM 06/03/2022   11:28 AM 03/27/2022    8:46 AM  Vitals with BMI  Systolic 0000000 [id number]  Diastolic 82 54 73  Pulse 87 64 76    General:  No acute distress,  Alert and oriented, Non-Toxic, Normal speech and affect Right ankle: Right ankle wound appears to be healing well, she does have a small opening with scant amount of yellowish drainage noted from the opening.  There is also some serous drainage noted.  The wound is approximately 1x1 cm.     There is some surrounding erythema and swelling noted, reportedly new per patient.  There is no swelling above the ankle, calf is soft, nontender.  Nylon suture is noted.  There is some pedal edema noted, palpable DP pulses  noted.       Assessment/Plan 62 year old female with right posterior ankle wound, she has some surrounding erythema and swelling noted.  Recommend Vaseline and gauze over the posterior ankle wound at this time for wound care.  We discussed prescription for ciprofloxacin will be sent to patient's pharmacy as this is what she was on previously, patient reports this was prescribed by orthopedics.  We will continue this for 1 week due to the increased redness and swelling in the area.    I was not able to locate any fluctuance or fluid collections on palpation, however given the hardware in place, would like to be proactive with antibiotic treatment.  I did notify patient's orthopedic surgeon, Dr. Lucia Gaskins.   I encouraged the patient to call our office or Dr. Pollie Friar office if her symptoms worsen including increased swelling, increased redness or systemic symptoms.  Her and her husband were understanding and in agreement with this plan.  Pictures were taken and placed in the chart with patient's permission.  I would like to see her back in 1 week for reevaluation.   [name] 06/12/2022, 10:21 AM

## 2022-06-18 ENCOUNTER — Other Ambulatory Visit (HOSPITAL_COMMUNITY): Payer: Self-pay

## 2022-06-18 ENCOUNTER — Encounter: Payer: Self-pay | Admitting: Student

## 2022-06-18 ENCOUNTER — Ambulatory Visit (INDEPENDENT_AMBULATORY_CARE_PROVIDER_SITE_OTHER): Payer: 59 | Admitting: Student

## 2022-06-18 ENCOUNTER — Other Ambulatory Visit (HOSPITAL_COMMUNITY)
Admission: RE | Admit: 2022-06-18 | Discharge: 2022-06-18 | Disposition: A | Discharge status: Home / Self Care | Payer: 59 | Attending: Surgical | Admitting: Surgical

## 2022-06-18 ENCOUNTER — Other Ambulatory Visit: Payer: Self-pay | Admitting: Physician Assistant

## 2022-06-18 VITALS — BP 148/78 | HR 94 | Temp 98.7°F

## 2022-06-18 DIAGNOSIS — S91301D Unspecified open wound, right foot, subsequent encounter: Secondary | ICD-10-CM

## 2022-06-18 DIAGNOSIS — L4050 Arthropathic psoriasis, unspecified: Secondary | ICD-10-CM

## 2022-06-18 DIAGNOSIS — L409 Psoriasis, unspecified: Secondary | ICD-10-CM

## 2022-06-18 MED ORDER — DOXYCYCLINE HYCLATE 100 MG PO TABS: 100.0000 mg | ORAL_TABLET | Freq: Two times a day (BID) | ORAL | 0 refills | Status: DC

## 2022-06-18 NOTE — Progress Notes (Signed)
Referring Provider Jenel Lucks, PA-C [place],  [place] [address]   CC:  Chief Complaint  Patient presents with   Follow-up      Connie Hoffman is an 62 y.o. female.  HPI: Patient is a 62 year old female with history of a right ankle wound.  She underwent right Achilles tendon reconstruction and debridement on 03/26/2022 with Dr. Lucia Gaskins.  When her cast came off, she fell and ruptured it so she had surgery again in January.  The posterior ankle incision over the Achilles opened up a little bit and she has been following up with our office for wound management.  Patient was seen last in the clinic on 06/12/2022.  At this visit, she reported that she noticed increased swelling in the right foot and finished a course of ciprofloxacin a few days prior to this appointment.  She otherwise reported she is feeling well.  She stated that she is waiting for the Medihoney to come in the mail.  On exam, right ankle wound appeared to be healing well but there did appear to be a small opening with a scant amount of yellowish drainage noted.  The wound was approximately 1 cm x 1 cm.  There was some surrounding swelling and erythema.  Plan was for patient to apply Vaseline and gauze over the posterior ankle wound.  Ciprofloxacin was also called in to the patient's pharmacy.  Today, patient reports she is doing well.  She states that she feels the redness has improved to her foot.  She states that she is still having some drainage, similar to last week.  She denies any fevers or chills.  She states that she has some new tenderness along the lateral aspect of her heel.  She denies any other issues or concerns at this time.  She reports that she has 1 day left of the ciprofloxacin.  Patient states that she was seen by Dr. Pollie Friar office yesterday and had the sutures removed.  Review of Systems General: Denies fevers or chills  Physical Exam    06/18/2022    2:58 PM 06/12/2022     9:58 AM 06/03/2022   11:28 AM  Vitals with BMI  Systolic [id number]  Diastolic 78 82 54  Pulse 94 87 64    Vitals were obtained at today's visit.  Patient is afebrile.  Vitals are stable. General:  No acute distress,  Alert and oriented, Non-Toxic, Normal speech and affect Right ankle.  There is a 1 cm x 1 cm opening with some yellowish/slightly cloudy drainage.  There is some possible fluctuance versus swollen tissue in this area palpated.  There is no frankly purulent drainage expressed on exam.  There is no malodor on exam.  There is no warmth noted on exam.  There is some surrounding erythema to the inferior aspect of the ankle.  There is some mild tenderness to palpation to the lateral aspect of the sole of the foot.  There are no overlying skin changes to this area.  The calf is soft and nontender.  It is nonswollen.  Palpable DP pulse noted.  5/5 plantarflexion and dorsiflexion.  Assessment/Plan  Open wound of right foot, subsequent encounter - Plan: Aerobic/Anaerobic Culture w Gram Stain (surgical/deep wound)   Given that patient is still experiencing some surrounding erythema and possible fluctuance, culture was taken at today's visit.  I discussed with the patient that I will start her on doxycycline.  Patient expressed understanding.  I discussed  the objective findings and plan with Dr. Marla Roe.  Plan will be for patient to start the doxycycline and to come back early next week for reevaluation.  At this visit, we will determine if the patient will need possible imaging versus further intervention.  I discussed this with the patient and she was in agreement with this plan.  Recommend the patient apply calcium alginate, gauze and wrap with Kerlix and Ace wrap daily.  A small amount of calcium alginate was used in clinic today and remainder was given to the patient.  Patient expressed understanding.  I recommended the patient make Dr. Lucia Gaskins aware of the new pain that she is having to  the sole of her heel.  Patient expressed understanding and states that she will give them a call.  She also states that she has an appointment with him next week.  I instructed the patient to monitor the area closely.  I discussed with her that if the area becomes more red, more painful, has increased drainage, if she develops any fevers or chills, or any worsening symptoms, she needs to let us know.  Patient expressed understanding.  Patient to follow-up next Tuesday.  Pictures were obtained of the patient and placed in the chart with the patient's or guardian's permission.   I instructed the patient to call if she has any questions or concerns.  [name] 06/18/2022, 4:16 PM

## 2022-06-19 ENCOUNTER — Telehealth: Payer: Self-pay | Admitting: Plastic Surgery

## 2022-06-19 NOTE — Telephone Encounter (Signed)
I spoke with Connie Hoffman, she notes continued pain and redness in her ankle/ heel.  Her call today was for concern of the infection as a relates to the hardware in her foot.  Given that we did not place the hardware I was not able to provide any clear recommendations as far as if the hardware would need to be removed.  She notes she did reach out to her orthopedic office and is awaiting their recommendations.  A culture was obtained yesterday in our office we are awaiting results from this, I recommend she continue taking the antibiotics.  I did let her know that if her redness, pain, and swelling significantly worsened over the weekend that seeking care in the emergency room would be appropriate as imaging at that point would be necessary.  I am hopeful that the antibiotics will provide coverage and improve her symptoms over the weekend, she will call me Monday morning or sooner as needed.  She is scheduled in our office on Tuesday.  She was happy with today's plan and had no further questions or concerns.

## 2022-06-19 NOTE — Telephone Encounter (Signed)
Patient called saying she now has pain in her foot wound and she also notes increased swelling and redness. Please call pt to advise [phone]

## 2022-06-21 ENCOUNTER — Other Ambulatory Visit: Payer: Self-pay | Admitting: Physician Assistant

## 2022-06-21 DIAGNOSIS — L409 Psoriasis, unspecified: Secondary | ICD-10-CM

## 2022-06-21 DIAGNOSIS — L4050 Arthropathic psoriasis, unspecified: Secondary | ICD-10-CM

## 2022-06-22 ENCOUNTER — Other Ambulatory Visit (HOSPITAL_COMMUNITY): Payer: Self-pay

## 2022-06-22 ENCOUNTER — Telehealth: Payer: Self-pay | Admitting: Student

## 2022-06-22 ENCOUNTER — Other Ambulatory Visit: Payer: Self-pay

## 2022-06-22 MED ORDER — OTEZLA 30 MG PO TABS
30.0000 mg | ORAL_TABLET | Freq: Two times a day (BID) | ORAL | 2 refills | Status: DC
  Filled 2022-06-22: qty 60, 30d supply, fill #0

## 2022-06-22 MED ORDER — SULFAMETHOXAZOLE-TRIMETHOPRIM 800-160 MG PO TABS: 1.0000 | ORAL_TABLET | Freq: Two times a day (BID) | ORAL | 0 refills | Status: AC

## 2022-06-22 NOTE — Telephone Encounter (Signed)
I spoke with the patient on the phone.  I discussed with her that her culture is growing out a bacteria which is sensitive to Bactrim.  I discussed with the patient that she should discontinue her doxycycline and she should start Bactrim.  She denies any sulfa allergies.  Patient states that she is overall doing okay.  She states that her right ankle wound is still draining a decent amount.  She reports that she feels like the swelling has gone down a little bit.  Patient states that she reached out to Dr. Pollie Friar office and that she has an MRI scheduled for tomorrow morning.  Patient also states that she has an appointment with their office on Thursday.  Patient states she still plans to come to our office for evaluation tomorrow.   I also discussed with the patient that she can apply ABD pads to the right ankle for drainage.  I discussed with her that I will order her more calcium alginate as well.  Patient to follow-up tomorrow.

## 2022-06-22 NOTE — Telephone Encounter (Signed)
Next Visit: 06/25/2022  Last Visit: 02/13/2022  Last Fill: 03/24/2022  DX: Psoriatic arthritis   Current Dose per office note 02/13/2022: Otezla 30 mg 1 tablet by mouth twice daily.   Labs: 05/24/2022 RBC 4.07, Hgb 11.7, Hct 35.3, Glucose 198, Alk. Phos 134  Okay to refill Otezla?

## 2022-06-22 NOTE — Telephone Encounter (Signed)
I spoke with Connie Hoffman today.  Her culture and sensitivity was reviewed with pharmacy, she is pansensitive and although the report does not show sensitivity to doxycycline this would be an appropriate antibiotic to continue.  Given the fact that she is currently on doxycycline we will continue at this time.  She agreed and will continue taking doxycycline.

## 2022-06-23 ENCOUNTER — Ambulatory Visit: Payer: 59 | Admitting: Physician Assistant

## 2022-06-23 ENCOUNTER — Telehealth: Payer: Self-pay | Admitting: *Deleted

## 2022-06-23 LAB — AEROBIC/ANAEROBIC CULTURE W GRAM STAIN (SURGICAL/DEEP WOUND)

## 2022-06-23 NOTE — Telephone Encounter (Signed)
Faxed order on (06/22/2022) to [facility] for supplies for the patient.    Supplies:Calcium Alginate Dressing                ABD Pads  Confirmation received and copy scanned into the chart.//AB/CMA

## 2022-06-23 NOTE — Progress Notes (Deleted)
Office Visit Note  Patient: Connie Hoffman             Date of Birth: [birth year]           MRN: [id number]             PCP: Jenel Lucks, PA-C Referring: Burna Cash* Visit Date: 06/25/2022 Occupation: '@GUAROCC'$ @  Subjective:  No chief complaint on file.   History of Present Illness: Connie Hoffman is a 62 y.o. female ***     Activities of Daily Living:  Patient reports morning stiffness for *** {minute/hour:19697}.   Patient {ACTIONS;DENIES/REPORTS:21021675::"Denies"} nocturnal pain.  Difficulty dressing/grooming: {ACTIONS;DENIES/REPORTS:21021675::"Denies"} Difficulty climbing stairs: {ACTIONS;DENIES/REPORTS:21021675::"Denies"} Difficulty getting out of chair: {ACTIONS;DENIES/REPORTS:21021675::"Denies"} Difficulty using hands for taps, buttons, cutlery, and/or writing: {ACTIONS;DENIES/REPORTS:21021675::"Denies"}  No Rheumatology ROS completed.   PMFS History:  Patient Active Problem List   Diagnosis Date Noted   Open wound of right foot 06/03/2022   Achilles tendonosis of right lower extremity 03/26/2022   Seizure ([place]) 11/03/2021   Diverticulitis of large intestine with abscess without bleeding 05/28/2021   History of pulmonary embolism 05/28/2021   Chronic anticoagulation 05/28/2021   Midline cystocele 05/23/2020   Pelvic mass 12/04/2019   DDD (degenerative disc disease), lumbar 01/21/2018   Primary osteoarthritis of both hands 01/21/2018   Primary osteoarthritis of both feet 01/21/2018   Psoriasis 12/01/2017   Psoriatic arthritis ([place]) 12/01/2017   High risk medication use 12/01/2017   S/P total knee replacement, 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   Thrombocytosis 12/01/2017   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    Past Medical History:  Diagnosis Date   Anemia    Anxiety    Chest pain syndrome    Chronic stable angina    DDD (degenerative disc disease), lumbar    Gastritis    Headache    Hematuria    resolved after removing ASA, only on  Eliquis now   Hypertension    Intestinal abscess    Pre-diabetes    Precordial pain    Psoriatic arthritis ([place])    Pulmonary embolism (H[facility])    Bilateral lungs   Refusal of blood transfusions as patient is Jehovah's Witness    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[facility])    Sacroiliac pain    Spinal stenosis    Thyroid nodule     Family History  Problem Relation Age of Onset   Diabetes Mother    Hypertension Mother    Heart Problems Mother    Congestive Heart Failure Mother    Diabetes Father    Hypertension Father    Psoriasis Father    Stroke Sister    Heart Problems Sister    Seizures Sister    Prostate cancer Brother    Diabetes Brother    Asthma Daughter    Healthy Daughter    Healthy Son    Healthy Son    Past Surgical History:  Procedure Laterality Date   ARTHRODESIS FOOT WITH WEIL OSTEOTOMY Right 03/26/2022   Procedure: GASTROCNEMIUS RECESSION, CALCANEAL EXOSTECTOMY;  Surgeon: Erle Crocker, MD;  Location: [facility];  Service: Orthopedics;  Laterality: Right;   BACK SURGERY  05/18/2019   L4 and L5   CHOLECYSTECTOMY     CYSTECTOMY  07/2017   JOINT REPLACEMENT Left    left knee   LAPAROSCOPIC ENDOMETRIOSIS FULGURATION  2011   LAPAROSCOPIC GASTRIC SLEEVE RESECTION  2018   LAPAROSCOPIC OOPHERECTOMY Right    LUMBAR EPIDURAL INJECTION  04/13/2018   REVISION TOTAL KNEE ARTHROPLASTY Left 12/2017   TENOLYSIS Right  03/26/2022   Procedure: RIGHT ACHILLES TENDON RECONSTRUCTION AND DEBRIDEMENT;  Surgeon: Erle Crocker, MD;  Location: [facility];  Service: Orthopedics;  Laterality: Right;  LENGTH OF SURGERY: 120 MINUTES   TOTAL SHOULDER ARTHROPLASTY Bilateral    TRIGGER FINGER RELEASE Bilateral 2023   bilateral 3rd digit   TUBAL LIGATION     postpartum   ULNAR NERVE REPAIR Bilateral 2023   Social History   Social History Narrative   Right handed    Lives with husband and 1 daughters    Immunization History  Administered Date(s) Administered   Influenza,inj,Quad PF,6+  Mos 04/15/2018   PFIZER(Purple Top)SARS-COV-2 Vaccination 07/15/2019, 08/05/2019, 03/13/2020   Pneumococcal Polysaccharide-23 04/15/2018   Zoster Recombinat (Shingrix) 05/13/2018, 10/25/2019     Objective: Vital Signs: There were no vitals taken for this visit.   Physical Exam   Musculoskeletal Exam: ***  CDAI Exam: CDAI Score: -- Patient Global: --; Provider Global: -- Swollen: --; Tender: -- Joint Exam 06/25/2022   No joint exam has been documented for this visit   There is currently no information documented on the homunculus. Go to the Rheumatology activity and complete the homunculus joint exam.  Investigation: No additional findings.  Imaging: No results found.  Recent Labs: Lab Results  Component Value Date   WBC 6.2 03/26/2022   HGB 12.1 03/26/2022   PLT 337 03/26/2022   NA 140 03/26/2022   K 4.3 03/26/2022   CL 107 03/26/2022   CO2 25 03/26/2022   GLUCOSE 136 (H) 03/26/2022   BUN 8 03/26/2022   CREATININE 0.79 03/26/2022   BILITOT 0.5 11/03/2021   ALKPHOS 121 11/03/2021   AST 24 11/03/2021   ALT 37 11/03/2021   PROT 6.7 11/03/2021   ALBUMIN 3.2 (L) 11/03/2021   CALCIUM 8.9 03/26/2022   GFRAA 103 10/08/2020   QFTBGOLDPLUS NEGATIVE 05/01/2020    Speciality Comments: Cosentyx start date:04/2018  Procedures:  No procedures performed Allergies: Aspirin and Tylenol [acetaminophen]   Assessment / Plan:     Visit Diagnoses: No diagnosis found.  Orders: No orders of the defined types were placed in this encounter.  No orders of the defined types were placed in this encounter.   Face-to-face time spent with patient was *** minutes. Greater than 50% of time was spent in counseling and coordination of care.  Follow-Up Instructions: No follow-ups on file.   Bo Merino, MD  Note - This record has been created using Editor, commissioning.  Chart creation errors have been sought, but may not always  have been located. Such creation errors do not  reflect on  the standard of medical care.

## 2022-06-23 NOTE — Telephone Encounter (Signed)
Received on (06/23/2022) via of fax Order Status Notification from [facility] stating:[facility] has provided service for the patient; no further action is required.  Copy scanned into the chart.//AB/CMA

## 2022-06-25 ENCOUNTER — Ambulatory Visit: Payer: 59 | Admitting: Rheumatology

## 2022-06-25 DIAGNOSIS — L409 Psoriasis, unspecified: Secondary | ICD-10-CM

## 2022-06-25 DIAGNOSIS — M1711 Unilateral primary osteoarthritis, right knee: Secondary | ICD-10-CM

## 2022-06-25 DIAGNOSIS — R252 Cramp and spasm: Secondary | ICD-10-CM

## 2022-06-25 DIAGNOSIS — Z9884 Bariatric surgery status: Secondary | ICD-10-CM

## 2022-06-25 DIAGNOSIS — M19042 Primary osteoarthritis, left hand: Secondary | ICD-10-CM

## 2022-06-25 DIAGNOSIS — M5136 Other intervertebral disc degeneration, lumbar region: Secondary | ICD-10-CM

## 2022-06-25 DIAGNOSIS — R296 Repeated falls: Secondary | ICD-10-CM

## 2022-06-25 DIAGNOSIS — G5623 Lesion of ulnar nerve, bilateral upper limbs: Secondary | ICD-10-CM

## 2022-06-25 DIAGNOSIS — Z86711 Personal history of pulmonary embolism: Secondary | ICD-10-CM

## 2022-06-25 DIAGNOSIS — Z96652 Presence of left artificial knee joint: Secondary | ICD-10-CM

## 2022-06-25 DIAGNOSIS — M19071 Primary osteoarthritis, right ankle and foot: Secondary | ICD-10-CM

## 2022-06-25 DIAGNOSIS — Z79899 Other long term (current) drug therapy: Secondary | ICD-10-CM

## 2022-06-25 DIAGNOSIS — L4050 Arthropathic psoriasis, unspecified: Secondary | ICD-10-CM

## 2022-06-25 DIAGNOSIS — K5720 Diverticulitis of large intestine with perforation and abscess without bleeding: Secondary | ICD-10-CM

## 2022-06-25 DIAGNOSIS — Z7901 Long term (current) use of anticoagulants: Secondary | ICD-10-CM

## 2022-06-25 DIAGNOSIS — Z6841 Body Mass Index (BMI) 40.0 and over, adult: Secondary | ICD-10-CM

## 2022-06-25 NOTE — Progress Notes (Deleted)
Office Visit Note  Patient: Connie Hoffman             Date of Birth: 12/30/1960           MRN: [id number]             PCP: Jenel Lucks, PA-C Referring: Burna Cash* Visit Date: 07/01/2022 Occupation: '@GUAROCC'$ @  Subjective:    History of Present Illness: Connie Hoffman is a 62 y.o. female with history of psoriatic arthritis and osteoarthritis.  She is taking otezla 30 mg 1 tablet by mouth twice daily.   07/02/22 scheduled for right ankle deep hardware removal    Activities of Daily Living:  Patient reports morning stiffness for *** {minute/hour:19697}.   Patient {ACTIONS;DENIES/REPORTS:21021675::"Denies"} nocturnal pain.  Difficulty dressing/grooming: {ACTIONS;DENIES/REPORTS:21021675::"Denies"} Difficulty climbing stairs: {ACTIONS;DENIES/REPORTS:21021675::"Denies"} Difficulty getting out of chair: {ACTIONS;DENIES/REPORTS:21021675::"Denies"} Difficulty using hands for taps, buttons, cutlery, and/or writing: {ACTIONS;DENIES/REPORTS:21021675::"Denies"}  No Rheumatology ROS completed.   PMFS History:  Patient Active Problem List   Diagnosis Date Noted   Open wound of right foot 06/03/2022   Achilles tendonosis of right lower extremity 03/26/2022   Seizure ([place]) 11/03/2021   Diverticulitis of large intestine with abscess without bleeding 05/28/2021   History of pulmonary embolism 05/28/2021   Chronic anticoagulation 05/28/2021   Midline cystocele 05/23/2020   Pelvic mass 12/04/2019   DDD (degenerative disc disease), lumbar 01/21/2018   Primary osteoarthritis of both hands 01/21/2018   Primary osteoarthritis of both feet 01/21/2018   Psoriasis 12/01/2017   Psoriatic arthritis ([place]) 12/01/2017   High risk medication use 12/01/2017   S/P total knee replacement, 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   Thrombocytosis 12/01/2017   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    Past Medical History:  Diagnosis Date   Anemia    Anxiety    Chest pain syndrome    Chronic  stable angina    DDD (degenerative disc disease), lumbar    Gastritis    Headache    Hematuria    resolved after removing ASA, only on Eliquis now   Hypertension    Intestinal abscess    Pre-diabetes    Precordial pain    Psoriatic arthritis ([place])    Pulmonary embolism ([facility])    Bilateral lungs   Refusal of blood transfusions as patient is Jehovah's Witness    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)    Sacroiliac pain    Spinal stenosis    Thyroid nodule     Family History  Problem Relation Age of Onset   Diabetes Mother    Hypertension Mother    Heart Problems Mother    Congestive Heart Failure Mother    Diabetes Father    Hypertension Father    Psoriasis Father    Stroke Sister    Heart Problems Sister    Seizures Sister    Prostate cancer Brother    Diabetes Brother    Asthma Daughter    Healthy Daughter    Healthy Son    Healthy Son    Past Surgical History:  Procedure Laterality Date   ARTHRODESIS FOOT WITH WEIL OSTEOTOMY Right 03/26/2022   Procedure: GASTROCNEMIUS RECESSION, CALCANEAL EXOSTECTOMY;  Surgeon: Erle Crocker, MD;  Location: [facility];  Service: Orthopedics;  Laterality: Right;   BACK SURGERY  05/18/2019   L4 and L5   CHOLECYSTECTOMY     CYSTECTOMY  07/2017   JOINT REPLACEMENT Left    left knee   LAPAROSCOPIC ENDOMETRIOSIS FULGURATION  2011   [place] RESECTION  2018  LAPAROSCOPIC OOPHERECTOMY Right    LUMBAR EPIDURAL INJECTION  04/13/2018   REVISION TOTAL KNEE ARTHROPLASTY Left 12/2017   TENOLYSIS Right 03/26/2022   Procedure: RIGHT ACHILLES TENDON RECONSTRUCTION AND DEBRIDEMENT;  Surgeon: Erle Crocker, MD;  Location: [facility];  Service: Orthopedics;  Laterality: Right;  LENGTH OF SURGERY: 120 MINUTES   TOTAL SHOULDER ARTHROPLASTY Bilateral    TRIGGER FINGER RELEASE Bilateral 2023   bilateral 3rd digit   TUBAL LIGATION     postpartum   ULNAR NERVE REPAIR Bilateral 2023   Social History   Social History Narrative    Right handed    Lives with husband and 1 daughters    Immunization History  Administered Date(s) Administered   Influenza,inj,Quad PF,6+ Mos 04/15/2018   PFIZER(Purple Top)SARS-COV-2 Vaccination 07/15/2019, 08/05/2019, 03/13/2020   Pneumococcal Polysaccharide-23 04/15/2018   Zoster Recombinat (Shingrix) 05/13/2018, 10/25/2019     Objective: Vital Signs: There were no vitals taken for this visit.   Physical Exam Vitals and nursing note reviewed.  Constitutional:      Appearance: She is well-developed.  HENT:     Head: Normocephalic and atraumatic.  Eyes:     Conjunctiva/sclera: Conjunctivae normal.  Cardiovascular:     Rate and Rhythm: Normal rate and regular rhythm.     Heart sounds: Normal heart sounds.  Pulmonary:     Effort: Pulmonary effort is normal.     Breath sounds: Normal breath sounds.  Abdominal:     General: Bowel sounds are normal.     Palpations: Abdomen is soft.  Musculoskeletal:     Cervical back: Normal range of motion.  Skin:    General: Skin is warm and dry.     Capillary Refill: Capillary refill takes less than 2 seconds.  Neurological:     Mental Status: She is alert and oriented to person, place, and time.  Psychiatric:        Behavior: Behavior normal.      Musculoskeletal Exam: ***  CDAI Exam: CDAI Score: -- Patient Global: --; Provider Global: -- Swollen: --; Tender: -- Joint Exam 07/01/2022   No joint exam has been documented for this visit   There is currently no information documented on the homunculus. Go to the Rheumatology activity and complete the homunculus joint exam.  Investigation: No additional findings.  Imaging: No results found.  Recent Labs: Lab Results  Component Value Date   WBC 6.2 03/26/2022   HGB 12.1 03/26/2022   PLT 337 03/26/2022   NA 140 03/26/2022   K 4.3 03/26/2022   CL 107 03/26/2022   CO2 25 03/26/2022   GLUCOSE 136 (H) 03/26/2022   BUN 8 03/26/2022   CREATININE 0.79 03/26/2022   BILITOT  0.5 11/03/2021   ALKPHOS 121 11/03/2021   AST 24 11/03/2021   ALT 37 11/03/2021   PROT 6.7 11/03/2021   ALBUMIN 3.2 (L) 11/03/2021   CALCIUM 8.9 03/26/2022   GFRAA 103 10/08/2020   QFTBGOLDPLUS NEGATIVE 05/01/2020    Speciality Comments: Cosentyx start date:04/2018  Procedures:  No procedures performed Allergies: Aspirin and Tylenol [acetaminophen]   Assessment / Plan:     Visit Diagnoses: Psoriatic arthritis ([place])  Psoriasis  High risk medication use  Primary osteoarthritis of both hands  Thrombocytosis  Ulnar neuropathy of both upper extremities  S/P total knee replacement, left  Primary osteoarthritis of right knee  Primary osteoarthritis of both feet  DDD (degenerative disc disease), lumbar  Chronic anticoagulation  History of gastric bypass  Diverticulitis of large intestine with  abscess without bleeding  History of pulmonary embolism  Nocturnal muscle cramps  Orders: No orders of the defined types were placed in this encounter.  No orders of the defined types were placed in this encounter.   Face-to-face time spent with patient was *** minutes. Greater than 50% of time was spent in counseling and coordination of care.  Follow-Up Instructions: No follow-ups on file.   Ofilia Neas, PA-C  Note - This record has been created using Dragon software.  Chart creation errors have been sought, but may not always  have been located. Such creation errors do not reflect on  the standard of medical care.

## 2022-06-26 ENCOUNTER — Ambulatory Visit (INDEPENDENT_AMBULATORY_CARE_PROVIDER_SITE_OTHER): Payer: 59 | Admitting: Physician Assistant

## 2022-06-26 DIAGNOSIS — S91301D Unspecified open wound, right foot, subsequent encounter: Secondary | ICD-10-CM

## 2022-06-26 NOTE — Progress Notes (Signed)
   Referring Provider Jenel Lucks, PA-C [place],  [place] [address]   CC:  Chief Complaint  Patient presents with   Follow-up      Connie Hoffman is an 62 y.o. female.   HPI: This is a 62 year old female with a past medical history of wound to her right ankle.  The patient is status post right Achilles tendon reconstruction and debridement on 03/26/2022 with Dr. Margie Ege.  After her cast was taken off she had a mechanical fall and ruptured her Achilles tendon so she went back to the OR in January 2024.  She developed a wound along the posterior ankle incision and was referred to our office for evaluation.  She was originally seen on 06/03/2022, she developed some signs of infection and was started on a course of Cipro prior to her appointment.  She was last seen in the office on 06/18/2022.  At that time she continued to have signs of infection.  She was placed on a course of doxycycline which she has been taking.  She notes she has followed up with Dr. Lucia Gaskins who recommended continued follow-up evaluation with our office.  She has had an MRI of her ankle but the images not available for review at this time.  At today's office visit she notes that she has swelling in her ankle that has not significantly worsened, she denies any fever.    Review of Systems General: Negative for fever  Physical Exam    06/18/2022    2:58 PM 06/12/2022    9:58 AM 06/03/2022   11:28 AM  Vitals with BMI  Systolic [id number]  Diastolic 78 82 54  Pulse 94 87 64    General:  No acute distress,  Alert and oriented, Non-Toxic, Normal speech and affect  Right ankle with swelling, no overlying redness, no significant warmth to touch.  She does have a wound along the Achilles incision with some yellowish discharge, no frank purulence, no surrounding redness.  Assessment/Plan  This is a 62 year old female seen in our office for evaluation of ankle wound.  Unfortunately the patient does  appear to have some swelling of the right ankle raising concern for infection.  She does not have any overlying redness or any worsening signs of infection on exam today.  There is no drainable fluid collection at this time.  Dr. Marla Roe had the opportunity to personally evaluate the patient.  She would recommend taking the patient to the OR to open the incision and washout the wound.  The skin around the ankle is very tight, we will be unable to close the wound at the time of surgery and will likely apply biologic product at that time.  The patient is happy with the plan, we will submit for surgery.  I would like to see the patient back in our office in 1 week for repeat evaluation or sooner as needed.  She verbalized understanding and agreement to today's plan.  Connie Hoffman Finnick Orosz 06/26/2022, 1:29 PM

## 2022-06-29 ENCOUNTER — Other Ambulatory Visit: Payer: Self-pay | Admitting: Orthopaedic Surgery

## 2022-06-29 NOTE — Progress Notes (Signed)
For Anesthesia: PCP - Jenel Lucks, PA-C  Cardiologist - Forde Dandy, MD    Chest x-ray - 05/24/22 [facility] [place] EKG - 05/31/22 [facility] fax request sent Stress Test -  ECHO -  Cardiac Cath -  Pacemaker/ICD device last checked: Pacemaker orders received: Device Rep notified:  Spinal Cord Stimulator:  Sleep Study -  CPAP -   Fasting Blood Sugar -  Checks Blood Sugar _____ times a day Date and result of last Hgb A1c-  Last dose of GLP1 agonist-  GLP1 instructions:   Last dose of SGLT-2 inhibitors-  SGLT-2 instructions:  Blood Thinner Instructions: Eliquis Aspirin Instructions: Last Dose:  Activity level: Can go up a flight of stairs and activities of daily living without stopping and without chest pain and/or shortness of breath   Able to exercise without chest pain and/or shortness of breath   Unable to go up a flight of stairs without chest pain and/or shortness of breath     Anesthesia review: chest pain syndrome, prediabetes, gastric bypass, hx of multiple subsegmental pulmonary emboli  with chronic anticoagulation on Eliquis, psoriasis   Patient denies shortness of breath, fever, cough and chest pain at PAT appointment   Patient verbalized understanding of instructions reviewed via telephone.

## 2022-06-30 ENCOUNTER — Encounter (HOSPITAL_COMMUNITY): Payer: Self-pay | Admitting: Orthopaedic Surgery

## 2022-06-30 ENCOUNTER — Other Ambulatory Visit: Payer: Self-pay

## 2022-06-30 NOTE — Progress Notes (Addendum)
COVID Vaccine Completed:  Yes  Date of COVID positive in last 90 days:  No  PCP - Cathi Roan, PA-C Cardiologist - Orlinda Blalock, MD Neurologist - Metta Clines Pulmonologist - Elijio Miles, PA-C  Chest x-ray - 05-24-22 [facility] EKG - 05-28-22 [facility] copy on chart.  11-03-21 Epic Stress Test - Yes ECHO - 02-15-21 [facility] Cardiac Cath - 03-21-21 [facility] Pacemaker/ICD device last checked: Spinal Cord Stimulator:  N/A  Bowel Prep -  N/A  Sleep Study - Yes, Neg sleep apnea CPAP - No  Prediabetes Fasting Blood Sugar - One time low of 55, highest 210.   Averages in the 100 range  Checks Blood Sugar - 2 times a day  Last dose of GLP1 agonist-  N/A GLP1 instructions:  N/A   Last dose of SGLT-2 inhibitors-  N/A SGLT-2 instructions: N/A  Blood Thinner Instructions: Eliquis for hx of PE. Last dose 06-29-22 Aspirin Instructions: Last Dose:  Activity level:  Can go up a flight of stairs and perform activities of daily living without stopping and without symptoms of chest pain or shortness of breath.  Does have limitations due to joint pain  Anesthesia review: Chest pain evaluated by cardiology, HTN, prediabetes.  States no recent episodes of chest pain  Seizure-like activity followed, last episode a year ago, managed with Gabapentin  Patient denies shortness of breath, fever, cough and chest pain at PAT appointment (completed over the phone)  Patient verbalized understanding of instructions that were given to them at the PAT appointment. Patient was also instructed that they will need to review over the PAT instructions again at home before surgery.

## 2022-07-01 ENCOUNTER — Ambulatory Visit: Payer: 59 | Admitting: Physician Assistant

## 2022-07-01 DIAGNOSIS — Z79899 Other long term (current) drug therapy: Secondary | ICD-10-CM

## 2022-07-01 DIAGNOSIS — L409 Psoriasis, unspecified: Secondary | ICD-10-CM

## 2022-07-01 DIAGNOSIS — M5136 Other intervertebral disc degeneration, lumbar region: Secondary | ICD-10-CM

## 2022-07-01 DIAGNOSIS — M19042 Primary osteoarthritis, left hand: Secondary | ICD-10-CM

## 2022-07-01 DIAGNOSIS — Z9884 Bariatric surgery status: Secondary | ICD-10-CM

## 2022-07-01 DIAGNOSIS — G5623 Lesion of ulnar nerve, bilateral upper limbs: Secondary | ICD-10-CM

## 2022-07-01 DIAGNOSIS — D75839 Thrombocytosis, unspecified: Secondary | ICD-10-CM

## 2022-07-01 DIAGNOSIS — M19071 Primary osteoarthritis, right ankle and foot: Secondary | ICD-10-CM

## 2022-07-01 DIAGNOSIS — Z7901 Long term (current) use of anticoagulants: Secondary | ICD-10-CM

## 2022-07-01 DIAGNOSIS — Z96652 Presence of left artificial knee joint: Secondary | ICD-10-CM

## 2022-07-01 DIAGNOSIS — R252 Cramp and spasm: Secondary | ICD-10-CM

## 2022-07-01 DIAGNOSIS — L4050 Arthropathic psoriasis, unspecified: Secondary | ICD-10-CM

## 2022-07-01 DIAGNOSIS — Z86711 Personal history of pulmonary embolism: Secondary | ICD-10-CM

## 2022-07-01 DIAGNOSIS — M1711 Unilateral primary osteoarthritis, right knee: Secondary | ICD-10-CM

## 2022-07-01 DIAGNOSIS — K5720 Diverticulitis of large intestine with perforation and abscess without bleeding: Secondary | ICD-10-CM

## 2022-07-02 DIAGNOSIS — D649 Anemia, unspecified: Secondary | ICD-10-CM

## 2022-07-02 DIAGNOSIS — I10 Essential (primary) hypertension: Secondary | ICD-10-CM

## 2022-07-02 DIAGNOSIS — Z01818 Encounter for other preprocedural examination: Secondary | ICD-10-CM

## 2022-07-02 DIAGNOSIS — R7303 Prediabetes: Secondary | ICD-10-CM

## 2022-07-03 ENCOUNTER — Ambulatory Visit: Payer: 59 | Admitting: Physician Assistant

## 2022-07-06 ENCOUNTER — Other Ambulatory Visit: Payer: Self-pay | Admitting: Neurology

## 2022-07-06 ENCOUNTER — Encounter (HOSPITAL_COMMUNITY): Payer: Self-pay | Admitting: Orthopaedic Surgery

## 2022-07-06 ENCOUNTER — Other Ambulatory Visit: Payer: Self-pay

## 2022-07-06 MED ORDER — LACTATED RINGERS IV SOLN: INTRAVENOUS | Status: DC

## 2022-07-06 MED ORDER — ORAL CARE MOUTH RINSE: 15.0000 mL | Freq: Once | OROMUCOSAL | Status: AC

## 2022-07-06 MED ORDER — CHLORHEXIDINE GLUCONATE 0.12 % MT SOLN
15.0000 mL | Freq: Once | OROMUCOSAL | Status: AC
  Administered 2022-07-07: 15 mL via OROMUCOSAL
  Filled 2022-07-06: qty 15

## 2022-07-06 MED ORDER — CEFAZOLIN IN SODIUM CHLORIDE 3-0.9 GM/100ML-% IV SOLN: 3.0000 g | INTRAVENOUS | Status: DC

## 2022-07-06 NOTE — Progress Notes (Addendum)
Mrs. Byus denies chest pain or shortness of breath. Patient denies having any s/s of Covid in her household, also denies any known exposure to Covid. Mrs Malley denies any lower respiratory infections in over 8 weeks, no upper s/s infection in over 4 weeks.  Mrs. Vecchiarelli PCP is Cristy Hilts, [place] with [facility], Cardiologist Dr Orlinda Blalock also at [facility], Neurologist is Dr. Griffith Citron.  Mrs. Mundine states that she does not have HTN, she takes Metoprolol and Isosorbide for chest pain.Mrs. Gehrke reports that she has not had chest in over 2 months. Patient states the chest pain is usually at the center of my chest, it is a tightening, denies shortness of breath, lightheadedness, diaphoresis. Mrs. Thalacker reports that she is usually standing, doing something, she seats down and it goes away, sometimes takes NTG, but sitting down usually takes care of it.  Mrs. Whitsett states she it has been over 6 months since she has had any seizure like activity.

## 2022-07-06 NOTE — Progress Notes (Signed)
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   Patient recently seen in the ED on 05/24/2022 with chest pain x 3 days associated with SOB. No improvement with NTG. Hx PE. BP 137/77, pulse 82, pulse ox 99%. She had chest wall tenderness. Troponin I 4, 4. D-dimer elevated 860. CTA chest: Negative for PE. No evidence of acute cardiopulmonary disease.  She was seen in follow-up by her cardiologist at [facility] on 05/28/2022.  Per note, "CP - microvascular disease or endothelial dysfunction versus esophageal spasm versus diastolic dysfunction vs musculoskeletal vs unknown etiology. Symptoms are variable. Frequently microvascular disease / endothelial dysfunction responds to nitrates, BBs, ACE inhibitors, ARBs and/or statins. She is already on atorvastatin 80, Imdur 60, metoprolol XL 50. We are going to try lisinopril 2.5 mg daily."  72-monthfollow-up recommended.  Patient will need day of surgery labs and evaluation.  EKG 05/24/2022 (Care Everywhere): Sinus rhythm.  Rate 76.  Cardiac cath 03/21/21 (Care Everywhere): Impression:  Normal coronary arteries  Moderately elevated LVEDP   Plan:  1. Secondary prevention of coronary artery disease  2. Medical management of elevated LVEDP      Echo 02/15/21 (Care Everywhere): SUMMARY  Normal biventricular size and function, LVEF 60-65%  No significant valvular heart disease, no pericardial effusion. Trace aortic regurgitation. Trace tricuspid regurgitation. Trace pulmonic regurgitation.   No prior study for comparison.     JWynonia MustyMSelect Speciality Hospital Of Fort MyersShort Stay Center/Anesthesiology Phone [phone]/02/2023 10:13 AM

## 2022-07-06 NOTE — Anesthesia Preprocedure Evaluation (Addendum)
Anesthesia Evaluation  Patient identified by MRN, date of birth, ID band Patient awake    Reviewed: Allergy & Precautions, NPO status , Patient's Chart, lab work & pertinent test results  History of Anesthesia Complications Negative for: history of anesthetic complications  Airway Mallampati: III  TM Distance: >3 FB Neck ROM: Full   Comment: Previous grade I view with MAC 3, easy mask Dental  (+) Dental Advisory Given   Pulmonary neg shortness of breath, neg sleep apnea, neg COPD, neg recent URI, PE   Pulmonary exam normal breath sounds clear to auscultation       Cardiovascular hypertension (ISMN, metoprolol), Pt. on medications and Pt. on home beta blockers + angina (has not used nitroglycerin in >8 months)  (-) Past MI, (-) Cardiac Stents and (-) CABG (-) dysrhythmias  Rhythm:Regular Rate:Normal  HLD   Neuro/Psych  Headaches, Seizures -,  PSYCHIATRIC DISORDERS Anxiety      Neuromuscular disease (spinal stenosis)    GI/Hepatic Neg liver ROS, hiatal hernia,GERD  Medicated,,H/o gastric bypass   Endo/Other    Morbid obesityThyroid nodule, pre-diabetes  Renal/GU Renal artery aneurysm     Musculoskeletal  (+) Arthritis  (psoriatic), Osteoarthritis,    Abdominal  (+) + obese  Peds  Hematology  (+) Blood dyscrasia, anemia , REFUSES BLOOD PRODUCTS, JEHOVAH'S WITNESS  Anesthesia Other Findings Last Eliquis: 07/04/2022  Reproductive/Obstetrics                             Anesthesia Physical Anesthesia Plan  ASA: 3  Anesthesia Plan: General   Post-op Pain Management: Tylenol PO (pre-op)*   Induction: Intravenous  PONV Risk Score and Plan: 3 and Ondansetron, Dexamethasone and Treatment may vary due to age or medical condition  Airway Management Planned: Oral ETT  Additional Equipment:   Intra-op Plan:   Post-operative Plan: Extubation in OR  Informed Consent: I have reviewed the patients  History and Physical, chart, labs and discussed the procedure including the risks, benefits and alternatives for the proposed anesthesia with the patient or authorized representative who has indicated his/her understanding and acceptance.     Dental advisory given  Plan Discussed with: CRNA and Anesthesiologist  Anesthesia Plan Comments: (Risks of general anesthesia discussed including, but not limited to, sore throat, hoarse voice, chipped/damaged teeth, injury to vocal cords, nausea and vomiting, allergic reactions, lung infection, heart attack, stroke, and death. All questions answered.   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   Patient recently seen in the ED on 05/24/2022 with chest pain x 3 days associated with SOB. No improvement with NTG. Hx PE. BP 137/77, pulse 82, pulse ox 99%. She had chest wall tenderness. Troponin I 4, 4. D-dimer elevated 860. CTA chest: Negative for PE. No evidence of acute cardiopulmonary disease.  She was seen in follow-up by her cardiologist at [facility] on 05/28/2022.  Per note, "CP - microvascular disease or endothelial dysfunction versus esophageal spasm versus diastolic dysfunction vs musculoskeletal vs unknown etiology. Symptoms are variable. Frequently microvascular disease / endothelial dysfunction responds to nitrates, BBs, ACE inhibitors, ARBs and/or statins. She is already on atorvastatin 80, Imdur 60, metoprolol XL 50. We are going  to try lisinopril 2.5 mg daily."  58-monthfollow-up  recommended.  Patient will need day of surgery labs and evaluation.  EKG 05/24/2022 (Care Everywhere): Sinus rhythm.  Rate 76.  Cardiac cath 03/21/21 (Care Everywhere): Impression:  Normal coronary arteries  Moderately elevated LVEDP   Plan:  1. Secondary prevention of coronary artery disease  2. Medical management of elevated LVEDP    Echo 02/15/21 (Care Everywhere): SUMMARY  Normal biventricular size and function, LVEF 60-65%  No significant valvular heart disease, no pericardial effusion. Trace aortic regurgitation. Trace tricuspid regurgitation. Trace pulmonic regurgitation. No prior study for comparison.  )        Anesthesia Quick Evaluation

## 2022-07-07 ENCOUNTER — Encounter (HOSPITAL_COMMUNITY): Payer: Self-pay | Admitting: Orthopaedic Surgery

## 2022-07-07 ENCOUNTER — Ambulatory Visit (HOSPITAL_COMMUNITY): Payer: 59

## 2022-07-07 ENCOUNTER — Ambulatory Visit (HOSPITAL_BASED_OUTPATIENT_CLINIC_OR_DEPARTMENT_OTHER): Payer: 59 | Admitting: Physician Assistant

## 2022-07-07 ENCOUNTER — Observation Stay (HOSPITAL_COMMUNITY)
Admission: RE | Admit: 2022-07-07 | Discharge: 2022-07-09 | Disposition: A | Discharge status: Home / Self Care | Payer: 59 | Source: Ambulatory Visit | Attending: Orthopaedic Surgery | Admitting: Orthopaedic Surgery

## 2022-07-07 ENCOUNTER — Other Ambulatory Visit: Payer: Self-pay

## 2022-07-07 ENCOUNTER — Ambulatory Visit (HOSPITAL_COMMUNITY): Payer: 59 | Admitting: Physician Assistant

## 2022-07-07 ENCOUNTER — Encounter (HOSPITAL_COMMUNITY)
Admission: RE | Disposition: A | Discharge status: Home / Self Care | Payer: Self-pay | Source: Ambulatory Visit | Attending: Orthopaedic Surgery

## 2022-07-07 DIAGNOSIS — Z7901 Long term (current) use of anticoagulants: Secondary | ICD-10-CM | POA: Diagnosis not present

## 2022-07-07 DIAGNOSIS — Z96612 Presence of left artificial shoulder joint: Secondary | ICD-10-CM | POA: Diagnosis not present

## 2022-07-07 DIAGNOSIS — Z8659 Personal history of other mental and behavioral disorders: Secondary | ICD-10-CM

## 2022-07-07 DIAGNOSIS — Z96611 Presence of right artificial shoulder joint: Secondary | ICD-10-CM | POA: Insufficient documentation

## 2022-07-07 DIAGNOSIS — I10 Essential (primary) hypertension: Secondary | ICD-10-CM

## 2022-07-07 DIAGNOSIS — R7303 Prediabetes: Secondary | ICD-10-CM

## 2022-07-07 DIAGNOSIS — Y828 Other medical devices associated with adverse incidents: Secondary | ICD-10-CM | POA: Insufficient documentation

## 2022-07-07 DIAGNOSIS — Z96652 Presence of left artificial knee joint: Secondary | ICD-10-CM | POA: Insufficient documentation

## 2022-07-07 DIAGNOSIS — E119 Type 2 diabetes mellitus without complications: Secondary | ICD-10-CM

## 2022-07-07 DIAGNOSIS — D649 Anemia, unspecified: Secondary | ICD-10-CM

## 2022-07-07 DIAGNOSIS — Z86711 Personal history of pulmonary embolism: Secondary | ICD-10-CM | POA: Diagnosis not present

## 2022-07-07 DIAGNOSIS — S86011A Strain of right Achilles tendon, initial encounter: Secondary | ICD-10-CM | POA: Diagnosis not present

## 2022-07-07 DIAGNOSIS — E1165 Type 2 diabetes mellitus with hyperglycemia: Secondary | ICD-10-CM | POA: Insufficient documentation

## 2022-07-07 DIAGNOSIS — Z79899 Other long term (current) drug therapy: Secondary | ICD-10-CM | POA: Insufficient documentation

## 2022-07-07 DIAGNOSIS — F419 Anxiety disorder, unspecified: Secondary | ICD-10-CM | POA: Insufficient documentation

## 2022-07-07 DIAGNOSIS — Z7722 Contact with and (suspected) exposure to environmental tobacco smoke (acute) (chronic): Secondary | ICD-10-CM | POA: Insufficient documentation

## 2022-07-07 DIAGNOSIS — Z6841 Body Mass Index (BMI) 40.0 and over, adult: Secondary | ICD-10-CM | POA: Diagnosis not present

## 2022-07-07 DIAGNOSIS — T8131XA Disruption of external operation (surgical) wound, not elsewhere classified, initial encounter: Secondary | ICD-10-CM

## 2022-07-07 DIAGNOSIS — M25571 Pain in right ankle and joints of right foot: Secondary | ICD-10-CM

## 2022-07-07 DIAGNOSIS — T84293A Other mechanical complication of internal fixation device of bones of foot and toes, initial encounter: Secondary | ICD-10-CM | POA: Insufficient documentation

## 2022-07-07 DIAGNOSIS — Z01818 Encounter for other preprocedural examination: Secondary | ICD-10-CM

## 2022-07-07 DIAGNOSIS — R739 Hyperglycemia, unspecified: Secondary | ICD-10-CM | POA: Insufficient documentation

## 2022-07-07 DIAGNOSIS — D62 Acute posthemorrhagic anemia: Secondary | ICD-10-CM | POA: Insufficient documentation

## 2022-07-07 HISTORY — DX: Personal history of other diseases of the digestive system: Z87.19

## 2022-07-07 HISTORY — DX: Methicillin resistant Staphylococcus aureus infection, unspecified site: A49.02

## 2022-07-07 HISTORY — PX: MINOR HARDWARE REMOVAL: SHX6474

## 2022-07-07 HISTORY — DX: Unspecified convulsions: R56.9

## 2022-07-07 LAB — CBC
HCT: 38.7 % (ref 36.0–46.0)
Hemoglobin: 12.3 g/dL (ref 12.0–15.0)
MCH: 28.3 pg (ref 26.0–34.0)
MCHC: 31.8 g/dL (ref 30.0–36.0)
MCV: 89.2 fL (ref 80.0–100.0)
Platelets: 354 10*3/uL (ref 150–400)
RBC: 4.34 MIL/uL (ref 3.87–5.11)
RDW: 13 % (ref 11.5–15.5)
WBC: 5.6 10*3/uL (ref 4.0–10.5)
nRBC: 0 % (ref 0.0–0.2)

## 2022-07-07 LAB — BASIC METABOLIC PANEL
Anion gap: 14 (ref 5–15)
BUN: 11 mg/dL (ref 6–20)
CO2: 22 mmol/L (ref 22–32)
Calcium: 9.2 mg/dL (ref 8.9–10.3)
Chloride: 105 mmol/L (ref 98–111)
Creatinine, Ser: 0.73 mg/dL (ref 0.44–1.00)
GFR, Estimated: >60 mL/min (ref >60)
Glucose, Bld: 153 mg/dL — ABNORMAL HIGH (ref 70–99)
Potassium: 3.8 mmol/L (ref 3.5–5.1)
Sodium: 141 mmol/L (ref 135–145)

## 2022-07-07 LAB — SURGICAL PCR SCREEN
MRSA, PCR: NEGATIVE
Staphylococcus aureus: NEGATIVE

## 2022-07-07 LAB — NO BLOOD PRODUCTS

## 2022-07-07 SURGERY — MINOR HARDWARE REMOVAL
Anesthesia: General | Laterality: Right

## 2022-07-07 MED ORDER — OXYCODONE HCL 5 MG/5ML PO SOLN: 5.0000 mg | Freq: Once | ORAL | Status: AC | PRN

## 2022-07-07 MED ORDER — FENTANYL CITRATE (PF) 100 MCG/2ML IJ SOLN
INTRAMUSCULAR | Status: AC
  Filled 2022-07-07: qty 2

## 2022-07-07 MED ORDER — OXYCODONE HCL 5 MG PO TABS
ORAL_TABLET | ORAL | Status: AC
  Filled 2022-07-07: qty 1

## 2022-07-07 MED ORDER — METOCLOPRAMIDE HCL 5 MG/ML IJ SOLN: 5.0000 mg | Freq: Three times a day (TID) | INTRAMUSCULAR | Status: DC | PRN

## 2022-07-07 MED ORDER — ROCURONIUM BROMIDE 10 MG/ML (PF) SYRINGE
PREFILLED_SYRINGE | INTRAVENOUS | Status: AC
  Filled 2022-07-07: qty 10

## 2022-07-07 MED ORDER — VANCOMYCIN HCL 500 MG IV SOLR
INTRAVENOUS | Status: AC
  Filled 2022-07-07: qty 10

## 2022-07-07 MED ORDER — ONDANSETRON HCL 4 MG/2ML IJ SOLN
INTRAMUSCULAR | Status: DC | PRN
  Administered 2022-07-07: 4 mg via INTRAVENOUS

## 2022-07-07 MED ORDER — METHOCARBAMOL 1000 MG/10ML IJ SOLN: 500.0000 mg | Freq: Four times a day (QID) | INTRAVENOUS | Status: DC | PRN

## 2022-07-07 MED ORDER — DEXAMETHASONE SODIUM PHOSPHATE 10 MG/ML IJ SOLN
INTRAMUSCULAR | Status: AC
  Filled 2022-07-07: qty 1

## 2022-07-07 MED ORDER — DULOXETINE HCL 30 MG PO CPEP
30.0000 mg | ORAL_CAPSULE | Freq: Every day | ORAL | Status: DC
  Administered 2022-07-07 – 2022-07-08 (×2): 30 mg via ORAL
  Filled 2022-07-07 (×2): qty 1

## 2022-07-07 MED ORDER — NITROGLYCERIN 0.4 MG SL SUBL: 0.4000 mg | SUBLINGUAL_TABLET | SUBLINGUAL | Status: DC | PRN

## 2022-07-07 MED ORDER — ISOSORBIDE MONONITRATE ER 30 MG PO TB24
30.0000 mg | ORAL_TABLET | Freq: Two times a day (BID) | ORAL | Status: DC
  Administered 2022-07-07 – 2022-07-09 (×4): 30 mg via ORAL
  Filled 2022-07-07 (×4): qty 1

## 2022-07-07 MED ORDER — DIPHENHYDRAMINE HCL 12.5 MG/5ML PO ELIX
12.5000 mg | ORAL_SOLUTION | ORAL | Status: DC | PRN
  Administered 2022-07-07: 25 mg via ORAL
  Filled 2022-07-07: qty 10

## 2022-07-07 MED ORDER — FENTANYL CITRATE (PF) 250 MCG/5ML IJ SOLN
INTRAMUSCULAR | Status: AC
  Filled 2022-07-07: qty 5

## 2022-07-07 MED ORDER — PANTOPRAZOLE SODIUM 40 MG PO TBEC
40.0000 mg | DELAYED_RELEASE_TABLET | Freq: Every day | ORAL | Status: DC
  Administered 2022-07-08 – 2022-07-09 (×2): 40 mg via ORAL
  Filled 2022-07-07 (×2): qty 1

## 2022-07-07 MED ORDER — FENTANYL CITRATE (PF) 100 MCG/2ML IJ SOLN
25.0000 ug | INTRAMUSCULAR | Status: DC | PRN
  Administered 2022-07-07 (×3): 50 ug via INTRAVENOUS

## 2022-07-07 MED ORDER — 0.9 % SODIUM CHLORIDE (POUR BTL) OPTIME
TOPICAL | Status: DC | PRN
  Administered 2022-07-07: 1000 mL

## 2022-07-07 MED ORDER — VANCOMYCIN HCL 500 MG IV SOLR
INTRAVENOUS | Status: DC | PRN
  Administered 2022-07-07: 500 mg via TOPICAL

## 2022-07-07 MED ORDER — GABAPENTIN 300 MG PO CAPS
300.0000 mg | ORAL_CAPSULE | Freq: Three times a day (TID) | ORAL | Status: DC
  Administered 2022-07-07 – 2022-07-09 (×6): 300 mg via ORAL
  Filled 2022-07-07 (×6): qty 1

## 2022-07-07 MED ORDER — ROCURONIUM BROMIDE 10 MG/ML (PF) SYRINGE
PREFILLED_SYRINGE | INTRAVENOUS | Status: DC | PRN
  Administered 2022-07-07: 50 mg via INTRAVENOUS

## 2022-07-07 MED ORDER — OXYCODONE HCL 5 MG PO TABS
10.0000 mg | ORAL_TABLET | ORAL | Status: DC | PRN
  Filled 2022-07-07: qty 3

## 2022-07-07 MED ORDER — ONDANSETRON HCL 4 MG PO TABS: 4.0000 mg | ORAL_TABLET | Freq: Four times a day (QID) | ORAL | Status: DC | PRN

## 2022-07-07 MED ORDER — DOCUSATE SODIUM 100 MG PO CAPS
100.0000 mg | ORAL_CAPSULE | Freq: Two times a day (BID) | ORAL | Status: DC
  Administered 2022-07-07 – 2022-07-08 (×2): 100 mg via ORAL
  Filled 2022-07-07 (×3): qty 1

## 2022-07-07 MED ORDER — DEXAMETHASONE SODIUM PHOSPHATE 10 MG/ML IJ SOLN
INTRAMUSCULAR | Status: DC | PRN
  Administered 2022-07-07: 5 mg via INTRAVENOUS

## 2022-07-07 MED ORDER — SUGAMMADEX SODIUM 200 MG/2ML IV SOLN
INTRAVENOUS | Status: DC | PRN
  Administered 2022-07-07 (×2): 100 mg via INTRAVENOUS

## 2022-07-07 MED ORDER — ONDANSETRON HCL 4 MG/2ML IJ SOLN
INTRAMUSCULAR | Status: AC
  Filled 2022-07-07: qty 2

## 2022-07-07 MED ORDER — POLYETHYLENE GLYCOL 400 0.25 % OP SOLN: 1.0000 [drp] | Freq: Three times a day (TID) | OPHTHALMIC | Status: DC | PRN

## 2022-07-07 MED ORDER — LIDOCAINE 2% (20 MG/ML) 5 ML SYRINGE
INTRAMUSCULAR | Status: DC | PRN
  Administered 2022-07-07: 100 mg via INTRAVENOUS

## 2022-07-07 MED ORDER — ONDANSETRON HCL 4 MG/2ML IJ SOLN: 4.0000 mg | Freq: Four times a day (QID) | INTRAMUSCULAR | Status: DC | PRN

## 2022-07-07 MED ORDER — METOPROLOL SUCCINATE ER 50 MG PO TB24
50.0000 mg | ORAL_TABLET | Freq: Every day | ORAL | Status: DC
  Administered 2022-07-08 – 2022-07-09 (×2): 50 mg via ORAL
  Filled 2022-07-07 (×2): qty 1

## 2022-07-07 MED ORDER — AMISULPRIDE (ANTIEMETIC) 5 MG/2ML IV SOLN: 10.0000 mg | Freq: Once | INTRAVENOUS | Status: DC | PRN

## 2022-07-07 MED ORDER — OXYCODONE HCL 5 MG PO TABS
5.0000 mg | ORAL_TABLET | ORAL | Status: DC | PRN
  Administered 2022-07-07 – 2022-07-08 (×4): 10 mg via ORAL
  Filled 2022-07-07 (×3): qty 2

## 2022-07-07 MED ORDER — VANCOMYCIN HCL IN DEXTROSE 1-5 GM/200ML-% IV SOLN
1000.0000 mg | Freq: Two times a day (BID) | INTRAVENOUS | Status: AC
  Administered 2022-07-07: 1000 mg via INTRAVENOUS
  Filled 2022-07-07: qty 200

## 2022-07-07 MED ORDER — MIDAZOLAM HCL 2 MG/2ML IJ SOLN
INTRAMUSCULAR | Status: AC
  Filled 2022-07-07: qty 2

## 2022-07-07 MED ORDER — METHOCARBAMOL 500 MG PO TABS
500.0000 mg | ORAL_TABLET | Freq: Four times a day (QID) | ORAL | Status: DC | PRN
  Administered 2022-07-08 (×2): 500 mg via ORAL
  Filled 2022-07-07 (×2): qty 1

## 2022-07-07 MED ORDER — LIDOCAINE 2% (20 MG/ML) 5 ML SYRINGE
INTRAMUSCULAR | Status: AC
  Filled 2022-07-07: qty 5

## 2022-07-07 MED ORDER — ALBUTEROL SULFATE HFA 108 (90 BASE) MCG/ACT IN AERS: 2.0000 | INHALATION_SPRAY | Freq: Four times a day (QID) | RESPIRATORY_TRACT | Status: DC | PRN

## 2022-07-07 MED ORDER — CEFAZOLIN IN SODIUM CHLORIDE 3-0.9 GM/100ML-% IV SOLN
3.0000 g | INTRAVENOUS | Status: AC
  Administered 2022-07-07: 3 g via INTRAVENOUS
  Filled 2022-07-07: qty 100

## 2022-07-07 MED ORDER — CEFAZOLIN SODIUM-DEXTROSE 2-4 GM/100ML-% IV SOLN
INTRAVENOUS | Status: AC
  Filled 2022-07-07: qty 100

## 2022-07-07 MED ORDER — OXYCODONE HCL 5 MG PO TABS
5.0000 mg | ORAL_TABLET | Freq: Once | ORAL | Status: AC | PRN
  Administered 2022-07-07: 5 mg via ORAL

## 2022-07-07 MED ORDER — PROPOFOL 10 MG/ML IV BOLUS
INTRAVENOUS | Status: DC | PRN
  Administered 2022-07-07: 200 mg via INTRAVENOUS

## 2022-07-07 MED ORDER — HYDROMORPHONE HCL 1 MG/ML IJ SOLN: 0.5000 mg | INTRAMUSCULAR | Status: DC | PRN

## 2022-07-07 MED ORDER — FENTANYL CITRATE (PF) 250 MCG/5ML IJ SOLN
INTRAMUSCULAR | Status: DC | PRN
  Administered 2022-07-07 (×5): 50 ug via INTRAVENOUS

## 2022-07-07 MED ORDER — METOCLOPRAMIDE HCL 5 MG PO TABS: 5.0000 mg | ORAL_TABLET | Freq: Three times a day (TID) | ORAL | Status: DC | PRN

## 2022-07-07 MED ORDER — APIXABAN 2.5 MG PO TABS
2.5000 mg | ORAL_TABLET | Freq: Two times a day (BID) | ORAL | Status: DC
  Administered 2022-07-08 – 2022-07-09 (×3): 2.5 mg via ORAL
  Filled 2022-07-07 (×3): qty 1

## 2022-07-07 MED ORDER — ATORVASTATIN CALCIUM 80 MG PO TABS: 80.0000 mg | ORAL_TABLET | Freq: Every day | ORAL | Status: DC

## 2022-07-07 MED ORDER — PROPOFOL 10 MG/ML IV BOLUS
INTRAVENOUS | Status: AC
  Filled 2022-07-07: qty 20

## 2022-07-07 SURGICAL SUPPLY — 61 items
ANCH SUT SWLK 19.1X4.75 (Anchor) ×1 IMPLANT
ANCHOR SUT BIO SW 4.75X19.1 (Anchor) IMPLANT
APL PRP STRL LF DISP 70% ISPRP (MISCELLANEOUS) ×1
APL SKNCLS STERI-STRIP NONHPOA (GAUZE/BANDAGES/DRESSINGS)
BANDAGE ESMARK 6X9 LF (GAUZE/BANDAGES/DRESSINGS) IMPLANT
BENZOIN TINCTURE PRP APPL 2/3 (GAUZE/BANDAGES/DRESSINGS) IMPLANT
BLADE SURG 15 STRL LF DISP TIS (BLADE) ×2 IMPLANT
BLADE SURG 15 STRL SS (BLADE) ×2
BNDG CMPR 9X4 STRL LF SNTH (GAUZE/BANDAGES/DRESSINGS)
BNDG CMPR 9X6 STRL LF SNTH (GAUZE/BANDAGES/DRESSINGS) ×1
BNDG ELASTIC 4X5.8 VLCR STR LF (GAUZE/BANDAGES/DRESSINGS) IMPLANT
BNDG ELASTIC 6X5.8 VLCR STR LF (GAUZE/BANDAGES/DRESSINGS) IMPLANT
BNDG ESMARK 4X9 LF (GAUZE/BANDAGES/DRESSINGS) IMPLANT
BNDG ESMARK 6X9 LF (GAUZE/BANDAGES/DRESSINGS) ×1
CHLORAPREP W/TINT 26 (MISCELLANEOUS) ×1 IMPLANT
CUFF TOURN SGL QUICK 34 (TOURNIQUET CUFF) ×1
CUFF TRNQT CYL 34X4.125X (TOURNIQUET CUFF) IMPLANT
DRAPE C-ARM 42X120 X-RAY (DRAPES) IMPLANT
DRAPE IMP U-DRAPE 54X76 (DRAPES) ×1 IMPLANT
DRAPE OEC MINIVIEW 54X84 (DRAPES) ×1 IMPLANT
DRAPE U-SHAPE 47X51 STRL (DRAPES) ×1 IMPLANT
DRSG XEROFORM 1X8 (GAUZE/BANDAGES/DRESSINGS) IMPLANT
ELECT REM PT RETURN 9FT ADLT (ELECTROSURGICAL) ×1
ELECTRODE REM PT RTRN 9FT ADLT (ELECTROSURGICAL) ×1 IMPLANT
GAUZE PAD ABD 8X10 STRL (GAUZE/BANDAGES/DRESSINGS) IMPLANT
GAUZE SPONGE 4X4 12PLY STRL (GAUZE/BANDAGES/DRESSINGS) ×1 IMPLANT
GAUZE XEROFORM 1X8 LF (GAUZE/BANDAGES/DRESSINGS) ×1 IMPLANT
GLOVE BIOGEL M STRL SZ7.5 (GLOVE) ×1 IMPLANT
GLOVE BIOGEL PI IND STRL 8 (GLOVE) ×1 IMPLANT
GOWN STRL REUS W/ TWL LRG LVL3 (GOWN DISPOSABLE) ×1 IMPLANT
GOWN STRL REUS W/ TWL XL LVL3 (GOWN DISPOSABLE) ×1 IMPLANT
GOWN STRL REUS W/TWL LRG LVL3 (GOWN DISPOSABLE) ×1
GOWN STRL REUS W/TWL XL LVL3 (GOWN DISPOSABLE) ×1
KIT BASIN OR (CUSTOM PROCEDURE TRAY) ×1 IMPLANT
NDL HYPO 25X1 1.5 SAFETY (NEEDLE) IMPLANT
NEEDLE HYPO 25X1 1.5 SAFETY (NEEDLE) IMPLANT
NS IRRIG 1000ML POUR BTL (IV SOLUTION) ×1 IMPLANT
PACK ORTHO EXTREMITY (CUSTOM PROCEDURE TRAY) ×1 IMPLANT
PAD CAST 4YDX4 CTTN HI CHSV (CAST SUPPLIES) ×1 IMPLANT
PADDING CAST COTTON 4X4 STRL (CAST SUPPLIES) ×1
PADDING CAST SYNTHETIC 4X4 STR (CAST SUPPLIES) IMPLANT
SHEET MEDIUM DRAPE 40X70 STRL (DRAPES) ×1 IMPLANT
SLEEVE SCD COMPRESS KNEE MED (STOCKING) ×1 IMPLANT
SPIKE FLUID TRANSFER (MISCELLANEOUS) IMPLANT
SPLINT PLASTER CAST XFAST 5X30 (CAST SUPPLIES) IMPLANT
SPONGE T-LAP 18X18 ~~LOC~~+RFID (SPONGE) IMPLANT
STOCKINETTE 6  STRL (DRAPES)
STOCKINETTE 6 STRL (DRAPES) ×1 IMPLANT
STRIP CLOSURE SKIN 1/2X4 (GAUZE/BANDAGES/DRESSINGS) IMPLANT
SUCTION FRAZIER HANDLE 10FR (MISCELLANEOUS) ×1
SUCTION TUBE FRAZIER 10FR DISP (MISCELLANEOUS) IMPLANT
SUT ETHILON 2 0 FS 18 (SUTURE) IMPLANT
SUT ETHILON 3 0 PS 1 (SUTURE) ×1 IMPLANT
SUT MNCRL AB 3-0 PS2 18 (SUTURE) IMPLANT
SUT PDS AB 2-0 CT2 27 (SUTURE) ×1 IMPLANT
SUT VIC AB 3-0 FS2 27 (SUTURE) IMPLANT
SYR BULB EAR ULCER 3OZ GRN STR (SYRINGE) ×1 IMPLANT
SYR CONTROL 10ML LL (SYRINGE) IMPLANT
TOWEL GREEN STERILE FF (TOWEL DISPOSABLE) ×2 IMPLANT
TUBE CONNECTING 20X1/4 (TUBING) IMPLANT
UNDERPAD 30X36 HEAVY ABSORB (UNDERPADS AND DIAPERS) ×1 IMPLANT

## 2022-07-07 NOTE — Transfer of Care (Signed)
Immediate Anesthesia Transfer of Care Note  Patient: Kasee Masuda  Procedure(s) Performed: RIGHT ANKLE DEEP HARDWARE REMOVAL (Right)  Patient Location: PACU  Anesthesia Type:General  Level of Consciousness: awake, alert , and oriented  Airway & Oxygen Therapy: Patient Spontanous Breathing and Patient connected to nasal cannula oxygen  Post-op Assessment: Report given to RN and Post -op Vital signs reviewed and stable  Post vital signs: Reviewed and stable  Last Vitals:  Vitals Value Taken Time  BP 146/72 07/07/22 1142  Temp    Pulse 102 07/07/22 1145  Resp 14 07/07/22 1145  SpO2 99 % 07/07/22 1145  Vitals shown include unvalidated device data.  Last Pain:  Vitals:   07/07/22 0922  TempSrc:   PainSc: 3          Complications: No notable events documented.

## 2022-07-07 NOTE — H&P (Signed)
PREOPERATIVE H&P  Chief Complaint: Right ankle pain  HPI: Connie Hoffman is a 62 y.o. female who presents for preoperative history and physical with a diagnosis of right Achilles tendon rerupture, open.  Patient underwent Achilles reconstruction with Haglund's excision several months ago and was doing well postoperatively.  Unfortunately she fell onto her right leg out of her boot and had an open rerupture.  She was seen in the emergency department where the wound was washed and closed.  She was subsequently taken back to the operating room to further washed and debrided the wound as well as repair the tendon.  Unfortunately she did not heal the transverse open wound centrally and developed a soft tissue infection.  She has done well with antibiotics but still has a small area of wound that has not healed therefore is here today for repeat excisional debridement of her rerupture and hardware removal. Symptoms are rated as moderate to severe, and have been worsening.  This is significantly impairing activities of daily living.  She has elected for surgical management.   Past Medical History:  Diagnosis Date   Anemia    Anxiety    Chest pain syndrome    Chronic stable angina    DDD (degenerative disc disease), lumbar    Gastritis    Headache    Hematuria    resolved after removing ASA, only on Eliquis now   History of hiatal hernia    Hypertension    Intestinal abscess    MRSA infection    After knee surgery   Observed seizure-like activity (HCC)    Pre-diabetes    Precordial pain    Psoriatic arthritis ([place])    Pulmonary embolism (HCC)    Bilateral lungs   Refusal of blood transfusions as patient is Jehovah's Witness    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CC)    Sacroiliac pain    Seizure ([place])    Spinal stenosis    Thyroid nodule    Past Surgical History:  Procedure Laterality Date   ABDOMINAL HYSTERECTOMY     ARTHRODESIS FOOT WITH WEIL OSTEOTOMY Right 03/26/2022   Procedure:  GASTROCNEMIUS RECESSION, CALCANEAL EXOSTECTOMY;  Surgeon: Erle Crocker, MD;  Location: [place];  Service: Orthopedics;  Laterality: Right;   BACK SURGERY  05/18/2019   L4 and L5   CARDIAC CATHETERIZATION     CHOLECYSTECTOMY     CYSTECTOMY  07/2017   JOINT REPLACEMENT Left    left knee   LAPAROSCOPIC ENDOMETRIOSIS FULGURATION  2011   LAPAROSCOPIC GASTRIC SLEEVE RESECTION  2018   LAPAROSCOPIC OOPHERECTOMY Right    LUMBAR EPIDURAL INJECTION  04/13/2018   REVISION TOTAL KNEE ARTHROPLASTY Left 12/2017   TENOLYSIS Right 03/26/2022   Procedure: RIGHT ACHILLES TENDON RECONSTRUCTION AND DEBRIDEMENT;  Surgeon: Erle Crocker, MD;  Location: [place];  Service: Orthopedics;  Laterality: Right;  LENGTH OF SURGERY: 120 MINUTES   TOTAL SHOULDER ARTHROPLASTY Bilateral    TRIGGER FINGER RELEASE Bilateral 2023   bilateral 3rd digit   TUBAL LIGATION     postpartum   ULNAR NERVE REPAIR Bilateral 2023   Social History   Socioeconomic History   Marital status: Married    Spouse name: Not on file   Number of children: Not on file   Years of education: Not on file   Highest education level: Not on file  Occupational History   Not on file  Tobacco Use   Smoking status: Never    Passive exposure: Past   Smokeless tobacco: Never  Vaping Use   Vaping Use: Never used  Substance and Sexual Activity   Alcohol use: Never   Drug use: Never   Sexual activity: Yes    Birth control/protection: Surgical, None  Other Topics Concern   Not on file  Social History Narrative   Right handed    Lives with husband and 1 daughters    Social Determinants of Health   Financial Resource Strain: Not on file  Food Insecurity: No Food Insecurity (12/04/2019)   Hunger Vital Sign    Worried About Running Out of Food in the Last Year: Never true    Ran Out of Food in the Last Year: Never true  Transportation Needs: No Transportation Needs (12/04/2019)   PRAPARE - Hydrologist  (Medical): No    Lack of Transportation (Non-Medical): No  Physical Activity: Not on file  Stress: Not on file  Social Connections: Not on file   Family History  Problem Relation Age of Onset   Diabetes Mother    Hypertension Mother    Heart Problems Mother    Congestive Heart Failure Mother    Diabetes Father    Hypertension Father    Psoriasis Father    Stroke Sister    Heart Problems Sister    Seizures Sister    Prostate cancer Brother    Diabetes Brother    Asthma Daughter    Healthy Daughter    Healthy Son    Healthy Son    Allergies  Allergen Reactions   Aspirin Other (See Comments)    GI bleeding   Tylenol [Acetaminophen] Other (See Comments)    Raised patient's liver enzymes   Prior to Admission medications   Medication Sig Start Date End Date Taking? Authorizing Provider  apixaban (ELIQUIS) 2.5 MG TABS tablet Take 2.5 mg by mouth 2 (two) times daily. 04/25/21  Yes [provider]  Apremilast (OTEZLA) 30 MG TABS Take 1 tablet (30 mg total) by mouth in the morning and at bedtime. 06/22/22  Yes Ofilia Neas, PA-C  atorvastatin (LIPITOR) 80 MG tablet Take 80 mg by mouth daily.   Yes [provider]  dexlansoprazole (DEXILANT) 60 MG capsule Take 60 mg by mouth daily.   Yes [provider]  DULoxetine (CYMBALTA) 30 MG capsule Take 30 mg by mouth at bedtime.   Yes [provider]  gabapentin (NEURONTIN) 300 MG capsule Take 1 capsule (300 mg total) by mouth 3 (three) times daily. 06/06/21  Yes Patel, Donika K, DO  isosorbide mononitrate (IMDUR) 30 MG 24 hr tablet Take 30 mg by mouth 2 (two) times daily. 10/13/21  Yes [provider]  metoprolol succinate (TOPROL-XL) 50 MG 24 hr tablet Take 50 mg by mouth daily.   Yes [provider]  Polyethylene Glycol 400 (BLINK TEARS) 0.25 % SOLN Place 1 drop into both eyes 3 (three) times daily as needed (Dry eyes).   Yes [provider]  albuterol (VENTOLIN HFA) 108 (90  Base) MCG/ACT inhaler Inhale 2 puffs into the lungs every 6 (six) hours as needed for wheezing. 07/10/21   [provider]  Menthol, Topical Analgesic, (BIOFREEZE) 4 % GEL Apply 1 application  topically daily as needed (pain). Apply to painful sites daily as directed    [provider]  Menthol, Topical Analgesic, (ICY HOT EX) Apply 1 Application topically daily as needed (pain).    [provider]  nitroGLYCERIN (NITROSTAT) 0.4 MG SL tablet Place 0.4 mg under  the tongue every 5 (five) minutes as needed for chest pain. 02/15/21   [provider]  NONFORMULARY OR COMPOUNDED ITEM Apply 1 Application topically daily as needed (rash, itching). Triamcinolone compounded with unknown    [provider]     Positive ROS: All other systems have been reviewed and were otherwise negative with the exception of those mentioned in the HPI and as above.  Physical Exam:  Vitals:   07/07/22 0803  BP: (!) 186/70  Pulse: 78  Resp: 18  Temp: 98 F (36.7 C)  SpO2: 99%   General: Alert, no acute distress Cardiovascular: No pedal edema Respiratory: No cyanosis, no use of accessory musculature GI: No organomegaly, abdomen is soft and non-tender Skin: No lesions in the area of chief complaint Neurologic: Sensation intact distally Psychiatric: Patient is competent for consent with normal mood and affect Lymphatic: No axillary or cervical lymphadenopathy  MUSCULOSKELETAL: Right ankle demonstrates small open wound centrally about the Achilles tendon at the area of surgical incision and traumatic laceration there is exposed tendon.  There is some drainage.  No foul odor.  Assessment: Right Achilles tendon open rerupture with soft tissue infection and nonhealing skin with retained orthopedic hardware   Plan: Plan for plan is for opening of the wound and debriding the diseased tendon and nonabsorbable suture material as well as suture anchors consistent with  orthopedic hardware within the calcaneus to eradicate her infection.  Will also potentially place a dermal graft to aid in skin healing.  She understands that this will leave her without a functioning Achilles tendon and she is willing to proceed.  Main issue now is to get the skin to heal and to eradicate her infection.  Later on we can either do a staged reconstruction or ankle arthrodesis depending on functional level once the skin is healed and the infection is cleared.  Will plan to admit the patient to the hospital tonight for observation for IV antibiotics and for culture.  We discussed the risks, benefits and alternatives of surgery which include but are not limited to wound healing complications, infection, nonunion, malunion, need for further surgery, damage to surrounding structures and continued pain.  They understand there is no guarantees to an acceptable outcome.  After weighing these risks they opted to proceed with surgery.     Erle Crocker, MD    07/07/2022 9:23 AM

## 2022-07-07 NOTE — Anesthesia Procedure Notes (Addendum)
Procedure Name: Intubation Date/Time: 07/07/2022 10:20 AM  Performed by: Reggie Pile, CRNAPre-anesthesia Checklist: Patient identified, Emergency Drugs available, Suction available and Patient being monitored Patient Re-evaluated:Patient Re-evaluated prior to induction Oxygen Delivery Method: Circle system utilized Preoxygenation: Pre-oxygenation with 100% oxygen Induction Type: IV induction Ventilation: Mask ventilation without difficulty Laryngoscope Size: Miller and 2 Grade View: Grade I Tube type: Oral Tube size: 7.0 mm Number of attempts: 1 Airway Equipment and Method: Stylet and Oral airway (soft bite block) Placement Confirmation: ETT inserted through vocal cords under direct vision, positive ETCO2 and breath sounds checked- equal and bilateral Secured at: 22 cm Tube secured with: Tape Dental Injury: Teeth and Oropharynx as per pre-operative assessment

## 2022-07-07 NOTE — Brief Op Note (Signed)
07/07/2022  11:25 AM  PATIENT:  Connie Hoffman  62 y.o. female  PRE-OPERATIVE DIAGNOSIS:  RIGHT ANKLE INFECTION, RETAINED ORTHOPEDIC HARDWARE, POSTERIOR LEG SKIN LOSS  POST-OPERATIVE DIAGNOSIS:  RIGHT ANKLE INFECTION, RETAINED ORTHOPEDIC HARDWARE, POSTERIOR LEG SKIN LOSS Surgical wound dehiscence right ankle   PROCEDURE:  Procedure(s): RIGHT ANKLE DEEP HARDWARE REMOVAL (Right) Excisional debridement skin, subcutaneous tissue, tendon, bone Repair of surgical wound dehiscence    SURGEON:  Surgeon(s) and Role:    Erle Crocker, MD - Primary  PHYSICIAN ASSISTANT:  J Martinique PA-C   ASSISTANTS: none   ANESTHESIA:   general  EBL:  5 mL   BLOOD ADMINISTERED:none  DRAINS: none   LOCAL MEDICATIONS USED:  OTHER '500mg'$  Vancomycin powder  SPECIMEN:  Source of Specimen:  Calcaneal bone, achilles tendon material  DISPOSITION OF SPECIMEN:   Microbiology  COUNTS:  YES  TOURNIQUET:   Total Tourniquet Time Documented: Thigh (Right) - 53 minutes Total: Thigh (Right) - 53 minutes   DICTATION: .Viviann Spare Dictation  PLAN OF CARE: Admit for overnight observation  PATIENT DISPOSITION:  PACU - hemodynamically stable.   Delay start of Pharmacological VTE agent (>24hrs) due to surgical blood loss or risk of bleeding: yes

## 2022-07-07 NOTE — Anesthesia Postprocedure Evaluation (Signed)
Anesthesia Post Note  Patient: Cassy Shean  Procedure(s) Performed: RIGHT ANKLE DEEP HARDWARE REMOVAL (Right)     Patient location during evaluation: PACU Anesthesia Type: General Level of consciousness: awake Pain management: pain level controlled Vital Signs Assessment: post-procedure vital signs reviewed and stable Respiratory status: spontaneous breathing, nonlabored ventilation and respiratory function stable Cardiovascular status: blood pressure returned to baseline and stable Postop Assessment: no apparent nausea or vomiting Anesthetic complications: no   No notable events documented.  Last Vitals:  Vitals:   07/07/22 1230 07/07/22 1245  BP: (!) 158/75 (!) 158/75  Pulse: (!) 104 89  Resp: (!) 25 18  Temp:    SpO2: 95% 95%    Last Pain:  Vitals:   07/07/22 1245  TempSrc:   PainSc: 10-Worst pain ever        RLE Motor Response: Purposeful movement (07/07/22 1245) RLE Sensation: Full sensation (07/07/22 1245)      Nilda Simmer

## 2022-07-08 ENCOUNTER — Encounter (HOSPITAL_COMMUNITY): Payer: Self-pay | Admitting: Orthopaedic Surgery

## 2022-07-08 DIAGNOSIS — I10 Essential (primary) hypertension: Secondary | ICD-10-CM

## 2022-07-08 DIAGNOSIS — Z87898 Personal history of other specified conditions: Secondary | ICD-10-CM

## 2022-07-08 DIAGNOSIS — D62 Acute posthemorrhagic anemia: Secondary | ICD-10-CM | POA: Diagnosis not present

## 2022-07-08 DIAGNOSIS — M25571 Pain in right ankle and joints of right foot: Secondary | ICD-10-CM | POA: Diagnosis not present

## 2022-07-08 DIAGNOSIS — R7303 Prediabetes: Secondary | ICD-10-CM | POA: Diagnosis not present

## 2022-07-08 DIAGNOSIS — S86011A Strain of right Achilles tendon, initial encounter: Secondary | ICD-10-CM | POA: Diagnosis not present

## 2022-07-08 DIAGNOSIS — R739 Hyperglycemia, unspecified: Secondary | ICD-10-CM | POA: Insufficient documentation

## 2022-07-08 DIAGNOSIS — F419 Anxiety disorder, unspecified: Secondary | ICD-10-CM | POA: Insufficient documentation

## 2022-07-08 DIAGNOSIS — E119 Type 2 diabetes mellitus without complications: Secondary | ICD-10-CM

## 2022-07-08 DIAGNOSIS — Z8659 Personal history of other mental and behavioral disorders: Secondary | ICD-10-CM

## 2022-07-08 DIAGNOSIS — Z86711 Personal history of pulmonary embolism: Secondary | ICD-10-CM

## 2022-07-08 LAB — HEMOGLOBIN A1C
Hgb A1c MFr Bld: 7.3 % — ABNORMAL HIGH (ref 4.8–5.6)
Mean Plasma Glucose: 163 mg/dL

## 2022-07-08 LAB — CBC
HCT: 29.6 % — ABNORMAL LOW (ref 36.0–46.0)
Hemoglobin: 9.8 g/dL — ABNORMAL LOW (ref 12.0–15.0)
MCH: 28.7 pg (ref 26.0–34.0)
MCHC: 33.1 g/dL (ref 30.0–36.0)
MCV: 86.8 fL (ref 80.0–100.0)
Platelets: 346 10*3/uL (ref 150–400)
RBC: 3.41 MIL/uL — ABNORMAL LOW (ref 3.87–5.11)
RDW: 13.2 % (ref 11.5–15.5)
WBC: 8.7 10*3/uL (ref 4.0–10.5)
nRBC: 0 % (ref 0.0–0.2)

## 2022-07-08 LAB — COMPREHENSIVE METABOLIC PANEL
ALT: 51 U/L — ABNORMAL HIGH (ref 0–44)
AST: 50 U/L — ABNORMAL HIGH (ref 15–41)
Albumin: 3 g/dL — ABNORMAL LOW (ref 3.5–5.0)
Alkaline Phosphatase: 102 U/L (ref 38–126)
Anion gap: 9 (ref 5–15)
BUN: 9 mg/dL (ref 6–20)
CO2: 23 mmol/L (ref 22–32)
Calcium: 8.9 mg/dL (ref 8.9–10.3)
Chloride: 104 mmol/L (ref 98–111)
Creatinine, Ser: 0.81 mg/dL (ref 0.44–1.00)
GFR, Estimated: >60 mL/min (ref >60)
Glucose, Bld: 330 mg/dL — ABNORMAL HIGH (ref 70–99)
Potassium: 4.4 mmol/L (ref 3.5–5.1)
Sodium: 136 mmol/L (ref 135–145)
Total Bilirubin: 0.4 mg/dL (ref 0.3–1.2)
Total Protein: 6.5 g/dL (ref 6.5–8.1)

## 2022-07-08 LAB — GLUCOSE, CAPILLARY: Glucose-Capillary: 278 mg/dL — ABNORMAL HIGH (ref 70–99)

## 2022-07-08 MED ORDER — INSULIN ASPART 100 UNIT/ML IJ SOLN
0.0000 [IU] | Freq: Three times a day (TID) | INTRAMUSCULAR | Status: DC
  Administered 2022-07-08: 2 [IU] via SUBCUTANEOUS
  Administered 2022-07-09: 1 [IU] via SUBCUTANEOUS

## 2022-07-08 MED ORDER — ATORVASTATIN CALCIUM 80 MG PO TABS
80.0000 mg | ORAL_TABLET | Freq: Every day | ORAL | Status: DC
  Administered 2022-07-08: 80 mg via ORAL
  Filled 2022-07-08: qty 1

## 2022-07-08 MED ORDER — ONDANSETRON HCL 4 MG/2ML IJ SOLN: 4.0000 mg | Freq: Four times a day (QID) | INTRAMUSCULAR | Status: DC | PRN

## 2022-07-08 MED ORDER — LIVING WELL WITH DIABETES BOOK
Freq: Once | Status: AC
  Filled 2022-07-08: qty 1

## 2022-07-08 NOTE — Evaluation (Signed)
Physical Therapy Evaluation Patient Details Name: Connie Hoffman MRN: [id number] DOB: Jun 28, 1960 Today's Date: 07/08/2022  History of Present Illness  Pt is a 62 yo female presenting for excisional debridement of re-ruptured achilles and hardware removal post achilles tendon repair several months ago and a re-rupture that occurred last week without full wound healing after I and D. PMH: Anemia, anxiety, chest pain syndrome, chronic stable angina, DDD, gastritis, headache, hematuria, hx hiatal hernia, HTN, intestinal abscess, MRSA, Pre diabetes, precordial pain, PE, renal artery aneurysm, sacroiliac pain, seizure, spinal stenosis, thyroid nodule.  Clinical Impression  Pt is presenting at baseline since November of last year (2023) She is able to maintain her WB precautions well and is Mod I for bed mobility with extra time, transfers and sit to stand with use of AD. Pt spouse and daughter are available most of the time. Due to current functional status, home set up and available assistance at home recommending skilled physical therapy services in OPPT setting once pt is able to WB through the LE in order to address functional activities and return to age related activities. Pt is Mod I at this time and no longer requires skilled physical therapy services in acute care hospital setting. Please re-consult if new needs arise.      Recommendations for follow up therapy are one component of a multi-disciplinary discharge planning process, led by the attending physician.  Recommendations may be updated based on patient status, additional functional criteria and insurance authorization.  Follow Up Recommendations Outpatient PT (once pt is WB)      Assistance Recommended at Discharge Set up Supervision/Assistance  Patient can return home with the following  Assist for transportation;Help with stairs or ramp for entrance;Assistance with cooking/housework;A little help with walking and/or transfers     Equipment Recommendations None recommended by PT  Recommendations for Other Services       Functional Status Assessment Patient has not had a recent decline in their functional status     Precautions / Restrictions Precautions Precautions: Fall Restrictions Weight Bearing Restrictions: Yes RLE Weight Bearing: Non weight bearing      Mobility  Bed Mobility Overal bed mobility: Modified Independent             General bed mobility comments: increased time; good maintenance of NWB precautions Patient Response: Cooperative  Transfers Overall transfer level: Modified independent Equipment used: Rolling walker (2 wheels) Transfers: Sit to/from Stand, Bed to chair/wheelchair/BSC Sit to Stand: Modified independent (Device/Increase time)   Step pivot transfers: Modified independent (Device/Increase time)       General transfer comment: Hop to gait pattern with spouse assisting with setup of W/C    Ambulation/Gait Ambulation/Gait assistance: Modified independent (Device/Increase time) Gait Distance (Feet): 10 Feet Assistive device: Rolling walker (2 wheels) Gait Pattern/deviations: Step-to pattern   Gait velocity interpretation: <1.31 ft/sec, indicative of household ambulator   General Gait Details: Step to gait pattern while maintaining WB precautions      Balance Overall balance assessment: Mild deficits observed, not formally tested       Pertinent Vitals/Pain Pain Assessment Pain Assessment: No/denies pain    Home Living Family/patient expects to be discharged to:: Private residence Living Arrangements: Spouse/significant other;Children (husband and daughter) Available Help at Discharge: Family;Available PRN/intermittently (may be alone 4-5 hours by herself, but sister visits during that time a lot) Type of Home: House Home Access: Stairs to enter Entrance Stairs-Rails: None Entrance Stairs-Number of Steps: 1/2 step   Home Layout: One level Home  Equipment:  BSC/3in1;Wheelchair - manual;Cane - single point;Grab bars - toilet;Hand held shower head;Toilet riser;Shower seat      Prior Function Prior Level of Function : Independent/Modified Independent;Driving             Mobility Comments: walker in home, wc for longer distances ADLs Comments: was showering independently, changing bandages independently, walker for short distances.     Hand Dominance   Dominant Hand: Right    Extremity/Trunk Assessment   Upper Extremity Assessment Upper Extremity Assessment: Defer to OT evaluation    Lower Extremity Assessment Lower Extremity Assessment: Overall WFL for tasks assessed    Cervical / Trunk Assessment Cervical / Trunk Assessment: Normal  Communication   Communication: No difficulties  Cognition Arousal/Alertness: Awake/alert Behavior During Therapy: WFL for tasks assessed/performed Overall Cognitive Status: Within Functional Limits for tasks assessed          General Comments General comments (skin integrity, edema, etc.): pt spouse present throughout session and very attentive and helpful. Pt spouse and daughter will be with her almost all of the time at home.        Assessment/Plan    PT Assessment All further PT needs can be met in the next venue of care  PT Problem List Decreased balance;Decreased mobility           PT Goals (Current goals can be found in the Care Plan section)  Acute Rehab PT Goals PT Goal Formulation: All assessment and education complete, DC therapy    Frequency          AM-PAC PT "6 Clicks" Mobility  Outcome Measure Help needed turning from your back to your side while in a flat bed without using bedrails?: None Help needed moving from lying on your back to sitting on the side of a flat bed without using bedrails?: None Help needed moving to and from a bed to a chair (including a wheelchair)?: None Help needed standing up from a chair using your arms (e.g., wheelchair or  bedside chair)?: None Help needed to walk in hospital room?: A Little Help needed climbing 3-5 steps with a railing? : Total 6 Click Score: 20    End of Session   Activity Tolerance: Patient tolerated treatment well Patient left: in chair;with family/visitor present Nurse Communication: Mobility status      Time: [id number] PT Time Calculation (min) (ACUTE ONLY): 14 min   Charges:   PT Evaluation $PT Eval Low Complexity: [place], DPT, [place] Office: [phone] (Secure chat preferred)   Ander Purpura 07/08/2022, 12:25 PM

## 2022-07-08 NOTE — Progress Notes (Signed)
Pt requests sleep med, reports takes 2 Tylenol PM sometimes at home, on-call doctor notified and ok to give '25mg'$  of Benadryl for sleep.

## 2022-07-08 NOTE — Consult Note (Signed)
Consult note   Connie Hoffman [id number] DOB: 1961-03-02 DOA: 07/07/2022  PCP: Jenel Lucks, PA-C Patient coming from: home  Chief Complaint:  Right ankle pain, hyperglycemia and elevated HGB A1C  HPI: Connie Hoffman is a 62 y.o. female with medical history significant of prediabetes, HTN, PE, seizure and anxiety who presented with right ankle pain.  She was admitted to orthopedic service and underwent right ankle surgery on 07/07/2022.  [facility] hospital[facility] team is consulted for hyperglycemia, elevated hemoglobin A1c and medical management.  Patient reports that she has a diagnosis of prediabetes and not on any medical treatment for it.  She was just told that she had elevated hemoglobin A1c 7.3 today.  She denies polydipsia or polyuria.  Review of Systems: As per HPI otherwise 10 point review of systems negative.  Review of Systems Otherwise negative except as per HPI, including: General: Denies fever, chills, night sweats or unintended weight loss. Resp: Denies cough, wheezing, shortness of breath. Cardiac: Denies chest pain, palpitations, orthopnea, paroxysmal nocturnal dyspnea. GI: Denies abdominal pain, nausea, vomiting, diarrhea or constipation GU: Denies dysuria, frequency, hesitancy or incontinence MS: Denies muscle aches, joint pain or swelling.  Right ankle pain and status post surgery Neuro: Denies headache, neurologic deficits (focal weakness, numbness, tingling), abnormal gait Psych: Denies anxiety, depression, SI/HI/AVH Skin: Denies new rashes or lesions ID: Denies sick contacts, exotic exposures, travel  Past Medical History:  Diagnosis Date   Anemia    Anxiety    Chest pain syndrome    Chronic stable angina    DDD (degenerative disc disease), lumbar    Gastritis    Headache    Hematuria    resolved after removing ASA, only on Eliquis now   History of hiatal hernia    Hypertension    Intestinal abscess    MRSA infection    After knee  surgery   Observed seizure-like activity (HCC)    Pre-diabetes    Precordial pain    Psoriatic arthritis ([place])    Pulmonary embolism ([place])    Bilateral lungs   Refusal of blood transfusions as patient is Jehovah's Witness    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CC)    Sacroiliac pain    Seizure ([place])    Spinal stenosis    Thyroid nodule     Past Surgical History:  Procedure Laterality Date   ABDOMINAL HYSTERECTOMY     ARTHRODESIS FOOT WITH WEIL OSTEOTOMY Right 03/26/2022   Procedure: GASTROCNEMIUS RECESSION, CALCANEAL EXOSTECTOMY;  Surgeon: Erle Crocker, MD;  Location: [facility];  Service: Orthopedics;  Laterality: Right;   BACK SURGERY  05/18/2019   L4 and L5   CARDIAC CATHETERIZATION     CHOLECYSTECTOMY     CYSTECTOMY  07/2017   JOINT REPLACEMENT Left    left knee   LAPAROSCOPIC ENDOMETRIOSIS FULGURATION  2011   LAPAROSCOPIC GASTRIC SLEEVE RESECTION  2018   LAPAROSCOPIC OOPHERECTOMY Right    LUMBAR EPIDURAL INJECTION  04/13/2018   MINOR HARDWARE REMOVAL Right 07/07/2022   Procedure: RIGHT ANKLE DEEP HARDWARE REMOVAL;  Surgeon: Erle Crocker, MD;  Location: [facility];  Service: Orthopedics;  Laterality: Right;   REVISION TOTAL KNEE ARTHROPLASTY Left 12/2017   TENOLYSIS Right 03/26/2022   Procedure: RIGHT ACHILLES TENDON RECONSTRUCTION AND DEBRIDEMENT;  Surgeon: Erle Crocker, MD;  Location: [facility];  Service: Orthopedics;  Laterality: Right;  LENGTH OF SURGERY: 120 MINUTES   TOTAL SHOULDER ARTHROPLASTY Bilateral    TRIGGER FINGER RELEASE Bilateral 2023   bilateral 3rd digit  TUBAL LIGATION     postpartum   ULNAR NERVE REPAIR Bilateral 2023    SOCIAL HISTORY:  reports that she has never smoked. She has been exposed to tobacco smoke. She has never used smokeless tobacco. She reports that she does not drink alcohol and does not use drugs.  Allergies  Allergen Reactions   Aspirin Other (See Comments)    GI bleeding   Tylenol [Acetaminophen] Other (See Comments)     Raised patient's liver enzymes    FAMILY HISTORY: Family History  Problem Relation Age of Onset   Diabetes Mother    Hypertension Mother    Heart Problems Mother    Congestive Heart Failure Mother    Diabetes Father    Hypertension Father    Psoriasis Father    Stroke Sister    Heart Problems Sister    Seizures Sister    Prostate cancer Brother    Diabetes Brother    Asthma Daughter    Healthy Daughter    Healthy Son    Healthy Son      Prior to Admission medications   Medication Sig Start Date End Date Taking? Authorizing Provider  apixaban (ELIQUIS) 2.5 MG TABS tablet Take 2.5 mg by mouth 2 (two) times daily. 04/25/21  Yes [provider]  Apremilast (OTEZLA) 30 MG TABS Take 1 tablet (30 mg total) by mouth in the morning and at bedtime. 06/22/22  Yes Ofilia Neas, PA-C  atorvastatin (LIPITOR) 80 MG tablet Take 80 mg by mouth daily.   Yes [provider]  dexlansoprazole (DEXILANT) 60 MG capsule Take 60 mg by mouth daily.   Yes [provider]  DULoxetine (CYMBALTA) 30 MG capsule Take 30 mg by mouth at bedtime.   Yes [provider]  gabapentin (NEURONTIN) 300 MG capsule Take 1 capsule (300 mg total) by mouth 3 (three) times daily. 06/06/21  Yes Patel, Donika K, DO  isosorbide mononitrate (IMDUR) 30 MG 24 hr tablet Take 30 mg by mouth 2 (two) times daily. 10/13/21  Yes [provider]  metoprolol succinate (TOPROL-XL) 50 MG 24 hr tablet Take 50 mg by mouth daily.   Yes [provider]  Polyethylene Glycol 400 (BLINK TEARS) 0.25 % SOLN Place 1 drop into both eyes 3 (three) times daily as needed (Dry eyes).   Yes [provider]  albuterol (VENTOLIN HFA) 108 (90 Base) MCG/ACT inhaler Inhale 2 puffs into the lungs every 6 (six) hours as needed for wheezing. 07/10/21   [provider]  Menthol, Topical Analgesic, (BIOFREEZE) 4 % GEL Apply 1 application  topically daily as needed (pain). Apply to painful sites  daily as directed    [provider]  Menthol, Topical Analgesic, (ICY HOT EX) Apply 1 Application topically daily as needed (pain).    [provider]  nitroGLYCERIN (NITROSTAT) 0.4 MG SL tablet Place 0.4 mg under the tongue every 5 (five) minutes as needed for chest pain. 02/15/21   [provider]  NONFORMULARY OR COMPOUNDED ITEM Apply 1 Application topically daily as needed (rash, itching). Triamcinolone compounded with unknown    [provider]    Physical Exam: Vitals:   07/08/22 0021 07/08/22 0527 07/08/22 0820 07/08/22 1228  BP: (!) 141/78 138/70 (!) 157/77 (!) 146/76  Pulse: 97 90 (!) 109 87  Resp: '16 16 18 18  '$ Temp: 98 F (36.7 C) 97.9 F (36.6 C) (!) 97.4 F (36.3 C) 97.8 F (36.6 C)  TempSrc: Oral Oral Oral Oral  SpO2: 100% 100% 100% 100%  Weight:      Height:          Constitutional: NAD, calm, comfortable. Eyes: PERRL, lids and conjunctivae normal ENMT: Mucous membranes are moist. Posterior pharynx clear of any exudate or lesions.Normal dentition.  Neck: normal, supple, no masses, no thyromegaly Respiratory: clear to auscultation bilaterally, no wheezing, no crackles. Normal respiratory effort. No accessory muscle use.  Cardiovascular: Regular rate and rhythm, no murmurs / rubs / gallops. No extremity edema. 2+ pedal pulses. No carotid bruits.  Abdomen: no tenderness, no masses palpated. No hepatosplenomegaly. Bowel sounds positive.  Musculoskeletal: no clubbing / cyanosis. No joint deformity upper and lower extremities. Good ROM, no contractures. Normal muscle tone.  Right ankle stat postsurgical dressing..  Skin: no rashes, lesions, ulcers. No induration Neurologic: CN 2-12 grossly intact. Sensation intact, DTR normal. Strength 5/5 in all 4.  Psychiatric: Normal judgment and insight. Alert and oriented x 3. Normal mood.     Labs on Admission: I have personally reviewed following labs and imaging studies  CBC: Recent Labs   Lab 07/06/22 0831 07/08/22 0139  WBC 5.6 8.7  HGB 12.3 9.8*  HCT 38.7 29.6*  MCV 89.2 86.8  PLT 354 [id number]   Basic Metabolic Panel: Recent Labs  Lab 07/06/22 0831 07/08/22 0139  Cinnamon Morency 141 136  K 3.8 4.4  CL 105 104  CO2 22 23  GLUCOSE 153* 330*  BUN 11 9  CREATININE 0.73 0.81  CALCIUM 9.2 8.9   GFR: Estimated Creatinine Clearance: 97.8 mL/min (by C-G formula based on SCr of 0.81 mg/dL). Liver Function Tests: Recent Labs  Lab 07/08/22 0139  AST 50*  ALT 51*  ALKPHOS 102  BILITOT 0.4  PROT 6.5  ALBUMIN 3.0*   No results for input(s): "LIPASE", "AMYLASE" in the last 168 hours. No results for input(s): "AMMONIA" in the last 168 hours. Coagulation Profile: No results for input(s): "INR", "PROTIME" in the last 168 hours. Cardiac Enzymes: No results for input(s): "CKTOTAL", "CKMB", "CKMBINDEX", "TROPONINI" in the last 168 hours. BNP (last 3 results) No results for input(s): "PROBNP" in the last 8760 hours. HbA1C: Recent Labs    07/06/22 0831  HGBA1C 7.3*   CBG: Recent Labs  Lab 07/08/22 1208  GLUCAP 278*   Lipid Profile: No results for input(s): "CHOL", "HDL", "LDLCALC", "TRIG", "CHOLHDL", "LDLDIRECT" in the last 72 hours. Thyroid Function Tests: No results for input(s): "TSH", "T4TOTAL", "FREET4", "T3FREE", "THYROIDAB" in the last 72 hours. Anemia Panel: No results for input(s): "VITAMINB12", "FOLATE", "FERRITIN", "TIBC", "IRON", "RETICCTPCT" in the last 72 hours. Urine analysis:    Component Value Date/Time   COLORURINE YELLOW 10/22/2019 1115   APPEARANCEUR HAZY (A) 10/22/2019 1115   LABSPEC 1.031 (H) 10/22/2019 1115   PHURINE 5.0 10/22/2019 1115   GLUCOSEU NEGATIVE 10/22/2019 1115   HGBUR NEGATIVE 10/22/2019 1115   BILIRUBINUR Negative 08/20/2020 1344   KETONESUR NEGATIVE 10/22/2019 1115   PROTEINUR Negative 08/20/2020 1344   PROTEINUR NEGATIVE 10/22/2019 1115   UROBILINOGEN 0.2 08/20/2020 1344   NITRITE Negative 08/20/2020 1344   NITRITE NEGATIVE  10/22/2019 1115   LEUKOCYTESUR Negative 08/20/2020 1344   LEUKOCYTESUR NEGATIVE 10/22/2019 1115   Sepsis Labs: !!!!!!!!!!!!!!!!!!!!!!!!!!!!!!!!!!!!!!!!!!!! '@LABRCNTIP'$ (procalcitonin:4,lacticidven:4) ) Recent Results (from the past 240 hour(s))  Surgical pcr screen     Status: None   Collection Time: 07/06/22 11:01 AM   Specimen: Nasal Mucosa; Nasal Swab  Result Value Ref Range Status   MRSA, PCR NEGATIVE NEGATIVE Final   Staphylococcus aureus NEGATIVE NEGATIVE Final  Comment: (NOTE) The Xpert SA Assay (FDA approved for NASAL specimens in patients 1 years of age and older), is one component of a comprehensive surveillance program. It is not intended to diagnose infection nor to guide or monitor treatment. Performed at [facility], [place] [address] [place], [place] [address]   Aerobic/Anaerobic Culture w Gram Stain (surgical/deep wound)     Status: None (Preliminary result)   Collection Time: 07/07/22 10:51 AM   Specimen: PATH Soft tissue  Result Value Ref Range Status   Specimen Description TISSUE  Final   Special Requests  RIGHT ACHILLES  Final   Gram Stain NO WBC SEEN NO ORGANISMS SEEN   Final   Culture   Final    NO GROWTH < 24 HOURS Performed at [facility], [place] [address], [place], [place] [address]    Report Status PENDING  Incomplete     Radiological Exams on Admission: DG MINI C-ARM IMAGE ONLY  Result Date: 07/07/2022 There is no interpretation for this exam.  This order is for images obtained during a surgical procedure.  Please See "Surgeries" Tab for more information regarding the procedure.     All images have been reviewed by me personally.  EKG: Independently reviewed.   Assessment/Plan Principal Problem:   Right ankle pain Active Problems:   History of pulmonary embolism   Chronic anticoagulation   ABLA (acute blood loss anemia)   T2DM (type 2 diabetes mellitus) ([place])   Hyperglycemia   History of seizure   Essential hypertension    Morbid obesity with BMI of 40.0-44.9, adult [facility])   Anxiety     Assessment and Plan: Assessment Plan  # Right ankle pain and status post right ankle surgery on 07/07/2022 # ABLA- HGB 9.8 from 12/3 on 07/06/22  -Management deferred to orthopedic surgery    # hyperglycemia BG 300's #Newly diagnosed T2DM # History of prediabetes - HGB 7.3 this admission - will start diabetic carb modified diet -Diabetic educator education -Will start SSI - titrate insulin up pending her response to insulin - given fasting BG 153 this am and HGB 7.3, will hold on basal insulin for now. Monitoring BG    # Hypertension - chronic and stable -Continue home Imdur, Toprol-XL,   # History of PE -chronic and stable -Continue home Eliquis per surgical team   # History of seizures -chronic and stable -Not on home AED meds  # History of anxiety - chronic and stable -Continue home Cymbalta   # Morbid obesity  Body mass index is 44.6 kg/m.  - weight loss per PCP as outpt      Body mass index is 44.6 kg/m.      DVT prophylaxis: SCD .  Surgical team ordered to continue Eliquis Code Status: Full code Family Communication: Husband at bedside Consults called: None   Time Spent: 65 minutes.  >50% of the time was devoted to discussing the patients care, assessment, plan and disposition with other care givers along with counseling the patient about the risks and benefits of treatment.    Charlann Lange MD [facility]  If 7PM-7AM, please contact night-coverage   07/08/2022, 12:42 PM

## 2022-07-08 NOTE — Progress Notes (Signed)
     Connie Hoffman is a 62 y.o. female   Orthopaedic diagnosis: Right ankle deep orthopedic hardware removal Excisional debridement of skin, subcutaneous tissue, bone Repair of surgical wound dehiscence  Subjective: Patient doing well.  Pain well-controlled on current regimen.  She is eating and drinking well.  She had a bowel movement last night.  She does note a history of prediabetes but is never had any treatment.  Her A1c preoperatively 7.3.  She has had issues with wound healing in the past.  Hemoglobin 9.8 this morning.  Blood cultures pending.  Denies fever, chills, or night sweats.  White blood cell count within normal limits.  Objectyive: Vitals:   07/08/22 0021 07/08/22 0527  BP: (!) 141/78 138/70  Pulse: 97 90  Resp: 16 16  Temp: 98 F (36.7 C) 97.9 F (36.6 C)  SpO2: 100% 100%     Exam: Awake and alert Respirations even and unlabored No acute distress  Right lower extremity demonstrates clean, dry, and intact right lower extremity splint with the foot plantarflexed.  The splint was not removed.  Exposed skin is benign.  He is able to wiggle the toes.  She endorses intact sensation light touch about the toes.  She gently flexes and extends the knee.  Proximal calf soft nontender.  Exposed skin is benign.  Warm and well-perfused distally  Assessment: Postop day 1 status post the above, doing well   Plan: Patient doing well.  Blood cultures pending and CBC within normal limits today.  She was noted to have elevated hemoglobin A1c to 7.3.  She has had a diagnosis of prediabetes in the past but no formal treatment.  We will ask hospitalist team to consult for initiation of treatment for this.  She has had issues with wound healing in the past.  -Nonweightbearing right lower extremity with walker -Keep splint clean, dry, intact -Up with PT/OT today -Pain control as needed.  Plan for oxycodone outpatient. -Resume baseline Eliquis today.  This will serve for DVT  prophylaxis. -Follow cultures.  White blood cell count within normal limits will hold on any further antibiotics for now.  Clarisse Rodriges J. [place], PA-C

## 2022-07-08 NOTE — Evaluation (Signed)
Occupational Therapy Evaluation Patient Details Name: Connie Hoffman MRN: [id number] DOB: 04/23/1961 Today's Date: 07/08/2022   History of Present Illness Pt is a 62 yo female presenting for excisional debridement of re-ruptured achilles and hardware removal post achilles tendon repair several months ago and a re-rupture that occurred last week without full wound healing after I and D. PMH: Anemia, anxiety, chest pain syndrome, chronic stable angina, DDD, gastritis, headache, hematuria, hx hiatal hernia, HTN, intestinal abscess, MRSA, Pre diabetes, precordial pain, PE, renal artery aneurysm, sacroiliac pain, seizure, spinal stenosis, thyroid nodule.   Clinical Impression   PTA, pt recently mod I for home level ADL while maintaining NWB status. Assist for home management and use of wheelchair for longer distances. Pt performing LB ADL and short distance functional mobility with min guard A with RW this session. Pt demonstrating how she performs shower transfers at home. Min A up from lower surfaces. Will follow to optimize transfer technique and safety, but anticipate pt will not need follow up OT at discharge.      Recommendations for follow up therapy are one component of a multi-disciplinary discharge planning process, led by the attending physician.  Recommendations may be updated based on patient status, additional functional criteria and insurance authorization.   Follow Up Recommendations  No OT follow up     Assistance Recommended at Discharge Intermittent Supervision/Assistance  Patient can return home with the following A little help with walking and/or transfers;A little help with bathing/dressing/bathroom;Assistance with cooking/housework;Assist for transportation;Help with stairs or ramp for entrance    Functional Status Assessment  Patient has had a recent decline in their functional status and demonstrates the ability to make significant improvements in function in a  reasonable and predictable amount of time.  Equipment Recommendations  None recommended by OT (Pt has recommended equipment `)    Recommendations for Other Services       Precautions / Restrictions Precautions Precautions: Fall Restrictions Weight Bearing Restrictions: Yes RLE Weight Bearing: Non weight bearing      Mobility Bed Mobility Overal bed mobility: Modified Independent             General bed mobility comments: increased time; good maintenance of NWB precautions    Transfers Overall transfer level: Needs assistance Equipment used: Rolling walker (2 wheels) Transfers: Sit to/from Stand Sit to Stand: Min guard, Min assist           General transfer comment: min guard A for safety from elevated surfaces. Min A up from regular height toilet/chair.      Balance Overall balance assessment: Mild deficits observed, not formally tested                                         ADL either performed or assessed with clinical judgement   ADL Overall ADL's : Needs assistance/impaired Eating/Feeding: Independent;Sitting   Grooming: Set up;Sitting   Upper Body Bathing: Set up;Sitting   Lower Body Bathing: Supervison/ safety;Sitting/lateral leans   Upper Body Dressing : Set up;Sitting   Lower Body Dressing: Min guard;Sit to/from stand Lower Body Dressing Details (indicate cue type and reason): for safety. Able to don sock sitting EOB and demo how she typically performs LB dressing at start of session with first STS transfer Toilet Transfer: Min guard;Rolling walker (2 wheels);Ambulation;BSC/3in1 Armed forces technical officer Details (indicate cue type and reason): for safety Toileting- Clothing Manipulation and Hygiene: Supervision/safety;Sitting/lateral lean  Tub/ Shower Transfer: Tub transfer;Shower seat;Minimal assistance;Rolling walker (2 wheels);Ambulation Tub/Shower Transfer Details (indicate cue type and reason): MIn A up from chair to simulate  shower chair. Functional mobility during ADLs: Min guard;Rolling walker (2 wheels)       Vision Baseline Vision/History: 1 Wears glasses Ability to See in Adequate Light: 0 Adequate Patient Visual Report: No change from baseline Vision Assessment?: No apparent visual deficits     Perception     Praxis      Pertinent Vitals/Pain Pain Assessment Pain Assessment: No/denies pain     Hand Dominance     Extremity/Trunk Assessment Upper Extremity Assessment Upper Extremity Assessment: Overall WFL for tasks assessed   Lower Extremity Assessment Lower Extremity Assessment: Defer to PT evaluation       Communication Communication Communication: No difficulties   Cognition Arousal/Alertness: Awake/alert Behavior During Therapy: WFL for tasks assessed/performed Overall Cognitive Status: Within Functional Limits for tasks assessed                                 General Comments: Aware of weightbearing status, and able to maintain throughout session. Could benefit from continued education for hand placement and weight shifts during transfers, as pt observed to attempt to hop several times once upright while hoding onto RW to achieve COG over BOS     General Comments  VSS.    Exercises     Shoulder Instructions      Home Living Family/patient expects to be discharged to:: Private residence Living Arrangements: Spouse/significant other;Children (husband and daughter) Available Help at Discharge: Family;Available PRN/intermittently (may be alone 4-5 hours by herself, but sister visits during that time a lot) Type of Home: House Home Access: Stairs to enter [employer] of Steps: 1/2 step Entrance Stairs-Rails: None Home Layout: One level     Bathroom Shower/Tub: Tub/shower unit;Curtain   Bathroom Toilet: Standard (BSC over toilet)     Home Equipment: BSC/3in1;Wheelchair - manual;Cane - single point;Grab bars - toilet;Hand held shower  head;Toilet riser;Shower seat          Prior Functioning/Environment Prior Level of Function : Independent/Modified Independent;Driving             Mobility Comments: walker in home, wc for longer didstances ADLs Comments: was showering indepednently, changing bandages independently, walker for short distances.        OT Problem List: Decreased strength;Decreased activity tolerance;Decreased knowledge of use of DME or AE;Decreased knowledge of precautions;Impaired balance (sitting and/or standing)      OT Treatment/Interventions: Self-care/ADL training;Therapeutic exercise;DME and/or AE instruction;Therapeutic activities;Patient/family education;Other (comment)    OT Goals(Current goals can be found in the care plan section) Acute Rehab OT Goals Patient Stated Goal: get better OT Goal Formulation: With patient Time For Goal Achievement: 07/22/22 Potential to Achieve Goals: Good  OT Frequency: Min 2X/week    Co-evaluation              AM-PAC OT "6 Clicks" Daily Activity     Outcome Measure Help from another person eating meals?: None Help from another person taking care of personal grooming?: A Little Help from another person toileting, which includes using toliet, bedpan, or urinal?: A Little Help from another person bathing (including washing, rinsing, drying)?: A Little Help from another person to put on and taking off regular upper body clothing?: A Little Help from another person to put on and taking off regular lower body clothing?: A Little 6  Click Score: 19   End of Session Equipment Utilized During Treatment: Gait belt;Rolling walker (2 wheels) Nurse Communication: Mobility status;Precautions  Activity Tolerance: Patient tolerated treatment well Patient left: in bed;with call bell/phone within reach  OT Visit Diagnosis: Unsteadiness on feet (R26.81);Muscle weakness (generalized) (M62.81);Other abnormalities of gait and mobility (R26.89)                 Time: [id number] OT Time Calculation (min): 21 min Charges:  OT General Charges $OT Visit: 1 Visit OT Evaluation $OT Eval Low Complexity: 1 Low  Elder Cyphers, OTR/L [facility] Acute Rehabilitation Office: [phone]   Magnus Ivan 07/08/2022, 9:34 AM

## 2022-07-08 NOTE — Inpatient Diabetes Management (Addendum)
Inpatient Diabetes Program Recommendations  AACE/ADA: New Consensus Statement on Inpatient Glycemic Control (2015)  Target Ranges:  Prepandial:   less than 140 mg/dL      Peak postprandial:   less than 180 mg/dL (1-2 hours)      Critically ill patients:  140 - 180 mg/dL   Lab Results  Component Value Date   HGBA1C 7.3 (H) 07/06/2022    Review of Glycemic Control  Latest Reference Range & Units 07/08/22 01:39  Glucose 70 - 99 mg/dL 330 (H)  (H): Data is abnormally high Diabetes history: New onset DM? Outpatient Diabetes medications: none Current orders for Inpatient glycemic control: none Decadron 5 mg x1  Inpatient Diabetes Program Recommendations:    Per ADA guidelines meets criteria for diabetes diagnosis. Once notated in progress note can begin education.   In the setting of steroids, consider: -Adding Novolog 0-9 units TID & HS -Metformin 500 mg BID at discharge  Spoke with patient and husband regarding new onset DM. Patient reports that she had prediabetes, but never has been diagnosed.  Reviewed patient's current A1c of 7.3%. Explained what a A1c is and what it measures. Also reviewed goal A1c with patient, importance of good glucose control @ home, and blood sugar goals. Reviewed patho of DM, role of pancreas, survival skills, interventions, signs and symptoms of hypo vs hyper glycemia, impact of steroids to glucose trends, current trends/orders, Metformin side effects, questions to ask PCP at follow up, risk of infection with recent surgeries, and other commorbidities.  Patient will need glucose meter at discharge. Reviewed recommended frequency of CBG checks and when to call MD.  Admits to drinking sodas, sweet tea and juice. Discussed in detail plate method, counting carbohydrates, alottments for daily management, importance of protein and how to begin reading nutritional labels. Dietitian consult placed and outpatient education referral.  Patient is adamant that she is  not interested in Metformin at this time and wants to try diet and increasing activity as able. Reviewed recommended activity per ADA guidelines and stressed importance of maintain target glucose goals.  No further questions at this time.   Thanks, Bronson Curb, MSN, RNC-OB Diabetes Coordinator [phone] (8a-5p)

## 2022-07-09 DIAGNOSIS — E119 Type 2 diabetes mellitus without complications: Secondary | ICD-10-CM

## 2022-07-09 DIAGNOSIS — S86011A Strain of right Achilles tendon, initial encounter: Secondary | ICD-10-CM | POA: Diagnosis not present

## 2022-07-09 LAB — CBC
HCT: 25 % — ABNORMAL LOW (ref 36.0–46.0)
Hemoglobin: 8.1 g/dL — ABNORMAL LOW (ref 12.0–15.0)
MCH: 28.4 pg (ref 26.0–34.0)
MCHC: 32.4 g/dL (ref 30.0–36.0)
MCV: 87.7 fL (ref 80.0–100.0)
Platelets: 281 10*3/uL (ref 150–400)
RBC: 2.85 MIL/uL — ABNORMAL LOW (ref 3.87–5.11)
RDW: 13.2 % (ref 11.5–15.5)
WBC: 9.8 10*3/uL (ref 4.0–10.5)
nRBC: 0 % (ref 0.0–0.2)

## 2022-07-09 LAB — GLUCOSE, CAPILLARY
Glucose-Capillary: 205 mg/dL — ABNORMAL HIGH (ref 70–99)
Glucose-Capillary: 204 mg/dL — ABNORMAL HIGH (ref 70–99)
Glucose-Capillary: 290 mg/dL — ABNORMAL HIGH (ref 70–99)
Glucose-Capillary: 166 mg/dL — ABNORMAL HIGH (ref 70–99)
Glucose-Capillary: 167 mg/dL — ABNORMAL HIGH (ref 70–99)

## 2022-07-09 MED ORDER — BLOOD GLUCOSE TEST VI STRP: 1.0000 | ORAL_STRIP | Freq: Three times a day (TID) | 0 refills | Status: AC

## 2022-07-09 MED ORDER — LANCET DEVICE MISC: 10.0000 | Freq: Three times a day (TID) | 0 refills | Status: AC

## 2022-07-09 MED ORDER — OXYCODONE HCL 5 MG PO TABS: 5.0000 mg | ORAL_TABLET | ORAL | 0 refills | Status: DC | PRN

## 2022-07-09 MED ORDER — BLOOD GLUCOSE MONITORING SUPPL DEVI: 1.0000 | Freq: Three times a day (TID) | 0 refills | Status: AC

## 2022-07-09 NOTE — Progress Notes (Addendum)
Subjective: Patient seen and examined, denies any complaints.  CBG has been well-controlled after starting sliding scale insulin with NovoLog.  She does not want metformin as outpatient.  Wants to try Ozempic.  Vitals:   07/09/22 0518 07/09/22 0804  BP: (!) 108/54 (!) 106/58  Pulse: 77 79  Resp: 16 18  Temp: 97.9 F (36.6 C) 98 F (36.7 C)  SpO2: 98% 98%    Appears in no acute distress Morbidly obese  A/P  New onset diabetes mellitus type 2 -Hemoglobin A1c 7.3 -Patient will benefit from starting metformin however she does not want metformin -Will follow-up with PCP to discuss starting Ozempic as outpatient -Will send to patient's for glucometer, glucose test strips and lancets to her pharmacy -Okay to discharge home from medicine perspective  Mild elevation of LFTs -Likely from fatty liver in setting of morbid obesity -Discussed with patient, recheck LFTs in 2 weeks -If still elevated, consider abdominal ultrasound as outpatient  TRH service will sign off   [place]

## 2022-07-09 NOTE — Progress Notes (Signed)
Discharged instructions are given to the pt; all questions answered.

## 2022-07-09 NOTE — Discharge Summary (Signed)
Patient ID: Africa Talk MRN: [id number] DOB/AGE: 60-Jun-1962 60 y.o.  Admit date: 07/07/2022 Discharge date: 07/09/2022  Admission Diagnoses:  Principal Problem:   Right ankle pain Active Problems:   History of pulmonary embolism   Chronic anticoagulation   ABLA (acute blood loss anemia)   T2DM (type 2 diabetes mellitus) (HCC)   Hyperglycemia   History of anxiety   Hypertension   Obesity, Class III, BMI 40-49.9 (morbid obesity) ([place])   Anxiety   Pre-diabetes   Discharge Diagnoses:  Same  Past Medical History:  Diagnosis Date   Anemia    Anxiety    Chest pain syndrome    Chronic stable angina    DDD (degenerative disc disease), lumbar    Gastritis    Headache    Hematuria    resolved after removing ASA, only on Eliquis now   History of hiatal hernia    Hypertension    Intestinal abscess    MRSA infection    After knee surgery   Observed seizure-like activity (HCC)    Pre-diabetes    Precordial pain    Psoriatic arthritis ([place])    Pulmonary embolism (HCC)    Bilateral lungs   Refusal of blood transfusions as patient is Jehovah's Witness    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CC)    Sacroiliac pain    Seizure ([place])    Spinal stenosis    Thyroid nodule     Surgeries: Procedure(s): RIGHT ANKLE DEEP HARDWARE REMOVAL on 07/07/2022   Consultants:   Discharged Condition: Improved  Hospital Course: Connie Hoffman is an 62 y.o. female who was admitted 07/07/2022 for operative treatment ofRight ankle pain. Patient has severe unremitting pain that affects sleep, daily activities, and work/hobbies. After pre-op clearance the patient was taken to the operating room on 07/07/2022 and underwent  Procedure(s): RIGHT ANKLE DEEP HARDWARE REMOVAL.    Patient was given perioperative antibiotics:  Anti-infectives (From admission, onward)    Start     Dose/Rate Route Frequency Ordered Stop   07/07/22 2300  vancomycin (VANCOCIN) IVPB 1000 mg/200 mL premix        1,000 mg 200  mL/hr over 60 Minutes Intravenous Every 12 hours 07/07/22 1500 07/08/22 0011   07/07/22 1055  vancomycin (VANCOCIN) powder  Status:  Discontinued          As needed 07/07/22 1125 07/07/22 1136   07/07/22 0945  ceFAZolin (ANCEF) IVPB 3g/100 mL premix        3 g 200 mL/hr over 30 Minutes Intravenous On call to O.R. 07/07/22 0934 07/07/22 1022   07/07/22 0851  ceFAZolin (ANCEF) 2-4 GM/100ML-% IVPB       Note to Pharmacy: Alba Cory B: cabinet override      07/07/22 0851 07/07/22 2059   07/06/22 1115  ceFAZolin (ANCEF) IVPB 3g/100 mL premix  Status:  Discontinued        3 g 200 mL/hr over 30 Minutes Intravenous On call to O.R. 07/06/22 1100 07/07/22 0559        Patient was given sequential compression devices, early ambulation, and chemoprophylaxis to prevent DVT.  Patient benefited maximally from hospital stay and there were no complications.    Recent vital signs: Patient Vitals for the past 24 hrs:  BP Temp Temp src Pulse Resp SpO2  07/09/22 0804 (!) 106/58 98 F (36.7 C) Oral 79 18 98 %  07/09/22 0518 (!) 108/54 97.9 F (36.6 C) Oral 77 16 98 %  07/08/22 2035 128/62 98.2 F (36.8 C) Oral 96  16 99 %  07/08/22 1228 (!) 146/76 97.8 F (36.6 C) Oral 87 18 100 %     Recent laboratory studies:  Recent Labs    07/08/22 0139 07/09/22 0207  WBC 8.7 9.8  HGB 9.8* 8.1*  HCT 29.6* 25.0*  PLT 346 281  NA 136  --   K 4.4  --   CL 104  --   CO2 23  --   BUN 9  --   CREATININE 0.81  --   GLUCOSE 330*  --   CALCIUM 8.9  --      Discharge Medications:   Allergies as of 07/09/2022       Reactions   Aspirin Other (See Comments)   GI bleeding   Tylenol [acetaminophen] Other (See Comments)   Raised patient's liver enzymes        Medication List     STOP taking these medications    Otezla 30 MG Tabs Generic drug: Apremilast       TAKE these medications    albuterol 108 (90 Base) MCG/ACT inhaler Commonly known as: VENTOLIN HFA Inhale 2 puffs into the lungs  every 6 (six) hours as needed for wheezing.   apixaban 2.5 MG Tabs tablet Commonly known as: ELIQUIS Take 2.5 mg by mouth 2 (two) times daily.   atorvastatin 80 MG tablet Commonly known as: LIPITOR Take 80 mg by mouth daily.   Biofreeze 4 % Gel Generic drug: Menthol (Topical Analgesic) Apply 1 application  topically daily as needed (pain). Apply to painful sites daily as directed   Blink Tears 0.25 % Soln Generic drug: Polyethylene Glycol 400 Place 1 drop into both eyes 3 (three) times daily as needed (Dry eyes).   Blood Glucose Monitoring Suppl Devi 1 each by Does not apply route in the morning, at noon, and at bedtime. May substitute to any manufacturer covered by patient's insurance.   BLOOD GLUCOSE TEST STRIPS Strp 1 each by In Vitro route in the morning, at noon, and at bedtime. May substitute to any manufacturer covered by patient's insurance.   dexlansoprazole 60 MG capsule Commonly known as: DEXILANT Take 60 mg by mouth daily.   DULoxetine 30 MG capsule Commonly known as: CYMBALTA Take 30 mg by mouth at bedtime.   gabapentin 300 MG capsule Commonly known as: NEURONTIN Take 1 capsule (300 mg total) by mouth 3 (three) times daily.   ICY HOT EX Apply 1 Application topically daily as needed (pain).   isosorbide mononitrate 30 MG 24 hr tablet Commonly known as: IMDUR Take 30 mg by mouth 2 (two) times daily.   Lancet Device Misc 10 each by Does not apply route in the morning, at noon, and at bedtime. May substitute to any manufacturer covered by patient's insurance.   metoprolol succinate 50 MG 24 hr tablet Commonly known as: TOPROL-XL Take 50 mg by mouth daily.   nitroGLYCERIN 0.4 MG SL tablet Commonly known as: NITROSTAT Place 0.4 mg under the tongue every 5 (five) minutes as needed for chest pain.   NONFORMULARY OR COMPOUNDED ITEM Apply 1 Application topically daily as needed (rash, itching). Triamcinolone compounded with unknown   oxyCODONE 5 MG  immediate release tablet Commonly known as: Roxicodone Take 1 tablet (5 mg total) by mouth every 4 (four) hours as needed for severe pain.               Discharge Care Instructions  (From admission, onward)           Start  Ordered   07/09/22 0000  Non weight bearing       Question Answer Comment  Laterality right   Extremity Lower      07/09/22 1202            Diagnostic Studies: DG MINI C-ARM IMAGE ONLY  Result Date: 07/07/2022 There is no interpretation for this exam.  This order is for images obtained during a surgical procedure.  Please See "Surgeries" Tab for more information regarding the procedure.    Disposition: Discharge disposition: 01-Home or Self Care      Plan: Patient doing well and suitable for discharge home today.  She has helpful family members at home.    -Nonweightbearing right lower extremity with walker -Keep splint clean, dry, intact -Oxycodone sent to outpatient pharmacy for pain control. -He has resumed her baseline Eliquis.  This will also serve for DVT prophylaxis.   She will follow-up with her PCP to discuss management of type 2 diabetes.  We did discuss her hemoglobin today/ She feel well and she is comfortable monitoring for symptoms at home.    Plan for outpatient follow-up with Dr. Lucia Gaskins 2 weeks from surgery for reevaluation out of the splint    Discharge Instructions     Ambulatory referral to Nutrition and Diabetic Education   Complete by: As directed    Call MD / Call 911   Complete by: As directed    If you experience chest pain or shortness of breath, CALL 911 and be transported to the hospital emergency room.  If you develope a fever above 101 F, pus (white drainage) or increased drainage or redness at the wound, or calf pain, call your surgeon's office.   Constipation Prevention   Complete by: As directed    Drink plenty of fluids.  Prune juice may be helpful.  You may use a stool softener, such as Colace  (over the counter) 100 mg twice a day.  Use MiraLax (over the counter) for constipation as needed.   Diet - low sodium heart healthy   Complete by: As directed    Non weight bearing   Complete by: As directed    Laterality: right   Extremity: Lower   Post-operative opioid taper instructions:   Complete by: As directed    POST-OPERATIVE OPIOID TAPER INSTRUCTIONS: It is important to wean off of your opioid medication as soon as possible. If you do not need pain medication after your surgery it is ok to stop day one. Opioids include: Codeine, Hydrocodone(Norco, Vicodin), Oxycodone(Percocet, oxycontin) and hydromorphone amongst others.  Long term and even short term use of opiods can cause: Increased pain response Dependence Constipation Depression Respiratory depression And more.  Withdrawal symptoms can include Flu like symptoms Nausea, vomiting And more Techniques to manage these symptoms Hydrate well Eat regular healthy meals Stay active Use relaxation techniques(deep breathing, meditating, yoga) Do Not substitute Alcohol to help with tapering If you have been on opioids for less than two weeks and do not have pain than it is ok to stop all together.  Plan to wean off of opioids This plan should start within one week post op of your joint replacement. Maintain the same interval or time between taking each dose and first decrease the dose.  Cut the total daily intake of opioids by one tablet each day Next start to increase the time between doses. The last dose that should be eliminated is the evening dose.  Follow-up Information     Erle Crocker, MD Follow up in 2 week(s).   Specialty: Orthopedic Surgery Contact information: [place] [address] [phone]                  Signed: Anela Bensman J Martinique 07/09/2022, 12:06 PM

## 2022-07-09 NOTE — Care Management Obs Status (Signed)
[place] NOTIFICATION   Patient Details  Name: Connie Hoffman MRN: [id number] Date of Birth: 16-May-1960   Medicare Observation Status Notification Given:       Sharin Mons, RN 07/09/2022, 10:02 AM

## 2022-07-09 NOTE — TOC Initial Note (Signed)
Transition of Care [facility]) - Initial/Assessment Note    Patient Details  Name: Connie Hoffman MRN: [id number] Date of Birth: 1961-02-27  [facility] CM/SW Contact:    Sharin Mons, RN Phone Number: 07/09/2022, 9:53 AM  Clinical Narrative:                     s/p R ankle hardware removal, 3/12 From home with husband. PTA independent with ADL's. DME: RW, W/C, pt already has. Per PT evaluation: Outpatient PT (once pt is WB). Currently per provider pt is non-weight bearing, noted 3/13.   Pt without equipment needs. States without RX med concerns. Husband to provide transportation to home once d/c ready.  TOC team following and will assist with needs....     Expected Discharge Plan: Home/Self Care Barriers to Discharge: Continued Medical Work up   Patient Goals and CMS Choice            Expected Discharge Plan and Services       Living arrangements for the past 2 months: Single Family Home                                      Prior Living Arrangements/Services Living arrangements for the past 2 months: Single Family Home Lives with:: Spouse Patient language and need for interpreter reviewed:: Yes Do you feel safe going back to the place where you live?: Yes      Need for Family Participation in Patient Care: Yes (Comment) Care giver support system in place?: Yes (comment) Current home services: DME Criminal Activity/Legal Involvement Pertinent to Current Situation/Hospitalization: No - Comment as needed  Activities of Daily Living Home Assistive Devices/Equipment: None, Dentures (specify type), Shower chair with back, Wheelchair, Eyeglasses, Blood pressure cuff, Walker (specify type), CBG Meter, Hand-held shower hose, Bedside commode/3-in-1 (dentures left at home) ADL Screening (condition at time of admission) Patient's cognitive ability adequate to safely complete daily activities?: Yes Is the patient deaf or have difficulty hearing?:  No Does the patient have difficulty seeing, even when wearing glasses/contacts?: No Does the patient have difficulty concentrating, remembering, or making decisions?: No Patient able to express need for assistance with ADLs?: Yes Does the patient have difficulty dressing or bathing?: No Independently performs ADLs?: Yes (appropriate for developmental age) Does the patient have difficulty walking or climbing stairs?: Yes Weakness of Legs: Both Weakness of Arms/Hands: None  Permission Sought/Granted   Permission granted to share information with : Yes, Verbal Permission Granted  Share Information with NAME: Anjolina Valenti  Spouse  [phone]           Emotional Assessment Appearance:: Appears stated age Attitude/Demeanor/Rapport: Engaged Affect (typically observed): Accepting Orientation: : Oriented to Self, Oriented to Place, Oriented to  Time, Oriented to Situation Alcohol / Substance Use: Not Applicable Psych Involvement: No (comment)  Admission diagnosis:  Right ankle pain [M25.571] Patient Active Problem List   Diagnosis Date Noted   ABLA (acute blood loss anemia) 07/08/2022   T2DM (type 2 diabetes mellitus) ([place]) 07/08/2022   Hyperglycemia 07/08/2022   History of anxiety 07/08/2022   Hypertension 07/08/2022   Obesity, Class III, BMI 40-49.9 (morbid obesity) ([place]) 07/08/2022   Anxiety 07/08/2022   Pre-diabetes 07/08/2022   Right ankle pain 07/07/2022   Open wound of right foot 06/03/2022   Achilles tendonosis of right lower extremity 03/26/2022   Seizure ([place]) 11/03/2021   Diverticulitis of  large intestine with abscess without bleeding 05/28/2021   History of pulmonary embolism 05/28/2021   Chronic anticoagulation 05/28/2021   Midline cystocele 05/23/2020   Pelvic mass 12/04/2019   DDD (degenerative disc disease), lumbar 01/21/2018   Primary osteoarthritis of both hands 01/21/2018   Primary osteoarthritis of both feet 01/21/2018   Psoriasis 12/01/2017   Psoriatic  arthritis ([place]) 12/01/2017   High risk medication use 12/01/2017   S/P total knee replacement, 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   Thrombocytosis 12/01/2017   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   PCP:  Jenel Lucks, PA-C Pharmacy:   [facility], [place] [address] Phone: [phone] Fax: [phone]     Social Determinants of Health (SDOH) Social History: SDOH Screenings   Food Insecurity: No Food Insecurity (12/04/2019)  Transportation Needs: No Transportation Needs (12/04/2019)  Depression (PHQ2-9): Low Risk  (05/22/2020)  Tobacco Use: Low Risk  (07/08/2022)   SDOH Interventions:     Readmission Risk Interventions     No data to display

## 2022-07-09 NOTE — Progress Notes (Signed)
Nutrition Brief Note  Consult received for new onset DM diet education.   "Plate Method" and "Carbohydrate Counting for People with Diabetes" handout added to AVS. MD placed referral for ongoing outpatient diet education.  Clayborne Dana, RDN, LDN Clinical Nutrition

## 2022-07-09 NOTE — Progress Notes (Addendum)
     Connie Hoffman is a 62 y.o. female   Orthopaedic diagnosis: Right ankle deep orthopedic hardware removal Excisional debridement of skin, subcutaneous tissue, bone Repair of surgical wound dehiscence  Subjective: Patient doing well.  She reports pain is well-controlled.  Hemoglobin dropped to 8.1 overnight but she feels well.  She is hopeful for discharge home today.  She understands her new diagnosis of type 2 diabetes and has a plan for this.  She plans to meet with her PCP outpatient to discuss medication management.  Her blood cultures have remained negative.  White blood cell count remains within normal limits.  She denies fever, shortness of breath, or chest pain.  Husband is present in the room.  Objectyive: Vitals:   07/09/22 0518 07/09/22 0804  BP: (!) 108/54 (!) 106/58  Pulse: 77 79  Resp: 16 18  Temp: 97.9 F (36.6 C) 98 F (36.7 C)  SpO2: 98% 98%     Exam: Awake and alert Respirations even and unlabored No acute distress  Right lower extremity demonstrates clean, dry, and intact right lower extremity splint with the foot plantarflexed.  The splint was not removed.  Exposed skin is benign.  He is able to wiggle the toes.  She endorses intact sensation light touch about the toes.  She gently flexes and extends the knee.  Proximal calf soft nontender.  Exposed skin is benign.  Warm and well-perfused distally   Assessment: Postop day 2 status post the above, doing well     Plan: Patient doing well and suitable for discharge home today.  She has helpful family members at home.   -Nonweightbearing right lower extremity with walker -Keep splint clean, dry, intact -Oxycodone sent to outpatient pharmacy for pain control. -He has resumed her baseline Eliquis.  This will also serve for DVT prophylaxis.  She will follow-up with her PCP to discuss management of type 2 diabetes.  We did discuss her hemoglobin today/ She feel well and she is comfortable monitoring for  symptoms at home.   Plan for outpatient follow-up with Dr. Lucia Gaskins 2 weeks from surgery for reevaluation out of the splint.     Makai Agostinelli J. [place], PA-C

## 2022-07-10 NOTE — Op Note (Addendum)
Connie Hoffman female 62 y.o. 07/07/2022  PreOperative Diagnosis: Right Achilles tendon rerupture with infection Retained orthopedic hardware, right calcaneus Right posterior Achilles wound dehiscence  PostOperative Diagnosis: same  PROCEDURE: Excisional  debridement of Achilles tendon Deep orthopedic hardware removal, right calcaneus Treatment of wound dehiscence with closure  SURGEON: Melony Overly, MD  ASSISTANT: Jesse Martinique, PA-C was lesser patient positioning, prep, drape, assistance with surgery and closure.  ANESTHESIA: General anesthesia  FINDINGS: Wound dehiscence at the interface of the surgical wound and the traumatic rerupture wound with exposed tendon. Severe tendinosis of the distal aspect of the Achilles tendon with evidence of infection Retained deep orthopedic hardware in the calcaneus  IMPLANTS: none  INDICATIONS:61 y.o. femaleunderwent Achilles tendon reconstruction with Haglund's excision previously and did well postoperatively until she had an accidental traumatic open rerupture of her tendon.  This resulted in a transverse laceration through her previously healed surgical incision.  She was seen in the emergency department initially where irrigation was performed and primary closure of the wound.  I then saw her and we urgently took her back to the OR for repeat washout and wound closure with repair of the tendon.  Unfortunately she did not fully heal her wound and had some dehiscence at the inner face of the previous surgical incision and the traumatic wound and had exposed tendon.  She was sent to plastic surgery for potential coverage and she was given some topical treatments.  Unfortunately she developed an infection due to the open wound and improved significantly with antibiotic therapy however had continued exposed tendon and persistent wound dehiscence and therefore was indicated for repeat debridement of the tendon with removal of hardware and  treatment of the dehisced wound to close it and allow for success of antibiotic treatment.   Patient understood the risks, benefits and alternatives to surgery which include but are not limited to wound healing complications, infection, nonunion, malunion, need for further surgery as well as damage to surrounding structures. They also understood the potential for continued pain in that there were no guarantees of acceptable outcome After weighing these risks the patient opted to proceed with surgery.  PROCEDURE: Patient was identified in the preoperative holding area.  The right ankle was marked by myself.  Consent was signed by myself and the patient.  She was taken to the operative suite and the tourniquet was placed on the operative thigh.  General anesthesia was induced without difficult.  She was placed prone with chest rolls and well-padded bony prominences.  We began by inspecting the area of wound dehiscence.  There is exposed Achilles tendon.  No overlying peritenon tissue.  Then the incision was extended.  We made an incision through the previously made surgical incision through the area of wound dehiscence to expose the tendon.  There was evidence of infection along the course of the Achilles tendon.  The skin flaps were created as well as sub-peritenon type flaps were created.  We then turned our attention to the tendon.  Using a deep 15 blade the tendon material that appeared infected and involved with the rerupture was transected.  A portion of the tendon was debrided a proximally 5 cm from the proximal aspect down to the calcaneus.  It was elevated off of the calcaneus at the site of previous reconstruction.  This area of tendon was completely removed and discarded.we also sent some of the tendon material and surrounding scarring and bony material to the microbiology lab for culture.  We then proceeded  to continue to elevate the soft tissues and to allow for exposure of the calcaneus.  The  suture anchors that were previously placed within the calcaneus were addressed.  We were able to remove the deep orthopedic implants using the inserter mechanism as well as with a rongeur to remove the 5 suture anchors that were placed.  She had had a previous flexor digitorum longus tendon transfer that was scarred to the deep posterior fascia.  The anchors were removed.  They were discarded.  We then performed copious irrigation of the entire posterior aspect of the open wound and area of dehiscence.  Then proceeded to address the area of wound dehiscence.  Using a 15 blade the area of nonviable skin, peritenon and subcutaneous tissue was removed.  We then placed 500 cc of vancomycin powder within the wound.  The skin was then closed in a tension-free manner with the ankle in a plantarflexed position.  Were able to close the area of wound dehiscence without tension and with good skin approximation.  Soft dressing and a short leg splint was then placed.   POST OPERATIVE INSTRUCTIONS: Nonweightbearing to operative extremity until skin heals She will be admitted to the hospital to await culture material results. She will follow-up in 2 weeks after discharge for inspection of the skin.  Once the skin is healed we will allow her to weight-bear in a boot.  TOURNIQUET TIME:less than one hour  BLOOD LOSS:  less than 50 mL         DRAINS: none         SPECIMEN: none       COMPLICATIONS:  * No complications entered in OR log *         Disposition: PACU - hemodynamically stable.         Condition: stable

## 2022-07-12 LAB — AEROBIC/ANAEROBIC CULTURE W GRAM STAIN (SURGICAL/DEEP WOUND): Gram Stain: NONE SEEN

## 2022-07-16 ENCOUNTER — Other Ambulatory Visit (HOSPITAL_COMMUNITY): Payer: Self-pay

## 2022-07-20 ENCOUNTER — Encounter: Payer: Self-pay | Admitting: Neurology

## 2022-07-20 ENCOUNTER — Ambulatory Visit: Payer: 59 | Admitting: Neurology

## 2022-08-06 ENCOUNTER — Other Ambulatory Visit (HOSPITAL_COMMUNITY): Payer: Self-pay

## 2022-08-10 ENCOUNTER — Other Ambulatory Visit (HOSPITAL_COMMUNITY): Payer: Self-pay

## 2022-08-12 ENCOUNTER — Encounter: Payer: Self-pay | Admitting: Neurology

## 2022-08-12 ENCOUNTER — Ambulatory Visit (INDEPENDENT_AMBULATORY_CARE_PROVIDER_SITE_OTHER): Payer: 59 | Admitting: Neurology

## 2022-08-12 VITALS — BP 151/75 | HR 94 | Ht 65.0 in | Wt 270.0 lb

## 2022-08-12 DIAGNOSIS — G43109 Migraine with aura, not intractable, without status migrainosus: Secondary | ICD-10-CM

## 2022-08-12 DIAGNOSIS — G5623 Lesion of ulnar nerve, bilateral upper limbs: Secondary | ICD-10-CM

## 2022-08-12 MED ORDER — GABAPENTIN 300 MG PO CAPS: 300.0000 mg | ORAL_CAPSULE | Freq: Three times a day (TID) | ORAL | 3 refills | Status: DC

## 2022-08-12 NOTE — Patient Instructions (Signed)
Restart gabapentin  three times daily  I will see you back in 1 year

## 2022-08-12 NOTE — Progress Notes (Signed)
Follow-up Visit   Date: 08/12/22   Connie Hoffman MRN: [id number] DOB: [birth year]   Interim History: Connie Hoffman is a 62 y.o. 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  The patient was accompanied to the clinic by self.  IMPRESSION/PLAN: Atypical migraine, worse since being off gabapentin - Restart gabapentin  three times daily - Limit all OTC medications to twice per week  2.  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  Improved.   Return to clinic in 1 year  ---------------------------------------------------------- History of present illness: Starting early 2021, she began noticing numbness in both hands, with occasional tingling. Symptoms are noticeable when she wakes up or when she is resting.  She endorses weakness and difficulty opening jars/bottle.     She has tingling and cramping of the legs for the past 1-2 years and takes gabapentin  twice daily. She had tingling up to her knees, which is present at nighttime.  She was diagnosed with neuropathy in 2018 by [facility] while living in [place].  This was ordered by her pain management provider, who she was seeing for lumbar canal stenosis. She underwent lumbar surgery which has significantly helped her low back pain and tingling in her legs.  She continues to get muscle cramps at night time. She takes methocarbamol and tramadol for pain.     She takes methotrexate and Cosentyx for psoriatic arthritis.     UPDATE 09/19/2020:  Over the past 6 months, she has developed piercing headaches, lasting any where from 10-45 minutes, which occurs daily. It is usually at the base of her head and behind the right ear.   It is worse with movement and in the evening.  She prefers quiet, dark room.  She has some relief with ice application to her neck.  She endorses  photophobia, phonophobia, and mild dizziness.  She does not treat the headaches.  Headache does not wake her up from sleeping.  No worsening with coughing/sneezing.  No prior history of headaches.   The numbness and tingling in the hands is unchanged.  She has not started occupational therapy.   UPDATE 06/06/2021: He is here for follow-up visit.  At her last visit, she reported worsening headaches so MRI brain was obtained which showed partially empty sella.  Given her associated headaches, she underwent large-volume lumbar puncture which showed normal opening pressure, excluding pseudotumor cerebri.  Overall, her headaches have improved and rarely gets migraines after increasing gabapentin to  three times.   Her bigger concern is ongoing paresthesias of her hands, worse on the right. It wakes her up from sleeping eventhough she is mindful of hand and arm positioning. She does not report weakness.   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  She was also hospitalized in January 2023 with diverticulitis of the large intestines which was treated with antibiotic therapy.  UPDATE 08/12/2022:  She underwent bilateral ulnar nerve decompression in the summer of 2023 and reports having marked improvement of bilateral hand paresthesias.  She no longer has numbness/tingling of the hands.  Since the end of March, she reports having increased headaches and takes up to 4 tylenol daily.  She has pressure in the forehead. She has been off gabapentin since March.    The past several months have been very difficult because she underwent  right Achilles tendon reconstruction and suffered a fall rupturing the tendon after the cast was removed, which prompted her to go back to the OR.  She has the foot immobilized in a CAM boot.     Medications:  Current Outpatient Medications on File Prior to Visit  Medication Sig Dispense Refill   albuterol (VENTOLIN  HFA) 108 (90 Base) MCG/ACT inhaler Inhale 2 puffs into the lungs every 6 (six) hours as needed for wheezing.     apixaban (ELIQUIS) 2.5 MG TABS tablet Take 2.5 mg by mouth 2 (two) times daily.     atorvastatin (LIPITOR) 80 MG tablet Take 80 mg by mouth daily.     Blood Glucose Monitoring Suppl DEVI 1 each by Does not apply route in the morning, at noon, and at bedtime. May substitute to any manufacturer covered by patient's insurance. 1 each 0   dexlansoprazole (DEXILANT) 60 MG capsule Take 60 mg by mouth daily.     DULoxetine (CYMBALTA) 30 MG capsule Take 30 mg by mouth at bedtime.     isosorbide mononitrate (IMDUR) 30 MG 24 hr tablet Take 30 mg by mouth 2 (two) times daily.     Menthol, Topical Analgesic, (BIOFREEZE) 4 % GEL Apply 1 application  topically daily as needed (pain). Apply to painful sites daily as directed     Menthol, Topical Analgesic, (ICY HOT EX) Apply 1 Application topically daily as needed (pain).     metoprolol succinate (TOPROL-XL) 50 MG 24 hr tablet Take 50 mg by mouth daily.     nitroGLYCERIN (NITROSTAT) 0.4 MG SL tablet Place 0.4 mg under the tongue every 5 (five) minutes as needed for chest pain.     NONFORMULARY OR COMPOUNDED ITEM Apply 1 Application topically daily as needed (rash, itching). Triamcinolone compounded with unknown     oxyCODONE (ROXICODONE) 5 MG immediate release tablet Take 1 tablet (5 mg total) by mouth every 4 (four) hours as needed for severe pain. 30 tablet 0   Polyethylene Glycol 400 (BLINK TEARS) 0.25 % SOLN Place 1 drop into both eyes 3 (three) times daily as needed (Dry eyes).     No current facility-administered medications on file prior to visit.    Allergies:  Allergies  Allergen Reactions   Aspirin Other (See Comments)    GI bleeding   Tylenol [Acetaminophen] Other (See Comments)    Raised patient's liver enzymes    Vital Signs:  BP (!) 151/75 (BP Location: Right Arm)   Pulse 94   Ht 5\' 5"  (1.651 m)   Wt 270 lb (122.5 kg)   SpO2  91%   BMI 44.93 kg/m    Neurological Exam: MENTAL STATUS including orientation to time, place, person, recent and remote memory, attention span and concentration, language, and fund of knowledge is normal.  Speech is not dysarthric.  CRANIAL NERVES:  Normal conjugate, extra-ocular eye movements in all directions of gaze.  No ptosis. Face is symmetric. Tongue is midline.   MOTOR:  Motor strength is 5/5 in all extremities, including bilateral finger abductors (improved).  Examination of the right leg was limited due to foot being immobilized.  No atrophy, fasciculations or abnormal movements.  No pronator drift.  Tone is normal.    COORDINATION/GAIT:  Gait not tested, patient in wheelchair today.   Data: [facility]/EMG of the arms 05/08/2020: Bilateral ulnar neuropathy with slowing across the elbow, predominantly demyelinating, mild-to-moderate and worse on the right. There is no evidence of a cervical radiculopathy or carpal tunnel syndrome affecting  either upper extremity  MRI brain wo contrast 02/20/2021: Partially empty and slightly expanded sella turcica. While these findings can reflect incidental anatomic variation, they can also be associated with idiopathic intracranial hypertension (pseudotumor cerebri). Mild generalized cerebellar atrophy. Otherwise unremarkable non-contrast MRI appearance of the brain for age. Mild mucosal thickening within the left maxillary sinus.  Large volume LP 03/04/2021: OP 15   Thank you for allowing me to participate in patient's care.  If I can answer any additional questions, I would be pleased to do so.    Sincerely,    Contrina Orona K. Allena Katz, DO

## 2022-08-27 ENCOUNTER — Other Ambulatory Visit: Payer: Self-pay | Admitting: Physician Assistant

## 2022-08-27 ENCOUNTER — Other Ambulatory Visit (HOSPITAL_COMMUNITY): Payer: Self-pay

## 2022-08-27 ENCOUNTER — Other Ambulatory Visit: Payer: Self-pay

## 2022-08-27 DIAGNOSIS — L4050 Arthropathic psoriasis, unspecified: Secondary | ICD-10-CM

## 2022-08-27 DIAGNOSIS — L409 Psoriasis, unspecified: Secondary | ICD-10-CM

## 2022-08-27 MED ORDER — OTEZLA 30 MG PO TABS
30.0000 mg | ORAL_TABLET | Freq: Two times a day (BID) | ORAL | 2 refills | Status: DC
  Filled 2022-08-27: qty 60, 30d supply, fill #0
  Filled 2022-09-29: qty 60, 30d supply, fill #1

## 2022-08-27 NOTE — Telephone Encounter (Signed)
Last Fill: 06/22/2022  Labs: 07/09/2022 RBC 2.85, Hgb 8.1, Hct 25.0, 07/08/2022 Glucose 330, Albumin 3.0, AST 50, ALT 51  Next Visit: 09/10/2022  Last Visit: 02/13/2022  DX: Psoriatic arthritis   Current Dose per office note 02/13/2022: Otezla 30 mg 1 tablet by mouth twice daily   Okay to refill [facility]?

## 2022-09-01 NOTE — Progress Notes (Unsigned)
Office Visit Note  Patient: Connie Hoffman             Date of Birth: December 06, 1960           MRN: [id number]             PCP: Kerin Salen, PA-C Referring: Kathaleen Bury* Visit Date: 09/10/2022 Occupation: @GUAROCC @  Subjective:  Discuss otezla/treatment options   History of Present Illness: [name] is a 62 y.o. female with history of psoriatic arthritis.  Patient was last seen in the office on 02/13/2022 at which time she was taking Otezla 30 mg 1 tablet by mouth twice daily.  The patient underwent right Achilles tendon reconstruction and debridement by Dr. Susa Simmonds on 03/26/2022.  According to the patient she had been off of Otezla from November 2023 until early May 2024.  She states that the initial surgery was successful but unfortunately after she was cleared to stop wearing her boot she had a fall the following day.  The fall led her to have a second surgery leading to a nonhealing incision.  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  She had to have surgery for hardware removal on 07/07/22 by Dr. Susa Simmonds.  According to the patient she does not currently have any open wound or infection.  She is continuing to use a walking boot and a walker to assist with ambulation.  She is going to therapy as advised. Patient states that she resumed Otezla 30 mg 1 tablet twice daily 2 to 3 weeks ago.  She has been experiencing significant diarrhea since resuming Otezla as prescribed.  She states that she has had intermittent flares in her joints including increased joint stiffness and arthralgias especially with changes in weather.  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   Activities of Daily Living:  Patient reports morning stiffness for 45-60 minutes.   Patient Reports nocturnal pain.  Difficulty dressing/grooming: Denies Difficulty climbing stairs: Reports Difficulty getting out of chair:  Reports Difficulty using hands for taps, buttons, cutlery, and/or writing: Reports  Review of Systems  Constitutional:  Positive for fatigue.  HENT:  Negative for mouth sores and mouth dryness.   Eyes:  Positive for dryness.  Respiratory:  Positive for shortness of breath.   Cardiovascular:  Positive for chest pain. Negative for palpitations.  Gastrointestinal:  Positive for diarrhea and nausea. Negative for blood in stool and constipation.  Endocrine: Negative for increased urination.  Genitourinary:  Negative for involuntary urination.  Musculoskeletal:  Positive for joint pain, gait problem, joint pain, myalgias, muscle weakness, morning stiffness, muscle tenderness and myalgias. Negative for joint swelling.  Skin:  Negative for color change, rash, hair loss and sensitivity to sunlight.  Allergic/Immunologic: Positive for susceptible to infections.  Neurological:  Positive for dizziness and headaches.  Hematological:  Negative for swollen glands.  Psychiatric/Behavioral:  Positive for sleep disturbance. Negative for depressed mood. The patient is not nervous/anxious.     PMFS History:  Patient Active Problem List   Diagnosis Date Noted   ABLA (acute blood loss anemia) 07/08/2022   T2DM (type 2 diabetes mellitus) (HCC) 07/08/2022   Hyperglycemia 07/08/2022   History of anxiety 07/08/2022   Hypertension 07/08/2022   Obesity, Class III, BMI 40-49.9 (morbid obesity) (HCC) 07/08/2022   Anxiety 07/08/2022   Pre-diabetes 07/08/2022   Right ankle pain 07/07/2022   Open wound of right foot 06/03/2022   Achilles tendonosis of right lower extremity  03/26/2022   Seizure (HCC) 11/03/2021   Diverticulitis of large intestine with abscess without bleeding 05/28/2021   History of pulmonary embolism 05/28/2021   Chronic anticoagulation 05/28/2021   Midline cystocele 05/23/2020   Pelvic mass 12/04/2019   DDD (degenerative disc disease), lumbar 01/21/2018   Primary osteoarthritis of both  hands 01/21/2018   Primary osteoarthritis of both feet 01/21/2018   Psoriasis 12/01/2017   Psoriatic arthritis (HCC) 12/01/2017   High risk medication use 12/01/2017   S/P total knee replacement, 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   Thrombocytosis 12/01/2017   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    Past Medical History:  Diagnosis Date   Anemia    Anxiety    Chest pain syndrome    Chronic stable angina    DDD (degenerative disc disease), lumbar    Gastritis    Headache    Hematuria    resolved after removing ASA, only on Eliquis now   History of hiatal hernia    Hypertension    Intestinal abscess    MRSA infection    After knee surgery   Observed seizure-like activity (HCC)    Pre-diabetes    Precordial pain    Psoriatic arthritis (HCC)    Pulmonary embolism (HCC)    Bilateral lungs   Refusal of blood transfusions as patient is Jehovah's Witness    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CC)    Sacroiliac pain    Seizure (HCC)    Spinal stenosis    Thyroid nodule     Family History  Problem Relation Age of Onset   Diabetes Mother    Hypertension Mother    Heart Problems Mother    Congestive Heart Failure Mother    Diabetes Father    Hypertension Father    Psoriasis Father    Stroke Sister    Heart Problems Sister    Seizures Sister    Prostate cancer Brother    Diabetes Brother    Asthma Daughter    Healthy Daughter    Healthy Son    Healthy Son    Past Surgical History:  Procedure Laterality Date   ABDOMINAL HYSTERECTOMY     ARTHRODESIS FOOT WITH WEIL OSTEOTOMY Right 03/26/2022   Procedure: GASTROCNEMIUS RECESSION, CALCANEAL EXOSTECTOMY;  Surgeon: Terance Hart, MD;  Location: [facility] OR;  Service: Orthopedics;  Laterality: Right;   BACK SURGERY  05/18/2019   L4 and L5   CARDIAC CATHETERIZATION     CHOLECYSTECTOMY     CYSTECTOMY  07/2017   FOOT SURGERY Right 05/08/2022   JOINT REPLACEMENT Left    left knee   LAPAROSCOPIC ENDOMETRIOSIS FULGURATION  2011   LAPAROSCOPIC GASTRIC  SLEEVE RESECTION  2018   LAPAROSCOPIC OOPHERECTOMY Right    LUMBAR EPIDURAL INJECTION  04/13/2018   MINOR HARDWARE REMOVAL Right 07/07/2022   Procedure: RIGHT ANKLE DEEP HARDWARE REMOVAL;  Surgeon: Terance Hart, MD;  Location: [facility] OR;  Service: Orthopedics;  Laterality: Right;   REVISION TOTAL KNEE ARTHROPLASTY Left 12/2017   TENOLYSIS Right 03/26/2022   Procedure: RIGHT ACHILLES TENDON RECONSTRUCTION AND DEBRIDEMENT;  Surgeon: Terance Hart, MD;  Location: [facility] OR;  Service: Orthopedics;  Laterality: Right;  LENGTH OF SURGERY: 120 MINUTES   TOTAL SHOULDER ARTHROPLASTY Bilateral    TRIGGER FINGER RELEASE Bilateral 2023   bilateral 3rd digit   TUBAL LIGATION     postpartum   ULNAR NERVE REPAIR Bilateral 2023   Social History   Social History Narrative   Right handed    Lives with husband  and 1 daughters    Immunization History  Administered Date(s) Administered   Influenza,inj,Quad PF,6+ Mos 04/15/2018   PFIZER(Purple Top)SARS-COV-2 Vaccination 07/15/2019, 08/05/2019, 03/13/2020   Pneumococcal Polysaccharide-23 04/15/2018   Zoster Recombinat (Shingrix) 05/13/2018, 10/25/2019     Objective: Vital Signs: BP 134/80 (BP Location: Left Wrist, Patient Position: Sitting, Cuff Size: Normal)   Pulse 78   Ht 5\' 5"  (1.651 m)   Wt 286 lb 9.6 oz (130 kg)   BMI 47.69 kg/m    Physical Exam Vitals and nursing note reviewed.  Constitutional:      Appearance: She is well-developed.  HENT:     Head: Normocephalic and atraumatic.  Eyes:     Conjunctiva/sclera: Conjunctivae normal.  Cardiovascular:     Rate and Rhythm: Normal rate and regular rhythm.     Heart sounds: Normal heart sounds.  Pulmonary:     Effort: Pulmonary effort is normal.     Breath sounds: Normal breath sounds.  Abdominal:     General: Bowel sounds are normal.     Palpations: Abdomen is soft.  Musculoskeletal:     Cervical back: Normal range of motion.  Lymphadenopathy:     Cervical: No cervical  adenopathy.  Skin:    General: Skin is warm and dry.     Capillary Refill: Capillary refill takes less than 2 seconds.  Neurological:     Mental Status: She is alert and oriented to person, place, and time.  Psychiatric:        Behavior: Behavior normal.      Musculoskeletal Exam: Patient remained seated during the examination today.  C-spine has good range of motion.  Some trapezius muscle tension and tenderness bilaterally.  Shoulder joints, elbow joints, wrist joints, MCPs, PIPs, DIPs have good range of motion with no synovitis.  Complete fist formation bilaterally.  Left knee replacement has good range of motion with warmth but no effusion.  Right knee joint has good range of motion with no warmth or effusion.  Right foot is in a walking boot.  Left ankle joint has good range of motion with no joint tenderness.  CDAI Exam: CDAI Score: -- Patient Global: --; Provider Global: -- Swollen: --; Tender: -- Joint Exam 09/10/2022   No joint exam has been documented for this visit   There is currently no information documented on the homunculus. Go to the Rheumatology activity and complete the homunculus joint exam.  Investigation: No additional findings.  Imaging: No results found.  Recent Labs: Lab Results  Component Value Date   WBC 9.8 07/09/2022   HGB 8.1 (L) 07/09/2022   PLT 281 07/09/2022   NA 136 07/08/2022   K 4.4 07/08/2022   CL 104 07/08/2022   CO2 23 07/08/2022   GLUCOSE 330 (H) 07/08/2022   BUN 9 07/08/2022   CREATININE 0.81 07/08/2022   BILITOT 0.4 07/08/2022   ALKPHOS 102 07/08/2022   AST 50 (H) 07/08/2022   ALT 51 (H) 07/08/2022   PROT 6.5 07/08/2022   ALBUMIN 3.0 (L) 07/08/2022   CALCIUM 8.9 07/08/2022   GFRAA 103 10/08/2020   QFTBGOLDPLUS NEGATIVE 05/01/2020    Speciality Comments: Cosentyx start date:04/2018  Procedures:  No procedures performed Allergies: Aspirin and Tylenol [acetaminophen]   Assessment / Plan:     Visit Diagnoses: Psoriatic  arthritis (HCC): No synovitis or dactylitis was noted on examination today.  She continues to experience intermittent arthralgias and joint stiffness especially with weather changes.  She recently had a flare of inverse psoriasis and  was prescribed triamcinolone cream to apply topically as needed by her dermatologist.  Patient recently had a gap and Otezla from November 2023 until early May 2024.  She initially underwent right Achilles tendon reconstruction and debridement by Dr. Susa Simmonds on 03/26/2022 which was successful.  The patient had a fall during the postoperative period requiring a revision.  She ended up with a nonhealing incision and was later found to have MRSA.  She ended up having to undergo hardware removal on 07/07/2022.  She remains in a walking boot and is using a walker to assist with ambulation.  She remains under the close care of Dr. Susa Simmonds and is undergoing PT. Discussed the ongoing risk for immunosuppression given her history of recurrent and severe infections in the past.  Discussed the apprehension to add any immunosuppressive agents at this time.  The patient restarted on Otezla 30 mg 1 tablet twice daily 2 to 3 weeks ago but has been experiencing significant GI upset including diarrhea.  Recommended reinitiating Otezla using 2 starter packs to help minimize GI side effects.  If tolerated she will try to take the maintenance dose of Otezla 30 mg 1 tablet twice daily in the future.  She will notify us if she can no longer tolerate Otezla in the future.  Do not recommend any further immunosuppression at this time.  She was advised to notify us if she starts to have more recurrent flares.  She will follow-up in the office in 3 months.  Psoriasis: Inverse psoriasis.  Under the care of dermatology.  Using triamcinolone cream topically PRN.  Restarting otezla as discussed above.   High risk medication use - Otezla 30 mg 1 tablet by mouth twice daily. Initiated otezla on 05/28/21 initially.   Restarted Otezla in May 2024 after a gap in therapy for 6 months.  Previous therapy: Cosentyx: History of diverticulitis with sigmoid colon abscess. Patient continues to have recurrent infections most recently a nonhealing surgical incision followed by MRSA.  Not a good candidate for immunosuppressive agents at this time.  Thrombocytosis: Platelet count was within normal limits: 281K on 07/09/22.   Primary osteoarthritis of both hands: No synovitis or dactylitis was noted on examination today.  She is able to make a complete fist bilaterally.  She has noticed intermittent inflammation in her hands.  At times she has difficulty performing ADLs.  Ulnar neuropathy of both upper extremities - S/p surgical release bilaterally-performed by Dr. Madelon Lips.  S/P total knee replacement, left: Warmth but no effusion.  Using a walker to assist with ambulation.  Primary osteoarthritis of right knee: Good range of motion of the right knee joint with no discomfort at this time.  No warmth or effusion noted.  Primary osteoarthritis of both feet: Patient has been under the care of Dr. Susa Simmonds.  She underwent right Achilles tendon reconstruction on 03/26/2022.  According to the patient the surgery was successful but during the postoperative period she had a fall requiring a surgical revision.  She ended up having a nonhealing incision and eventually MRSA.  She had to have hardware removal performed on 07/07/2022 by Dr. Susa Simmonds.  She is currently in a walking boot and using a walker to assist with ambulation.  She is going to physical therapy as advised.  Patient states that she may require a another surgery in the future if her pain and gait do not improve.  DDD (degenerative disc disease), lumbar - Dr. Petra Kuba.  Other medical conditions are listed as follows:   Chronic  anticoagulation  Diverticulitis of large intestine with abscess without bleeding  History of gastric bypass  Nocturnal muscle cramps  History  of pulmonary embolism  Frequent falls  Orders: No orders of the defined types were placed in this encounter.  No orders of the defined types were placed in this encounter.     Follow-Up Instructions: Return in about 3 months (around 12/11/2022) for Psoriatic arthritis.   Gearldine Bienenstock, PA-C  Note - This record has been created using Dragon software.  Chart creation errors have been sought, but may not always  have been located. Such creation errors do not reflect on  the standard of medical care.

## 2022-09-04 ENCOUNTER — Other Ambulatory Visit: Payer: Self-pay

## 2022-09-10 ENCOUNTER — Encounter: Payer: Self-pay | Admitting: Physician Assistant

## 2022-09-10 ENCOUNTER — Telehealth: Payer: Self-pay | Admitting: Neurology

## 2022-09-10 ENCOUNTER — Ambulatory Visit: Payer: 59 | Attending: Physician Assistant | Admitting: Physician Assistant

## 2022-09-10 VITALS — BP 134/80 | HR 78 | Ht 65.0 in | Wt 286.6 lb

## 2022-09-10 DIAGNOSIS — L409 Psoriasis, unspecified: Secondary | ICD-10-CM | POA: Diagnosis not present

## 2022-09-10 DIAGNOSIS — G5623 Lesion of ulnar nerve, bilateral upper limbs: Secondary | ICD-10-CM

## 2022-09-10 DIAGNOSIS — M19041 Primary osteoarthritis, right hand: Secondary | ICD-10-CM

## 2022-09-10 DIAGNOSIS — K5720 Diverticulitis of large intestine with perforation and abscess without bleeding: Secondary | ICD-10-CM

## 2022-09-10 DIAGNOSIS — M5136 Other intervertebral disc degeneration, lumbar region: Secondary | ICD-10-CM

## 2022-09-10 DIAGNOSIS — R296 Repeated falls: Secondary | ICD-10-CM

## 2022-09-10 DIAGNOSIS — Z86711 Personal history of pulmonary embolism: Secondary | ICD-10-CM

## 2022-09-10 DIAGNOSIS — M19042 Primary osteoarthritis, left hand: Secondary | ICD-10-CM

## 2022-09-10 DIAGNOSIS — Z7901 Long term (current) use of anticoagulants: Secondary | ICD-10-CM

## 2022-09-10 DIAGNOSIS — D75839 Thrombocytosis, unspecified: Secondary | ICD-10-CM | POA: Diagnosis not present

## 2022-09-10 DIAGNOSIS — G43109 Migraine with aura, not intractable, without status migrainosus: Secondary | ICD-10-CM

## 2022-09-10 DIAGNOSIS — Z96652 Presence of left artificial knee joint: Secondary | ICD-10-CM

## 2022-09-10 DIAGNOSIS — M19072 Primary osteoarthritis, left ankle and foot: Secondary | ICD-10-CM

## 2022-09-10 DIAGNOSIS — M1711 Unilateral primary osteoarthritis, right knee: Secondary | ICD-10-CM

## 2022-09-10 DIAGNOSIS — L4050 Arthropathic psoriasis, unspecified: Secondary | ICD-10-CM | POA: Diagnosis not present

## 2022-09-10 DIAGNOSIS — M19071 Primary osteoarthritis, right ankle and foot: Secondary | ICD-10-CM

## 2022-09-10 DIAGNOSIS — Z79899 Other long term (current) drug therapy: Secondary | ICD-10-CM | POA: Diagnosis not present

## 2022-09-10 DIAGNOSIS — Z9884 Bariatric surgery status: Secondary | ICD-10-CM

## 2022-09-10 DIAGNOSIS — R252 Cramp and spasm: Secondary | ICD-10-CM

## 2022-09-10 MED ORDER — GABAPENTIN 300 MG PO CAPS: 600.0000 mg | ORAL_CAPSULE | Freq: Two times a day (BID) | ORAL | 1 refills | Status: DC

## 2022-09-10 NOTE — Telephone Encounter (Signed)
Pt called in stating her headaches are coming back very strong. She is getting them at least 3 or 4 times a day now. This has been going on about 3 weeks now.

## 2022-09-10 NOTE — Telephone Encounter (Signed)
Pt called informed that With her worsening headaches, I recommend that we check MRI brain and MRA head and neck.    For pain, her gabapentin can be increased to 600mg  twice daily, if she needs a new prescription, we can send it.

## 2022-09-10 NOTE — Progress Notes (Signed)
Medication Samples have been provided to the patient.  Drug name: Otezla starter pack       Strength: titration dose        Qty: 2 packs  LOT: [id number]  Exp.Date: 04/27/2023  Dosing instructions: Take as directed on pack.

## 2022-09-10 NOTE — Telephone Encounter (Signed)
With her worsening headaches, I recommend that we check MRI brain and MRA head and neck.    For pain, her gabapentin can be increased to 600mg  twice daily, if she needs a new prescription, we can send it.

## 2022-09-29 ENCOUNTER — Other Ambulatory Visit (HOSPITAL_COMMUNITY): Payer: Self-pay

## 2022-10-05 ENCOUNTER — Other Ambulatory Visit: Payer: Self-pay

## 2022-10-10 ENCOUNTER — Ambulatory Visit
Admission: RE | Admit: 2022-10-10 | Discharge: 2022-10-10 | Disposition: A | Discharge status: Home / Self Care | Payer: 59 | Source: Ambulatory Visit | Attending: Neurology | Admitting: Neurology

## 2022-10-10 DIAGNOSIS — G43109 Migraine with aura, not intractable, without status migrainosus: Secondary | ICD-10-CM

## 2022-10-10 MED ORDER — GADOPICLENOL 0.5 MMOL/ML IV SOLN
10.0000 mL | Freq: Once | INTRAVENOUS | Status: AC | PRN
  Administered 2022-10-10: 10 mL via INTRAVENOUS

## 2022-10-22 ENCOUNTER — Other Ambulatory Visit: Payer: Self-pay | Admitting: Neurology

## 2022-10-22 DIAGNOSIS — G43109 Migraine with aura, not intractable, without status migrainosus: Secondary | ICD-10-CM

## 2022-10-22 MED ORDER — PREDNISONE 10 MG (21) PO TBPK: ORAL_TABLET | ORAL | 0 refills | Status: DC

## 2022-10-24 ENCOUNTER — Other Ambulatory Visit: Payer: 59

## 2022-10-28 ENCOUNTER — Other Ambulatory Visit (HOSPITAL_COMMUNITY): Payer: Self-pay

## 2022-11-02 ENCOUNTER — Other Ambulatory Visit (HOSPITAL_COMMUNITY): Payer: Self-pay

## 2022-11-02 ENCOUNTER — Encounter (HOSPITAL_COMMUNITY): Payer: Self-pay

## 2022-11-12 ENCOUNTER — Other Ambulatory Visit: Payer: 59

## 2022-12-01 NOTE — Progress Notes (Deleted)
Office Visit Note  Patient: Connie Hoffman             Date of Birth: 25-Dec-1960           MRN: [id number]             PCP: Kerin Salen, PA-C Referring: Kathaleen Bury* Visit Date: 12/15/2022 Occupation: @GUAROCC @  Subjective:    History of Present Illness: Connie Hoffman is a 62 y.o. female with history of psoriatic arthritis and osteoarthritis.  Patient remains on Otezla 30 mg BID.     Activities of Daily Living:  Patient reports morning stiffness for *** {minute/hour:19697}.   Patient {ACTIONS;DENIES/REPORTS:21021675::"Denies"} nocturnal pain.  Difficulty dressing/grooming: {ACTIONS;DENIES/REPORTS:21021675::"Denies"} Difficulty climbing stairs: {ACTIONS;DENIES/REPORTS:21021675::"Denies"} Difficulty getting out of chair: {ACTIONS;DENIES/REPORTS:21021675::"Denies"} Difficulty using hands for taps, buttons, cutlery, and/or writing: {ACTIONS;DENIES/REPORTS:21021675::"Denies"}  No Rheumatology ROS completed.   PMFS History:  Patient Active Problem List   Diagnosis Date Noted  . ABLA (acute blood loss anemia) 07/08/2022  . T2DM (type 2 diabetes mellitus) (HCC) 07/08/2022  . Hyperglycemia 07/08/2022  . History of anxiety 07/08/2022  . Hypertension 07/08/2022  . Obesity, Class III, BMI 40-49.9 (morbid obesity) (HCC) 07/08/2022  . Anxiety 07/08/2022  . Pre-diabetes 07/08/2022  . Right ankle pain 07/07/2022  . Open wound of right foot 06/03/2022  . Achilles tendonosis of right lower extremity 03/26/2022  . Seizure (HCC) 11/03/2021  . Diverticulitis of large intestine with abscess without bleeding 05/28/2021  . History of pulmonary embolism 05/28/2021  . Chronic anticoagulation 05/28/2021  . Midline cystocele 05/23/2020  . Pelvic mass 12/04/2019  . DDD (degenerative disc disease), lumbar 01/21/2018  . Primary osteoarthritis of both hands 01/21/2018  . Primary osteoarthritis of both feet 01/21/2018  . Psoriasis 12/01/2017  . Psoriatic  arthritis (HCC) 12/01/2017  . High risk medication use 12/01/2017  . 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 Thrombocytosis 12/01/2017  . 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 Anemia   . Anxiety   . Chest pain syndrome   . Chronic stable angina   . DDD (degenerative disc disease), lumbar   . Gastritis   . Headache   . Hematuria    resolved after removing ASA, only on Eliquis now  . History of hiatal hernia   . Hypertension   . Intestinal abscess   . MRSA infection    After knee surgery  . Observed seizure-like activity (HCC)   . Pre-diabetes   . Precordial pain   . Psoriatic arthritis (HCC)   . Pulmonary embolism (HCC)    Bilateral lungs  . Refusal of blood transfusions as patient is Jehovah's Witness   . 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CC)   . Sacroiliac pain   . Seizure (HCC)   . Spinal stenosis   . Thyroid nodule     Family History  Problem Relation Age of Onset  . Diabetes Mother   . Hypertension Mother   . Heart Problems Mother   . Congestive Heart Failure Mother   . Diabetes Father   . Hypertension Father   . Psoriasis Father   . Stroke Sister   . Heart Problems Sister   . Seizures Sister   . Prostate cancer Brother   . Diabetes Brother   . Asthma Daughter   . Healthy Daughter   . Healthy Son   . Healthy Son    Past Surgical History:  Procedure Laterality Date  . ABDOMINAL HYSTERECTOMY    . ARTHRODESIS  FOOT WITH WEIL OSTEOTOMY Right 03/26/2022   Procedure: GASTROCNEMIUS RECESSION, CALCANEAL EXOSTECTOMY;  Surgeon: Terance Hart, MD;  Location: [facility] OR;  Service: Orthopedics;  Laterality: Right;  . BACK SURGERY  05/18/2019   L4 and L5  . CARDIAC CATHETERIZATION    . CHOLECYSTECTOMY    . CYSTECTOMY  07/2017  . FOOT SURGERY Right 05/08/2022  . JOINT REPLACEMENT Left    left knee  . LAPAROSCOPIC ENDOMETRIOSIS FULGURATION  2011  . LAPAROSCOPIC GASTRIC SLEEVE RESECTION  2018  . LAPAROSCOPIC OOPHERECTOMY  Right   . LUMBAR EPIDURAL INJECTION  04/13/2018  . MINOR HARDWARE REMOVAL Right 07/07/2022   Procedure: RIGHT ANKLE DEEP HARDWARE REMOVAL;  Surgeon: Terance Hart, MD;  Location: [facility] OR;  Service: Orthopedics;  Laterality: Right;  . REVISION TOTAL KNEE ARTHROPLASTY Left 12/2017  . TENOLYSIS Right 03/26/2022   Procedure: RIGHT ACHILLES TENDON RECONSTRUCTION AND DEBRIDEMENT;  Surgeon: Terance Hart, MD;  Location: [facility] OR;  Service: Orthopedics;  Laterality: Right;  LENGTH OF SURGERY: 120 MINUTES  . TOTAL SHOULDER ARTHROPLASTY Bilateral   . TRIGGER FINGER RELEASE Bilateral 2023   bilateral 3rd digit  . TUBAL LIGATION     postpartum  . ULNAR NERVE REPAIR Bilateral 2023   Social History   Social History Narrative   Right handed    Lives with husband and 1 daughters    Immunization History  Administered Date(s) Administered  . Influenza,inj,Quad PF,6+ Mos 04/15/2018  . PFIZER(Purple Top)SARS-COV-2 Vaccination 07/15/2019, 08/05/2019, 03/13/2020  . Pneumococcal Polysaccharide-23 04/15/2018  . Zoster Recombinant(Shingrix) 05/13/2018, 10/25/2019     Objective: Vital Signs: There were no vitals taken for this visit.   Physical Exam Vitals and nursing note reviewed.  Constitutional:      Appearance: She is well-developed.  HENT:     Head: Normocephalic and atraumatic.  Eyes:     Conjunctiva/sclera: Conjunctivae normal.  Cardiovascular:     Rate and Rhythm: Normal rate and regular rhythm.     Heart sounds: Normal heart sounds.  Pulmonary:     Effort: Pulmonary effort is normal.     Breath sounds: Normal breath sounds.  Abdominal:     General: Bowel sounds are normal.     Palpations: Abdomen is soft.  Musculoskeletal:     Cervical back: Normal range of motion.  Lymphadenopathy:     Cervical: No cervical adenopathy.  Skin:    General: Skin is warm and dry.     Capillary Refill: Capillary refill takes less than 2 seconds.  Neurological:     Mental Status: She is  alert and oriented to person, place, and time.  Psychiatric:        Behavior: Behavior normal.     Musculoskeletal Exam: ***  CDAI Exam: CDAI Score: -- Patient Global: --; Provider Global: -- Swollen: --; Tender: -- Joint Exam 12/15/2022   No joint exam has been documented for this visit   There is currently no information documented on the homunculus. Go to the Rheumatology activity and complete the homunculus joint exam.  Investigation: No additional findings.  Imaging: No results found.  Recent Labs: Lab Results  Component Value Date   WBC 9.8 07/09/2022   HGB 8.1 (L) 07/09/2022   PLT 281 07/09/2022   NA 136 07/08/2022   K 4.4 07/08/2022   CL 104 07/08/2022   CO2 23 07/08/2022   GLUCOSE 330 (H) 07/08/2022   BUN 9 07/08/2022   CREATININE 0.81 07/08/2022   BILITOT 0.4 07/08/2022   ALKPHOS 102 07/08/2022  AST 50 (H) 07/08/2022   ALT 51 (H) 07/08/2022   PROT 6.5 07/08/2022   ALBUMIN 3.0 (L) 07/08/2022   CALCIUM 8.9 07/08/2022   GFRAA 103 10/08/2020   QFTBGOLDPLUS NEGATIVE 05/01/2020    Speciality Comments: Cosentyx start date:04/2018  Procedures:  No procedures performed Allergies: Aspirin and Tylenol [acetaminophen]   Assessment / Plan:     Visit Diagnoses: Psoriatic arthritis (HCC)  Psoriasis  High risk medication use  Thrombocytosis  Primary osteoarthritis of both hands  Ulnar neuropathy of both upper extremities  S/P total knee replacement, left  Primary osteoarthritis of right knee  Primary osteoarthritis of both feet  DDD (degenerative disc disease), lumbar  Chronic anticoagulation  Diverticulitis of large intestine with abscess without bleeding  History of gastric bypass  Nocturnal muscle cramps  History of pulmonary embolism  Frequent falls  Orders: No orders of the defined types were placed in this encounter.  No orders of the defined types were placed in this encounter.   Face-to-face time spent with patient was ***  minutes. Greater than 50% of time was spent in counseling and coordination of care.  Follow-Up Instructions: No follow-ups on file.   Gearldine Bienenstock, PA-C  Note - This record has been created using Dragon software.  Chart creation errors have been sought, but may not always  have been located. Such creation errors do not reflect on  the standard of medical care.

## 2022-12-03 ENCOUNTER — Other Ambulatory Visit (HOSPITAL_COMMUNITY): Payer: Self-pay

## 2022-12-08 ENCOUNTER — Other Ambulatory Visit: Payer: 59

## 2022-12-15 ENCOUNTER — Ambulatory Visit: Payer: 59 | Admitting: Physician Assistant

## 2022-12-15 DIAGNOSIS — G5623 Lesion of ulnar nerve, bilateral upper limbs: Secondary | ICD-10-CM

## 2022-12-15 DIAGNOSIS — K5720 Diverticulitis of large intestine with perforation and abscess without bleeding: Secondary | ICD-10-CM

## 2022-12-15 DIAGNOSIS — R296 Repeated falls: Secondary | ICD-10-CM

## 2022-12-15 DIAGNOSIS — Z7901 Long term (current) use of anticoagulants: Secondary | ICD-10-CM

## 2022-12-15 DIAGNOSIS — Z86711 Personal history of pulmonary embolism: Secondary | ICD-10-CM

## 2022-12-15 DIAGNOSIS — D75839 Thrombocytosis, unspecified: Secondary | ICD-10-CM

## 2022-12-15 DIAGNOSIS — M5136 Other intervertebral disc degeneration, lumbar region: Secondary | ICD-10-CM

## 2022-12-15 DIAGNOSIS — Z9884 Bariatric surgery status: Secondary | ICD-10-CM

## 2022-12-15 DIAGNOSIS — L4050 Arthropathic psoriasis, unspecified: Secondary | ICD-10-CM

## 2022-12-15 DIAGNOSIS — M19071 Primary osteoarthritis, right ankle and foot: Secondary | ICD-10-CM

## 2022-12-15 DIAGNOSIS — M1711 Unilateral primary osteoarthritis, right knee: Secondary | ICD-10-CM

## 2022-12-15 DIAGNOSIS — L409 Psoriasis, unspecified: Secondary | ICD-10-CM

## 2022-12-15 DIAGNOSIS — M19041 Primary osteoarthritis, right hand: Secondary | ICD-10-CM

## 2022-12-15 DIAGNOSIS — R252 Cramp and spasm: Secondary | ICD-10-CM

## 2022-12-15 DIAGNOSIS — Z96652 Presence of left artificial knee joint: Secondary | ICD-10-CM

## 2022-12-15 DIAGNOSIS — Z79899 Other long term (current) drug therapy: Secondary | ICD-10-CM

## 2022-12-21 ENCOUNTER — Ambulatory Visit
Admission: RE | Admit: 2022-12-21 | Discharge: 2022-12-21 | Disposition: A | Discharge status: Home / Self Care | Payer: 59 | Source: Ambulatory Visit | Attending: Neurology | Admitting: Neurology

## 2022-12-21 ENCOUNTER — Ambulatory Visit: Admission: RE | Admit: 2022-12-21 | Payer: 59 | Source: Ambulatory Visit

## 2022-12-21 DIAGNOSIS — G43109 Migraine with aura, not intractable, without status migrainosus: Secondary | ICD-10-CM

## 2022-12-21 MED ORDER — GADOPICLENOL 0.5 MMOL/ML IV SOLN
10.0000 mL | Freq: Once | INTRAVENOUS | Status: AC | PRN
  Administered 2022-12-21: 10 mL via INTRAVENOUS

## 2022-12-23 NOTE — Progress Notes (Unsigned)
Office Visit Note  Patient: Connie Hoffman             Date of Birth: [birth year]           MRN: [id number]             PCP: Kerin Salen, PA-C Referring: Kathaleen Bury* Visit Date: 12/24/2022 Occupation: @GUAROCC @  Subjective:  Pain in multiple joints   History of Present Illness: Connie Hoffman is a 62 y.o. female with history of psoriatic arthritis.   Patient was restarted on Otezla in May 2024.  She has been taking Otezla 30 mg 1 tablet twice daily but continues to have diarrhea.  She has been taking Imodium every other day to manage the side effects.  Patient states that she has not noticed any clinical benefit since reinitiating [place].  She states she is actually feels like there has been a decline condition.  She has psoriasis behind both the ears, under both breasts, and in the inguinal region.  She has been using topical agents.  Patient states she is also been experiencing increased total body pain and describes it as an aching sensation.  Her pain has been severe in both hands especially the right hand.  Patient has been using ice on her right Achilles and heat on her back and knees for relief.  Patient states she has been experiencing fatigue on a daily basis and has not been sleeping well at night. She discontinued Cymbalta in November 2023 due to wanting to be on less medications.  She was previously on low-dose Cymbalta 30 mg 1 capsule daily.  She remains on gabapentin as prescribed.  She has not been taking any medications for pain relief.    Activities of Daily Living:  Patient reports morning stiffness for all day. Patient Reports nocturnal pain.  Difficulty dressing/grooming: Reports Difficulty climbing stairs: Reports Difficulty getting out of chair: Reports Difficulty using hands for taps, buttons, cutlery, and/or writing: Reports  Review of Systems  Constitutional:  Positive for fatigue.  HENT:  Negative for mouth sores and mouth  dryness.   Eyes:  Positive for dryness. Negative for redness and visual disturbance.  Respiratory:  Negative for shortness of breath.   Cardiovascular:  Positive for chest pain. Negative for palpitations and swelling in legs/feet.  Gastrointestinal:  Positive for diarrhea. Negative for blood in stool and constipation.  Endocrine: Negative for increased urination.  Genitourinary:  Negative for involuntary urination.  Musculoskeletal:  Positive for joint pain, gait problem, joint pain, joint swelling, myalgias, muscle weakness, morning stiffness, muscle tenderness and myalgias.  Skin:  Positive for color change, rash and sensitivity to sunlight.  Allergic/Immunologic: Positive for susceptible to infections.  Neurological:  Positive for dizziness and headaches.  Hematological:  Negative for swollen glands.  Psychiatric/Behavioral:  Positive for sleep disturbance. Negative for depressed mood. The patient is not nervous/anxious.     PMFS History:  Patient Active Problem List   Diagnosis Date Noted   ABLA (acute blood loss anemia) 07/08/2022   T2DM (type 2 diabetes mellitus) (HCC) 07/08/2022   Hyperglycemia 07/08/2022   History of anxiety 07/08/2022   Hypertension 07/08/2022   Obesity, Class III, BMI 40-49.9 (morbid obesity) (HCC) 07/08/2022   Anxiety 07/08/2022   Pre-diabetes 07/08/2022   Right ankle pain 07/07/2022   Open wound of right foot 06/03/2022   Achilles tendonosis of right lower extremity 03/26/2022   Seizure (HCC) 11/03/2021   Diverticulitis of large intestine with abscess without bleeding 05/28/2021   History  of pulmonary embolism 05/28/2021   Chronic anticoagulation 05/28/2021   Midline cystocele 05/23/2020   Pelvic mass 12/04/2019   DDD (degenerative disc disease), lumbar 01/21/2018   Primary osteoarthritis of both hands 01/21/2018   Primary osteoarthritis of both feet 01/21/2018   Psoriasis 12/01/2017   Psoriatic arthritis (HCC) 12/01/2017   High risk medication use  12/01/2017   S/P total knee replacement, 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   Thrombocytosis 12/01/2017   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    Past Medical History:  Diagnosis Date   Anemia    Anxiety    Chest pain syndrome    Chronic stable angina    DDD (degenerative disc disease), lumbar    Gastritis    Headache    Hematuria    resolved after removing ASA, only on Eliquis now   History of hiatal hernia    Hypertension    Intestinal abscess    MRSA infection    After knee surgery   Observed seizure-like activity (HCC)    Pre-diabetes    Precordial pain    Psoriatic arthritis (HCC)    Pulmonary embolism (HCC)    Bilateral lungs   Refusal of blood transfusions as patient is Jehovah's Witness    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CC)    Sacroiliac pain    Seizure (HCC)    Spinal stenosis    Thyroid nodule     Family History  Problem Relation Age of Onset   Diabetes Mother    Hypertension Mother    Heart Problems Mother    Congestive Heart Failure Mother    Diabetes Father    Hypertension Father    Psoriasis Father    Stroke Sister    Heart Problems Sister    Seizures Sister    Prostate cancer Brother    Diabetes Brother    Asthma Daughter    Healthy Daughter    Healthy Son    Healthy Son    Past Surgical History:  Procedure Laterality Date   ABDOMINAL HYSTERECTOMY     ARTHRODESIS FOOT WITH WEIL OSTEOTOMY Right 03/26/2022   Procedure: GASTROCNEMIUS RECESSION, CALCANEAL EXOSTECTOMY;  Surgeon: Terance Hart, MD;  Location: [facility] OR;  Service: Orthopedics;  Laterality: Right;   BACK SURGERY  05/18/2019   L4 and L5   CARDIAC CATHETERIZATION     CHOLECYSTECTOMY     CYSTECTOMY  07/2017   FOOT SURGERY Right 05/08/2022   JOINT REPLACEMENT Left    left knee   LAPAROSCOPIC ENDOMETRIOSIS FULGURATION  2011   LAPAROSCOPIC GASTRIC SLEEVE RESECTION  2018   LAPAROSCOPIC OOPHERECTOMY Right    LUMBAR EPIDURAL INJECTION  04/13/2018   MINOR HARDWARE REMOVAL Right 07/07/2022   Procedure:  RIGHT ANKLE DEEP HARDWARE REMOVAL;  Surgeon: Terance Hart, MD;  Location: [facility] OR;  Service: Orthopedics;  Laterality: Right;   REVISION TOTAL KNEE ARTHROPLASTY Left 12/2017   TENOLYSIS Right 03/26/2022   Procedure: RIGHT ACHILLES TENDON RECONSTRUCTION AND DEBRIDEMENT;  Surgeon: Terance Hart, MD;  Location: [facility] OR;  Service: Orthopedics;  Laterality: Right;  LENGTH OF SURGERY: 120 MINUTES   TOTAL SHOULDER ARTHROPLASTY Bilateral    TRIGGER FINGER RELEASE Bilateral 2023   bilateral 3rd digit   TUBAL LIGATION     postpartum   ULNAR NERVE REPAIR Bilateral 2023   Social History   Social History Narrative   Right handed    Lives with husband and 1 daughters    Immunization History  Administered Date(s) Administered   Influenza,inj,Quad PF,6+ Mos 04/15/2018  PFIZER(Purple Top)SARS-COV-2 Vaccination 07/15/2019, 08/05/2019, 03/13/2020   Pneumococcal Polysaccharide-23 04/15/2018   Zoster Recombinant(Shingrix) 05/13/2018, 10/25/2019     Objective: Vital Signs: BP 114/79 (BP Location: Left Arm, Patient Position: Sitting, Cuff Size: Large)   Pulse 71   Resp 16   Ht 5\' 5"  (1.651 m)   Wt 283 lb 3.2 oz (128.5 kg)   BMI 47.13 kg/m    Physical Exam Vitals and nursing note reviewed.  Constitutional:      Appearance: She is well-developed.  HENT:     Head: Normocephalic and atraumatic.  Eyes:     Conjunctiva/sclera: Conjunctivae normal.  Cardiovascular:     Rate and Rhythm: Normal rate and regular rhythm.     Heart sounds: Normal heart sounds.  Pulmonary:     Effort: Pulmonary effort is normal.     Breath sounds: Normal breath sounds.  Abdominal:     General: Bowel sounds are normal.     Palpations: Abdomen is soft.  Musculoskeletal:     Cervical back: Normal range of motion.  Lymphadenopathy:     Cervical: No cervical adenopathy.  Skin:    General: Skin is warm and dry.     Capillary Refill: Capillary refill takes less than 2 seconds.  Neurological:     Mental  Status: She is alert and oriented to person, place, and time.  Psychiatric:        Behavior: Behavior normal.      Musculoskeletal Exam: Generalized hyperalgesia noted on exam.  Painful range of motion of the C-spine.  Painful range of motion of both shoulders.  Elbow joints, wrist joints, MCPs, PIPs, DIPs have good range of motion with no synovitis.  No dactylitis noted.  Tenderness over the right second and third MCP and PIP joints.  Painful range of motion of the right knee.  Left knee replacement has warmth but no effusion.  Tenderness over the right Achilles tendon.  CDAI Exam: CDAI Score: -- Patient Global: --; Provider Global: -- Swollen: --; Tender: -- Joint Exam 12/24/2022   No joint exam has been documented for this visit   There is currently no information documented on the homunculus. Go to the Rheumatology activity and complete the homunculus joint exam.  Investigation: No additional findings.  Imaging: MR ANGIO HEAD WO CONTRAST  Result Date: 12/21/2022 CLINICAL DATA:  Provided history: Migraine equivalent. Additional history provided by the scanning technologist: Neck pain. EXAM: MRA HEAD WITHOUT CONTRAST TECHNIQUE: Angiographic images of the Circle of Willis were acquired using MRA technique without intravenous contrast. COMPARISON:  Same-day brain MRI 12/21/2022.  MRA neck 10/10/2022 FINDINGS: Anterior circulation: The intracranial internal carotid arteries are patent. The M1 middle cerebral arteries are patent. No M2 proximal branch occlusion or high-grade proximal stenosis. The anterior cerebral arteries are patent. 1-2 mm inferiorly projecting vascular protrusion arising from the cavernous right ICA, likely reflecting an aneurysm (best appreciated on series 13, image 26). Posterior circulation: The intracranial vertebral arteries are patent. The basilar artery is patent. The posterior cerebral arteries are patent. Posterior communicating arteries are present bilaterally.  Anatomic variants: None significant. IMPRESSION: 1. No intracranial large vessel occlusion or proximal high-grade arterial stenosis. 2. 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 Electronically Signed   By: Jackey Loge D.O.   On: 12/21/2022 17:56   MR BRAIN W WO CONTRAST  Result Date: 12/21/2022 CLINICAL DATA:  Provided history: Migraine equivalent. Headache. Additional history provided by the scanning technologist: Neck pain.  EXAM: MRI HEAD WITHOUT AND WITH CONTRAST TECHNIQUE: Multiplanar, multiecho pulse sequences of the brain and surrounding structures were obtained without and with intravenous contrast. CONTRAST:  10 mL Vueway intravenous contrast. COMPARISON:  Brain MRI 02/20/2021. FINDINGS: Brain: No age advanced or lobar predominant cerebral atrophy. Mild cerebellar atrophy. Partially empty and mildly enlarged sella turcica. No cortical encephalomalacia is identified. No significant cerebral white matter disease. There is no acute infarct. No evidence of an intracranial mass. No chronic intracranial blood products. No extra-axial fluid collection. No midline shift. No pathologic intracranial enhancement identified. Vascular: No mass or acute finding within the imaged orbits. Skull and upper cervical spine: No focal suspicious marrow lesion. Sinuses/Orbits: No mass or acute finding within the imaged orbits. Mild mucosal thickening within the right frontal sinus. IMPRESSION: 1. No evidence of an acute intracranial abnormality. 2. Partially empty and mildly enlarged sella turcica. This finding can reflect incidental anatomic variation, or alternatively, it can be associated with idiopathic intracranial hypertension (pseudotumor cerebri). 3. Mild cerebellar atrophy, similar to the prior brain MRI of 02/20/2021. 4. Mild mucosal thickening within the right frontal sinus. Electronically Signed   By: Jackey Loge D.O.   On: 12/21/2022 17:44     Recent Labs: Lab Results  Component Value Date   WBC 9.8 07/09/2022   HGB 8.1 (L) 07/09/2022   PLT 281 07/09/2022   NA 136 07/08/2022   K 4.4 07/08/2022   CL 104 07/08/2022   CO2 23 07/08/2022   GLUCOSE 330 (H) 07/08/2022   BUN 9 07/08/2022   CREATININE 0.81 07/08/2022   BILITOT 0.4 07/08/2022   ALKPHOS 102 07/08/2022   AST 50 (H) 07/08/2022   ALT 51 (H) 07/08/2022   PROT 6.5 07/08/2022   ALBUMIN 3.0 (L) 07/08/2022   CALCIUM 8.9 07/08/2022   GFRAA 103 10/08/2020   QFTBGOLDPLUS NEGATIVE 05/01/2020    Speciality Comments: Cosentyx start date:04/2018  Procedures:  No procedures performed Allergies: Aspirin and Tylenol [acetaminophen]   Assessment / Plan:     Visit Diagnoses: Psoriatic arthritis (HCC): No synovitis or dactylitis was noted on examination today.  Patient presents with total body pain and joint stiffness.  Her pain level has been interfering with her quality of life.  She is unable to perform activities that she would like to do due to the severity of symptoms.  She has not been taking any over-the-counter products for pain relief but uses ice and heat as needed.  She is not a good candidate for NSAID use due to being prescribed Eliquis.  She has been staying away from acetaminophen due to history of elevated LFTs. The patient was restarted on Otezla in May 2024 but has not noticed any clinical benefit.  She has had continued diarrhea and has been taking Imodium every other day to alleviate this side effect.  She has been using topical agents for management of inverse psoriasis and does not feel that [place] is helping to manage her psoriasis or her psoriatic arthritis.  Discussed discontinuing [place] since the risks outweigh the benefits currently.  Discussed that she has not a good candidate for systemic immunosuppression due to history of recurrent infections including a sigmoid colon abscess, nonhealing surgical incision, and MRSA. Different options were discussed  for chronic pain management including a referral to a pain management clinic.  Also discussed resuming Cymbalta to help with mood and her pain levels.  Plan to reinitiate Cymbalta 30 mg 1 capsule daily x 1 month and if tolerated she can increase to  60 mg at that time.  She will notify us if she cannot tolerate taking Cymbalta.  Patient also plans on starting water aerobics which I feel will be beneficial for her joint pain and stiffness. For now we will hold off on adding any immunosuppressive agents. She will follow-up in the office in 3 months or sooner if needed.  Psoriasis - Inverse psoriasis.  Under the care of dermatology.  Using triamcinolone cream topically PRN.  No benefit with otezla-plan to discontinue.   She is not a good candidate for systemic immunosuppressive therapy due to recurrent infections.   High risk medication use - Plan to discontinue Otezla 30 mg 1 tablet by mouth twice daily. Initiated otezla on 05/28/21 initially.  Restarted Otezla in May 2024 after a gap in therapy for 6 months.  Patient has noticed a 0% improvement in her symptoms since reinitiating [place].  She would like to discontinue Otezla due to no clinical benefit as well as ongoing diarrhea. Previous therapy: Cosentyx: History of diverticulitis with sigmoid colon abscess. Patient continues to have recurrent infections most recently a nonhealing surgical incision followed by MRSA.  Not a good candidate for immunosuppressive agents at this time. CBC and CMP updated on 11/11/22.   Thrombocytosis: Plt 410K on 11/11/22.    Primary osteoarthritis of both hands: No synovitis or dactylitis noted on examination today.   Ulnar neuropathy of both upper extremities - S/p surgical release bilaterally-performed by Dr. Madelon Lips.  S/P total knee replacement, left: Chronic pain. Warmth but no effusion. Planning to start water aerobics.     Primary osteoarthritis of right knee: Chronic pain.  Patient applies heat as needed for pain  relief. Patient plans to initiate water aerobics.  Referral to pain management was placed today.  Primary osteoarthritis of both feet - Patient has been under the care of Dr. Susa Simmonds.  She underwent right Achilles tendon reconstruction on 03/26/2022.  DDD (degenerative disc disease), lumbar: Chronic pain. Patient plans to start water aerobics.  Referral to pain management placed today.  Other medical conditions are listed as follows:  Chronic anticoagulation: Patient remains on Eliquis as prescribed.  Diverticulitis of large intestine with abscess without bleeding  History of gastric bypass  Nocturnal muscle cramps  History of pulmonary embolism: Patient remains on Eliquis as prescribed.  Orders: No orders of the defined types were placed in this encounter.  No orders of the defined types were placed in this encounter.    Follow-Up Instructions: Return in about 3 months (around 03/26/2023) for Psoriatic arthritis, Osteoarthritis.   Gearldine Bienenstock, PA-C  Note - This record has been created using Dragon software.  Chart creation errors have been sought, but may not always  have been located. Such creation errors do not reflect on  the standard of medical care.

## 2022-12-24 ENCOUNTER — Ambulatory Visit: Payer: 59 | Attending: Physician Assistant | Admitting: Physician Assistant

## 2022-12-24 ENCOUNTER — Encounter: Payer: Self-pay | Admitting: Physician Assistant

## 2022-12-24 ENCOUNTER — Other Ambulatory Visit: Payer: Self-pay

## 2022-12-24 ENCOUNTER — Other Ambulatory Visit (HOSPITAL_COMMUNITY): Payer: Self-pay

## 2022-12-24 VITALS — BP 114/79 | HR 71 | Resp 16 | Ht 65.0 in | Wt 283.2 lb

## 2022-12-24 DIAGNOSIS — D75839 Thrombocytosis, unspecified: Secondary | ICD-10-CM | POA: Diagnosis not present

## 2022-12-24 DIAGNOSIS — Z7901 Long term (current) use of anticoagulants: Secondary | ICD-10-CM

## 2022-12-24 DIAGNOSIS — M51369 Other intervertebral disc degeneration, lumbar region without mention of lumbar back pain or lower extremity pain: Secondary | ICD-10-CM

## 2022-12-24 DIAGNOSIS — M1711 Unilateral primary osteoarthritis, right knee: Secondary | ICD-10-CM

## 2022-12-24 DIAGNOSIS — Z96652 Presence of left artificial knee joint: Secondary | ICD-10-CM

## 2022-12-24 DIAGNOSIS — L4050 Arthropathic psoriasis, unspecified: Secondary | ICD-10-CM

## 2022-12-24 DIAGNOSIS — M5136 Other intervertebral disc degeneration, lumbar region: Secondary | ICD-10-CM

## 2022-12-24 DIAGNOSIS — M19041 Primary osteoarthritis, right hand: Secondary | ICD-10-CM

## 2022-12-24 DIAGNOSIS — G5623 Lesion of ulnar nerve, bilateral upper limbs: Secondary | ICD-10-CM

## 2022-12-24 DIAGNOSIS — M19072 Primary osteoarthritis, left ankle and foot: Secondary | ICD-10-CM

## 2022-12-24 DIAGNOSIS — Z79899 Other long term (current) drug therapy: Secondary | ICD-10-CM

## 2022-12-24 DIAGNOSIS — R252 Cramp and spasm: Secondary | ICD-10-CM

## 2022-12-24 DIAGNOSIS — M19042 Primary osteoarthritis, left hand: Secondary | ICD-10-CM

## 2022-12-24 DIAGNOSIS — M19071 Primary osteoarthritis, right ankle and foot: Secondary | ICD-10-CM

## 2022-12-24 DIAGNOSIS — K5720 Diverticulitis of large intestine with perforation and abscess without bleeding: Secondary | ICD-10-CM

## 2022-12-24 DIAGNOSIS — Z86711 Personal history of pulmonary embolism: Secondary | ICD-10-CM

## 2022-12-24 DIAGNOSIS — Z9884 Bariatric surgery status: Secondary | ICD-10-CM

## 2022-12-24 DIAGNOSIS — L409 Psoriasis, unspecified: Secondary | ICD-10-CM

## 2022-12-24 MED ORDER — DULOXETINE HCL 30 MG PO CPEP: 30.0000 mg | ORAL_CAPSULE | Freq: Every day | ORAL | 0 refills | Status: DC

## 2022-12-24 NOTE — Addendum Note (Signed)
Addended by: Ellen Henri on: 12/24/2022 04:55 PM   Modules accepted: Orders

## 2022-12-24 NOTE — Telephone Encounter (Signed)
Please review and sign pended cymbalta 30mg /daily rx for a 1 month supply. Thanks!

## 2023-01-04 ENCOUNTER — Encounter: Payer: Self-pay | Admitting: Neurology

## 2023-01-04 ENCOUNTER — Encounter: Payer: Self-pay | Admitting: Physical Medicine and Rehabilitation

## 2023-01-04 ENCOUNTER — Ambulatory Visit (INDEPENDENT_AMBULATORY_CARE_PROVIDER_SITE_OTHER): Payer: 59 | Admitting: Neurology

## 2023-01-04 VITALS — BP 144/68 | HR 82 | Ht 65.0 in | Wt 282.0 lb

## 2023-01-04 DIAGNOSIS — F8081 Childhood onset fluency disorder: Secondary | ICD-10-CM | POA: Diagnosis not present

## 2023-01-04 DIAGNOSIS — I720 Aneurysm of carotid artery: Secondary | ICD-10-CM | POA: Diagnosis not present

## 2023-01-04 NOTE — Progress Notes (Signed)
Follow-up Visit   Date: 01/04/23   Connie Hoffman MRN: [id number] DOB: 1960/09/10   Interim History: Connie Hoffman is a 62 y.o. 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  The patient was accompanied to the clinic by self.  IMPRESSION/PLAN: Atypical migraine, improved.  Imaging reviewed which shows partially empty sella turica, which was also present in 2022 when she had follow-up LP which showed normal OP of 15 making pseudotumor cerebri unlikely.  - Continue gabapentin 300mg  three times daily - Limit all OTC medications to twice per week  2.  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  Improved.   3.  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 Reassurance provided  Return to clinic 1 year  ---------------------------------------------------------- History of present illness: Starting early 2021, she began noticing numbness in both hands, with occasional tingling. Symptoms are noticeable when she wakes up or when she is resting.  She endorses weakness and difficulty opening jars/bottle.     She has tingling and cramping of the legs for the past 1-2 years and takes gabapentin 100mg  twice daily. She had tingling up to her knees, which is present at nighttime.  She was diagnosed with neuropathy in 2018 by [facility] while living in [place].  This was ordered by her pain management provider, who she was seeing for lumbar canal stenosis. She underwent lumbar surgery which has significantly helped her low back pain and tingling in her legs.  She continues to get muscle cramps at night time. She takes methocarbamol and tramadol for pain.     She takes methotrexate and Cosentyx for psoriatic arthritis.     UPDATE 09/19/2020:  Over the past 6 months, she has  developed piercing headaches, lasting any where from 10-45 minutes, which occurs daily. It is usually at the base of her head and behind the right ear.   It is worse with movement and in the evening.  She prefers quiet, dark room.  She has some relief with ice application to her neck.  She endorses photophobia, phonophobia, and mild dizziness.  She does not treat the headaches.  Headache does not wake her up from sleeping.  No worsening with coughing/sneezing.  No prior history of headaches.   The numbness and tingling in the hands is unchanged.  She has not started occupational therapy.   UPDATE 06/06/2021: He is here for follow-up visit.  At her last visit, she reported worsening headaches so MRI brain was obtained which showed partially empty sella.  Given her associated headaches, she underwent large-volume lumbar puncture which showed normal opening pressure, excluding pseudotumor cerebri.  Overall, her headaches have improved and rarely gets migraines after increasing gabapentin to 300mg  three times.   Her bigger concern is ongoing paresthesias of her hands, worse on the right. It wakes her up from sleeping eventhough she is mindful of hand and arm positioning. She does not report weakness.   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  She was also hospitalized in January 2023 with diverticulitis of the large intestines which was treated with antibiotic therapy.  UPDATE 08/12/2022:  She underwent bilateral ulnar nerve decompression in the summer of 2023 and reports having marked improvement of bilateral hand paresthesias.  She no longer has  numbness/tingling of the hands.  Since the end of March, she reports having increased headaches and takes up to 4 tylenol daily.  She has pressure in the forehead. She has been off gabapentin since March.    The past several months have been very difficult because she underwent right Achilles tendon  reconstruction and suffered a fall rupturing the tendon after the cast was removed, which prompted her to go back to the OR.  She has the foot immobilized in a CAM boot.    UPDATE 01/04/2023:  Headaches are overall improved since restarting gabapentin.  Now, she gets headaches about three times per week, which can last up to all day. She typically tries to get rest which helps.  She does not take any OTC medications.    MRI brain showed partially empty sella, which was also seen on prior MRI brain and LP in 2022 showed normal OP.   She is concerned about new stuttering with her speech.  This is worse when she is anxious/stressed.  She admits to having a lot of personal stressors right now.    Medications:  Current Outpatient Medications on File Prior to Visit  Medication Sig Dispense Refill   albuterol (VENTOLIN HFA) 108 (90 Base) MCG/ACT inhaler Inhale 2 puffs into the lungs every 6 (six) hours as needed for wheezing.     apixaban (ELIQUIS) 2.5 MG TABS tablet Take 2.5 mg by mouth 2 (two) times daily.     Apremilast (OTEZLA) 30 MG TABS Take 1 tablet (30 mg total) by mouth in the morning and at bedtime. 60 tablet 2   atorvastatin (LIPITOR) 80 MG tablet Take 80 mg by mouth daily.     Blood Glucose Monitoring Suppl DEVI 1 each by Does not apply route in the morning, at noon, and at bedtime. May substitute to any manufacturer covered by patient's insurance. 1 each 0   dexlansoprazole (DEXILANT) 60 MG capsule Take 60 mg by mouth daily.     DULoxetine (CYMBALTA) 30 MG capsule Take 1 capsule (30 mg total) by mouth daily. 30 capsule 0   gabapentin (NEURONTIN) 300 MG capsule Take 2 capsules (600 mg total) by mouth 2 (two) times daily. 360 capsule 1   isosorbide mononitrate (IMDUR) 30 MG 24 hr tablet Take 30 mg by mouth 2 (two) times daily.     Menthol, Topical Analgesic, (BIOFREEZE) 4 % GEL Apply 1 application  topically daily as needed (pain). Apply to painful sites daily as directed     Menthol, Topical  Analgesic, (ICY HOT EX) Apply 1 Application topically daily as needed (pain).     metoprolol succinate (TOPROL-XL) 50 MG 24 hr tablet Take 50 mg by mouth daily.     nitroGLYCERIN (NITROSTAT) 0.4 MG SL tablet Place 0.4 mg under the tongue every 5 (five) minutes as needed for chest pain.     NONFORMULARY OR COMPOUNDED ITEM Apply 1 Application topically daily as needed (rash, itching). Triamcinolone compounded with unknown     oxyCODONE (ROXICODONE) 5 MG immediate release tablet Take 1 tablet (5 mg total) by mouth every 4 (four) hours as needed for severe pain. 30 tablet 0   Polyethylene Glycol 400 (BLINK TEARS) 0.25 % SOLN Place 1 drop into both eyes 3 (three) times daily as needed (Dry eyes).     No current facility-administered medications on file prior to visit.    Allergies:  Allergies  Allergen Reactions   Aspirin Other (See Comments)    GI bleeding   Tylenol [Acetaminophen]  Other (See Comments)    Raised patient's liver enzymes    Vital Signs:  BP (!) 144/68   Pulse 82   Ht 5\' 5"  (1.651 m)   Wt 282 lb (127.9 kg)   SpO2 100%   BMI 46.93 kg/m    Neurological Exam: MENTAL STATUS including orientation to time, place, person, recent and remote memory, attention span and concentration, language, and fund of knowledge is normal.  Speech is not dysarthric.  CRANIAL NERVES:  Normal conjugate, extra-ocular eye movements in all directions of gaze.  No ptosis. Face is symmetric. Tongue is midline.   MOTOR:  Motor strength is 5/5 in all extremities, including bilateral finger abductors (improved).  Examination of the right leg was limited due knee pain.  No atrophy, fasciculations or abnormal movements.  No pronator drift.  Tone is normal.    COORDINATION/GAIT:  Gait appears antalgic, slow, unassisted.    Data: NCS/EMG of the arms 05/08/2020: Bilateral ulnar neuropathy with slowing across the elbow, predominantly demyelinating, mild-to-moderate and worse on the right. There is no  evidence of a cervical radiculopathy or carpal tunnel syndrome affecting either upper extremity  MRI brain wo contrast 02/20/2021: Partially empty and slightly expanded sella turcica. While these findings can reflect incidental anatomic variation, they can also be associated with idiopathic intracranial hypertension (pseudotumor cerebri). Mild generalized cerebellar atrophy. Otherwise unremarkable non-contrast MRI appearance of the brain for age. Mild mucosal thickening within the left maxillary sinus.  Large volume LP 03/04/2021: OP 15  MRI/A head 12/21/2022: 1. No evidence of an acute intracranial abnormality. 2. Partially empty and mildly enlarged sella turcica. This finding can reflect incidental anatomic variation, or alternatively, it can be associated with idiopathic intracranial hypertension (pseudotumor cerebri). 3. Mild cerebellar atrophy, similar to the prior brain MRI of 02/20/2021. 4. Mild mucosal thickening within the right frontal sinus.  1. No intracranial large vessel occlusion or proximal high-grade arterial stenosis. 2. 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   Thank you for allowing me to participate in patient's care.  If I can answer any additional questions, I would be pleased to do so.    Sincerely,    Zendaya Groseclose K. Allena Katz, DO

## 2023-01-05 ENCOUNTER — Encounter: Payer: Self-pay | Admitting: Physical Medicine and Rehabilitation

## 2023-01-05 ENCOUNTER — Encounter: Payer: 59 | Attending: Physical Medicine and Rehabilitation | Admitting: Physical Medicine and Rehabilitation

## 2023-01-05 VITALS — BP 156/75 | HR 76 | Ht 65.0 in | Wt 284.0 lb

## 2023-01-05 DIAGNOSIS — E1142 Type 2 diabetes mellitus with diabetic polyneuropathy: Secondary | ICD-10-CM | POA: Diagnosis not present

## 2023-01-05 DIAGNOSIS — G894 Chronic pain syndrome: Secondary | ICD-10-CM | POA: Diagnosis present

## 2023-01-05 DIAGNOSIS — M069 Rheumatoid arthritis, unspecified: Secondary | ICD-10-CM

## 2023-01-05 DIAGNOSIS — I10 Essential (primary) hypertension: Secondary | ICD-10-CM | POA: Diagnosis not present

## 2023-01-05 DIAGNOSIS — Z794 Long term (current) use of insulin: Secondary | ICD-10-CM

## 2023-01-05 DIAGNOSIS — K580 Irritable bowel syndrome with diarrhea: Secondary | ICD-10-CM

## 2023-01-05 NOTE — Progress Notes (Signed)
Subjective:    Patient ID: Connie Hoffman, female    DOB: Mar 10, 1961, 62 y.o.   MRN: [id number]  HPI [name] is a 61 year old woman who presents to establish care for chronic pain 2/2 OA and psoriatic arthritis  1) Chronic pain 2/2 OA and psoriatic arthritis -she does not take anything for pain -she does not want to develop severe deformities -pain is constant  2) HTN -blood pressure elevated to 156/75  Pain Inventory Average Pain 7 Pain Right Now 7 My pain is constant and aching  In the last 24 hours, has pain interfered with the following? General activity 5 Relation with others 5 Enjoyment of life 3 What TIME of day is your pain at its worst? evening Sleep (in general) Poor  Pain is worse with: walking and standing Pain improves with: rest, heat/ice, and TENS Relief from Meds:  no pain meds  walk without assistance how many minutes can you walk? 2 ability to climb steps?  no do you drive?  yes Do you have any goals in this area?  yes  retired  trouble walking spasms confusion  CT/MRI  New patient evaluation    Family History  Problem Relation Age of Onset   Diabetes Mother    Hypertension Mother    Heart Problems Mother    Congestive Heart Failure Mother    Diabetes Father    Hypertension Father    Psoriasis Father    Stroke Sister    Heart Problems Sister    Seizures Sister    Prostate cancer Brother    Diabetes Brother    Asthma Daughter    Healthy Daughter    Healthy Son    Healthy Son    Social History   Socioeconomic History   Marital status: Married    Spouse name: Not on file   Number of children: Not on file   Years of education: Not on file   Highest education level: Not on file  Occupational History   Not on file  Tobacco Use   Smoking status: Never    Passive exposure: Past   Smokeless tobacco: Never  Vaping Use   Vaping status: Never Used  Substance and Sexual Activity   Alcohol use: Never   Drug use: Never    Sexual activity: Yes    Birth control/protection: Surgical, None  Other Topics Concern   Not on file  Social History Narrative   Right handed    Lives with husband and 1 daughters    Social Determinants of Health   Financial Resource Strain: Low Risk  (06/29/2021)   Received from [facility]   Overall Financial Resource Strain (CARDIA)    Difficulty of Paying Living Expenses: Not hard at all  Food Insecurity: Low Risk  (10/06/2022)   [facility]   Hunger Vital Sign    Worried About Running Out of Food in the Last Year: Never true    Ran Out of Food in the Last Year: Never true  Transportation Needs: No Transportation Needs (10/06/2022)   [facility]   Transportation    In the past 12 months, has lack of reliable transportation kept you from medical appointments, meetings, work or from getting things needed for daily living? : No  Recent Concern: Transportation Needs - Unmet Transportation Needs (07/12/2022)   Received from [facility]    In the past 12 months, has lack of  reliable transportation kept you from medical appointments, meetings, work or from getting things needed for daily living? : Yes  Physical Activity: Inactive (06/29/2021)   Received [facility]   Exercise Vital Sign    Days of Exercise per Week: 0 days    Minutes of Exercise per Session: 10 min  Stress: No Stress Concern Present (06/29/2021)   Received from [facility] - Occupational Stress Questionnaire    Feeling of Stress : Not at all  Social Connections: Unknown (08/27/2021)   Received from [facility], [facility]   Social Network    Social Network: Not on file   Past Surgical History:  Procedure Laterality Date   ABDOMINAL HYSTERECTOMY     ARTHRODESIS FOOT WITH WEIL OSTEOTOMY Right 03/26/2022   Procedure: GASTROCNEMIUS  RECESSION, CALCANEAL EXOSTECTOMY;  Surgeon: Terance Hart, MD;  Location: [facility] OR;  Service: Orthopedics;  Laterality: Right;   BACK SURGERY  05/18/2019   L4 and L5   CARDIAC CATHETERIZATION     CHOLECYSTECTOMY     CYSTECTOMY  07/2017   FOOT SURGERY Right 05/08/2022   JOINT REPLACEMENT Left    left knee   LAPAROSCOPIC ENDOMETRIOSIS FULGURATION  2011   LAPAROSCOPIC GASTRIC SLEEVE RESECTION  2018   LAPAROSCOPIC OOPHERECTOMY Right    LUMBAR EPIDURAL INJECTION  04/13/2018   MINOR HARDWARE REMOVAL Right 07/07/2022   Procedure: RIGHT ANKLE DEEP HARDWARE REMOVAL;  Surgeon: Terance Hart, MD;  Location: [facility] OR;  Service: Orthopedics;  Laterality: Right;   REVISION TOTAL KNEE ARTHROPLASTY Left 12/2017   TENOLYSIS Right 03/26/2022   Procedure: RIGHT ACHILLES TENDON RECONSTRUCTION AND DEBRIDEMENT;  Surgeon: Terance Hart, MD;  Location: [facility] OR;  Service: Orthopedics;  Laterality: Right;  LENGTH OF SURGERY: 120 MINUTES   TOTAL SHOULDER ARTHROPLASTY Bilateral    TRIGGER FINGER RELEASE Bilateral 2023   bilateral 3rd digit   TUBAL LIGATION     postpartum   ULNAR NERVE REPAIR Bilateral 2023   Past Medical History:  Diagnosis Date   Anemia    Anxiety    Chest pain syndrome    Chronic stable angina    DDD (degenerative disc disease), lumbar    Gastritis    Headache    Hematuria    resolved after removing ASA, only on Eliquis now   History of hiatal hernia    Hypertension    Intestinal abscess    MRSA infection    After knee surgery   Observed seizure-like activity (HCC)    Pre-diabetes    Precordial pain    Psoriatic arthritis (HCC)    Pulmonary embolism (HCC)    Bilateral lungs   Refusal of blood transfusions as patient is Jehovah's Witness    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CC)    Sacroiliac pain    Seizure (HCC)    Spinal stenosis    Thyroid nodule    There were no vitals taken for this visit.  Opioid Risk Score:   Fall Risk Score:  `1  Depression screen PHQ  2/9     05/22/2020    1:21 PM 01/31/2020    8:23 AM 12/04/2019    1:49 PM  Depression screen PHQ 2/9  Decreased Interest 0  2  Down, Depressed, Hopeless 0 0   PHQ - 2 Score 0 0 2  Altered sleeping 0    Tired, decreased energy 3  2  Change in appetite 0 0   Feeling bad  or failure about yourself  0 0 0  Trouble concentrating 0    Moving slowly or fidgety/restless 0    Suicidal thoughts 0 0 0  PHQ-9 Score 3       Review of Systems  Constitutional:        Skin rash  Respiratory:  Positive for shortness of breath.   All other systems reviewed and are negative.      Objective:   Physical Exam Gen: no distress, normal appearing HEENT: oral mucosa pink and moist, NCAT Cardio: Reg rate Chest: normal effort, normal rate of breathing Abd: soft, non-distended Ext: no edema Psych: pleasant, normal affect Skin: intact Neuro: Alert and oriented x3 MSK: diffuse tenderness to joints       Assessment & Plan:  1) Chronic Pain Syndrome secondary to OA and psoriatic arthritis -discussed that she does not like pain medications -food allergy testing ordered -continue tens unit  Turmeric to reduce inflammation--can be used in cooking or taken as a supplement.  Benefits of turmeric:  -Highly anti-inflammatory  -Increases antioxidants  -Improves memory, attention, brain disease  -Lowers risk of heart disease  -May help prevent cancer  -Decreases pain  -Alleviates depression  -Delays aging and decreases risk of chronic disease  -Consume with black pepper to increase absorption    Turmeric Milk Recipe:  1 cup milk  1 tsp turmeric  1 tsp cinnamon  1 tsp grated ginger (optional)  Black pepper (boosts the anti-inflammatory properties of turmeric).  1 tsp honey   Prescribing Home Zynex NexWave Stimulator Device and supplies as needed. IFC, NMES and TENS medically necessary Treatment Rx: Daily @ 30-40 minutes per treatment PRN. Zynex NexWave only, no  substitutions. Treatment Goals: 1) To reduce and/or eliminate pain 2) To improve functional capacity and Activities of daily living 3) To reduce or prevent the need for oral medications 4) To improve circulation in the injured region 5) To decrease or prevent muscle spasm and muscle atrophy 6) To provide a self-management tool to the patient The patient has not sufficiently improved with conservative care. Numerous studies indexed by Medline and PubMed.gov have shown Neuromuscular, Interferential, and TENS stimulators to reduce pain, improve function, and reduce medication use in injured patients. Continued use of this evidence based, safe, drug free treatment is both reasonable and medically necessary at this time.   -Discussed current symptoms of pain and history of pain.  -Discussed benefits of exercise in reducing pain. -Discussed following foods that may reduce pain: 1) Ginger (especially studied for arthritis)- reduce leukotriene production to decrease inflammation 2) Blueberries- high in phytonutrients that decrease inflammation 3) Salmon- marine omega-3s reduce joint swelling and pain 4) Pumpkin seeds- reduce inflammation 5) dark chocolate- reduces inflammation 6) turmeric- reduces inflammation 7) tart cherries - reduce pain and stiffness 8) extra virgin olive oil - its compound olecanthal helps to block prostaglandins  9) chili peppers- can be eaten or applied topically via capsaicin 10) mint- helpful for headache, muscle aches, joint pain, and itching 11) garlic- reduces inflammation  Link to further information on diet for chronic pain: http://www.bray.com/   2) Diarrhea: -recommend coconut  3) HTN: -BP is 156/75 today.  -Advised checking BP daily at home and logging results to bring into follow-up appointment with PCP and myself. -Reviewed BP meds today.  -Advised regarding healthy foods that can help lower  blood pressure and provided with a list: 1) citrus foods- high in vitamins and minerals 2) salmon and other fatty fish - reduces inflammation and  oxylipins 3) swiss chard (leafy green)- high level of nitrates 4) pumpkin seeds- one of the best natural sources of magnesium 5) Beans and lentils- high in fiber, magnesium, and potassium 6) Berries- high in flavonoids 7) Amaranth (whole grain, can be cooked similarly to rice and oats)- high in magnesium and fiber 8) Pistachios- even more effective at reducing BP than other nuts 9) Carrots- high in phenolic compounds that relax blood vessels and reduce inflammation 10) Celery- contain phthalides that relax tissues of arterial walls 11) Tomatoes- can also improve cholesterol and reduce risk of heart disease 12) Broccoli- good source of magnesium, calcium, and potassium 13) Greek yogurt: high in potassium and calcium 14) Herbs and spices: Celery seed, cilantro, saffron, lemongrass, black cumin, ginseng, cinnamon, cardamom, sweet basil, and ginger 15) Chia and flax seeds- also help to lower cholesterol and blood sugar 16) Beets- high levels of nitrates that relax blood vessels  17) spinach and bananas- high in potassium  -Provided lise of supplements that can help with hypertension:  1) magnesium: one high quality brand is Bioptemizers since it contains all 7 types of magnesium, otherwise over the counter magnesium gluconate 400mg  is a good option 2) B vitamins 3) vitamin D 4) potassium 5) CoQ10 6) L-arginine 7) Vitamin C 8) Beetroot -Educated that goal BP is 120/80. -Made goal to incorporate some of the above foods into diet.    4) Peripheral neuropathy: -Discussed Qutenza as an option for neuropathic pain control. Discussed that this is a capsaicin patch, stronger than capsaicin cream. Discussed that it is currently approved for diabetic peripheral neuropathy and post-herpetic neuralgia, but that it has also shown benefit in treating other  forms of neuropathy. Provided patient with link to site to learn more about the patch: https://www.clark.biz/. Discussed that the patch would be placed in office and benefits usually last 3 months. Discussed that unintended exposure to capsaicin can cause severe irritation of eyes, mucous membranes, respiratory tract, and skin, but that Qutenza is a local treatment and does not have the systemic side effects of other nerve medications. Discussed that there may be pain, itching, erythema, and decreased sensory function associated with the application of Qutenza. Side effects usually subside within 1 week. A cold pack of analgesic medications can help with these side effects. Blood pressure can also be increased due to pain associated with administration of the patch.   HgbA1c reviewed and is 7.3  -recommended avoiding processed foods, bread, pasta, rice

## 2023-01-05 NOTE — Patient Instructions (Signed)
Turmeric to reduce inflammation--can be used in cooking or taken as a supplement.  Benefits of turmeric:  -Highly anti-inflammatory  -Increases antioxidants  -Improves memory, attention, brain disease  -Lowers risk of heart disease  -May help prevent cancer  -Decreases pain  -Alleviates depression  -Delays aging and decreases risk of chronic disease  -Consume with black pepper to increase absorption    Turmeric Milk Recipe:  1 cup milk  1 tsp turmeric  1 tsp cinnamon  1 tsp grated ginger (optional)  Black pepper (boosts the anti-inflammatory properties of turmeric).  1 tsp honey   HTN: -Advised checking BP daily at home and logging results to bring into follow-up appointment with PCP and myself. -Reviewed BP meds today.  -Advised regarding healthy foods that can help lower blood pressure and provided with a list: 1) citrus foods- high in vitamins and minerals 2) salmon and other fatty fish - reduces inflammation and oxylipins 3) swiss chard (leafy green)- high level of nitrates 4) pumpkin seeds- one of the best natural sources of magnesium 5) Beans and lentils- high in fiber, magnesium, and potassium 6) Berries- high in flavonoids 7) Amaranth (whole grain, can be cooked similarly to rice and oats)- high in magnesium and fiber 8) Pistachios- even more effective at reducing BP than other nuts 9) Carrots- high in phenolic compounds that relax blood vessels and reduce inflammation 10) Celery- contain phthalides that relax tissues of arterial walls 11) Tomatoes- can also improve cholesterol and reduce risk of heart disease 12) Broccoli- good source of magnesium, calcium, and potassium 13) Greek yogurt: high in potassium and calcium 14) Herbs and spices: Celery seed, cilantro, saffron, lemongrass, black cumin, ginseng, cinnamon, cardamom, sweet basil, and ginger 15) Chia and flax seeds- also help to lower cholesterol and blood sugar 16) Beets- high levels of  nitrates that relax blood vessels  17) spinach and bananas- high in potassium  -Provided lise of supplements that can help with hypertension:  1) magnesium: one high quality brand is Bioptemizers since it contains all 7 types of magnesium, otherwise over the counter magnesium gluconate 400mg  is a good option 2) B vitamins 3) vitamin D 4) potassium 5) CoQ10 6) L-arginine 7) Vitamin C 8) Beetroot -Educated that goal BP is 120/80. -Made goal to incorporate some of the above foods into diet.

## 2023-01-06 ENCOUNTER — Ambulatory Visit: Payer: 59 | Admitting: Neurology

## 2023-01-07 LAB — ALLERGENS (22) FOODS IGG
Casein IgG: 16.1 ug/mL — ABNORMAL HIGH (ref 0.0–1.9)
Cheese, Mold Type IgG: 24 ug/mL — ABNORMAL HIGH (ref 0.0–1.9)
Chicken IgG: <2 ug/mL (ref 0.0–1.9)
Chocolate/Cacao IgG: 5.5 ug/mL — ABNORMAL HIGH (ref 0.0–1.9)
Coffee IgG: 3 ug/mL — ABNORMAL HIGH (ref 0.0–1.9)
Corn IgG: 7.2 ug/mL — ABNORMAL HIGH (ref 0.0–1.9)
Egg, Whole IgG: <2 ug/mL (ref 0.0–1.9)
Green Bean IgG: <2 ug/mL (ref 0.0–1.9)
Haddock IgG: <2 ug/mL (ref 0.0–1.9)
Lamb IgG: <2 ug/mL (ref 0.0–1.9)
Oat IgG: 10 ug/mL — ABNORMAL HIGH (ref 0.0–1.9)
Onion IgG: 3.1 ug/mL — ABNORMAL HIGH (ref 0.0–1.9)
Peanut IgG: <2 ug/mL (ref 0.0–1.9)
Pork IgG: 2.9 ug/mL — ABNORMAL HIGH (ref 0.0–1.9)
Potato, White, IgG: <2 ug/mL (ref 0.0–1.9)
Rye IgG: 10.2 ug/mL — ABNORMAL HIGH (ref 0.0–1.9)
Shrimp IgG: <2 ug/mL (ref 0.0–1.9)
Soybean IgG: <2 ug/mL (ref 0.0–1.9)
Tomato IgG: <2 ug/mL (ref 0.0–1.9)
Wheat IgG: 8 ug/mL — ABNORMAL HIGH (ref 0.0–1.9)
Yeast IgG: 7.1 ug/mL — ABNORMAL HIGH (ref 0.0–1.9)

## 2023-01-08 ENCOUNTER — Encounter (HOSPITAL_BASED_OUTPATIENT_CLINIC_OR_DEPARTMENT_OTHER): Payer: 59 | Admitting: Physical Medicine and Rehabilitation

## 2023-01-08 DIAGNOSIS — G894 Chronic pain syndrome: Secondary | ICD-10-CM

## 2023-01-09 NOTE — Progress Notes (Signed)
Subjective:    Patient ID: Connie Hoffman, female    DOB: Apr 12, 1961, 62 y.o.   MRN: [id number]  HPI An audio/video tele-health visit is felt to be the most appropriate encounter for this patient at this time. This is a follow up tele-visit via phone. The patient is at home. MD is at office. Prior to scheduling this appointment, our staff discussed the limitations of evaluation and management by telemedicine and the availability of in-person appointments. The patient expressed understanding and agreed to proceed.  Connie Hoffman is a 62 year old woman who presents to establish care for chronic pain 2/2 OA and psoriatic arthritis  1) Chronic pain 2/2 OA and psoriatic arthritis -she does not take anything for pain -she does not want to develop severe deformities -pain is constant -she is interested in hearing results of food sensitivity testing  2) HTN -blood pressure elevated to 156/75  Pain Inventory Average Pain 7 Pain Right Now 7 My pain is constant and aching  In the last 24 hours, has pain interfered with the following? General activity 5 Relation with others 5 Enjoyment of life 3 What TIME of day is your pain at its worst? evening Sleep (in general) Poor  Pain is worse with: walking and standing Pain improves with: rest, heat/ice, and TENS Relief from Meds:  no pain meds  walk without assistance how many minutes can you walk? 2 ability to climb steps?  no do you drive?  yes Do you have any goals in this area?  yes  retired  trouble walking spasms confusion  CT/MRI  New patient evaluation    Family History  Problem Relation Age of Onset   Diabetes Mother    Hypertension Mother    Heart Problems Mother    Congestive Heart Failure Mother    Diabetes Father    Hypertension Father    Psoriasis Father    Stroke Sister    Heart Problems Sister    Seizures Sister    Prostate cancer Brother    Diabetes Brother    Asthma Daughter    Healthy Daughter     Healthy Son    Healthy Son    Social History   Socioeconomic History   Marital status: Married    Spouse name: Not on file   Number of children: Not on file   Years of education: Not on file   Highest education level: Not on file  Occupational History   Not on file  Tobacco Use   Smoking status: Never    Passive exposure: Past   Smokeless tobacco: Never  Vaping Use   Vaping status: Never Used  Substance and Sexual Activity   Alcohol use: Never   Drug use: Never   Sexual activity: Yes    Birth control/protection: Surgical, None  Other Topics Concern   Not on file  Social History Narrative   Right handed    Lives with husband and 1 daughters    Social Determinants of Health   Financial Resource Strain: Low Risk  (01/05/2023)   Received from [facility]   Overall Financial Resource Strain (CARDIA)    Difficulty of Paying Living Expenses: Not hard at all  Food Insecurity: No Food Insecurity (01/05/2023)   Received from [facility]   Hunger Vital Sign    Worried About Running Out of Food in the Last Year: Never true    Ran Out of Food in the Last Year: Never true  Transportation Needs: No Transportation Needs (  01/05/2023)   Received from [facility] - Transportation    Lack of Transportation (Medical): No    Lack of Transportation (Non-Medical): No  Physical Activity: Unknown (01/05/2023)   Received from [facility]   Exercise Vital Sign    Days of Exercise per Week: 0 days    Minutes of Exercise per Session: Not on file  Stress: No Stress Concern Present (01/05/2023)   Received from [facility] - Occupational Stress Questionnaire    Feeling of Stress : Only a little  Social Connections: Socially Integrated (01/05/2023)   Received from [facility]   Social Network    How would you rate your social network (family, work, friends)?: Good participation with social networks   Past Surgical History:   Procedure Laterality Date   ABDOMINAL HYSTERECTOMY     ARTHRODESIS FOOT WITH WEIL OSTEOTOMY Right 03/26/2022   Procedure: GASTROCNEMIUS RECESSION, CALCANEAL EXOSTECTOMY;  Surgeon: Terance Hart, MD;  Location: [facility] OR;  Service: Orthopedics;  Laterality: Right;   BACK SURGERY  05/18/2019   L4 and L5   CARDIAC CATHETERIZATION     CHOLECYSTECTOMY     CYSTECTOMY  07/2017   FOOT SURGERY Right 05/08/2022   JOINT REPLACEMENT Left    left knee   LAPAROSCOPIC ENDOMETRIOSIS FULGURATION  2011   LAPAROSCOPIC GASTRIC SLEEVE RESECTION  2018   LAPAROSCOPIC OOPHERECTOMY Right    LUMBAR EPIDURAL INJECTION  04/13/2018   MINOR HARDWARE REMOVAL Right 07/07/2022   Procedure: RIGHT ANKLE DEEP HARDWARE REMOVAL;  Surgeon: Terance Hart, MD;  Location: [facility] OR;  Service: Orthopedics;  Laterality: Right;   REVISION TOTAL KNEE ARTHROPLASTY Left 12/2017   TENOLYSIS Right 03/26/2022   Procedure: RIGHT ACHILLES TENDON RECONSTRUCTION AND DEBRIDEMENT;  Surgeon: Terance Hart, MD;  Location: [facility] OR;  Service: Orthopedics;  Laterality: Right;  LENGTH OF SURGERY: 120 MINUTES   TOTAL SHOULDER ARTHROPLASTY Bilateral    TRIGGER FINGER RELEASE Bilateral 2023   bilateral 3rd digit   TUBAL LIGATION     postpartum   ULNAR NERVE REPAIR Bilateral 2023   Past Medical History:  Diagnosis Date   Anemia    Anxiety    Chest pain syndrome    Chronic stable angina    DDD (degenerative disc disease), lumbar    Gastritis    Headache    Hematuria    resolved after removing ASA, only on Eliquis now   History of hiatal hernia    Hypertension    Intestinal abscess    MRSA infection    After knee surgery   Observed seizure-like activity (HCC)    Pre-diabetes    Precordial pain    Psoriatic arthritis (HCC)    Pulmonary embolism (HCC)    Bilateral lungs   Refusal of blood transfusions as patient is Jehovah's Witness    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CC)    Sacroiliac pain    Seizure (HCC)    Spinal stenosis     Thyroid nodule    There were no vitals taken for this visit.  Opioid Risk Score:   Fall Risk Score:  `1  Depression screen [facility] 2/9     01/05/2023   12:51 PM 05/22/2020    1:21 PM 01/31/2020    8:23 AM 12/04/2019    1:49 PM  Depression screen PHQ 2/9  Decreased Interest 0 0  2  Down, Depressed, Hopeless 0 0 0   PHQ - 2 Score 0 0 0 2  Altered sleeping 1 0    Tired, decreased energy 3 3  2   Change in appetite 2 0 0   Feeling bad or failure about yourself  0 0 0 0  Trouble concentrating 1 0    Moving slowly or fidgety/restless 0 0    Suicidal thoughts 0 0 0 0  PHQ-9 Score 7 3       Review of Systems  Constitutional:        Skin rash  Respiratory:  Positive for shortness of breath.   All other systems reviewed and are negative.      Objective:   Physical Exam Gen: no distress, normal appearing HEENT: oral mucosa pink and moist, NCAT Cardio: Reg rate Chest: normal effort, normal rate of breathing Abd: soft, non-distended Ext: no edema Psych: pleasant, normal affect Skin: intact Neuro: Alert and oriented x3 MSK: diffuse tenderness to joints       Assessment & Plan:  1) Chronic Pain Syndrome secondary to OA and psoriatic arthritis -discussed that she does not like pain medications -food allergy testing ordered -continue tens unit -discussed food sensitivity results and recommended 4 week elimination diet of the foods to which she is producing antibodies against  Turmeric to reduce inflammation--can be used in cooking or taken as a supplement.  Benefits of turmeric:  -Highly anti-inflammatory  -Increases antioxidants  -Improves memory, attention, brain disease  -Lowers risk of heart disease  -May help prevent cancer  -Decreases pain  -Alleviates depression  -Delays aging and decreases risk of chronic disease  -Consume with black pepper to increase absorption    Turmeric Milk Recipe:  1 cup milk  1 tsp turmeric  1 tsp cinnamon  1 tsp  grated ginger (optional)  Black pepper (boosts the anti-inflammatory properties of turmeric).  1 tsp honey   Prescribing Home Zynex NexWave Stimulator Device and supplies as needed. IFC, NMES and TENS medically necessary Treatment Rx: Daily @ 30-40 minutes per treatment PRN. Zynex NexWave only, no substitutions. Treatment Goals: 1) To reduce and/or eliminate pain 2) To improve functional capacity and Activities of daily living 3) To reduce or prevent the need for oral medications 4) To improve circulation in the injured region 5) To decrease or prevent muscle spasm and muscle atrophy 6) To provide a self-management tool to the patient The patient has not sufficiently improved with conservative care. Numerous studies indexed by Medline and PubMed.gov have shown Neuromuscular, Interferential, and TENS stimulators to reduce pain, improve function, and reduce medication use in injured patients. Continued use of this evidence based, safe, drug free treatment is both reasonable and medically necessary at this time.   -Discussed current symptoms of pain and history of pain.  -Discussed benefits of exercise in reducing pain. -Discussed following foods that may reduce pain: 1) Ginger (especially studied for arthritis)- reduce leukotriene production to decrease inflammation 2) Blueberries- high in phytonutrients that decrease inflammation 3) Salmon- marine omega-3s reduce joint swelling and pain 4) Pumpkin seeds- reduce inflammation 5) dark chocolate- reduces inflammation 6) turmeric- reduces inflammation 7) tart cherries - reduce pain and stiffness 8) extra virgin olive oil - its compound olecanthal helps to block prostaglandins  9) chili peppers- can be eaten or applied topically via capsaicin 10) mint- helpful for headache, muscle aches, joint pain, and itching 11) garlic- reduces inflammation  Link to further information on diet for chronic pain:  http://www.bray.com/   2) Diarrhea: -recommend coconut  3) HTN: -BP is 156/75 today.  -Advised checking BP daily at home  and logging results to bring into follow-up appointment with PCP and myself. -Reviewed BP meds today.  -Advised regarding healthy foods that can help lower blood pressure and provided with a list: 1) citrus foods- high in vitamins and minerals 2) salmon and other fatty fish - reduces inflammation and oxylipins 3) swiss chard (leafy green)- high level of nitrates 4) pumpkin seeds- one of the best natural sources of magnesium 5) Beans and lentils- high in fiber, magnesium, and potassium 6) Berries- high in flavonoids 7) Amaranth (whole grain, can be cooked similarly to rice and oats)- high in magnesium and fiber 8) Pistachios- even more effective at reducing BP than other nuts 9) Carrots- high in phenolic compounds that relax blood vessels and reduce inflammation 10) Celery- contain phthalides that relax tissues of arterial walls 11) Tomatoes- can also improve cholesterol and reduce risk of heart disease 12) Broccoli- good source of magnesium, calcium, and potassium 13) Greek yogurt: high in potassium and calcium 14) Herbs and spices: Celery seed, cilantro, saffron, lemongrass, black cumin, ginseng, cinnamon, cardamom, sweet basil, and ginger 15) Chia and flax seeds- also help to lower cholesterol and blood sugar 16) Beets- high levels of nitrates that relax blood vessels  17) spinach and bananas- high in potassium  -Provided lise of supplements that can help with hypertension:  1) magnesium: one high quality brand is Bioptemizers since it contains all 7 types of magnesium, otherwise over the counter magnesium gluconate 400mg  is a good option 2) B vitamins 3) vitamin D 4) potassium 5) CoQ10 6) L-arginine 7) Vitamin C 8) Beetroot -Educated that goal BP is 120/80. -Made goal to incorporate some  of the above foods into diet.    4) Peripheral neuropathy: -Discussed Qutenza as an option for neuropathic pain control. Discussed that this is a capsaicin patch, stronger than capsaicin cream. Discussed that it is currently approved for diabetic peripheral neuropathy and post-herpetic neuralgia, but that it has also shown benefit in treating other forms of neuropathy. Provided patient with link to site to learn more about the patch: https://www.clark.biz/. Discussed that the patch would be placed in office and benefits usually last 3 months. Discussed that unintended exposure to capsaicin can cause severe irritation of eyes, mucous membranes, respiratory tract, and skin, but that Qutenza is a local treatment and does not have the systemic side effects of other nerve medications. Discussed that there may be pain, itching, erythema, and decreased sensory function associated with the application of Qutenza. Side effects usually subside within 1 week. A cold pack of analgesic medications can help with these side effects. Blood pressure can also be increased due to pain associated with administration of the patch.   HgbA1c reviewed and is 7.3  -recommended avoiding processed foods, bread, pasta, rice  7 minutes spent in discussing the results of her her food sensitivity testing and recommended 4 week elimination diet of the foods to which she is producing antibodies

## 2023-01-14 ENCOUNTER — Other Ambulatory Visit: Payer: Self-pay

## 2023-02-09 ENCOUNTER — Encounter: Payer: 59 | Attending: Physical Medicine and Rehabilitation | Admitting: Physical Medicine and Rehabilitation

## 2023-02-09 DIAGNOSIS — G894 Chronic pain syndrome: Secondary | ICD-10-CM | POA: Insufficient documentation

## 2023-02-09 DIAGNOSIS — E1142 Type 2 diabetes mellitus with diabetic polyneuropathy: Secondary | ICD-10-CM | POA: Insufficient documentation

## 2023-02-12 ENCOUNTER — Encounter: Payer: Self-pay | Admitting: Physical Medicine and Rehabilitation

## 2023-02-15 ENCOUNTER — Encounter (HOSPITAL_BASED_OUTPATIENT_CLINIC_OR_DEPARTMENT_OTHER): Payer: 59 | Admitting: Physical Medicine and Rehabilitation

## 2023-02-15 DIAGNOSIS — G894 Chronic pain syndrome: Secondary | ICD-10-CM

## 2023-02-15 MED ORDER — TOPIRAMATE 25 MG PO TABS: 25.0000 mg | ORAL_TABLET | Freq: Every evening | ORAL | 3 refills | Status: DC | PRN

## 2023-02-15 NOTE — Progress Notes (Signed)
Subjective:    Patient ID: Connie Hoffman, female    DOB: [birth year], 62 y.o.   MRN: [id number]  HPI An audio/video tele-health visit is felt to be the most appropriate encounter for this patient at this time. This is a follow up tele-visit via phone. The patient is at home. MD is at office. Prior to scheduling this appointment, our staff discussed the limitations of evaluation and management by telemedicine and the availability of in-person appointments. The patient expressed understanding and agreed to proceed.    Ms. Gossman is a 62 year old woman who presents to establish care for chronic pain 2/2 OA and psoriatic arthritis  1) Chronic pain 2/2 OA and psoriatic arthritis -failed cymbalta  -still takes gabapentin but it is not very helpful for pain, more for seizures -she does not take anything for pain -she does not want to develop severe deformities -pain is constant -she is interested in hearing results of food sensitivity testing -hand pain has been severe -she used the tens machine last night and it helps a little -the weather change has worsened her pain  2) HTN -blood pressure elevated to 156/75  Pain Inventory Average Pain 7 Pain Right Now 7 My pain is constant and aching  In the last 24 hours, has pain interfered with the following? General activity 5 Relation with others 5 Enjoyment of life 3 What TIME of day is your pain at its worst? evening Sleep (in general) Poor  Pain is worse with: walking and standing Pain improves with: rest, heat/ice, and TENS Relief from Meds:  no pain meds  walk without assistance how many minutes can you walk? 2 ability to climb steps?  no do you drive?  yes Do you have any goals in this area?  yes  retired  trouble walking spasms confusion  CT/MRI  New patient evaluation    Family History  Problem Relation Age of Onset   Diabetes Mother    Hypertension Mother    Heart Problems Mother    Congestive Heart  Failure Mother    Diabetes Father    Hypertension Father    Psoriasis Father    Stroke Sister    Heart Problems Sister    Seizures Sister    Prostate cancer Brother    Diabetes Brother    Asthma Daughter    Healthy Daughter    Healthy Son    Healthy Son    Social History   Socioeconomic History   Marital status: Married    Spouse name: Not on file   Number of children: Not on file   Years of education: Not on file   Highest education level: Not on file  Occupational History   Not on file  Tobacco Use   Smoking status: Never    Passive exposure: Past   Smokeless tobacco: Never  Vaping Use   Vaping status: Never Used  Substance and Sexual Activity   Alcohol use: Never   Drug use: Never   Sexual activity: Yes    Birth control/protection: Surgical, None  Other Topics Concern   Not on file  Social History Narrative   Right handed    Lives with husband and 1 daughters    Social Determinants of Health   Financial Resource Strain: Low Risk  (01/05/2023)   Received from [facility]   Overall Financial Resource Strain (CARDIA)    Difficulty of Paying Living Expenses: Not hard at all  Food Insecurity: Low Risk  (01/11/2023)  Received from [facility]   Hunger Vital Sign    Worried About Running Out of Food in the Last Year: Never true    Ran Out of Food in the Last Year: Never true  Transportation Needs: No Transportation Needs (01/11/2023)   Received from [facility]    In the past 12 months, has lack of reliable transportation kept you from medical appointments, meetings, work or from getting things needed for daily living? : No  Physical Activity: Unknown (01/05/2023)   Received from [facility]   Exercise Vital Sign    Days of Exercise per Week: 0 days    Minutes of Exercise per Session: Not on file  Stress: No Stress Concern Present (01/05/2023)   Received from [facility]  Questionnaire    Feeling of Stress : Only a little  Social Connections: Socially Integrated (01/05/2023)   Received from [facility]   Social Network    How would you rate your social network (family, work, friends)?: Good participation with social networks   Past Surgical History:  Procedure Laterality Date   ABDOMINAL HYSTERECTOMY     ARTHRODESIS FOOT WITH WEIL OSTEOTOMY Right 03/26/2022   Procedure: GASTROCNEMIUS RECESSION, CALCANEAL EXOSTECTOMY;  Surgeon: Terance Hart, MD;  Location: [facility] OR;  Service: Orthopedics;  Laterality: Right;   BACK SURGERY  05/18/2019   L4 and L5   CARDIAC CATHETERIZATION     CHOLECYSTECTOMY     CYSTECTOMY  07/2017   FOOT SURGERY Right 05/08/2022   JOINT REPLACEMENT Left    left knee   LAPAROSCOPIC ENDOMETRIOSIS FULGURATION  2011   LAPAROSCOPIC GASTRIC SLEEVE RESECTION  2018   LAPAROSCOPIC OOPHERECTOMY Right    LUMBAR EPIDURAL INJECTION  04/13/2018   MINOR HARDWARE REMOVAL Right 07/07/2022   Procedure: RIGHT ANKLE DEEP HARDWARE REMOVAL;  Surgeon: Terance Hart, MD;  Location: [facility] OR;  Service: Orthopedics;  Laterality: Right;   REVISION TOTAL KNEE ARTHROPLASTY Left 12/2017   TENOLYSIS Right 03/26/2022   Procedure: RIGHT ACHILLES TENDON RECONSTRUCTION AND DEBRIDEMENT;  Surgeon: Terance Hart, MD;  Location: [facility] OR;  Service: Orthopedics;  Laterality: Right;  LENGTH OF SURGERY: 120 MINUTES   TOTAL SHOULDER ARTHROPLASTY Bilateral    TRIGGER FINGER RELEASE Bilateral 2023   bilateral 3rd digit   TUBAL LIGATION     postpartum   ULNAR NERVE REPAIR Bilateral 2023   Past Medical History:  Diagnosis Date   Anemia    Anxiety    Chest pain syndrome    Chronic stable angina    DDD (degenerative disc disease), lumbar    Gastritis    Headache    Hematuria    resolved after removing ASA, only on Eliquis now   History of hiatal hernia    Hypertension    Intestinal abscess    MRSA infection    After knee surgery   Observed  seizure-like activity (HCC)    Pre-diabetes    Precordial pain    Psoriatic arthritis (HCC)    Pulmonary embolism (HCC)    Bilateral lungs   Refusal of blood transfusions as patient is Jehovah's Witness    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CC)    Sacroiliac pain    Seizure (HCC)    Spinal stenosis    Thyroid nodule    There were no vitals taken for this visit.  Opioid Risk Score:   Fall Risk Score:  `1  Depression screen [facility] 2/9  01/05/2023   12:51 PM 05/22/2020    1:21 PM 01/31/2020    8:23 AM 12/04/2019    1:49 PM  Depression screen PHQ 2/9  Decreased Interest 0 0  2  Down, Depressed, Hopeless 0 0 0   PHQ - 2 Score 0 0 0 2  Altered sleeping 1 0    Tired, decreased energy 3 3  2   Change in appetite 2 0 0   Feeling bad or failure about yourself  0 0 0 0  Trouble concentrating 1 0    Moving slowly or fidgety/restless 0 0    Suicidal thoughts 0 0 0 0  PHQ-9 Score 7 3       Review of Systems  Constitutional:        Skin rash  Respiratory:  Positive for shortness of breath.   All other systems reviewed and are negative.      Objective:   Physical Exam Gen: no distress, normal appearing HEENT: oral mucosa pink and moist, NCAT Cardio: Reg rate Chest: normal effort, normal rate of breathing Abd: soft, non-distended Ext: no edema Psych: pleasant, normal affect Skin: intact Neuro: Alert and oriented x3 MSK: diffuse tenderness to joints     Assessment & Plan:  1) Chronic Pain Syndrome secondary to OA and psoriatic arthritis -discussed that she does not like pain medications -failed cymbalta and gabapentin -discussed topamax, prescribed 25mg  HS - -food allergy testing ordered -continue tens unit -discussed food sensitivity results and recommended 4 week elimination diet of the foods to which she is producing antibodies against  Turmeric to reduce inflammation--can be used in cooking or taken as a supplement.  Benefits of turmeric:  -Highly  anti-inflammatory  -Increases antioxidants  -Improves memory, attention, brain disease  -Lowers risk of heart disease  -May help prevent cancer  -Decreases pain  -Alleviates depression  -Delays aging and decreases risk of chronic disease  -Consume with black pepper to increase absorption    Turmeric Milk Recipe:  1 cup milk  1 tsp turmeric  1 tsp cinnamon  1 tsp grated ginger (optional)  Black pepper (boosts the anti-inflammatory properties of turmeric).  1 tsp honey    Prescribing Home Zynex NexWave Stimulator Device and supplies as needed. IFC, NMES and TENS medically necessary Treatment Rx: Daily @ 30-40 minutes per treatment PRN. Zynex NexWave only, no substitutions. Treatment Goals: 1) To reduce and/or eliminate pain 2) To improve functional capacity and Activities of daily living 3) To reduce or prevent the need for oral medications 4) To improve circulation in the injured region 5) To decrease or prevent muscle spasm and muscle atrophy 6) To provide a self-management tool to the patient The patient has not sufficiently improved with conservative care. Numerous studies indexed by Medline and PubMed.gov have shown Neuromuscular, Interferential, and TENS stimulators to reduce pain, improve function, and reduce medication use in injured patients. Continued use of this evidence based, safe, drug free treatment is both reasonable and medically necessary at this time.   -Discussed current symptoms of pain and history of pain.  -Discussed benefits of exercise in reducing pain. -Discussed following foods that may reduce pain: 1) Ginger (especially studied for arthritis)- reduce leukotriene production to decrease inflammation 2) Blueberries- high in phytonutrients that decrease inflammation 3) Salmon- marine omega-3s reduce joint swelling and pain 4) Pumpkin seeds- reduce inflammation 5) dark chocolate- reduces inflammation 6) turmeric- reduces inflammation 7) tart  cherries - reduce pain and stiffness 8) extra virgin olive oil - its compound olecanthal  helps to block prostaglandins  9) chili peppers- can be eaten or applied topically via capsaicin 10) mint- helpful for headache, muscle aches, joint pain, and itching 11) garlic- reduces inflammation  Link to further information on diet for chronic pain: http://www.bray.com/   2) Diarrhea: -recommend coconut  3) HTN: -Advised checking BP daily at home and logging results to bring into follow-up appointment with PCP and myself. -Reviewed BP meds today.  -Advised regarding healthy foods that can help lower blood pressure and provided with a list: 1) citrus foods- high in vitamins and minerals 2) salmon and other fatty fish - reduces inflammation and oxylipins 3) swiss chard (leafy green)- high level of nitrates 4) pumpkin seeds- one of the best natural sources of magnesium 5) Beans and lentils- high in fiber, magnesium, and potassium 6) Berries- high in flavonoids 7) Amaranth (whole grain, can be cooked similarly to rice and oats)- high in magnesium and fiber 8) Pistachios- even more effective at reducing BP than other nuts 9) Carrots- high in phenolic compounds that relax blood vessels and reduce inflammation 10) Celery- contain phthalides that relax tissues of arterial walls 11) Tomatoes- can also improve cholesterol and reduce risk of heart disease 12) Broccoli- good source of magnesium, calcium, and potassium 13) Greek yogurt: high in potassium and calcium 14) Herbs and spices: Celery seed, cilantro, saffron, lemongrass, black cumin, ginseng, cinnamon, cardamom, sweet basil, and ginger 15) Chia and flax seeds- also help to lower cholesterol and blood sugar 16) Beets- high levels of nitrates that relax blood vessels  17) spinach and bananas- high in potassium  -Provided lise of supplements that can help with hypertension:   1) magnesium: one high quality brand is Bioptemizers since it contains all 7 types of magnesium, otherwise over the counter magnesium gluconate 400mg  is a good option 2) B vitamins 3) vitamin D 4) potassium 5) CoQ10 6) L-arginine 7) Vitamin C 8) Beetroot -Educated that goal BP is 120/80. -Made goal to incorporate some of the above foods into diet.    4) Peripheral neuropathy: -Discussed Qutenza as an option for neuropathic pain control. Discussed that this is a capsaicin patch, stronger than capsaicin cream. Discussed that it is currently approved for diabetic peripheral neuropathy and post-herpetic neuralgia, but that it has also shown benefit in treating other forms of neuropathy. Provided patient with link to site to learn more about the patch: https://www.clark.biz/. Discussed that the patch would be placed in office and benefits usually last 3 months. Discussed that unintended exposure to capsaicin can cause severe irritation of eyes, mucous membranes, respiratory tract, and skin, but that Qutenza is a local treatment and does not have the systemic side effects of other nerve medications. Discussed that there may be pain, itching, erythema, and decreased sensory function associated with the application of Qutenza. Side effects usually subside within 1 week. A cold pack of analgesic medications can help with these side effects. Blood pressure can also be increased due to pain associated with administration of the patch.   HgbA1c reviewed and is 7.3  -recommended avoiding processed foods, bread, pasta, rice  5 minutes spent in discussion of her pain, trial of topamax 25mg  at night for pain/insomnia/decreasing appetite, recommended turmeric and using with black pepper to increase absorption

## 2023-03-09 NOTE — Progress Notes (Deleted)
Office Visit Note  Patient: Connie Hoffman             Date of Birth: 18-Apr-1961           MRN: [id number]             PCP: Kerin Salen, PA-C Referring: Kathaleen Bury* Visit Date: 03/23/2023 Occupation: @GUAROCC @  Subjective:    History of Present Illness: Connie Hoffman is a 62 y.o. female with history of psoriatic arthritis. She is taking Otezla 30 mg 1 tablet by mouth twice daily.   CBC and BMP updated on 01/18/23.  Activities of Daily Living:  Patient reports morning stiffness for *** {minute/hour:19697}.   Patient {ACTIONS;DENIES/REPORTS:21021675::"Denies"} nocturnal pain.  Difficulty dressing/grooming: {ACTIONS;DENIES/REPORTS:21021675::"Denies"} Difficulty climbing stairs: {ACTIONS;DENIES/REPORTS:21021675::"Denies"} Difficulty getting out of chair: {ACTIONS;DENIES/REPORTS:21021675::"Denies"} Difficulty using hands for taps, buttons, cutlery, and/or writing: {ACTIONS;DENIES/REPORTS:21021675::"Denies"}  No Rheumatology ROS completed.   PMFS History:  Patient Active Problem List   Diagnosis Date Noted   ABLA (acute blood loss anemia) 07/08/2022   T2DM (type 2 diabetes mellitus) (HCC) 07/08/2022   Hyperglycemia 07/08/2022   History of anxiety 07/08/2022   Hypertension 07/08/2022   Obesity, Class III, BMI 40-49.9 (morbid obesity) (HCC) 07/08/2022   Anxiety 07/08/2022   Pre-diabetes 07/08/2022   Right ankle pain 07/07/2022   Open wound of right foot 06/03/2022   Achilles tendonosis of right lower extremity 03/26/2022   Seizure (HCC) 11/03/2021   Diverticulitis of large intestine with abscess without bleeding 05/28/2021   History of pulmonary embolism 05/28/2021   Chronic anticoagulation 05/28/2021   Midline cystocele 05/23/2020   Pelvic mass 12/04/2019   DDD (degenerative disc disease), lumbar 01/21/2018   Primary osteoarthritis of both hands 01/21/2018   Primary osteoarthritis of both feet 01/21/2018   Psoriasis 12/01/2017    Psoriatic arthritis (HCC) 12/01/2017   High risk medication use 12/01/2017   S/P total knee replacement, 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   Thrombocytosis 12/01/2017   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    Past Medical History:  Diagnosis Date   Anemia    Anxiety    Chest pain syndrome    Chronic stable angina    DDD (degenerative disc disease), lumbar    Gastritis    Headache    Hematuria    resolved after removing ASA, only on Eliquis now   History of hiatal hernia    Hypertension    Intestinal abscess    MRSA infection    After knee surgery   Observed seizure-like activity (HCC)    Pre-diabetes    Precordial pain    Psoriatic arthritis (HCC)    Pulmonary embolism (HCC)    Bilateral lungs   Refusal of blood transfusions as patient is Jehovah's Witness    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CC)    Sacroiliac pain    Seizure (HCC)    Spinal stenosis    Thyroid nodule     Family History  Problem Relation Age of Onset   Diabetes Mother    Hypertension Mother    Heart Problems Mother    Congestive Heart Failure Mother    Diabetes Father    Hypertension Father    Psoriasis Father    Stroke Sister    Heart Problems Sister    Seizures Sister    Prostate cancer Brother    Diabetes Brother    Asthma Daughter    Healthy Daughter    Healthy Son    Healthy Son    Past Surgical History:  Procedure Laterality Date  ABDOMINAL HYSTERECTOMY     ARTHRODESIS FOOT WITH WEIL OSTEOTOMY Right 03/26/2022   Procedure: GASTROCNEMIUS RECESSION, CALCANEAL EXOSTECTOMY;  Surgeon: Terance Hart, MD;  Location: [facility] OR;  Service: Orthopedics;  Laterality: Right;   BACK SURGERY  05/18/2019   L4 and L5   CARDIAC CATHETERIZATION     CHOLECYSTECTOMY     CYSTECTOMY  07/2017   FOOT SURGERY Right 05/08/2022   JOINT REPLACEMENT Left    left knee   LAPAROSCOPIC ENDOMETRIOSIS FULGURATION  2011   LAPAROSCOPIC GASTRIC SLEEVE RESECTION  2018   LAPAROSCOPIC OOPHERECTOMY Right    LUMBAR EPIDURAL INJECTION   04/13/2018   MINOR HARDWARE REMOVAL Right 07/07/2022   Procedure: RIGHT ANKLE DEEP HARDWARE REMOVAL;  Surgeon: Terance Hart, MD;  Location: [facility] OR;  Service: Orthopedics;  Laterality: Right;   REVISION TOTAL KNEE ARTHROPLASTY Left 12/2017   TENOLYSIS Right 03/26/2022   Procedure: RIGHT ACHILLES TENDON RECONSTRUCTION AND DEBRIDEMENT;  Surgeon: Terance Hart, MD;  Location: [facility] OR;  Service: Orthopedics;  Laterality: Right;  LENGTH OF SURGERY: 120 MINUTES   TOTAL SHOULDER ARTHROPLASTY Bilateral    TRIGGER FINGER RELEASE Bilateral 2023   bilateral 3rd digit   TUBAL LIGATION     postpartum   ULNAR NERVE REPAIR Bilateral 2023   Social History   Social History Narrative   Right handed    Lives with husband and 1 daughters    Immunization History  Administered Date(s) Administered   Influenza,inj,Quad PF,6+ Mos 04/15/2018   PFIZER(Purple Top)SARS-COV-2 Vaccination 07/15/2019, 08/05/2019, 03/13/2020   Pfizer(Comirnaty)Fall Seasonal Vaccine 12 years and older 12/31/2022   Pneumococcal Polysaccharide-23 04/15/2018   Zoster Recombinant(Shingrix) 05/13/2018, 10/25/2019     Objective: Vital Signs: There were no vitals taken for this visit.   Physical Exam Vitals and nursing note reviewed.  Constitutional:      Appearance: She is well-developed.  HENT:     Head: Normocephalic and atraumatic.  Eyes:     Conjunctiva/sclera: Conjunctivae normal.  Cardiovascular:     Rate and Rhythm: Normal rate and regular rhythm.     Heart sounds: Normal heart sounds.  Pulmonary:     Effort: Pulmonary effort is normal.     Breath sounds: Normal breath sounds.  Abdominal:     General: Bowel sounds are normal.     Palpations: Abdomen is soft.  Musculoskeletal:     Cervical back: Normal range of motion.  Lymphadenopathy:     Cervical: No cervical adenopathy.  Skin:    General: Skin is warm and dry.     Capillary Refill: Capillary refill takes less than 2 seconds.  Neurological:      Mental Status: She is alert and oriented to person, place, and time.  Psychiatric:        Behavior: Behavior normal.      Musculoskeletal Exam: ***  CDAI Exam: CDAI Score: -- Patient Global: --; Provider Global: -- Swollen: --; Tender: -- Joint Exam 03/23/2023   No joint exam has been documented for this visit   There is currently no information documented on the homunculus. Go to the Rheumatology activity and complete the homunculus joint exam.  Investigation: No additional findings.  Imaging: No results found.  Recent Labs: Lab Results  Component Value Date   WBC 9.8 07/09/2022   HGB 8.1 (L) 07/09/2022   PLT 281 07/09/2022   NA 136 07/08/2022   K 4.4 07/08/2022   CL 104 07/08/2022   CO2 23 07/08/2022   GLUCOSE 330 (H) 07/08/2022  BUN 9 07/08/2022   CREATININE 0.81 07/08/2022   BILITOT 0.4 07/08/2022   ALKPHOS 102 07/08/2022   AST 50 (H) 07/08/2022   ALT 51 (H) 07/08/2022   PROT 6.5 07/08/2022   ALBUMIN 3.0 (L) 07/08/2022   CALCIUM 8.9 07/08/2022   GFRAA 103 10/08/2020   QFTBGOLDPLUS NEGATIVE 05/01/2020    Speciality Comments: Cosentyx start date:04/2018  Procedures:  No procedures performed Allergies: Aspirin and Tylenol [acetaminophen]   Assessment / Plan:     Visit Diagnoses: Psoriatic arthritis (HCC)  Psoriasis  High risk medication use  Thrombocytosis  Primary osteoarthritis of both hands  Ulnar neuropathy of both upper extremities  S/P total knee replacement, left  Primary osteoarthritis of right knee  Primary osteoarthritis of both feet  Degeneration of intervertebral disc of lumbar region without discogenic back pain or lower extremity pain  Chronic anticoagulation  Diverticulitis of large intestine with abscess without bleeding  History of gastric bypass  History of pulmonary embolism  Orders: No orders of the defined types were placed in this encounter.  No orders of the defined types were placed in this  encounter.   Face-to-face time spent with patient was *** minutes. Greater than 50% of time was spent in counseling and coordination of care.  Follow-Up Instructions: No follow-ups on file.   Gearldine Bienenstock, PA-C  Note - This record has been created using Dragon software.  Chart creation errors have been sought, but may not always  have been located. Such creation errors do not reflect on  the standard of medical care.

## 2023-03-23 ENCOUNTER — Ambulatory Visit: Payer: 59 | Admitting: Physician Assistant

## 2023-03-23 DIAGNOSIS — M19041 Primary osteoarthritis, right hand: Secondary | ICD-10-CM

## 2023-03-23 DIAGNOSIS — M1711 Unilateral primary osteoarthritis, right knee: Secondary | ICD-10-CM

## 2023-03-23 DIAGNOSIS — L409 Psoriasis, unspecified: Secondary | ICD-10-CM

## 2023-03-23 DIAGNOSIS — Z7901 Long term (current) use of anticoagulants: Secondary | ICD-10-CM

## 2023-03-23 DIAGNOSIS — M51369 Other intervertebral disc degeneration, lumbar region without mention of lumbar back pain or lower extremity pain: Secondary | ICD-10-CM

## 2023-03-23 DIAGNOSIS — M19071 Primary osteoarthritis, right ankle and foot: Secondary | ICD-10-CM

## 2023-03-23 DIAGNOSIS — Z79899 Other long term (current) drug therapy: Secondary | ICD-10-CM

## 2023-03-23 DIAGNOSIS — Z86711 Personal history of pulmonary embolism: Secondary | ICD-10-CM

## 2023-03-23 DIAGNOSIS — Z96652 Presence of left artificial knee joint: Secondary | ICD-10-CM

## 2023-03-23 DIAGNOSIS — L4050 Arthropathic psoriasis, unspecified: Secondary | ICD-10-CM

## 2023-03-23 DIAGNOSIS — D75839 Thrombocytosis, unspecified: Secondary | ICD-10-CM

## 2023-03-23 DIAGNOSIS — K5720 Diverticulitis of large intestine with perforation and abscess without bleeding: Secondary | ICD-10-CM

## 2023-03-23 DIAGNOSIS — Z9884 Bariatric surgery status: Secondary | ICD-10-CM

## 2023-03-23 DIAGNOSIS — G5623 Lesion of ulnar nerve, bilateral upper limbs: Secondary | ICD-10-CM

## 2023-03-29 NOTE — Progress Notes (Unsigned)
Office Visit Note  Patient: Connie Hoffman             Date of Birth: 10-16-1960           MRN: [id number]             PCP: Kerin Salen, PA-C Referring: Kathaleen Bury* Visit Date: 03/30/2023 Occupation: @GUAROCC @  Subjective:  Chronic pain   History of Present Illness: Connie Hoffman is a 62 y.o. female with history of psoriatic arthritis.   Patient is not currently taking any immunosuppressive agents given history of recurrent infections.  She was referred to pain management after her last office visit and has established care with Dr. Carlis Abbott.  She remains on gabapentin 300 mg 2 capsules twice daily.  Patient does not think that she ever resumed Cymbalta after her last office visit.  She was started on Topamax 25 mg 1 tablet at bedtime after her last office visit on 02/15/2023 with Dr. Carlis Abbott.  She has been tolerating Topamax but has not noticed any improvement in her symptoms yet.  She continues to have chronic pain involving multiple joints.  She is been taking Tylenol as needed for pain relief. She denies any joint swelling.   She uses topical agents for management of psoriasis.  She has not active patches right now but states if she does not use the cream the patches return.    Activities of Daily Living:  Patient reports morning stiffness for all day. Patient Reports nocturnal pain.  Difficulty dressing/grooming: Denies Difficulty climbing stairs: Reports Difficulty getting out of chair: Reports Difficulty using hands for taps, buttons, cutlery, and/or writing: Reports  Review of Systems  Constitutional:  Positive for fatigue.  HENT:  Positive for mouth dryness. Negative for mouth sores.   Eyes:  Positive for dryness. Negative for pain and visual disturbance.  Respiratory:  Positive for shortness of breath. Negative for cough and wheezing.   Cardiovascular:  Positive for chest pain. Negative for palpitations.  Gastrointestinal:  Negative for  blood in stool, constipation and diarrhea.  Endocrine: Negative for increased urination.  Genitourinary:  Negative for involuntary urination.  Musculoskeletal:  Positive for joint pain, gait problem, joint pain, joint swelling, myalgias, muscle weakness, morning stiffness, muscle tenderness and myalgias.  Skin:  Positive for sensitivity to sunlight. Negative for color change and rash.  Allergic/Immunologic: Negative for susceptible to infections.  Neurological:  Positive for numbness and headaches. Negative for dizziness.  Hematological:  Negative for swollen glands.  Psychiatric/Behavioral:  Positive for sleep disturbance. Negative for depressed mood. The patient is not nervous/anxious.     PMFS History:  Patient Active Problem List   Diagnosis Date Noted   ABLA (acute blood loss anemia) 07/08/2022   T2DM (type 2 diabetes mellitus) (HCC) 07/08/2022   Hyperglycemia 07/08/2022   History of anxiety 07/08/2022   Hypertension 07/08/2022   Obesity, Class III, BMI 40-49.9 (morbid obesity) (HCC) 07/08/2022   Anxiety 07/08/2022   Pre-diabetes 07/08/2022   Right ankle pain 07/07/2022   Open wound of right foot 06/03/2022   Achilles tendonosis of right lower extremity 03/26/2022   Seizure (HCC) 11/03/2021   Diverticulitis of large intestine with abscess without bleeding 05/28/2021   History of pulmonary embolism 05/28/2021   Chronic anticoagulation 05/28/2021   Midline cystocele 05/23/2020   Pelvic mass 12/04/2019   DDD (degenerative disc disease), lumbar 01/21/2018   Primary osteoarthritis of both hands 01/21/2018   Primary osteoarthritis of both feet 01/21/2018   Psoriasis 12/01/2017   Psoriatic  arthritis (HCC) 12/01/2017   High risk medication use 12/01/2017   S/P total knee replacement, 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   Thrombocytosis 12/01/2017   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    Past Medical History:  Diagnosis Date   Anemia    Anxiety    Chest pain syndrome    Chronic stable angina  (HCC)    DDD (degenerative disc disease), lumbar    Gastritis    Headache    Hematuria    resolved after removing ASA, only on Eliquis now   History of hiatal hernia    Hypertension    Intestinal abscess    MRSA infection    After knee surgery   Observed seizure-like activity (HCC)    Pre-diabetes    Precordial pain    Psoriatic arthritis (HCC)    Pulmonary embolism (HCC)    Bilateral lungs   Refusal of blood transfusions as patient is Jehovah's Witness    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CC)    Sacroiliac pain    Seizure (HCC)    Spinal stenosis    Thyroid nodule     Family History  Problem Relation Age of Onset   Diabetes Mother    Hypertension Mother    Heart Problems Mother    Congestive Heart Failure Mother    Diabetes Father    Hypertension Father    Psoriasis Father    Stroke Sister    Heart Problems Sister    Seizures Sister    Prostate cancer Brother    Diabetes Brother    Asthma Daughter    Healthy Daughter    Healthy Son    Healthy Son    Past Surgical History:  Procedure Laterality Date   ABDOMINAL HYSTERECTOMY     ARTHRODESIS FOOT WITH WEIL OSTEOTOMY Right 03/26/2022   Procedure: GASTROCNEMIUS RECESSION, CALCANEAL EXOSTECTOMY;  Surgeon: Terance Hart, MD;  Location: [facility] OR;  Service: Orthopedics;  Laterality: Right;   BACK SURGERY  05/18/2019   L4 and L5   CARDIAC CATHETERIZATION     CHOLECYSTECTOMY     CYSTECTOMY  07/2017   FOOT SURGERY Right 05/08/2022   JOINT REPLACEMENT Left    left knee   LAPAROSCOPIC ENDOMETRIOSIS FULGURATION  2011   LAPAROSCOPIC GASTRIC SLEEVE RESECTION  2018   LAPAROSCOPIC OOPHERECTOMY Right    LUMBAR EPIDURAL INJECTION  04/13/2018   MINOR HARDWARE REMOVAL Right 07/07/2022   Procedure: RIGHT ANKLE DEEP HARDWARE REMOVAL;  Surgeon: Terance Hart, MD;  Location: [facility] OR;  Service: Orthopedics;  Laterality: Right;   REVISION TOTAL KNEE ARTHROPLASTY Left 12/2017   TENOLYSIS Right 03/26/2022   Procedure: RIGHT ACHILLES  TENDON RECONSTRUCTION AND DEBRIDEMENT;  Surgeon: Terance Hart, MD;  Location: [facility] OR;  Service: Orthopedics;  Laterality: Right;  LENGTH OF SURGERY: 120 MINUTES   TOTAL SHOULDER ARTHROPLASTY Bilateral    TRIGGER FINGER RELEASE Bilateral 2023   bilateral 3rd digit   TUBAL LIGATION     postpartum   ULNAR NERVE REPAIR Bilateral 2023   Social History   Social History Narrative   Right handed    Lives with husband and 1 daughters    Immunization History  Administered Date(s) Administered   Influenza,inj,Quad PF,6+ Mos 04/15/2018   PFIZER(Purple Top)SARS-COV-2 Vaccination 07/15/2019, 08/05/2019, 03/13/2020   Pfizer(Comirnaty)Fall Seasonal Vaccine 12 years and older 12/31/2022   Pneumococcal Polysaccharide-23 04/15/2018   Zoster Recombinant(Shingrix) 05/13/2018, 10/25/2019     Objective: Vital Signs: BP 130/84 (BP Location: Left Arm, Patient Position: Sitting, Cuff Size: Large)   Pulse  67   Resp 15   Ht 5\' 5"  (1.651 m)   Wt 289 lb 12.8 oz (131.5 kg)   BMI 48.23 kg/m    Physical Exam Vitals and nursing note reviewed.  Constitutional:      Appearance: She is well-developed.  HENT:     Head: Normocephalic and atraumatic.  Eyes:     Conjunctiva/sclera: Conjunctivae normal.  Cardiovascular:     Rate and Rhythm: Normal rate and regular rhythm.     Heart sounds: Normal heart sounds.  Pulmonary:     Effort: Pulmonary effort is normal.     Breath sounds: Normal breath sounds.  Abdominal:     General: Bowel sounds are normal.     Palpations: Abdomen is soft.  Musculoskeletal:     Cervical back: Normal range of motion.  Lymphadenopathy:     Cervical: No cervical adenopathy.  Skin:    General: Skin is warm and dry.     Capillary Refill: Capillary refill takes less than 2 seconds.  Neurological:     Mental Status: She is alert and oriented to person, place, and time.  Psychiatric:        Behavior: Behavior normal.      Musculoskeletal Exam: Generalized hyperalgesia  and positive tender points on exam.  C-spine has limited range of motion with lateral rotation.  Shoulder joints have limited abduction to about 110 degrees. elbow joints, wrist joints, MCPs, PIPs, and DIPs good ROM with no synovitis.  Complete fist formation bilaterally.  Hip joints have good ROM with no groin pain.  Knee joints have good ROM with no warmth or effusion.  Ankle joints have good ROM with no tenderness or joint swelling.    CDAI Exam: CDAI Score: -- Patient Global: --; Provider Global: -- Swollen: --; Tender: -- Joint Exam 03/30/2023   No joint exam has been documented for this visit   There is currently no information documented on the homunculus. Go to the Rheumatology activity and complete the homunculus joint exam.  Investigation: No additional findings.  Imaging: No results found.  Recent Labs: Lab Results  Component Value Date   WBC 9.8 07/09/2022   HGB 8.1 (L) 07/09/2022   PLT 281 07/09/2022   NA 136 07/08/2022   K 4.4 07/08/2022   CL 104 07/08/2022   CO2 23 07/08/2022   GLUCOSE 330 (H) 07/08/2022   BUN 9 07/08/2022   CREATININE 0.81 07/08/2022   BILITOT 0.4 07/08/2022   ALKPHOS 102 07/08/2022   AST 50 (H) 07/08/2022   ALT 51 (H) 07/08/2022   PROT 6.5 07/08/2022   ALBUMIN 3.0 (L) 07/08/2022   CALCIUM 8.9 07/08/2022   GFRAA 103 10/08/2020   QFTBGOLDPLUS NEGATIVE 05/01/2020    Speciality Comments: Cosentyx start date:04/2018  Procedures:  No procedures performed Allergies: Aspirin and Tylenol [acetaminophen]   Assessment / Plan:     Visit Diagnoses: Psoriatic arthritis (HCC): She has no synovitis or dactylitis on examination today.  Patient continues to have chronic pain involving multiple joints but has no active inflammation at this time.  She is not currently taking any immunosuppressive agents given history of recurrent infections.  Patient had to discontinue Otezla due to significant diarrhea as well as due to no clinical benefit.  She is  unable to take NSAIDs since she is on Eliquis.  She has been taking Tylenol as needed for pain relief.  She was referred to pain management after her last office visit--evaluated by Raulkar.  She remains on gabapentin but  did not start cymbalta.  She has been started on topamax 25 mg at bedtime, which she has been tolerating without side effects but has not noticed any clinical benefit.  Patient plans on following up with Dr. Carlis Abbott to discuss dosing adjustments.   She does not require immunosuppressive therapy at this time.  She will follow up in 6 months or sooner if needed.   Psoriasis - Inverse psoriasis.  Under the care of dermatology.  Using triamcinolone cream topically daily--discouraged continual use of steroid creams due to thinning of the skin and risk of pigmentation changes.  May be a good candidate for Vtama if these patches continue to recur.   High risk medication use - Discussed that she is not a good candidate for systemic immunosuppression due to history of recurrent infections including a sigmoid colon abscess, nonhealing surgical incision, and MRSA.  Previous therapy: Cosentyx: History of diverticulitis with sigmoid colon abscess. Otezla-discontinued due to diarrhea.   Thrombocytosis: Platelet count WNL on 01/18/23.   Primary osteoarthritis of both hands: Chronic pain. Tenderness of the right 2nd MCP joint-no synovitis or dactylitis noted.   Ulnar neuropathy of both upper extremities - S/p surgical release bilaterally-performed by Dr. Madelon Lips. Under care of Dr. Allena Katz.  She is taking gabapentin 300 mg 2 capsules BID.    S/P total knee replacement, left: Chronic pain. Warmth but no effusion noted.    Primary osteoarthritis of right knee: Chronic pain. No warmth or effusion.    Primary osteoarthritis of both feet - Patient has been under the care of Dr. Susa Simmonds.  She underwent right Achilles tendon reconstruction on 03/26/2022. Chronic pain. Using TENS unit.   Spondylosis of  lumbar spine - Chronic pain.   Other medical conditions are listed as follows:   Chronic anticoagulation  Diverticulitis of large intestine with abscess without bleeding  History of gastric bypass  Nocturnal muscle cramps  History of pulmonary embolism  Orders: No orders of the defined types were placed in this encounter.  No orders of the defined types were placed in this encounter.     Follow-Up Instructions: Return in about 6 months (around 09/28/2023) for Psoriatic arthritis.   Gearldine Bienenstock, PA-C  Note - This record has been created using Dragon software.  Chart creation errors have been sought, but may not always  have been located. Such creation errors do not reflect on  the standard of medical care.

## 2023-03-30 ENCOUNTER — Ambulatory Visit: Payer: 59 | Attending: Physician Assistant | Admitting: Physician Assistant

## 2023-03-30 ENCOUNTER — Encounter: Payer: Self-pay | Admitting: Physician Assistant

## 2023-03-30 VITALS — BP 130/84 | HR 67 | Resp 15 | Ht 65.0 in | Wt 289.8 lb

## 2023-03-30 DIAGNOSIS — Z79899 Other long term (current) drug therapy: Secondary | ICD-10-CM

## 2023-03-30 DIAGNOSIS — R252 Cramp and spasm: Secondary | ICD-10-CM

## 2023-03-30 DIAGNOSIS — M1711 Unilateral primary osteoarthritis, right knee: Secondary | ICD-10-CM

## 2023-03-30 DIAGNOSIS — Z7901 Long term (current) use of anticoagulants: Secondary | ICD-10-CM

## 2023-03-30 DIAGNOSIS — M47816 Spondylosis without myelopathy or radiculopathy, lumbar region: Secondary | ICD-10-CM

## 2023-03-30 DIAGNOSIS — D75839 Thrombocytosis, unspecified: Secondary | ICD-10-CM | POA: Diagnosis not present

## 2023-03-30 DIAGNOSIS — Z96652 Presence of left artificial knee joint: Secondary | ICD-10-CM

## 2023-03-30 DIAGNOSIS — Z86711 Personal history of pulmonary embolism: Secondary | ICD-10-CM

## 2023-03-30 DIAGNOSIS — L4050 Arthropathic psoriasis, unspecified: Secondary | ICD-10-CM

## 2023-03-30 DIAGNOSIS — L409 Psoriasis, unspecified: Secondary | ICD-10-CM

## 2023-03-30 DIAGNOSIS — M19041 Primary osteoarthritis, right hand: Secondary | ICD-10-CM

## 2023-03-30 DIAGNOSIS — G5623 Lesion of ulnar nerve, bilateral upper limbs: Secondary | ICD-10-CM

## 2023-03-30 DIAGNOSIS — M19071 Primary osteoarthritis, right ankle and foot: Secondary | ICD-10-CM

## 2023-03-30 DIAGNOSIS — K5720 Diverticulitis of large intestine with perforation and abscess without bleeding: Secondary | ICD-10-CM

## 2023-03-30 DIAGNOSIS — M19042 Primary osteoarthritis, left hand: Secondary | ICD-10-CM

## 2023-03-30 DIAGNOSIS — Z9884 Bariatric surgery status: Secondary | ICD-10-CM

## 2023-03-30 DIAGNOSIS — M19072 Primary osteoarthritis, left ankle and foot: Secondary | ICD-10-CM

## 2023-04-12 ENCOUNTER — Encounter: Payer: 59 | Attending: Physical Medicine and Rehabilitation | Admitting: Physical Medicine and Rehabilitation

## 2023-04-12 DIAGNOSIS — E1142 Type 2 diabetes mellitus with diabetic polyneuropathy: Secondary | ICD-10-CM | POA: Insufficient documentation

## 2023-04-27 ENCOUNTER — Ambulatory Visit: Payer: 59 | Admitting: Neurology

## 2023-06-08 ENCOUNTER — Other Ambulatory Visit: Payer: Self-pay | Admitting: Orthopaedic Surgery

## 2023-06-08 DIAGNOSIS — G8929 Other chronic pain: Secondary | ICD-10-CM

## 2023-06-15 ENCOUNTER — Ambulatory Visit
Admission: RE | Admit: 2023-06-15 | Discharge: 2023-06-15 | Disposition: A | Discharge status: Home / Self Care | Payer: 59 | Source: Ambulatory Visit | Attending: Orthopaedic Surgery

## 2023-06-15 DIAGNOSIS — G8929 Other chronic pain: Secondary | ICD-10-CM

## 2023-06-18 ENCOUNTER — Encounter: Payer: Self-pay | Admitting: Physical Medicine and Rehabilitation

## 2023-06-20 NOTE — Telephone Encounter (Signed)
 I reviewed her last office visit with Ladona Ridgel.  Patient is under care of a dermatologist.  As she is not having a psoriatic arthritis outbreak and it is psoriasis outbreak she should contact her dermatologist.  She should also continue to use topical agents as prescribed by her dermatologist.

## 2023-06-21 ENCOUNTER — Encounter: Payer: Medicaid Other | Admitting: Physical Medicine and Rehabilitation

## 2023-06-21 NOTE — Telephone Encounter (Signed)
 yes

## 2023-06-22 ENCOUNTER — Telehealth: Payer: Self-pay | Admitting: *Deleted

## 2023-06-22 ENCOUNTER — Encounter: Payer: Self-pay | Admitting: *Deleted

## 2023-06-22 NOTE — Telephone Encounter (Signed)
 Patient called office to discuss why she had been dismissed from practice. Patient stated she wanted an answer regarding her psoriasis. I explained to the patient that her dermatologist should address these questions based on Dr. Fatima Sanger response. Patient continued to use derogative statements towards Dr. Corliss Skains. Patient verbalized understanding.

## 2023-06-22 NOTE — Telephone Encounter (Signed)
 Dismissal letter mailed to patient.

## 2023-06-28 ENCOUNTER — Telehealth: Payer: Self-pay | Admitting: Neurology

## 2023-06-28 ENCOUNTER — Telehealth: Payer: Self-pay | Admitting: Physical Medicine and Rehabilitation

## 2023-06-28 NOTE — Telephone Encounter (Signed)
 Caller stated she is having surgery soon and just wanted Dr Allena Katz to be on the lookout for forms to OK her surgery.

## 2023-06-28 NOTE — Telephone Encounter (Signed)
 Noted.

## 2023-06-28 NOTE — Telephone Encounter (Signed)
 Patient called in states she having shoulder surgery and Dr Everardo Pacific her orthopedics need clearance from all her providers and they have not received anything back from our office , patient is asking we received anything from orthopedic office to please sent it back

## 2023-06-29 ENCOUNTER — Encounter: Payer: Self-pay | Admitting: *Deleted

## 2023-06-29 NOTE — Telephone Encounter (Signed)
 Message sent to Ms Ertl letting her know we do not do clearance for surgery.

## 2023-08-03 NOTE — Progress Notes (Signed)
 COVID Vaccine received:  []  No [x]  Yes Date of any COVID positive Test in last 90 days: no PCP - Michelle Nasuti PA-C Cardiologist - Muna Timsina PA  Chest x-ray -  EKG -   Stress Test -  ECHO -  Cardiac Cath -   Bowel Prep - [x]  No  []   Yes ______  Pacemaker / ICD device [x]  No []  Yes   Spinal Cord Stimulator:[x]  No []  Yes       History of Sleep Apnea? [x]  No []  Yes   CPAP used?- [x]  No []  Yes    Does the patient monitor blood sugar?          []  No [x]  Yes  []  N/A  Patient has: []  NO Hx DM   []  Pre-DM                 []  DM1  [x]   DM2 Does patient have a [employer] or Dexacom? []  No [x]  Yes   Fasting Blood Sugar Ranges- 89 Checks Blood Sugar ____4_ times a day  GLP1 agonist / usual dose - no GLP1 instructions:  SGLT-2 inhibitors / usual dose - no SGLT-2 instructions:   Blood Thinner / Instructions:Eliquis hold x 2 days prior to surgery.Last dose 4/20 AM= 72hours Aspirin Instructions:no  Comments:   Activity level: Patient is able  to climb a flight of stairs without difficulty; [x]  No CP  [x]  No SOB,Patient can / can not perform ADLs without assistance.   Anesthesia review:  Hx. Angina, PE, HTN,DM Patient denies shortness of breath, fever, cough and chest pain at PAT appointment.  Patient verbalized understanding and agreement to the Pre-Surgical Instructions that were given to them at this PAT appointment. Patient was also educated of the need to review these PAT instructions again prior to his/her surgery.I reviewed the appropriate phone numbers to call if they have any and questions or concerns.

## 2023-08-03 NOTE — Patient Instructions (Signed)
 SURGICAL WAITING ROOM VISITATION  Patients having surgery or a procedure may have no more than 2 support people in the waiting area - these visitors may rotate.    Children under the age of 45 must have an adult with them who is not the patient.  Due to an increase in RSV and influenza rates and associated hospitalizations, children ages 41 and under may not visit patients in [facility] hospitals.  Visitors with respiratory illnesses are discouraged from visiting and should remain at home.  If the patient needs to stay at the hospital during part of their recovery, the visitor guidelines for inpatient rooms apply. Pre-op nurse will coordinate an appropriate time for 1 support person to accompany patient in pre-op.  This support person may not rotate.    Please refer to the [facility] website for the visitor guidelines for Inpatients (after your surgery is over and you are in a regular room).       Your procedure is scheduled on: 08/18/23   Report to [facility] Main Entrance    Report to admitting at 5:15 AM   Call this number if you have problems the morning of surgery [phone]   Do not eat food or drink liquids:After Midnight. But may have sips of water with meds.        Oral Hygiene is also important to reduce your risk of infection.                                    Remember - BRUSH YOUR TEETH THE MORNING OF SURGERY WITH YOUR REGULAR TOOTHPASTE  DENTURES WILL BE REMOVED PRIOR TO SURGERY PLEASE DO NOT APPLY "Poly grip" OR ADHESIVES!!!   Do NOT smoke after Midnight   Stop all vitamins and herbal supplements 7 days before surgery.   Take these medicines the morning of surgery with A SIP OF WATER: Atorvastatin, dexilant, gabapentin, imdur, metoprolol, sertraline(Zoloft)  DO NOT TAKE ANY ORAL DIABETIC MEDICATIONS DAY OF YOUR SURGERY             You may not have any metal on your body including hair pins, jewelry, and body piercing             Do not wear  make-up, lotions, powders, perfumes/cologne, or deodorant  Do not wear nail polish including gel and S&S, artificial/acrylic nails, or any other type of covering on natural nails including finger and toenails. If you have artificial nails, gel coating, etc. that needs to be removed by a nail salon please have this removed prior to surgery or surgery may need to be canceled/ delayed if the surgeon/ anesthesia feels like they are unable to be safely monitored.   Do not shave  48 hours prior to surgery.    Do not bring valuables to the hospital. [facility] IS NOT             RESPONSIBLE   FOR VALUABLES.   Contacts, glasses, dentures or bridgework may not be worn into surgery.  DO NOT BRING YOUR HOME MEDICATIONS TO THE HOSPITAL. PHARMACY WILL DISPENSE MEDICATIONS LISTED ON YOUR MEDICATION LIST TO YOU DURING YOUR ADMISSION IN THE HOSPITAL!    Patients discharged on the day of surgery will not be allowed to drive home.  Someone NEEDS to stay with you for the first 24 hours after anesthesia.   Special Instructions: Bring a copy of your healthcare power of  attorney and living will documents the day of surgery if you haven't scanned them before.              Please read over the following fact sheets you were given: IF YOU HAVE QUESTIONS ABOUT YOUR PRE-OP INSTRUCTIONS PLEASE CALL [phone] [facility]   If you received a COVID test during your pre-op visit  it is requested that you wear a mask when out in public, stay away from anyone that may not be feeling well and notify your surgeon if you develop symptoms. If you test positive for Covid or have been in contact with anyone that has tested positive in the last 10 days please notify you surgeon.      Pre-operative 5 CHG Bath Instructions   You can play a key role in reducing the risk of infection after surgery. Your skin needs to be as free of germs as possible. You can reduce the number of germs on your skin by washing with CHG (chlorhexidine  gluconate) soap before surgery. CHG is an antiseptic soap that kills germs and continues to kill germs even after washing.   DO NOT use if you have an allergy to chlorhexidine/CHG or antibacterial soaps. If your skin becomes reddened or irritated, stop using the CHG and notify one of our RNs at [phone].   Please shower with the CHG soap starting 4 days before surgery using the following schedule:     Please keep in mind the following:  DO NOT shave, including legs and underarms, starting the day of your first shower.   You may shave your face at any point before/day of surgery.  Place clean sheets on your bed the day you start using CHG soap. Use a clean washcloth (not used since being washed) for each shower. DO NOT sleep with pets once you start using the CHG.   CHG Shower Instructions:  If you choose to wash your hair and private area, wash first with your normal shampoo/soap.  After you use shampoo/soap, rinse your hair and body thoroughly to remove shampoo/soap residue.  Turn the water OFF and apply about 3 tablespoons (45 ml) of CHG soap to a CLEAN washcloth.  Apply CHG soap ONLY FROM YOUR NECK DOWN TO YOUR TOES (washing for 3-5 minutes)  DO NOT use CHG soap on face, private areas, open wounds, or sores.  Pay special attention to the area where your surgery is being performed.  If you are having back surgery, having someone wash your back for you may be helpful. Wait 2 minutes after CHG soap is applied, then you may rinse off the CHG soap.  Pat dry with a clean towel  Put on clean clothes/pajamas   If you choose to wear lotion, please use ONLY the CHG-compatible lotions on the back of this paper.     Additional instructions for the day of surgery: DO NOT APPLY any lotions, deodorants, cologne, or perfumes.   Put on clean/comfortable clothes.  Brush your teeth.  Ask your nurse before applying any prescription medications to the skin.      CHG Compatible Lotions    Aveeno Moisturizing lotion  Cetaphil Moisturizing Cream  Cetaphil Moisturizing Lotion  Clairol Herbal Essence Moisturizing Lotion, Dry Skin  Clairol Herbal Essence Moisturizing Lotion, Extra Dry Skin  Clairol Herbal Essence Moisturizing Lotion, Normal Skin  Curel Age Defying Therapeutic Moisturizing Lotion with Alpha Hydroxy  Curel Extreme Care Body Lotion  Curel Soothing Hands Moisturizing Hand Lotion  Curel Therapeutic Moisturizing Cream, Fragrance-Free  Curel Therapeutic Moisturizing Lotion, Fragrance-Free  Curel Therapeutic Moisturizing Lotion, Original Formula  Eucerin Daily Replenishing Lotion  Eucerin Dry Skin Therapy Plus Alpha Hydroxy Crme  Eucerin Dry Skin Therapy Plus Alpha Hydroxy Lotion  Eucerin Original Crme  Eucerin Original Lotion  Eucerin Plus Crme Eucerin Plus Lotion  Eucerin TriLipid Replenishing Lotion  Keri Anti-Bacterial Hand Lotion  Keri Deep Conditioning Original Lotion Dry Skin Formula Softly Scented  Keri Deep Conditioning Original Lotion, Fragrance Free Sensitive Skin Formula  Keri Lotion Fast Absorbing Fragrance Free Sensitive Skin Formula  Keri Lotion Fast Absorbing Softly Scented Dry Skin Formula  Keri Original Lotion  Keri Skin Renewal Lotion Keri Silky Smooth Lotion  Keri Silky Smooth Sensitive Skin Lotion  Nivea Body Creamy Conditioning Oil  Nivea Body Extra Enriched Lotion  Nivea Body Original Lotion  Nivea Body Sheer Moisturizing Lotion Nivea Crme  Nivea Skin Firming Lotion  NutraDerm 30 Skin Lotion  NutraDerm Skin Lotion  NutraDerm Therapeutic Skin Cream  NutraDerm Therapeutic Skin Lotion  ProShield Protective Hand Cream   Incentive Spirometer An incentive spirometer is a tool that can help keep your lungs clear and active. This tool measures how well you are filling your lungs with each breath. Taking long deep breaths may help reverse or decrease the chance of developing breathing (pulmonary) problems (especially infection)  following: A long period of time when you are unable to move or be active. BEFORE THE PROCEDURE  If the spirometer includes an indicator to show your best effort, your nurse or respiratory therapist will set it to a desired goal. If possible, sit up straight or lean slightly forward. Try not to slouch. Hold the incentive spirometer in an upright position. INSTRUCTIONS FOR USE  Sit on the edge of your bed if possible, or sit up as far as you can in bed or on a chair. Hold the incentive spirometer in an upright position. Breathe out normally. Place the mouthpiece in your mouth and seal your lips tightly around it. Breathe in slowly and as deeply as possible, raising the piston or the ball toward the top of the column. Hold your breath for 3-5 seconds or for as long as possible. Allow the piston or ball to fall to the bottom of the column. Remove the mouthpiece from your mouth and breathe out normally. Rest for a few seconds and repeat Steps 1 through 7 at least 10 times every 1-2 hours when you are awake. Take your time and take a few normal breaths between deep breaths. The spirometer may include an indicator to show your best effort. Use the indicator as a goal to work toward during each repetition. After each set of 10 deep breaths, practice coughing to be sure your lungs are clear. If you have an incision (the cut made at the time of surgery), support your incision when coughing by placing a pillow or rolled up towels firmly against it. Once you are able to get out of bed, walk around indoors and cough well. You may stop using the incentive spirometer when instructed by your caregiver.  RISKS AND COMPLICATIONS Take your time so you do not get dizzy or light-headed. If you are in pain, you may need to take or ask for pain medication before doing incentive spirometry. It is harder to take a deep breath if you are having pain. AFTER USE Rest and breathe slowly and easily. It can be helpful to  keep track of a log of your progress. Your caregiver can provide you with  a simple table to help with this. If you are using the spirometer at home, follow these instructions: SEEK MEDICAL CARE IF:  You are having difficultly using the spirometer. You have trouble using the spirometer as often as instructed. Your pain medication is not giving enough relief while using the spirometer. You develop fever of 100.5 F (38.1 C) or higher. SEEK IMMEDIATE MEDICAL CARE IF:  You cough up bloody sputum that had not been present before. You develop fever of 102 F (38.9 C) or greater. You develop worsening pain at or near the incision site. MAKE SURE YOU:  Understand these instructions. Will watch your condition. Will get help right away if you are not doing well or get worse  Getting ready for shoulder surgery Before surgery, you can play an important role. Because skin is not sterile, your skin needs to be as free of germs as possible. You can reduce the number of germs on your skin by using the following products. Benzoyl Peroxide Gel Reduces the number of germs present on the skin Applied twice a day to shoulder area starting two days before surgery    ==================================================================  Please follow these instructions carefully:  BENZOYL PEROXIDE 5% GEL  Please do not use if you have an allergy to benzoyl peroxide.   If your skin becomes reddened/irritated stop using the benzoyl peroxide.  Starting two days before surgery, apply as follows: Apply benzoyl peroxide in the morning and at night. Apply after taking a shower. If you are not taking a shower clean entire shoulder front, back, and side along with the armpit with a clean wet washcloth.  Place a quarter-sized dollop on your shoulder and rub in thoroughly, making sure to cover the front, back, and side of your shoulder, along with the armpit.   2 days before ____ AM   ____ PM              1 day  before ____ AM   ____ PM                         Do this twice a day for two days.  (Last application is the night before surgery, AFTER using the CHG soap as described below).  Do NOT apply benzoyl peroxide gel on the day of surgery.

## 2023-08-05 ENCOUNTER — Other Ambulatory Visit: Payer: Self-pay

## 2023-08-05 ENCOUNTER — Encounter (HOSPITAL_COMMUNITY): Payer: Self-pay

## 2023-08-05 ENCOUNTER — Encounter (HOSPITAL_COMMUNITY)
Admission: RE | Admit: 2023-08-05 | Discharge: 2023-08-05 | Disposition: A | Discharge status: Home / Self Care | Source: Ambulatory Visit | Attending: Orthopaedic Surgery | Admitting: Orthopaedic Surgery

## 2023-08-05 VITALS — BP 165/73 | HR 70 | Temp 98.0°F | Resp 18 | Ht 65.0 in | Wt 299.0 lb

## 2023-08-05 DIAGNOSIS — Z9884 Bariatric surgery status: Secondary | ICD-10-CM | POA: Insufficient documentation

## 2023-08-05 DIAGNOSIS — Z79899 Other long term (current) drug therapy: Secondary | ICD-10-CM | POA: Diagnosis not present

## 2023-08-05 DIAGNOSIS — Z7901 Long term (current) use of anticoagulants: Secondary | ICD-10-CM | POA: Insufficient documentation

## 2023-08-05 DIAGNOSIS — E119 Type 2 diabetes mellitus without complications: Secondary | ICD-10-CM | POA: Diagnosis not present

## 2023-08-05 DIAGNOSIS — Z86711 Personal history of pulmonary embolism: Secondary | ICD-10-CM | POA: Diagnosis not present

## 2023-08-05 DIAGNOSIS — I10 Essential (primary) hypertension: Secondary | ICD-10-CM | POA: Diagnosis not present

## 2023-08-05 DIAGNOSIS — Z01818 Encounter for other preprocedural examination: Secondary | ICD-10-CM | POA: Insufficient documentation

## 2023-08-05 LAB — SURGICAL PCR SCREEN
MRSA, PCR: NEGATIVE
Staphylococcus aureus: NEGATIVE

## 2023-08-05 LAB — GLUCOSE, CAPILLARY: Glucose-Capillary: 181 mg/dL — ABNORMAL HIGH (ref 70–99)

## 2023-08-05 NOTE — Progress Notes (Signed)
 Pt. Here for preop instructions. She revealed that she reuses blood due to her religion. Blood refusal form signed.

## 2023-08-06 LAB — NO BLOOD PRODUCTS

## 2023-08-06 NOTE — Progress Notes (Addendum)
 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   Last seen in cardiology follow-up at [facility] on 07/01/2023 for history of chest pain syndrome.  Per note, "Chest Discomfort, likely secondary to microvascular angina versus esophageal spasm -LHC on 03/21/2021 showed normal coronary arteries -Unremarkable echo on 02/15/2021 -She did have GI referral in the past. -She is on Imdur 120 mg daily, metoprolol 50 mg daily and Lipitor 80 mg daily. The PET stress from today shows "Small sized, mild intensity, equivocal, mostly fixed perfusion defect involving the apical septal and apical anterior walls of the LV . Given normal associated regional wall motion, it is possibly artifactual. A coronary CT could further evaluate given minimal coronary artery calcification" The patient feels better overall since the last visit. Will wait for further testing."  Cardiac clearance 07/02/2023 states, "Shanea Karney is high cardiac risk and may proceed with procedure from cardiac perspective."  However, RCRI is 3.9%.  Recent admission at [facility] 3/28 through 07/27/2023 for intentional overdose (10 tablets of 500 mg Robaxin and 20 tablets of 500 mg Tylenol).  At time of discharge she was noted to be oriented x 3 and in NAD, mood and affect much improved.  She did have subsequent follow-up with PCP on 08/03/2023.  Per note, "1. 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  Intentional overdose, subsequent encounter: Improving but I do worry about the amount of stress going in her life currently especially with her upcoming shoulder surgery. She states she has a plan for an aid to come out 4 hours a day to help her and her sister plans to come visit for 1 month. We will see if our SW here can see her for therapy soon. Continue zoloft. She denies any present suicidal ideation. Patient was educated about steps to take if suicide or homicide risk level increases between visits. While future psychiatric events cannot be accurately predicted, the patient does not currently require acute inpatient psychiatric care and does not currently meet [facility] involuntary commitment criteria. She is aware of suicide hotline."  LD Eliquis 4/20 am.  CMP and CBC from 07/23/2023 in Care Everywhere reviewed.  CMP overall unremarkable, sodium 140, potassium 3.7, glucose 186, creatinine 0.75, alkaline phos 169, AST 52, ALT 58.  CBC showed mild anemia H/H 11.2/35.9, otherwise unremarkable, WBC 7.87, platelets 327.  History reviewed with anesthesiologist Dr. Renold Don, advised okay to proceed barring acute status change.  EKG 07/23/2023 (Care Everywhere): Sinus rhythm.  Rate 75.  Possible LAE.  Possible LVH.  Cardiac PET stress 07/01/2023 (Care Everywhere): 1. Small sized,  mild intensity, equivocal, mostly fixed perfusion defect involving the apical septal and apical anterior walls of the LV . Given normal associated regional wall motion, it is possibly artifactual. A coronary CT could further evaluate given minimal coronary artery calcification  2. Normal LV systolic function with appropriate augmentation with stress  3. Global myocardial flow reserve is preserved (>2)  4. Ancillary CT findings as above.   TTE 06/03/2023 (Care Everywhere): SUMMARY  The left ventricular size is normal.  Mild left ventricular hypertrophy.  Left ventricular  systolic function is normal.  LV ejection fraction  = 60-65%.  LV global longitudinal strain performed and reviewed but not reported due to suboptimal myocardial  tracking.  The right ventricle is normal in size and function.  The left atrium is mildly to moderately dilated.  There was insufficient TR detected to calculate RV systolic pressure.  The inferior vena cava was not visualized during the exam.  There is no significant valvular stenosis or regurgitation.  There is no pericardial effusion.  Compared to the prior study, there is probably no significant change.   Cardiac cath 03/21/21 (Care Everywhere): Impression:  Normal coronary arteries  Moderately elevated LVEDP   Plan:  1. Secondary prevention of coronary artery disease  2. Medical management of elevated LVEDP    Echo 02/15/21 (Care Everywhere): SUMMARY  Normal biventricular size and function, LVEF 60-65%  No significant valvular heart disease, no pericardial effusion. Trace aortic regurgitation. Trace tricuspid regurgitation. Trace pulmonic regurgitation.  No prior study for comparison.     Zannie Cove [facility] Short Stay Center/Anesthesiology Phone [phone] 08/06/2023 2:19 PM

## 2023-08-06 NOTE — Anesthesia Preprocedure Evaluation (Addendum)
 Anesthesia Evaluation  Patient identified by MRN, date of birth, ID band Patient awake    Reviewed: Allergy & Precautions, NPO status , Patient's Chart, lab work & pertinent test results, reviewed documented beta blocker date and time   History of Anesthesia Complications Negative for: history of anesthetic complications  Airway Mallampati: I  TM Distance: >3 FB Neck ROM: Full    Dental  (+) Dental Advisory Given, Teeth Intact   Pulmonary PE   Pulmonary exam normal        Cardiovascular hypertension, Pt. on home beta blockers and Pt. on medications + angina  Normal cardiovascular exam     Neuro/Psych  Headaches PSYCHIATRIC DISORDERS Anxiety Depression     Recent suicide attempt via OD  Seizures vs. Sleep myoclonus     GI/Hepatic Neg liver ROS, hiatal hernia,GERD  Medicated and Controlled,, Hx gastric sleeve    Endo/Other  diabetes, Type 2  Class 4 obesity  Renal/GU negative Renal ROS     Musculoskeletal  (+) Arthritis ,    Abdominal  (+) + obese  Peds  Hematology  (+) REFUSES BLOOD PRODUCTS, JEHOVAH'S WITNESS On eliquis     Anesthesia Other Findings   Reproductive/Obstetrics                             Anesthesia Physical Anesthesia Plan  ASA: 4  Anesthesia Plan: General   Post-op Pain Management: Regional block*   Induction: Intravenous  PONV Risk Score and Plan: 3 and Treatment may vary due to age or medical condition, Ondansetron , Dexamethasone  and Midazolam   Airway Management Planned: Oral ETT  Additional Equipment: None  Intra-op Plan:   Post-operative Plan: Extubation in OR  Informed Consent: I have reviewed the patients History and Physical, chart, labs and discussed the procedure including the risks, benefits and alternatives for the proposed anesthesia with the patient or authorized representative who has indicated his/her understanding and acceptance.      Dental advisory given  Plan Discussed with: CRNA and Anesthesiologist  Anesthesia Plan Comments: (PAT note   )       Anesthesia Quick Evaluation

## 2023-08-10 ENCOUNTER — Ambulatory Visit: Payer: 59 | Admitting: Neurology

## 2023-08-10 NOTE — H&P (Signed)
 PREOPERATIVE H&P  Chief Complaint: OA RIGHT SHOULDER  HPI: Connie Hoffman is a 63 y.o. female who is scheduled for Procedure(s): ARTHROPLASTY, SHOULDER, TOTAL, REVERSE.   The patient is a 63 year old who has had previous injections and physical therapy by Dr. Marland Silvas.  She had a right rotator cuff repair done in 2004 at an outside location.  She has night pain.  She has tried and failed injections and physical therapy.  Of note, her medical history is quite significant.  She has well-controlled diabetes.  Her BMI is just under 50.  She had an unprovoked PE.  She had an open cuff repair with a metal anchor using a transverse atypical incision just along the acromion.    Symptoms are rated as moderate to severe, and have been worsening.  This is significantly impairing activities of daily living.    Please see clinic note for further details on this patient's care.    She has elected for surgical management.   Past Medical History:  Diagnosis Date   Anemia    Anxiety    Chest pain syndrome    Chronic stable angina (HCC)    DDD (degenerative disc disease), lumbar    Gastritis    Headache    Hematuria    resolved after removing ASA, only on Eliquis  now   History of hiatal hernia    Hypertension    Intestinal abscess    MRSA infection    After knee surgery   Observed seizure-like activity (HCC)    Pre-diabetes    Precordial pain    Psoriatic arthritis (HCC)    Pulmonary embolism (HCC)    Bilateral lungs   Refusal of blood transfusions as patient is Jehovah's Witness    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CC)    Sacroiliac pain    Seizure (HCC)    Spinal stenosis    Thyroid nodule    Past Surgical History:  Procedure Laterality Date   ABDOMINAL HYSTERECTOMY     ARTHRODESIS FOOT WITH WEIL OSTEOTOMY Right 03/26/2022   Procedure: GASTROCNEMIUS RECESSION, CALCANEAL EXOSTECTOMY;  Surgeon: Donnamarie Gables, MD;  Location: [facility] OR;  Service: Orthopedics;  Laterality: Right;   BACK  SURGERY  05/18/2019   L4 and L5   CARDIAC CATHETERIZATION     CHOLECYSTECTOMY     CYSTECTOMY  07/2017   FOOT SURGERY Right 05/08/2022   JOINT REPLACEMENT Left    left knee   LAPAROSCOPIC ENDOMETRIOSIS FULGURATION  2011   LAPAROSCOPIC GASTRIC SLEEVE RESECTION  2018   LAPAROSCOPIC OOPHERECTOMY Right    LUMBAR EPIDURAL INJECTION  04/13/2018   MINOR HARDWARE REMOVAL Right 07/07/2022   Procedure: RIGHT ANKLE DEEP HARDWARE REMOVAL;  Surgeon: Donnamarie Gables, MD;  Location: [facility] OR;  Service: Orthopedics;  Laterality: Right;   REVISION TOTAL KNEE ARTHROPLASTY Left 12/2017   TENOLYSIS Right 03/26/2022   Procedure: RIGHT ACHILLES TENDON RECONSTRUCTION AND DEBRIDEMENT;  Surgeon: Donnamarie Gables, MD;  Location: [facility] OR;  Service: Orthopedics;  Laterality: Right;  LENGTH OF SURGERY: 120 MINUTES   TOTAL SHOULDER ARTHROPLASTY Bilateral    TRIGGER FINGER RELEASE Bilateral 2023   bilateral 3rd digit   TUBAL LIGATION     postpartum   ULNAR NERVE REPAIR Bilateral 2023   Social History   Socioeconomic History   Marital status: Married    Spouse name: Not on file   Number of children: Not on file   Years of education: Not on file   Highest education level: Not on file  Occupational History   Not on file  Tobacco Use   Smoking status: Never    Passive exposure: Past   Smokeless tobacco: Never  Vaping Use   Vaping status: Never Used  Substance and Sexual Activity   Alcohol use: Never   Drug use: Never   Sexual activity: Yes    Birth control/protection: Surgical, None  Other Topics Concern   Not on file  Social History Narrative   Right handed    Lives with husband and 1 daughters    Social Drivers of Corporate investment banker Strain: Low Risk  (01/05/2023)   Received from [facility]   Overall Financial Resource Strain (CARDIA)    Difficulty of Paying Living Expenses: Not hard at all  Food Insecurity: Low Risk  (02/22/2023)   Received from [facility]   Hunger Vital  Sign    Worried About Running Out of Food in the Last Year: Never true    Ran Out of Food in the Last Year: Never true  Transportation Needs: No Transportation Needs (02/22/2023)   Received from [facility]    In the past 12 months, has lack of reliable transportation kept you from medical appointments, meetings, work or from getting things needed for daily living? : No  Physical Activity: Unknown (01/05/2023)   Received from [facility]   Exercise Vital Sign    Days of Exercise per Week: 0 days    Minutes of Exercise per Session: Not on file  Stress: No Stress Concern Present (01/05/2023)   Received from [facility] - Occupational Stress Questionnaire    Feeling of Stress : Only a little  Social Connections: Socially Integrated (01/05/2023)   Received from [facility]   Social Network    How would you rate your social network (family, work, friends)?: Good participation with social networks   Family History  Problem Relation Age of Onset   Diabetes Mother    Hypertension Mother    Heart Problems Mother    Congestive Heart Failure Mother    Diabetes Father    Hypertension Father    Psoriasis Father    Stroke Sister    Heart Problems Sister    Seizures Sister    Prostate cancer Brother    Diabetes Brother    Asthma Daughter    Healthy Daughter    Healthy Son    Healthy Son    Allergies  Allergen Reactions   Nsaids     GI bleeding    Other     Refuses blood products   Tylenol  [Acetaminophen ] Other (See Comments)    Raised patient's liver enzymes   Prior to Admission medications   Medication Sig Start Date End Date Taking? Authorizing Provider  albuterol  (VENTOLIN  HFA) 108 (90 Base) MCG/ACT inhaler Inhale 2 puffs into the lungs every 6 (six) hours as needed for wheezing. 07/10/21  Yes [provider]  atorvastatin  (LIPITOR ) 80 MG tablet Take 80 mg by mouth at bedtime.   Yes [provider]  dexlansoprazole (DEXILANT) 60 MG capsule Take 60 mg by mouth daily.   Yes [provider]  gabapentin  (NEURONTIN ) 300 MG capsule Take 2 capsules (600 mg total) by mouth 2 (two) times daily. 09/10/22  Yes Patel, Donika K, DO  isosorbide  mononitrate (IMDUR ) 120 MG 24 hr tablet Take 120 mg by mouth daily.   Yes [provider]  Menthol, Topical Analgesic, (BIOFREEZE) 4 % GEL  Apply 1 application  topically daily as needed (pain). Apply to painful sites daily as directed   Yes [provider]  Menthol, Topical Analgesic, (ICY HOT EX) Apply 1 Application topically daily as needed (pain).   Yes [provider]  methocarbamol  (ROBAXIN ) 500 MG tablet Take 500 mg by mouth every 6 (six) hours as needed for muscle spasms. 05/27/23  Yes [provider]  metoprolol  succinate (TOPROL -XL) 50 MG 24 hr tablet Take 50 mg by mouth daily.   Yes [provider]  nitroGLYCERIN  (NITROSTAT ) 0.4 MG SL tablet Place 0.4 mg under the tongue every 5 (five) minutes as needed for chest pain. 02/15/21  Yes [provider]  Polyethylene Glycol 400 (BLINK TEARS) 0.25 % SOLN Place 1 drop into both eyes 3 (three) times daily as needed (Dry eyes).   Yes [provider]  sertraline (ZOLOFT) 50 MG tablet Take 1 tablet by mouth daily. 07/28/23 08/27/23 Yes [provider]  triamcinolone cream (KENALOG) 0.1 % Apply 1 Application topically 2 (two) times daily. 04/04/23  Yes [provider]  apixaban  (ELIQUIS ) 2.5 MG TABS tablet Take 2.5 mg by mouth 2 (two) times daily. 04/25/21   [provider]  Blood Glucose Monitoring Suppl DEVI 1 each by Does not apply route in the morning, at noon, and at bedtime. May substitute to any manufacturer covered by patient's insurance. 07/09/22   Ozell Blunt, MD  spironolactone (ALDACTONE) 25 MG tablet Take 1 tablet by mouth daily. 07/01/23 06/30/24  [provider]    ROS: All other systems have been  reviewed and were otherwise negative with the exception of those mentioned in the HPI and as above.  Physical Exam: General: Alert, no acute distress Cardiovascular: No pedal edema Respiratory: No cyanosis, no use of accessory musculature GI: No organomegaly, abdomen is soft and non-tender Skin: No lesions in the area of chief complaint Neurologic: Sensation intact distally Psychiatric: Patient is competent for consent with normal mood and affect Lymphatic: No axillary or cervical lymphadenopathy  MUSCULOSKELETAL:  Range of motion of the right shoulder is to 90 degrees and passively to 130 degrees limited by pain.  Cuff strength is weak throughout.  Imaging: On review of the MRI, she has a near full thickness re-tear of her supraspinatus.  There are areas of full thickness cartilage loss including anterior-inferior glenoid.    BMI: Estimated body mass index is 49.76 kg/m as calculated from the following:   Height as of 08/05/23: 5\' 5"  (1.651 m).   Weight as of 08/05/23: 135.6 kg.  Lab Results  Component Value Date   ALBUMIN 3.0 (L) 07/08/2022   Diabetes:   Patient has a diagnosis of diabetes,  Lab Results  Component Value Date   HGBA1C 7.3 (H) 07/06/2022   Smoking Status:   reports that she has never smoked. She has been exposed to tobacco smoke. She has never used smokeless tobacco.     Assessment: OA RIGHT SHOULDER  Plan: Plan for Procedure(s): ARTHROPLASTY, SHOULDER, TOTAL, REVERSE  The patient is not a great candidate for revision cuff repair.  At best, she would likely have stiffness and pain.  The risks, benefits, and alternatives of reverse total arthroplasty were discussed.  All questions were answered.  We will work on surgical scheduling.  Specific risks including deltoid dehiscence, infection, and wound complications were all discussed further.    The risks benefits and alternatives were discussed with the patient including but not limited to the risks of  nonoperative  treatment, versus surgical intervention including infection, bleeding, nerve injury,  blood clots, cardiopulmonary complications, morbidity, mortality, among others, and they were willing to proceed.   We additionally specifically discussed risks of axillary nerve injury, infection, periprosthetic fracture, continued pain and longevity of implants prior to beginning procedure.    Patient will be closely monitored in PACU for medical stabilization and pain control. If found stable in PACU, patient may be discharged home with outpatient follow-up. If any concerns regarding patient's stabilization patient will be admitted for observation after surgery. The patient is planning to be discharged home with outpatient PT.   The patient acknowledged the explanation, agreed to proceed with the plan and consent was signed.   Patient received operative clearance from her PCP, Dr. Elnita Hai, her pain management provider, DR. Raulkar, her neurologist, Dr. Lydia Sams, and her cardiologist. She did have a recent hospitalization. This was discussed with the patient and her PCP at length. She feels like she is in a good mental state to proceed with surgery.   Operative Plan: right reverse total shoulder arthroplasty Discharge Medications: standard (short term pain prescription -> then resume standard) DVT Prophylaxis: resume Eliquis  Physical Therapy: outpatient PT Special Discharge needs: Sling. IceMan   Adine Ahmadi, PA-C  08/10/2023 8:40 AM

## 2023-08-17 NOTE — Discharge Instructions (Signed)
 Connie Hoffman, Connie Hoffman [name], PA-C [facility] [address], [address] [phone] (tel)   [phone] (fax)   POST-OPERATIVE INSTRUCTIONS - TOTAL SHOULDER REPLACEMENT    WOUND CARE You may leave the operative dressing in place until your follow-up appointment. KEEP THE INCISIONS CLEAN AND DRY. There may be a small amount of fluid/bleeding leaking at the surgical site. This is normal after surgery.  If it fills with liquid or blood please call us  immediately to change it for you. Use the provided ice machine or Ice packs as often as possible for the first 3-4 days, then as needed for pain relief.   Keep a layer of cloth or a shirt between your skin and the cooling unit to prevent frost bite as it can get very cold.  SHOWERING: - You may shower on Post-Op Day #2.  - The dressing is water  resistant but do not scrub it as it may start to peel up.   - You may remove the sling for showering - Gently pat the area dry.  - Do not soak the shoulder in water .  - Do not go swimming in the pool or ocean until your incision has completely healed (about 4-6 weeks after surgery) - KEEP THE INCISIONS CLEAN AND DRY.  EXERCISES Wear the sling at all times  You may remove the sling for showering, but keep the arm across the chest or in a secondary sling.    Accidental/Purposeful External Rotation and shoulder flexion (reaching behind you) is to be avoided at all costs for the first month. It is ok to come out of your sling if your are sitting and have assistance for eating.   Do not lift anything heavier than 1 pound until we discuss it further in clinic.  It is normal for your fingers/hand to become more swollen after surgery and discolored from bruising.   This will resolve over the first few weeks usually after surgery. Please continue to ambulate and do not stay sitting or lying for too long.  Perform foot and wrist pumps to assist in circulation.  PHYSICAL  THERAPY - You will begin physical therapy soon after surgery (unless otherwise specified) - Please call to set up an appointment, if you do not already have one  - Let our office if there are any issues with scheduling your therapy  - You have a PT appointment with [facility] (across the hall from our office) on 4/28  REGIONAL ANESTHESIA (NERVE BLOCKS) The anesthesia team may have performed a nerve block for you this is a great tool used to minimize pain.   The block may start wearing off overnight (between 8-24 hours postop) When the block wears off, your pain may go from nearly zero to the pain you would have had postop without the block. This is an abrupt transition but nothing dangerous is happening.   This can be a challenging period but utilize your as needed pain medications to try and manage this period. We suggest you use the pain medication the first night prior to going to bed, to ease this transition.  You may take an extra dose of narcotic when this happens if needed   POST-OP MEDICATIONS- Multimodal approach to pain control In general your pain will be controlled with a combination of substances.  Prescriptions unless otherwise discussed are electronically sent to your pharmacy.  This is a carefully made plan we use to minimize narcotic use.      Acetaminophen  -  Non-narcotic pain medicine taken on a scheduled basis  Oxycodone  - This is a strong narcotic, to be used only on an "as needed" basis for SEVERE pain. Zofran  -  take as needed for nausea  You will resume your Eliquis  24 hours after surgery   FOLLOW-UP If you develop a Fever (>101.5), Redness or Drainage from the surgical incision site, please call our office to arrange for an evaluation. Please call the office to schedule a follow-up appointment for a wound check, 7-10 days post-operatively.  IF YOU HAVE ANY QUESTIONS, PLEASE FEEL FREE TO CALL OUR OFFICE.  HELPFUL INFORMATION  Your arm will be in a sling following  surgery. You will be in this sling for the next 4 weeks.   You may be more comfortable sleeping in a semi-seated position the first few nights following surgery.  Keep a pillow propped under the elbow and forearm for comfort.  If you have a recliner type of chair it might be beneficial.  If not that is fine too, but it would be helpful to sleep propped up with pillows behind your operated shoulder as well under your elbow and forearm.  This will reduce pulling on the suture lines.  When dressing, put your operative arm in the sleeve first.  When getting undressed, take your operative arm out last.  Loose fitting, button-down shirts are recommended.  In most states it is against the law to drive while your arm is in a sling. And certainly against the law to drive while taking narcotics.  You may return to work/school in the next couple of days when you feel up to it. Desk work and typing in the sling is fine.  We suggest you use the pain medication the first night prior to going to bed, in order to ease any pain when the anesthesia wears off. You should avoid taking pain medications on an empty stomach as it will make you nauseous.  You should wean off your narcotic medicines as soon as you are able.     Most patients will be off narcotics before their first postop appointment.   Do not drink alcoholic beverages or take illicit drugs when taking pain medications.  Pain medication may make you constipated.  Below are a few solutions to try in this order: Decrease the amount of pain medication if you aren't having pain. Drink lots of decaffeinated fluids. Drink prune juice and/or each dried prunes  If the first 3 don't work start with additional solutions Take Colace - an over-the-counter stool softener Take Senokot - an over-the-counter laxative Take Miralax - a stronger over-the-counter laxative   Dental Antibiotics:  In most cases prophylactic antibiotics for Dental procdeures after  total joint surgery are not necessary.  Exceptions are as follows:  1. History of prior total joint infection  2. Severely immunocompromised (Organ Transplant, cancer chemotherapy, Rheumatoid biologic meds such as Humira)  3. Poorly controlled diabetes (A1C &gt; 8.0, blood glucose over 200)  If you have one of these conditions, contact your surgeon for an antibiotic prescription, prior to your dental procedure.   For more information including helpful videos and documents visit our website:   [email or web address]

## 2023-08-18 ENCOUNTER — Ambulatory Visit (HOSPITAL_COMMUNITY): Payer: Self-pay | Admitting: Physician Assistant

## 2023-08-18 ENCOUNTER — Encounter (HOSPITAL_COMMUNITY)
Admission: RE | Disposition: A | Discharge status: Home / Self Care | Payer: Self-pay | Source: Home / Self Care | Attending: Orthopaedic Surgery

## 2023-08-18 ENCOUNTER — Ambulatory Visit (HOSPITAL_COMMUNITY)

## 2023-08-18 ENCOUNTER — Ambulatory Visit (HOSPITAL_COMMUNITY)
Admission: RE | Admit: 2023-08-18 | Discharge: 2023-08-18 | Disposition: A | Discharge status: Home / Self Care | Attending: Orthopaedic Surgery | Admitting: Orthopaedic Surgery

## 2023-08-18 ENCOUNTER — Encounter (HOSPITAL_COMMUNITY): Payer: Self-pay | Admitting: Orthopaedic Surgery

## 2023-08-18 ENCOUNTER — Ambulatory Visit (HOSPITAL_COMMUNITY): Payer: Self-pay | Admitting: Anesthesiology

## 2023-08-18 ENCOUNTER — Other Ambulatory Visit: Payer: Self-pay

## 2023-08-18 DIAGNOSIS — F32A Depression, unspecified: Secondary | ICD-10-CM | POA: Insufficient documentation

## 2023-08-18 DIAGNOSIS — I209 Angina pectoris, unspecified: Secondary | ICD-10-CM | POA: Insufficient documentation

## 2023-08-18 DIAGNOSIS — F419 Anxiety disorder, unspecified: Secondary | ICD-10-CM | POA: Diagnosis not present

## 2023-08-18 DIAGNOSIS — Z86711 Personal history of pulmonary embolism: Secondary | ICD-10-CM | POA: Diagnosis not present

## 2023-08-18 DIAGNOSIS — E6689 Other obesity not elsewhere classified: Secondary | ICD-10-CM | POA: Diagnosis not present

## 2023-08-18 DIAGNOSIS — Z79899 Other long term (current) drug therapy: Secondary | ICD-10-CM | POA: Insufficient documentation

## 2023-08-18 DIAGNOSIS — Z9889 Other specified postprocedural states: Secondary | ICD-10-CM | POA: Insufficient documentation

## 2023-08-18 DIAGNOSIS — E119 Type 2 diabetes mellitus without complications: Secondary | ICD-10-CM | POA: Insufficient documentation

## 2023-08-18 DIAGNOSIS — Z6841 Body Mass Index (BMI) 40.0 and over, adult: Secondary | ICD-10-CM | POA: Diagnosis not present

## 2023-08-18 DIAGNOSIS — I10 Essential (primary) hypertension: Secondary | ICD-10-CM | POA: Insufficient documentation

## 2023-08-18 DIAGNOSIS — M75101 Unspecified rotator cuff tear or rupture of right shoulder, not specified as traumatic: Secondary | ICD-10-CM | POA: Insufficient documentation

## 2023-08-18 DIAGNOSIS — K219 Gastro-esophageal reflux disease without esophagitis: Secondary | ICD-10-CM | POA: Insufficient documentation

## 2023-08-18 DIAGNOSIS — F418 Other specified anxiety disorders: Secondary | ICD-10-CM

## 2023-08-18 DIAGNOSIS — M1990 Unspecified osteoarthritis, unspecified site: Secondary | ICD-10-CM | POA: Insufficient documentation

## 2023-08-18 DIAGNOSIS — M19011 Primary osteoarthritis, right shoulder: Secondary | ICD-10-CM | POA: Diagnosis present

## 2023-08-18 DIAGNOSIS — Z9151 Personal history of suicidal behavior: Secondary | ICD-10-CM | POA: Diagnosis not present

## 2023-08-18 DIAGNOSIS — K449 Diaphragmatic hernia without obstruction or gangrene: Secondary | ICD-10-CM | POA: Insufficient documentation

## 2023-08-18 HISTORY — DX: Type 2 diabetes mellitus without complications: E11.9

## 2023-08-18 HISTORY — PX: REVERSE SHOULDER ARTHROPLASTY: SHX5054

## 2023-08-18 LAB — GLUCOSE, CAPILLARY
Glucose-Capillary: 184 mg/dL — ABNORMAL HIGH (ref 70–99)
Glucose-Capillary: 135 mg/dL — ABNORMAL HIGH (ref 70–99)

## 2023-08-18 SURGERY — ARTHROPLASTY, SHOULDER, TOTAL, REVERSE
Anesthesia: General | Site: Shoulder | Laterality: Right

## 2023-08-18 MED ORDER — PHENYLEPHRINE HCL (PRESSORS) 10 MG/ML IV SOLN
INTRAVENOUS | Status: AC
  Filled 2023-08-18: qty 1

## 2023-08-18 MED ORDER — ACETAMINOPHEN 500 MG PO TABS: 500.0000 mg | ORAL_TABLET | Freq: Three times a day (TID) | ORAL | 0 refills | Status: AC

## 2023-08-18 MED ORDER — PROPOFOL 10 MG/ML IV BOLUS
INTRAVENOUS | Status: DC | PRN
  Administered 2023-08-18: 150 mg via INTRAVENOUS

## 2023-08-18 MED ORDER — INSULIN ASPART 100 UNIT/ML IJ SOLN
0.0000 [IU] | INTRAMUSCULAR | Status: DC | PRN
  Administered 2023-08-18: 4 [IU] via SUBCUTANEOUS
  Filled 2023-08-18: qty 1

## 2023-08-18 MED ORDER — SODIUM CHLORIDE 0.9 % IV SOLN: 12.5000 mg | INTRAVENOUS | Status: DC | PRN

## 2023-08-18 MED ORDER — BUPIVACAINE HCL (PF) 0.5 % IJ SOLN
INTRAMUSCULAR | Status: DC | PRN
  Administered 2023-08-18: 15 mL via PERINEURAL

## 2023-08-18 MED ORDER — ROCURONIUM BROMIDE 100 MG/10ML IV SOLN
INTRAVENOUS | Status: DC | PRN
  Administered 2023-08-18: 60 mg via INTRAVENOUS

## 2023-08-18 MED ORDER — ORAL CARE MOUTH RINSE: 15.0000 mL | Freq: Once | OROMUCOSAL | Status: AC

## 2023-08-18 MED ORDER — DEXAMETHASONE SODIUM PHOSPHATE 10 MG/ML IJ SOLN
INTRAMUSCULAR | Status: DC | PRN
  Administered 2023-08-18: 4 mg via INTRAVENOUS

## 2023-08-18 MED ORDER — 0.9 % SODIUM CHLORIDE (POUR BTL) OPTIME
TOPICAL | Status: DC | PRN
Start: 2023-08-18 — End: 2023-08-18
  Administered 2023-08-18: 1000 mL

## 2023-08-18 MED ORDER — VANCOMYCIN HCL 1000 MG IV SOLR
INTRAVENOUS | Status: AC
  Filled 2023-08-18: qty 20

## 2023-08-18 MED ORDER — ROCURONIUM BROMIDE 10 MG/ML (PF) SYRINGE
PREFILLED_SYRINGE | INTRAVENOUS | Status: AC
  Filled 2023-08-18: qty 10

## 2023-08-18 MED ORDER — OXYCODONE HCL 5 MG PO TABS: ORAL_TABLET | ORAL | 0 refills | Status: AC

## 2023-08-18 MED ORDER — FENTANYL CITRATE PF 50 MCG/ML IJ SOSY
PREFILLED_SYRINGE | INTRAMUSCULAR | Status: AC
  Filled 2023-08-18: qty 2

## 2023-08-18 MED ORDER — LIDOCAINE HCL (PF) 2 % IJ SOLN
INTRAMUSCULAR | Status: AC
  Filled 2023-08-18: qty 5

## 2023-08-18 MED ORDER — AMISULPRIDE (ANTIEMETIC) 5 MG/2ML IV SOLN: 10.0000 mg | Freq: Once | INTRAVENOUS | Status: DC | PRN

## 2023-08-18 MED ORDER — OXYCODONE HCL 5 MG PO TABS
5.0000 mg | ORAL_TABLET | Freq: Once | ORAL | Status: AC | PRN
  Administered 2023-08-18: 5 mg via ORAL

## 2023-08-18 MED ORDER — FENTANYL CITRATE (PF) 100 MCG/2ML IJ SOLN
INTRAMUSCULAR | Status: AC
  Filled 2023-08-18: qty 2

## 2023-08-18 MED ORDER — MIDAZOLAM HCL 2 MG/2ML IJ SOLN
INTRAMUSCULAR | Status: AC
  Filled 2023-08-18: qty 2

## 2023-08-18 MED ORDER — BUPIVACAINE LIPOSOME 1.3 % IJ SUSP
INTRAMUSCULAR | Status: DC | PRN
  Administered 2023-08-18: 10 mL via PERINEURAL

## 2023-08-18 MED ORDER — FENTANYL CITRATE (PF) 100 MCG/2ML IJ SOLN
INTRAMUSCULAR | Status: DC | PRN
  Administered 2023-08-18 (×2): 50 ug via INTRAVENOUS

## 2023-08-18 MED ORDER — ONDANSETRON HCL 4 MG/2ML IJ SOLN
INTRAMUSCULAR | Status: DC | PRN
  Administered 2023-08-18: 4 mg via INTRAVENOUS

## 2023-08-18 MED ORDER — LACTATED RINGERS IV SOLN: INTRAVENOUS | Status: DC

## 2023-08-18 MED ORDER — PROPOFOL 1000 MG/100ML IV EMUL
INTRAVENOUS | Status: AC
  Filled 2023-08-18: qty 100

## 2023-08-18 MED ORDER — STERILE WATER FOR IRRIGATION IR SOLN
Status: DC | PRN
  Administered 2023-08-18: 1000 mL

## 2023-08-18 MED ORDER — CEFAZOLIN SODIUM-DEXTROSE 3-4 GM/150ML-% IV SOLN
3.0000 g | INTRAVENOUS | Status: AC
  Administered 2023-08-18: 3 g via INTRAVENOUS
  Filled 2023-08-18: qty 150

## 2023-08-18 MED ORDER — PHENYLEPHRINE 80 MCG/ML (10ML) SYRINGE FOR IV PUSH (FOR BLOOD PRESSURE SUPPORT)
PREFILLED_SYRINGE | INTRAVENOUS | Status: DC | PRN
  Administered 2023-08-18: 160 ug via INTRAVENOUS
  Administered 2023-08-18: 100 ug via INTRAVENOUS
  Administered 2023-08-18: 40 ug via INTRAVENOUS

## 2023-08-18 MED ORDER — LIDOCAINE HCL (CARDIAC) PF 100 MG/5ML IV SOSY
PREFILLED_SYRINGE | INTRAVENOUS | Status: DC | PRN
  Administered 2023-08-18: 60 mg via INTRAVENOUS

## 2023-08-18 MED ORDER — SODIUM CHLORIDE 0.9 % IR SOLN
Status: DC | PRN
  Administered 2023-08-18: 1000 mL

## 2023-08-18 MED ORDER — DEXAMETHASONE SODIUM PHOSPHATE 10 MG/ML IJ SOLN
INTRAMUSCULAR | Status: AC
  Filled 2023-08-18: qty 1

## 2023-08-18 MED ORDER — ONDANSETRON HCL 4 MG/2ML IJ SOLN
INTRAMUSCULAR | Status: AC
  Filled 2023-08-18: qty 2

## 2023-08-18 MED ORDER — VANCOMYCIN HCL 1 G IV SOLR
INTRAVENOUS | Status: DC | PRN
  Administered 2023-08-18: 1000 mg

## 2023-08-18 MED ORDER — OXYCODONE HCL 5 MG PO TABS
ORAL_TABLET | ORAL | Status: AC
  Filled 2023-08-18: qty 1

## 2023-08-18 MED ORDER — PHENYLEPHRINE 80 MCG/ML (10ML) SYRINGE FOR IV PUSH (FOR BLOOD PRESSURE SUPPORT)
PREFILLED_SYRINGE | INTRAVENOUS | Status: AC
Start: 2023-08-18 — End: ?
  Filled 2023-08-18: qty 10

## 2023-08-18 MED ORDER — MIDAZOLAM HCL 5 MG/5ML IJ SOLN
INTRAMUSCULAR | Status: DC | PRN
  Administered 2023-08-18: 2 mg via INTRAVENOUS

## 2023-08-18 MED ORDER — OXYCODONE HCL 5 MG/5ML PO SOLN: 5.0000 mg | Freq: Once | ORAL | Status: AC | PRN

## 2023-08-18 MED ORDER — SUGAMMADEX SODIUM 200 MG/2ML IV SOLN
INTRAVENOUS | Status: DC | PRN
  Administered 2023-08-18: 260 mg via INTRAVENOUS

## 2023-08-18 MED ORDER — ONDANSETRON HCL 4 MG PO TABS: 4.0000 mg | ORAL_TABLET | Freq: Three times a day (TID) | ORAL | 0 refills | Status: AC | PRN

## 2023-08-18 MED ORDER — PHENYLEPHRINE HCL-NACL 20-0.9 MG/250ML-% IV SOLN
INTRAVENOUS | Status: DC | PRN
  Administered 2023-08-18: 30 ug/min via INTRAVENOUS

## 2023-08-18 MED ORDER — CHLORHEXIDINE GLUCONATE 0.12 % MT SOLN
15.0000 mL | Freq: Once | OROMUCOSAL | Status: AC
  Administered 2023-08-18: 15 mL via OROMUCOSAL

## 2023-08-18 MED ORDER — TRANEXAMIC ACID-NACL 1000-0.7 MG/100ML-% IV SOLN
1000.0000 mg | INTRAVENOUS | Status: AC
  Administered 2023-08-18: 1000 mg via INTRAVENOUS
  Filled 2023-08-18: qty 100

## 2023-08-18 MED ORDER — FENTANYL CITRATE PF 50 MCG/ML IJ SOSY
25.0000 ug | PREFILLED_SYRINGE | INTRAMUSCULAR | Status: DC | PRN
  Administered 2023-08-18: 50 ug via INTRAVENOUS
  Administered 2023-08-18: 25 ug via INTRAVENOUS

## 2023-08-18 SURGICAL SUPPLY — 57 items
BAG COUNTER SPONGE SURGICOUNT (BAG) ×1 IMPLANT
BASEPLATE GLENOID RSA 3X25 0D (Shoulder) IMPLANT
BIT DRILL 3.2 PERIPHERAL SCREW (BIT) IMPLANT
BLADE SAW SGTL 73X25 THK (BLADE) ×1 IMPLANT
CHLORAPREP W/TINT 26 (MISCELLANEOUS) ×2 IMPLANT
CLSR STERI-STRIP ANTIMIC 1/2X4 (GAUZE/BANDAGES/DRESSINGS) ×1 IMPLANT
COOLER ICEMAN CLASSIC (MISCELLANEOUS) IMPLANT
COVER BACK TABLE 60X90IN (DRAPES) ×1 IMPLANT
COVER SURGICAL LIGHT HANDLE (MISCELLANEOUS) ×1 IMPLANT
CUP HUM SYS INSERT SZ 1/2 36 (Joint) IMPLANT
DRAPE C-ARM 42X120 X-RAY (DRAPES) IMPLANT
DRAPE INCISE IOBAN 66X45 STRL (DRAPES) ×1 IMPLANT
DRAPE POUCH INSTRU U-SHP 10X18 (DRAPES) ×1 IMPLANT
DRAPE SHEET LG 3/4 BI-LAMINATE (DRAPES) ×2 IMPLANT
DRAPE SURG ORHT 6 SPLT 77X108 (DRAPES) ×2 IMPLANT
DRESSING AQUACEL AG SP 3.5X6 (GAUZE/BANDAGES/DRESSINGS) IMPLANT
DRSG AQUACEL AG ADV 3.5X 6 (GAUZE/BANDAGES/DRESSINGS) ×1 IMPLANT
DRSG AQUACEL AG ADV 3.5X10 (GAUZE/BANDAGES/DRESSINGS) IMPLANT
ELECT BLADE TIP CTD 4 INCH (ELECTRODE) ×1 IMPLANT
ELECT PENCIL ROCKER SW 15FT (MISCELLANEOUS) ×1 IMPLANT
ELECT REM PT RETURN 15FT ADLT (MISCELLANEOUS) ×1 IMPLANT
FACESHIELD WRAPAROUND (MASK) ×3 IMPLANT
FACESHIELD WRAPAROUND OR TEAM (MASK) ×3 IMPLANT
GLOVE BIO SURGEON STRL SZ 6.5 (GLOVE) ×2 IMPLANT
GLOVE BIOGEL PI IND STRL 6.5 (GLOVE) ×1 IMPLANT
GLOVE BIOGEL PI IND STRL 8 (GLOVE) ×1 IMPLANT
GLOVE ECLIPSE 8.0 STRL XLNG CF (GLOVE) ×2 IMPLANT
GOWN STRL REUS W/ TWL LRG LVL3 (GOWN DISPOSABLE) ×2 IMPLANT
GUIDEWIRE GLENOID 2.5X220 (WIRE) IMPLANT
KIT BASIN OR (CUSTOM PROCEDURE TRAY) ×1 IMPLANT
KIT STABILIZATION SHOULDER (MISCELLANEOUS) ×1 IMPLANT
KIT TURNOVER KIT A (KITS) IMPLANT
MANIFOLD NEPTUNE II (INSTRUMENTS) ×1 IMPLANT
NS IRRIG 1000ML POUR BTL (IV SOLUTION) ×1 IMPLANT
PACK SHOULDER (CUSTOM PROCEDURE TRAY) ×1 IMPLANT
PAD COLD SHLDR WRAP-ON (PAD) IMPLANT
PIN GUIDE 3X75 SHOULDER (PIN) IMPLANT
RESTRAINT HEAD UNIVERSAL NS (MISCELLANEOUS) ×1 IMPLANT
SCREW 5.0X18 (Screw) IMPLANT
SCREW 5.5X22 (Screw) IMPLANT
SCREW BONE INTRNL SM 7 (Screw) IMPLANT
SCREW PERIPHERAL 30 (Screw) IMPLANT
SET HNDPC FAN SPRY TIP SCT (DISPOSABLE) ×1 IMPLANT
SLING ARM IMMOBILIZER XL (CAST SUPPLIES) IMPLANT
SPHERE GLENOID LAT REV 36 (Joint) IMPLANT
SPONGE T-LAP 4X18 ~~LOC~~+RFID (SPONGE) ×1 IMPLANT
STEM HUMERAL STD SHORT SIZE 2 (Orthopedic Implant) IMPLANT
SUCTION TUBE FRAZIER 12FR DISP (SUCTIONS) IMPLANT
SUT ETHIBOND 2 V 37 (SUTURE) ×1 IMPLANT
SUT ETHIBOND NAB CT1 #1 30IN (SUTURE) ×1 IMPLANT
SUT MNCRL AB 4-0 PS2 18 (SUTURE) ×1 IMPLANT
SUT VIC AB 0 CT1 36 (SUTURE) ×1 IMPLANT
SUT VIC AB 3-0 SH 27X BRD (SUTURE) ×1 IMPLANT
SUTURE FIBERWR #5 38 CONV NDL (SUTURE) ×2 IMPLANT
TOWEL OR 17X26 10 PK STRL BLUE (TOWEL DISPOSABLE) ×1 IMPLANT
TUBE SUCTION HIGH CAP CLEAR NV (SUCTIONS) ×1 IMPLANT
WATER STERILE IRR 1000ML POUR (IV SOLUTION) ×2 IMPLANT

## 2023-08-18 NOTE — Anesthesia Postprocedure Evaluation (Signed)
 Anesthesia Post Note  Patient: Connie Hoffman  Procedure(s) Performed: ARTHROPLASTY, SHOULDER, TOTAL, REVERSE (Right: Shoulder)     Patient location during evaluation: PACU Anesthesia Type: General Level of consciousness: awake and alert Pain management: pain level controlled Vital Signs Assessment: post-procedure vital signs reviewed and stable Respiratory status: spontaneous breathing, nonlabored ventilation and respiratory function stable Cardiovascular status: stable and blood pressure returned to baseline Anesthetic complications: no   No notable events documented.  Last Vitals:  Vitals:   08/18/23 0945 08/18/23 1000  BP: (!) 156/92 (!) 169/97  Pulse: 78 68  Resp: 20 (!) 22  Temp:    SpO2: 90% 92%    Last Pain:  Vitals:   08/18/23 1000  TempSrc:   PainSc: 5                  Juventino Oppenheim

## 2023-08-18 NOTE — Op Note (Signed)
 Orthopaedic Surgery Operative Note (CSN: [id number])  Connie Hoffman  24-May-1960 Date of Surgery: 08/18/2023   Diagnoses:  Irreparable right rotator cuff tear  Procedure: Right reverse total Shoulder Arthroplasty   Operative Finding Successful completion of planned procedure.  Difficult case secondary to patient's body habitus with a BMI of almost 50.  We had to secondarily cut the humerus to obtain good tension however retentive polyethylene had great stability.  Post-operative plan: The patient will be NWB in sling.  The patient will be will be discharged from PACU if continues to be stable as was plan prior to surgery.  DVT prophylaxis Aspirin 81 mg twice daily for 6 weeks.  Pain control with PRN pain medication preferring oral medicines.  Follow up plan will be scheduled in approximately 7 days for incision check and XR.  Physical therapy to start immediately.  Implants: Tornier perform humeral size 2 stem, 0 retentive polyethylene, 36+3 glenosphere with a 25+3 baseplate, short center post and 4 peripheral screws  Post-Op Diagnosis: Same Surgeons:Primary: Micheline Ahr, MD Assistants:Caroline McBane, PA-C Location: [facility] ROOM 09 Anesthesia: General with Exparel  Interscalene Antibiotics: Ancef  3g preop, Vancomycin  1000mg  locally Tourniquet time: None Estimated Blood Loss: 100 Complications: None Specimens: None Implants: Implant Name Type Inv. Item Serial No. Manufacturer Lot No. LRB No. Used Action  BASEPLATE GLENOID RSA 3X25 0D - [id number] Shoulder BASEPLATE GLENOID RSA 3X25 0D  [employer] [id number] Right 1 Implanted  SPHERE GLENOID LAT REV 36 - [id number] Joint SPHERE GLENOID LAT REV 36  [employer] [id number] Right 1 Implanted  SCREW BONE INTRNL SM 7 - [id number] Screw SCREW BONE INTRNL SM 7  [employer] [id number] Right 1 Implanted  SCREW 5.5X22 - [id number] Screw SCREW 5.5X22  [employer]  Right 1 Implanted  SCREW PERIPHERAL 30 - [id number] Screw SCREW PERIPHERAL 30   [employer]  Right 1 Implanted  SCREW 5.0X18 - [id number] Screw SCREW 5.0X18  [employer]  Right 2 Implanted  CUP HUM SYS INSERT SZ 1/2 36 - [id number] Joint CUP HUM SYS INSERT SZ 1/2 36  [employer] [id number] Right 1 Implanted  STEM HUMERAL STD SHORT SIZE 2 - [id number] Orthopedic Implant STEM HUMERAL STD SHORT SIZE 2  [employer] [id number] Right 1 Implanted    Indications for Surgery:   Connie Hoffman is a 63 y.o. female with irreparable rotator cuff tear.  Benefits and risks of operative and nonoperative management were discussed prior to surgery with patient/guardian(s) and informed consent form was completed.  Infection and need for further surgery were discussed as was prosthetic stability and cuff issues.  We additionally specifically discussed risks of axillary nerve injury, infection, periprosthetic fracture, continued pain and longevity of implants prior to beginning procedure.      Procedure:   The patient was identified in the preoperative holding area where the surgical site was marked. Block placed by anesthesia with exparel .  The patient was taken to the OR where a procedural timeout was called and the above noted anesthesia was induced.  The patient was positioned beachchair on allen table with spider arm positioner.  Preoperative antibiotics were dosed.  The patient's right shoulder was prepped and draped in the usual sterile fashion.  A second preoperative timeout was called.       Standard deltopectoral approach was performed with a #10 blade. We dissected down to the subcutaneous tissues and the cephalic vein was taken laterally with the deltoid. Clavipectoral fascia was incised in line with the incision. Deep retractors were placed. The  long of the biceps tendon was identified and there was significant tenosynovitis present.  Tenodesis was performed to the pectoralis tendon with #2 Ethibond. The remaining biceps was followed up into the rotator interval where it was released.    The subscapularis was taken down in a full thickness layer with capsule along the humeral neck extending inferiorly around the humeral head. We continued releasing the capsule directly off of the osteophytes inferiorly all the way around the corner. This allowed us  to dislocate the humeral head.    The rotator cuff was carefully examined and noted to be irreperably torn.  The decision was confirmed that a reverse total shoulder was indicated for this patient.  There were osteophytes along the inferior humeral neck. The osteophytes were removed with an osteotome and a rongeur.  Osteophytes were removed with a rongeur and an osteotome and the anatomic neck was well visualized.     A humeral cutting guide was used extra medullary with a pin to help control version. The version was set at 20 of retroversion. Humeral osteotomy was performed with an oscillating saw. The head fragment was passed off the back table.  A cut protector plate was placed.  The subscapularis was again identified and immediately we took care to palpate the axillary nerve anteriorly and verify its position with gentle palpation as well as the tug test.  We then released the SGHL with bovie cautery prior to placing a curved mayo at the junction of the anterior glenoid well above the axillary nerve and bluntly dissecting the subscapularis from the capsule.  We then carefully protected the axillary nerve as we gently released the inferior capsule to fully mobilize the subscapularis.  An anterior deltoid retractor was then placed as well as a small Hohmann retractor superiorly.  The glenoid was relatively intact in the setting of an irreparable cuff tear  The remaining labrum was removed circumferentially taking great care not to disrupt the posterior capsule.   The glenoid drill guide was placed and used to drill a guide pin in the center, inferior position. The glenoid face was then reamed concentrically over the guide wire. The  center hole was drilled over the guidepin in a near anatomic angle of version.  We selected a 25+3 baseplate however upon drilling the vault was perforated posteriorly.  We selected a short post and were able to place this and had good seating with 4 peripheral screws.   We turned attention back to the humeral side. The cut protector was removed.  We initially trialed however if it was too tight and we took 2-3 more millimeters of bone.  We used the perform humeral sizing block to select the appropriate size which for this patient was a 2.  We then placed our center pin and reamed over it concentrically obtaining appropriate inset.  We then used our lateralizing chisel to prepare the lateral aspect of the humerus.  At that point we selected the appropriate implant trialing a 2.  Using this trial implant we trialed multiple polyethylene sizes settling on a 0 which provided good stability and range of motion without excess soft tissue tension. The offset was dialed in to match the normal anatomy. The shoulder was trialed.  There was good ROM in all planes and the shoulder was stable with no inferior translation.  The real humeral implants were opened after again confirming sizes.  The trial was removed. #5 Fiberwire x4 sutures passed through the humeral neck for subscap repair. The humeral  component was press-fit obtaining a secure fit. The joint was reduced and thoroughly irrigated with pulsatile lavage. Subscap was repaired back with #5 Fiberwire sutures through bone tunnels. Hemostasis was obtained. The deltopectoral interval was reapproximated with #1 Ethibond. The subcutaneous tissues were closed with 2-0 Vicryl and the skin was closed with running monocryl.    The wounds were cleaned and dried and an Aquacel dressing was placed. The drapes taken down. The arm was placed into sling with abduction pillow. Patient was awakened, extubated, and transferred to the recovery room in stable condition. There were  no intraoperative complications. The sponge, needle, and attention counts were  correct at the end of the case.     [name], PA-C, present and scrubbed throughout the case, critical for completion in a timely fashion, and for retraction, instrumentation, closure.

## 2023-08-18 NOTE — Interval H&P Note (Signed)
 All questions answered, patient wants to proceed with procedure. ? ?

## 2023-08-18 NOTE — Transfer of Care (Signed)
 Immediate Anesthesia Transfer of Care Note  Patient: Connie Hoffman  Procedure(s) Performed: ARTHROPLASTY, SHOULDER, TOTAL, REVERSE (Right: Shoulder)  Patient Location: PACU  Anesthesia Type:General  Level of Consciousness: awake, alert , and oriented  Airway & Oxygen Therapy: Patient Spontanous Breathing and Patient connected to face mask oxygen  Post-op Assessment: Report given to RN and Post -op Vital signs reviewed and stable  Post vital signs: Reviewed and stable  Last Vitals:  Vitals Value Taken Time  BP 161/87 08/18/23 0915  Temp    Pulse 73 08/18/23 0916  Resp 27 08/18/23 0916  SpO2 96 % 08/18/23 0916  Vitals shown include unfiled device data.  Last Pain:  Vitals:   08/18/23 0629  TempSrc:   PainSc: 3          Complications: No notable events documented.

## 2023-08-18 NOTE — Anesthesia Procedure Notes (Signed)
 Anesthesia Regional Block: Interscalene brachial plexus block   Pre-Anesthetic Checklist: , timeout performed,  Correct Patient, Correct Site, Correct Laterality,  Correct Procedure, Correct Position, site marked,  Risks and benefits discussed,  Surgical consent,  Pre-op evaluation,  At surgeon's request and post-op pain management  Laterality: Right  Prep: chloraprep       Needles:  Injection technique: Single-shot  Needle Type: Echogenic Needle     Needle Length: 5cm  Needle Gauge: 21     Additional Needles:   Narrative:  Start time: 08/18/2023 7:01 AM End time: 08/18/2023 7:04 AM Injection made incrementally with aspirations every 5 mL.  Performed by: Personally  Anesthesiologist: Juventino Oppenheim, MD  Additional Notes: No pain on injection. No increased resistance to injection. Injection made in 5cc increments. Good needle visualization. Patient tolerated the procedure well.

## 2023-08-18 NOTE — Progress Notes (Signed)
 Pt scores high on the suicide assessment due to recent behavorial health admission , pt denies any current suicidal plan or thoughts today. Dr brock aware .

## 2023-08-18 NOTE — Anesthesia Procedure Notes (Signed)
 Procedure Name: Intubation Date/Time: 08/18/2023 8:11 AM  Performed by: Norvell Beers, CRNAPre-anesthesia Checklist: Patient identified, Emergency Drugs available, Suction available and Patient being monitored Patient Re-evaluated:Patient Re-evaluated prior to induction Oxygen Delivery Method: Circle system utilized Preoxygenation: Pre-oxygenation with 100% oxygen Induction Type: IV induction Ventilation: Mask ventilation without difficulty Laryngoscope Size: Mac and 3 Grade View: Grade I Tube type: Oral Tube size: 7.5 mm Airway Equipment and Method: Stylet Placement Confirmation: positive ETCO2, ETT inserted through vocal cords under direct vision and breath sounds checked- equal and bilateral Secured at: 22 cm Tube secured with: Tape Dental Injury: Teeth and Oropharynx as per pre-operative assessment

## 2023-08-18 NOTE — Progress Notes (Signed)
 Post op phone call: patient to resume Eliquis  24 hours post surgery.

## 2023-08-19 ENCOUNTER — Encounter (HOSPITAL_COMMUNITY): Payer: Self-pay | Admitting: Orthopaedic Surgery

## 2023-09-07 ENCOUNTER — Other Ambulatory Visit: Payer: Self-pay | Admitting: Neurology

## 2023-09-07 DIAGNOSIS — G43109 Migraine with aura, not intractable, without status migrainosus: Secondary | ICD-10-CM

## 2023-09-28 ENCOUNTER — Ambulatory Visit: Payer: 59 | Admitting: Rheumatology

## 2023-11-19 NOTE — Progress Notes (Signed)
 Chief Complaint  Patient presents with  . Psoriasis    SUBJECTIVE: Connie Hoffman is a 63 y.o. female who is a return patient to the clinic. Patient presents for follow of  intertrigo/ inverse psoriasis. Flaring up int he last couple of months, exacerbated by  to weather conditions (summer) . She has hx of psoriatic arthritis on current treatment with Otelza. Previous treatment include Cosentyx  and Methotrexate ,    ROS:  Constitutional: The patient states that they otherwise feel well.  Skin: Negative for any other lumps, bumps, or skin concerns.   Past medical history, family history, and social history is noted in the encounter and has been updated and reviewed.   Past Medical History Medical History[1] Family History Family History[2] Social History Social History   Socioeconomic History  . Marital status: Married    Spouse name: Not on file  . Number of children: Not on file  . Years of education: Not on file  . Highest education level: Not on file  Occupational History  . Not on file  Tobacco Use  . Smoking status: Never    Passive exposure: Past  . Smokeless tobacco: Never  Substance and Sexual Activity  . Alcohol use: No  . Drug use: No  . Sexual activity: Not on file    Comment: HYSTERECTOMY  Other Topics Concern  . Not on file  Social History Narrative  . Not on file   Social Drivers of Health   Food Insecurity: Low Risk  (02/22/2023)   Food vital sign   . Within the past 12 months, you worried that your food would run out before you got money to buy more: Never true   . Within the past 12 months, the food you bought just didn't last and you didn't have money to get more: Never true  Transportation Needs: No Transportation Needs (02/22/2023)   Transportation   . In the past 12 months, has lack of reliable transportation kept you from medical appointments, meetings, work or from getting things needed for daily living? : No  Safety: Low Risk  (10/04/2023)    Safety   . How often does anyone, including family and friends, physically hurt you?: Never   . How often does anyone, including family and friends, insult or talk down to you?: Never   . How often does anyone, including family and friends, threaten you with harm?: Never   . How often does anyone, including family and friends, scream or curse at you?: Never  Living Situation: Low Risk  (02/22/2023)   Living Situation   . What is your living situation today?: I have a steady place to live   . Think about the place you live. Do you have problems with any of the following? Choose all that apply:: None/None on this list   OBJECTIVE: Ht 1.651 m (5' 5)   Wt 134 kg (295 lb)   BMI 49.09 kg/m   Patient is a pleasant  63 y.o. female who appears A&Ox3 with appropriate mood and affect. Declines full skin examination today.  Skin examination was performed via inspection and palpation of the scalp, head (face, ears), eyelids, lips, neck, chest, abdomen, back, bilateral upper extremities, bilateral hands, fingers, and fingernails. The areas covered by the patient's undergarments were not examined today.  Significant exam findings include:  Erythematous plaques on on retroauricular areas, axillae, inframammary area and abdominal folds  There are no other significant exam findings on patient's skin exam today  ASSESSMENT:  1.  Erythema intertrigo  silver sulfADIAZINE (SILVADENE) 1 % cream   triamcinolone acetonide (KENALOG) 0.1 % cream    2. Psoriasis         PLAN: Extensive counseling was provided to the patient regarding the diagnosis and treatment options. Advised to apply Zeasorb powder or Mycostatin gel  Prescribed: - silver sulfADIAZINE (SILVADENE) 1 % cream; Apply topically daily. To affected areas  Dispense: 400 g; Refill: 11 - triamcinolone acetonide (KENALOG) 0.1 % cream; Apply topically 2 (two) times a day. To rash as needed for itching . Do not apply to face or groin area  Dispense:  453 g; Refill: 6  Side effects and appropriate use of all the medication(s) were discussed with the patient today. Risks, benefits, and alternatives were discussed. Patient advised to use the medication(s) as directed by their healthcare provider. The patient was encouraged to read, review, and understand all associated package inserts and contact our office with any questions or concerns. The patient accepts the risks of the treatment plan and had an opportunity to ask questions.  Advised monthly self total body skin examination and for the patient to report any new or changing lesions. The patient was encouraged to call or send a message through [facility] for any questions or concerns.  Follow up 1 year or sooner as needed  Electronically signed by: Hildegard Doyal LILLETTE [name], NP 11/21/2023 10:43 AM   Orders Placed This Encounter  Medications  . silver sulfADIAZINE (SILVADENE) 1 % cream    Sig: Apply topically daily. To affected areas    Dispense:  400 g    Refill:  11  . triamcinolone acetonide (KENALOG) 0.1 % cream    Sig: Apply topically 2 (two) times a day. To rash as needed for itching . Do not apply to face or groin area    Dispense:  453 g    Refill:  6    No orders of the defined types were placed in this encounter.   Scheduled Future Appointments       Provider Department Dept Phone Center   11/29/2023 3:00 PM Mabel Ned [facility] - [place] D[place] [phone] [facility] [place]   12/15/2023 11:00 AM Arlyne Kennyth Dawson [facility]  - Internal Medicine [place] [phone] [facility] [address]   01/03/2024 2:00 PM Muna Marine [place] [facility] - Cardiology [place] [phone] [facility] [place]   01/18/2024 12:30 PM [facility] HEM ONC HP PERIPHERAL LAB [facility] [phone] Hematology Oncology [phone] [facility] [place]   01/18/2024 1:00 PM Zachary Elza [place]  [facility] [phone] Hematology Oncology [phone] [facility] [place]   02/01/2024 2:15 PM [facility] MG1 [facility] - Radiology Mammogram [phone] [facility]   02/01/2024 2:30 PM [facility] US2 [facility] - RADIOLOGY US  [phone] [facility]             [1] Past Medical History: Diagnosis Date  . 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 Adjustment disorder with mixed anxiety and depressed mood 12/17/2020  . Allergy   . Anemia   . Arthritis   . Arthritis of knee, right 11/03/2017  . Chest pain 03/11/2021   Added automatically from request for surgery [id number]  . Chest pain syndrome 03/21/2021  . Chronic pain of both shoulders 10/01/2017  . Chronic pain of  left knee 10/01/2017  . Class 3 severe obesity due to excess calories with serious comorbidity and body mass index (BMI) of 45.0 to 49.9 in adult 12/17/2020  . DDD (degenerative disc disease), lumbar   . Eczema   . Gastritis   . Headache   . High risk medication use 12/01/2017   Overview:  MTX 6 tablets every Wednesday and folic acid 1 mg daily 06/24/17: HLA-B27 -, RF 11, ANA -, CCP 14, CRP 15.6, sed rate 23, Vitamin D 36.8, TB gold negative  . History of bariatric surgery 10/01/2017  . Idiopathic intracranial hypertension 04/01/2021  . Inverse psoriasis 10/01/2017  . Lumbar radiculopathy 12/30/2017  . Midline cystocele 05/23/2020  . Pelvic mass 12/04/2019  . Postinflammatory hyperpigmentation 07/23/2020  . Postoperative stiffness of total knee replacement 11/12/2017  . Precordial pain 03/11/2021   Added automatically from request for surgery [id number]  . Psoriasis 10/01/2017  . Psoriatic arthritis    (CMD) 10/01/2017   Overview:  Moving from [place] discussed initiating Humira but she moved before starting  . Refusal of blood transfusions as patient is Jehovah's Witness   . 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 Spinal stenosis    . Status post revision of total replacement of left knee 12/14/2017  . Status post total left knee replacement 10/01/2017  . Thrombocytosis 10/01/2017  . Thyroid nodule 05/28/2020  . Transient synovitis of right hip 06/05/2018  . Varicella   [2] Family History Problem Relation Name Age of Onset  . Hypertension Mother    . Heart failure Mother    . Diabetes Mother    . Cataracts Mother    . Heart disease Mother    . Diabetes Father    . Cataracts Father    . Hypertension Father    . Diabetes Sister    . Diabetes Brother    . Cataracts Brother    . Rheumatic fever Brother    . Heart attack Brother    . Eczema Neg Hx    . Psoriasis Neg Hx    . Glaucoma Neg Hx    . Breast cancer Neg Hx

## 2023-12-02 ENCOUNTER — Other Ambulatory Visit: Payer: Self-pay

## 2023-12-02 ENCOUNTER — Ambulatory Visit: Admitting: Obstetrics and Gynecology

## 2023-12-02 ENCOUNTER — Other Ambulatory Visit (HOSPITAL_COMMUNITY)
Admission: RE | Admit: 2023-12-02 | Discharge: 2023-12-02 | Disposition: A | Discharge status: Home / Self Care | Source: Ambulatory Visit | Attending: Obstetrics and Gynecology | Admitting: Obstetrics and Gynecology

## 2023-12-02 VITALS — BP 137/84 | HR 66 | Wt 288.3 lb

## 2023-12-02 DIAGNOSIS — R102 Pelvic and perineal pain: Secondary | ICD-10-CM | POA: Diagnosis not present

## 2023-12-02 DIAGNOSIS — N812 Incomplete uterovaginal prolapse: Secondary | ICD-10-CM | POA: Diagnosis not present

## 2023-12-02 LAB — POCT URINALYSIS DIP (DEVICE)
Bilirubin Urine: NEGATIVE
Glucose, UA: NEGATIVE mg/dL
Hgb urine dipstick: NEGATIVE
Ketones, ur: NEGATIVE mg/dL
Leukocytes,Ua: NEGATIVE
Nitrite: NEGATIVE
Protein, ur: 30 mg/dL — AB
Specific Gravity, Urine: >=1.03 (ref 1.005–1.030)
Urobilinogen, UA: 0.2 mg/dL (ref 0.0–1.0)
pH: 6 (ref 5.0–8.0)

## 2023-12-02 NOTE — Progress Notes (Signed)
 Patient complains of pelvic pain on right side. She describes the pain as sharp. Stelzner stated that the pain is daily but pain level varies throughout the day.   Herzig informed me that she had a partial hysterectomy between years 2013-2015

## 2023-12-02 NOTE — Progress Notes (Signed)
 Obstetrics and Gynecology New Patient Evaluation  Appointment Date: 12/02/2023  OBGYN Clinic: [facility] for [facility]  Primary Care Provider: Evangelina Tinnie Norris  Referring Provider: self  Chief Complaint: pelvic pressure  History of Present Illness: Connie Hoffman is a 63 y.o. African-American G3P3 (No LMP recorded. Patient is postmenopausal.), seen for the above chief complaint. Her past medical history is significant for BMI 40s, ? h/o supracervical hysterectomy, h/o endometriosis, h/o gastric bypass.   Patient currently endorsing pelvic pressure.   Patient last seen by me early 2022 for virtual visit. I re-iterated to her that I recommended she see urogyn and have a colonoscopy. At that time, she had anterior vaginal wall prolapse to 0 with valsalva. She states that she thinks she saw urology but I told her all I could find was a urology work up in early 2023 for gross hematuria, which appears to negative.  It appears that she had a colonoscopy with [facility] in late 2022.   Prior history Patient intially seen by me on 11/2019 as ED referral for pelvic masses seen on imaging. She states that she had a hyste[place] in [place] but imaging showed what looked like a cervix and uterus.  Patient went to ED on 6/27 for several week h/o lower belly pain. She states it feels similar to prior pains she had before her hysterectomy in [place] in 2014. In the ED, CT showed what looked to be 2cm small fibroids in what appeared to be a uterus. F/u u/s showed similar findings or from the cervix; see reports below. Cmp, u/a, cbc, lipase were negative. GYN consulted and recommended outpatient follow up  Surgical history is as follows: patient had a ppBTL. She then had endometriosis seen on laparoscopy and what sounds like fulguration in 2011, then she had pain similar to current pain now and had l/s to remove her right ovary because she states it was very large, but this didn't help so  she had a l/s hysterectomy which she thought they removed her cervix too. She believes her left ovary was left and she doesn't recall being told she had to have pap smears in the future. This and her right oophorectomy were done in 2014 at [facility] in [place], [place]. No records were able to be obtained and a pelvic mri suggests fibroids and uterus is present. She has a cervix seen on exam.   Patient states s/s are similar. Never any vaginal bleeding or discharge. No SUI s/s.   Review of Systems: Pertinent items are noted in HPI.    Past Medical History:  Past Medical History:  Diagnosis Date   Anemia    Anxiety    Chest pain syndrome    Chronic stable angina (HCC)    DDD (degenerative disc disease), lumbar    Diabetes mellitus without complication (HCC)    Gastritis    Headache    Hematuria    resolved after removing ASA, only on Eliquis  now   History of hiatal hernia    Hypertension    Intestinal abscess    MRSA infection    After knee surgery   Observed seizure-like activity (HCC)    Pre-diabetes    Precordial pain    Psoriatic arthritis (HCC)    Pulmonary embolism (HCC)    Bilateral lungs   Refusal of blood transfusions as patient is Jehovah's Witness    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CC)    Sacroiliac pain    Seizure (HCC)    Spinal  stenosis    Thyroid nodule     Past Surgical History:  Past Surgical History:  Procedure Laterality Date   ABDOMINAL HYSTERECTOMY     ARTHRODESIS FOOT WITH WEIL OSTEOTOMY Right 03/26/2022   Procedure: GASTROCNEMIUS RECESSION, CALCANEAL EXOSTECTOMY;  Surgeon: Elsa Lonni SAUNDERS, MD;  Location: [facility] OR;  Service: Orthopedics;  Laterality: Right;   BACK SURGERY  05/18/2019   L4 and L5   CARDIAC CATHETERIZATION     CHOLECYSTECTOMY     CYSTECTOMY  07/2017   FOOT SURGERY Right 05/08/2022   JOINT REPLACEMENT Left    left knee   LAPAROSCOPIC ENDOMETRIOSIS FULGURATION  2011   LAPAROSCOPIC GASTRIC SLEEVE RESECTION  2018   LAPAROSCOPIC  OOPHERECTOMY Right    LUMBAR EPIDURAL INJECTION  04/13/2018   MINOR HARDWARE REMOVAL Right 07/07/2022   Procedure: RIGHT ANKLE DEEP HARDWARE REMOVAL;  Surgeon: Elsa Lonni SAUNDERS, MD;  Location: [facility] OR;  Service: Orthopedics;  Laterality: Right;   REVERSE SHOULDER ARTHROPLASTY Right 08/18/2023   Procedure: ARTHROPLASTY, SHOULDER, TOTAL, REVERSE;  Surgeon: Cristy Bonner DASEN, MD;  Location: [facility] ORS;  Service: Orthopedics;  Laterality: Right;   REVISION TOTAL KNEE ARTHROPLASTY Left 12/2017   TENOLYSIS Right 03/26/2022   Procedure: RIGHT ACHILLES TENDON RECONSTRUCTION AND DEBRIDEMENT;  Surgeon: Elsa Lonni SAUNDERS, MD;  Location: [facility] OR;  Service: Orthopedics;  Laterality: Right;  LENGTH OF SURGERY: 120 MINUTES   TOTAL SHOULDER ARTHROPLASTY Bilateral    TRIGGER FINGER RELEASE Bilateral 2023   bilateral 3rd digit   TUBAL LIGATION     postpartum   ULNAR NERVE REPAIR Bilateral 2023    Past Obstetrical History:  OB History  Gravida Para Term Preterm AB Living  3     3  SAB IAB Ectopic Multiple Live Births      3    # Outcome Date GA Lbr Len/2nd Weight Sex Type Anes PTL Lv  3 Gravida           2 Gravida           1 Gravida             Obstetric Comments  Vaginal deliveries x 3.     Past Gynecological History: As per HPI. History of Pap Smear(s): 11/2019 negative and hpv negative.  History of HRT use: No.  Social History:  Social History   Socioeconomic History   Marital status: Married    Spouse name: Not on file   Number of children: Not on file   Years of education: Not on file   Highest education level: Not on file  Occupational History   Not on file  Tobacco Use   Smoking status: Never    Passive exposure: Past   Smokeless tobacco: Never  Vaping Use   Vaping status: Never Used  Substance and Sexual Activity   Alcohol use: Never   Drug use: Never   Sexual activity: Yes    Birth control/protection: Surgical, None  Other Topics Concern   Not on file  Social History  Narrative   Right handed    Lives with husband and 1 daughters    Social Drivers of Health   Financial Resource Strain: Low Risk  (01/05/2023)   Received from [facility]   Overall Financial Resource Strain (CARDIA)    Difficulty of Paying Living Expenses: Not hard at all  Food Insecurity: Low Risk  (02/22/2023)   Received from [facility]   Hunger Vital Sign    Within the past 12 months, you worried that  your food would run out before you got money to buy more: Never true    Within the past 12 months, the food you bought just didn't last and you didn't have money to get more. : Never true  Transportation Needs: No Transportation Needs (02/22/2023)   Received from [employer]    In the past 12 months, has lack of reliable transportation kept you from medical appointments, meetings, work or from getting things needed for daily living? : No  Physical Activity: Unknown (01/05/2023)   Received from [facility]   Exercise Vital Sign    On average, how many days per week do you engage in moderate to strenuous exercise (like a brisk walk)?: 0 days    Minutes of Exercise per Session: Not on file  Stress: No Stress Concern Present (01/05/2023)   Received from [facility] - Occupational Stress Questionnaire    Feeling of Stress : Only a little  Social Connections: Socially Integrated (01/05/2023)   Received from [facility]   Social Network    How would you rate your social network (family, work, friends)?: Good participation with social networks  Intimate Partner Violence: Not At Risk (01/05/2023)   Received from [facility]   HITS    Over the last 12 months how often did your partner physically hurt you?: Never    Over the last 12 months how often did your partner insult you or talk down to you?: Never    Over the last 12 months how often did your partner threaten you with physical harm?: Never    Over the last 12  months how often did your partner scream or curse at you?: Never    Family History:  Family History  Problem Relation Age of Onset   Diabetes Mother    Hypertension Mother    Heart Problems Mother    Congestive Heart Failure Mother    Diabetes Father    Hypertension Father    Psoriasis Father    Stroke Sister    Heart Problems Sister    Seizures Sister    Prostate cancer Brother    Diabetes Brother    Asthma Daughter    Healthy Daughter    Healthy Son    Healthy Son     Medications Connie Hoffman had no medications administered during this visit. Current Outpatient Medications  Medication Sig Dispense Refill   albuterol  (VENTOLIN  HFA) 108 (90 Base) MCG/ACT inhaler Inhale 2 puffs into the lungs every 6 (six) hours as needed for wheezing.     apixaban  (ELIQUIS ) 2.5 MG TABS tablet Take 2.5 mg by mouth 2 (two) times daily.     atorvastatin  (LIPITOR ) 80 MG tablet Take 80 mg by mouth at bedtime.     Blood Glucose Monitoring Suppl DEVI 1 each by Does not apply route in the morning, at noon, and at bedtime. May substitute to any manufacturer covered by patient's insurance. 1 each 0   dexlansoprazole (DEXILANT) 60 MG capsule Take 60 mg by mouth daily.     gabapentin  (NEURONTIN ) 300 MG capsule Take 2 capsules by mouth twice daily 120 capsule 3   isosorbide  mononitrate (IMDUR ) 120 MG 24 hr tablet Take 120 mg by mouth daily.     Menthol, Topical Analgesic, (BIOFREEZE) 4 % GEL Apply 1 application  topically daily as needed (pain). Apply to painful sites daily as directed     Menthol, Topical Analgesic, (ICY HOT EX) Apply  1 Application topically daily as needed (pain).     methocarbamol  (ROBAXIN ) 500 MG tablet Take 500 mg by mouth every 6 (six) hours as needed for muscle spasms.     metoprolol  succinate (TOPROL -XL) 50 MG 24 hr tablet Take 50 mg by mouth daily.     nitroGLYCERIN  (NITROSTAT ) 0.4 MG SL tablet Place 0.4 mg under the tongue every 5 (five) minutes as needed for chest pain.      Polyethylene Glycol 400 (BLINK TEARS) 0.25 % SOLN Place 1 drop into both eyes 3 (three) times daily as needed (Dry eyes).     sertraline (ZOLOFT) 50 MG tablet Take 1 tablet by mouth daily.     spironolactone (ALDACTONE) 25 MG tablet Take 1 tablet by mouth daily.     triamcinolone cream (KENALOG) 0.1 % Apply 1 Application topically 2 (two) times daily.     No current facility-administered medications for this visit.    Allergies Nsaids, Other, and Tylenol  [acetaminophen ]   Physical Exam:  BP 137/84   Pulse 66   Wt 288 lb 4.8 oz (130.8 kg)   BMI 47.98 kg/m  Body mass index is 47.98 kg/m. General appearance: Well nourished, well developed female in no acute distress.  Respiratory:  Normal respiratory effort Abdomen: soft, nttp, nd Neuro/Psych:  Normal mood and affect.  Skin:  Warm and dry.  Lymphatic:  No inguinal lymphadenopathy.   Pelvic exam: is limited by body habitus  EGBUS: within normal limits, mild to moderate atrophy Vagina: within normal limits, mild to moderate atrophy and with no blood or discharge in the vault. Mild to moderate anterior vag wall prolapse with valsalva to nearly the introitus Cervix: SEEN. normal appearing cervix without tenderness, discharge or lesions.  No suprapubic tenderness or masses felt Adnexa:  normal adnexa and no mass, fullness, tenderness Rectovaginal: deferred  Laboratory:  Poc u/a negative except for protein  Radiology: no new imaging 09/2022 ct angio a/p appear to show uterus  Assessment: pt stable  Plan:  1. Anterior vault and cervical prolapse Similar to worse than last exam in 2021. Will refer to urogyn. Not enough of a sample to send ucx.   Orders Placed This Encounter  Procedures   Ambulatory referral to Urogynecology   POCT urinalysis dip (device)   Bebe Izell Raddle MD Attending Center for [facility])

## 2023-12-03 LAB — CERVICOVAGINAL ANCILLARY ONLY
Chlamydia: NEGATIVE
Comment: NEGATIVE
Comment: NEGATIVE
Comment: NORMAL
Neisseria Gonorrhea: NEGATIVE
Trichomonas: NEGATIVE

## 2023-12-04 ENCOUNTER — Encounter: Payer: Self-pay | Admitting: Obstetrics and Gynecology

## 2024-01-10 ENCOUNTER — Encounter: Payer: Self-pay | Admitting: Neurology

## 2024-01-10 ENCOUNTER — Ambulatory Visit: Payer: 59 | Admitting: Neurology

## 2024-01-13 ENCOUNTER — Telehealth: Payer: Self-pay | Admitting: Neurology

## 2024-01-13 NOTE — Telephone Encounter (Signed)
 We are writing to inform you that [facility] Neurology, including providers within this practice, will no longer be able to provide medical care to you.  This decision is the result of repeated missed appointments with adequate notice, which has disrupted our ability to provide timely and effective care to all patients.

## 2024-01-14 NOTE — Telephone Encounter (Signed)
 I called, spoke w/Will and asked that he faxed the order. He agreed. I thanked him. End of conversation.

## 2024-03-17 ENCOUNTER — Ambulatory Visit: Admitting: Obstetrics

## 2024-03-17 NOTE — Progress Notes (Deleted)
 New Patient Evaluation and Consultation  Referring Provider: Izell Harari, MD PCP: Evangelina Tinnie Almarie DEVONNA Date of Service: 03/17/2024  SUBJECTIVE Chief Complaint: No chief complaint on file.  History of Present Illness: Connie Hoffman is a 63 y.o. Black or African-American female seen in consultation at the request of Dr Izell for evaluation of pelvic organ prolapse and pelvic pain.    ***Review of records significant for: ***diverticulitis, chronic anticoagulation for history of PE, anemia, T2DM. Seizures, psoriasis, history of pelvic abscess  Urinary Symptoms: Does not leak urine.  Leaks *** time(s) per {days/wks/mos/yrs:310907}.  Pad use: {NUMBERS 1-10:18281} {pad option:24752} per day.   Patient {ACTION; IS/IS WNU:78978602} bothered by UI symptoms.  Day time voids 5.  Nocturia: 3 times per night to void. Voiding dysfunction:  empties bladder well.  Patient does not use a catheter to empty bladder.  When urinating, patient feels she has no difficulties Drinks: ***oz water  per day  UTIs: 0 UTI's in the last year.   {ACTIONS;DENIES/REPORTS:21021675::Denies} history of blood in urine, kidney or bladder stones, pyelonephritis, bladder cancer, and kidney cancer No results found for the last 90 days.   Pelvic Organ Prolapse Symptoms:                  Patient Admits to a feeling of a bulge the vaginal area. It has been present for 3 months.  Patient Denies seeing a bulge.  This bulge is bothersome.  Bowel Symptom: Bowel movements: 2 time(s) per day Stool consistency: soft  Straining: no.  Splinting: no.  Incomplete evacuation: no.  Patient Denies accidental bowel leakage / fecal incontinence Bowel regimen: {bowel regimen:24759} Last colonoscopy: Date 8/22 ***, Results ***colitis HM Colonoscopy          Current Care Gaps     Colonoscopy (Every 10 Years) Never done   No completion history exists for this topic.                 Sexual  Function Sexually active: no.  Sexual orientation: Straight Pain with sex: {pain with sex:24762}  Pelvic Pain Admits to pelvic pain Location: right sided Pain occurs: walking and prolonged sitting Prior pain treatment: heat or muscle relaxer Improved by: heat or muscle relaxer Worsened by: moving and prolonged sitting   Past Medical History:  Past Medical History:  Diagnosis Date   Anemia    Anxiety    Chest pain syndrome    Chronic stable angina    DDD (degenerative disc disease), lumbar    Diabetes mellitus without complication (HCC)    Gastritis    Headache    Hematuria    resolved after removing ASA, only on Eliquis  now   History of hiatal hernia    Hypertension    Intestinal abscess    MRSA infection    After knee surgery   Observed seizure-like activity (HCC)    Pre-diabetes    Precordial pain    Psoriatic arthritis (HCC)    Pulmonary embolism (HCC)    Bilateral lungs   Refusal of blood transfusions as patient is Jehovah's Witness    Renal artery aneurysm    Sacroiliac pain    Seizure (HCC)    Spinal stenosis    Thyroid nodule      Past Surgical History:   Past Surgical History:  Procedure Laterality Date   ABDOMINAL HYSTERECTOMY     ARTHRODESIS FOOT WITH WEIL OSTEOTOMY Right 03/26/2022   Procedure: GASTROCNEMIUS RECESSION, CALCANEAL EXOSTECTOMY;  Surgeon: Elsa Lonni SAUNDERS, MD;  Location:  [facility] OR;  Service: Orthopedics;  Laterality: Right;   BACK SURGERY  05/18/2019   L4 and L5   CARDIAC CATHETERIZATION     CHOLECYSTECTOMY     CYSTECTOMY  07/2017   FOOT SURGERY Right 05/08/2022   JOINT REPLACEMENT Left    left knee   LAPAROSCOPIC ENDOMETRIOSIS FULGURATION  2011   LAPAROSCOPIC GASTRIC SLEEVE RESECTION  2018   LAPAROSCOPIC OOPHERECTOMY Right    LUMBAR EPIDURAL INJECTION  04/13/2018   MINOR HARDWARE REMOVAL Right 07/07/2022   Procedure: RIGHT ANKLE DEEP HARDWARE REMOVAL;  Surgeon: Elsa Lonni SAUNDERS, MD;  Location: [facility] OR;  Service: Orthopedics;   Laterality: Right;   REVERSE SHOULDER ARTHROPLASTY Right 08/18/2023   Procedure: ARTHROPLASTY, SHOULDER, TOTAL, REVERSE;  Surgeon: Cristy Bonner DASEN, MD;  Location: [facility] ORS;  Service: Orthopedics;  Laterality: Right;   REVISION TOTAL KNEE ARTHROPLASTY Left 12/2017   TENOLYSIS Right 03/26/2022   Procedure: RIGHT ACHILLES TENDON RECONSTRUCTION AND DEBRIDEMENT;  Surgeon: Elsa Lonni SAUNDERS, MD;  Location: [facility] OR;  Service: Orthopedics;  Laterality: Right;  LENGTH OF SURGERY: 120 MINUTES   TOTAL SHOULDER ARTHROPLASTY Bilateral    TRIGGER FINGER RELEASE Bilateral 2023   bilateral 3rd digit   TUBAL LIGATION     postpartum   ULNAR NERVE REPAIR Bilateral 2023     Past OB/GYN History: OB History  Gravida Para Term Preterm AB Living  3     3  SAB IAB Ectopic Multiple Live Births      3    # Outcome Date GA Lbr Len/2nd Weight Sex Type Anes PTL Lv  3 Gravida           2 Gravida           1 Gravida             Obstetric Comments  Vaginal deliveries x 3.     Vaginal deliveries: ***,  Forceps/ Vacuum deliveries: ***, Cesarean section: 0 Menopausal: Yes, at age 78, Denies vaginal bleeding since menopause Contraception: s/p menopause and hysterectomy. Last pap smear was ***.  Any history of abnormal pap smears: no.    Component Value Date/Time   DIAGPAP  12/04/2019 1406    - Negative for intraepithelial lesion or malignancy (NILM)   HPVHIGH Negative 12/04/2019 1406   ADEQPAP  12/04/2019 1406    Satisfactory for evaluation; transformation zone component PRESENT.    Medications: Patient has a current medication list which includes the following prescription(s): albuterol , apixaban , atorvastatin , blood glucose monitoring suppl, dexlansoprazole, gabapentin , isosorbide  mononitrate, biofreeze, menthol (topical analgesic), methocarbamol , metoprolol  succinate, nitroglycerin , blink tears, sertraline, spironolactone, and triamcinolone cream.   Allergies: Patient is allergic to nsaids, other, and  tylenol  [acetaminophen ].   Social History:  Social History   Tobacco Use   Smoking status: Never    Passive exposure: Past   Smokeless tobacco: Never  Vaping Use   Vaping status: Never Used  Substance Use Topics   Alcohol use: Never   Drug use: Never    Relationship status: married Patient lives with her daughter and son.   Patient is not employed. Regular exercise: No History of abuse: {Yes/No:304960894}  Family History:   Family History  Problem Relation Age of Onset   Diabetes Mother    Hypertension Mother    Heart Problems Mother    Congestive Heart Failure Mother    Diabetes Father    Hypertension Father    Psoriasis Father    Stroke Sister    Heart Problems Sister    Seizures  Sister    Prostate cancer Brother    Diabetes Brother    Asthma Daughter    Healthy Daughter    Healthy Son    Healthy Son      Review of Systems: Review of Systems  Constitutional:  Positive for malaise/fatigue. Negative for fever and weight loss.  Respiratory:  Negative for cough, shortness of breath and wheezing.   Cardiovascular:  Negative for chest pain, palpitations and leg swelling.  Gastrointestinal:  Positive for abdominal pain. Negative for blood in stool and constipation.  Genitourinary:  Positive for frequency (night time). Negative for dysuria, hematuria and urgency.       Bulge  Skin:  Negative for rash.  Neurological:  Positive for headaches. Negative for dizziness and weakness.  Endo/Heme/Allergies:  Bruises/bleeds easily.  Psychiatric/Behavioral:  Negative for depression. The patient is not nervous/anxious.      OBJECTIVE Physical Exam: There were no vitals filed for this visit.  Physical Exam Constitutional:      General: She is not in acute distress.    Appearance: Normal appearance.  Genitourinary:     Bladder and urethral meatus normal.     No lesions in the vagina.     Right Labia: No rash, tenderness, lesions, skin changes or Bartholin's cyst.     Left Labia: No tenderness, lesions, skin changes, Bartholin's cyst or rash.    No vaginal discharge, erythema, tenderness, bleeding, ulceration or granulation tissue.      Right Adnexa: not tender, not full and no mass present.    Left Adnexa: not tender, not full and no mass present.    Cervix is absent.     Uterus is absent.     Urethral meatus caruncle not present.    No urethral prolapse, tenderness, mass, hypermobility or discharge present.     Bladder is not tender, urgency on palpation not present and masses not present.      Levator ani not tender, obturator internus not tender, no asymmetrical contractions present and no pelvic spasms present.    Anal wink present and BC reflex present. Cardiovascular:     Rate and Rhythm: Normal rate.  Pulmonary:     Effort: Pulmonary effort is normal. No respiratory distress.  Abdominal:     General: There is no distension.     Palpations: There is no mass.     Tenderness: There is no abdominal tenderness.     Hernia: No hernia is present.  Neurological:     Mental Status: She is alert.  Vitals reviewed. Exam conducted with a chaperone present.      POP-Q:   POP-Q                                               Aa                                               Ba                                                 C  Gh                                               Pb                                               tvl                                                Ap                                               Bp                                                 D      Rectal Exam:  Normal sphincter tone, {rectocele:24766} distal rectocele, enterocoele {DESC; PRESENT/NOT PRESENT:21021351}, no rectal masses, {sign of:24767} dyssynergia when asking the patient to bear down.  Post-Void Residual (PVR) by Bladder Scan: In order to evaluate bladder emptying, we discussed obtaining  a postvoid residual and patient agreed to this procedure.  Procedure: The ultrasound unit was placed on the patient's abdomen in the suprapubic region after the patient had voided.      Laboratory Results: Lab Results  Component Value Date   COLORU Yellow 08/20/2020   CLARITYU Clear 08/20/2020   GLUCOSEUR Negative 08/20/2020   BILIRUBINUR NEGATIVE 12/02/2023   KETONESU Negative 08/20/2020   SPECGRAV >=1.030 (A) 08/20/2020   RBCUR Negative 08/20/2020   PHUR 5.0 08/20/2020   PROTEINUR 30 (A) 12/02/2023   UROBILINOGEN 0.2 12/02/2023   LEUKOCYTESUR NEGATIVE 12/02/2023    Lab Results  Component Value Date   CREATININE 0.81 07/08/2022   CREATININE 0.73 07/06/2022   CREATININE 0.79 03/26/2022    Lab Results  Component Value Date   HGBA1C 7.3 (H) 07/06/2022    Lab Results  Component Value Date   HGB 8.1 (L) 07/09/2022     ASSESSMENT AND PLAN Connie Hoffman is a 63 y.o. with: No diagnosis found.  There are no diagnoses linked to this encounter.   Connie ONEIDA Gillis, MD
